# Patient Record
Sex: Female | Born: 1945 | Race: White | Hispanic: No | Smoking: Current every day smoker
Health system: Southern US, Community
[De-identification: ages and names within clinical notes are randomized; demographics above are authoritative.]

## PROBLEM LIST (undated history)

## (undated) DIAGNOSIS — Z87891 Personal history of nicotine dependence: Secondary | ICD-10-CM

## (undated) DIAGNOSIS — Z860100 Personal history of colon polyps, unspecified: Secondary | ICD-10-CM

## (undated) DIAGNOSIS — J439 Emphysema, unspecified: Secondary | ICD-10-CM

## (undated) DIAGNOSIS — J40 Bronchitis, not specified as acute or chronic: Secondary | ICD-10-CM

## (undated) DIAGNOSIS — R920 Mammographic microcalcification found on diagnostic imaging of breast: Secondary | ICD-10-CM

## (undated) DIAGNOSIS — K279 Peptic ulcer, site unspecified, unspecified as acute or chronic, without hemorrhage or perforation: Secondary | ICD-10-CM

## (undated) DIAGNOSIS — C4491 Basal cell carcinoma of skin, unspecified: Secondary | ICD-10-CM

## (undated) DIAGNOSIS — Z9221 Personal history of antineoplastic chemotherapy: Secondary | ICD-10-CM

## (undated) DIAGNOSIS — IMO0002 Reserved for concepts with insufficient information to code with codable children: Secondary | ICD-10-CM

## (undated) DIAGNOSIS — J449 Chronic obstructive pulmonary disease, unspecified: Secondary | ICD-10-CM

## (undated) DIAGNOSIS — K621 Rectal polyp: Secondary | ICD-10-CM

## (undated) DIAGNOSIS — C50912 Malignant neoplasm of unspecified site of left female breast: Secondary | ICD-10-CM

## (undated) DIAGNOSIS — I714 Abdominal aortic aneurysm, without rupture, unspecified: Secondary | ICD-10-CM

## (undated) DIAGNOSIS — K219 Gastro-esophageal reflux disease without esophagitis: Secondary | ICD-10-CM

## (undated) DIAGNOSIS — R011 Cardiac murmur, unspecified: Secondary | ICD-10-CM

## (undated) DIAGNOSIS — R911 Solitary pulmonary nodule: Secondary | ICD-10-CM

## (undated) DIAGNOSIS — C50911 Malignant neoplasm of unspecified site of right female breast: Secondary | ICD-10-CM

## (undated) DIAGNOSIS — Z720 Tobacco use: Secondary | ICD-10-CM

## (undated) DIAGNOSIS — C3490 Malignant neoplasm of unspecified part of unspecified bronchus or lung: Secondary | ICD-10-CM

## (undated) DIAGNOSIS — M1990 Unspecified osteoarthritis, unspecified site: Secondary | ICD-10-CM

## (undated) DIAGNOSIS — L930 Discoid lupus erythematosus: Secondary | ICD-10-CM

## (undated) DIAGNOSIS — F410 Panic disorder [episodic paroxysmal anxiety] without agoraphobia: Secondary | ICD-10-CM

## (undated) DIAGNOSIS — R9431 Abnormal electrocardiogram [ECG] [EKG]: Secondary | ICD-10-CM

## (undated) DIAGNOSIS — E7800 Pure hypercholesterolemia, unspecified: Secondary | ICD-10-CM

## (undated) HISTORY — PX: COLONOSCOPY W/ BIOPSIES: SHX1374

## (undated) HISTORY — DX: Pure hypercholesterolemia, unspecified: E78.00

## (undated) HISTORY — DX: Personal history of nicotine dependence: Z87.891

## (undated) HISTORY — DX: Malignant neoplasm of unspecified part of unspecified bronchus or lung: C34.90

## (undated) HISTORY — DX: Reserved for concepts with insufficient information to code with codable children: IMO0002

## (undated) HISTORY — DX: Mammographic microcalcification found on diagnostic imaging of breast: R92.0

## (undated) HISTORY — DX: Tobacco use: Z72.0

## (undated) HISTORY — DX: Unspecified osteoarthritis, unspecified site: M19.90

## (undated) HISTORY — DX: Cardiac murmur, unspecified: R01.1

## (undated) HISTORY — DX: Solitary pulmonary nodule: R91.1

## (undated) HISTORY — DX: Basal cell carcinoma of skin, unspecified: C44.91

## (undated) HISTORY — PX: UPPER GI ENDOSCOPY: SHX6162

## (undated) HISTORY — DX: Abdominal aortic aneurysm, without rupture, unspecified: I71.40

## (undated) HISTORY — PX: MASTECTOMY: SHX3

## (undated) HISTORY — DX: Discoid lupus erythematosus: L93.0

## (undated) HISTORY — DX: Personal history of colonic polyps: Z86.010

## (undated) HISTORY — DX: Panic disorder (episodic paroxysmal anxiety): F41.0

## (undated) HISTORY — DX: Emphysema, unspecified: J43.9

## (undated) HISTORY — DX: Rectal polyp: K62.1

## (undated) HISTORY — DX: Personal history of colon polyps, unspecified: Z86.0100

## (undated) HISTORY — DX: Peptic ulcer, site unspecified, unspecified as acute or chronic, without hemorrhage or perforation: K27.9

## (undated) HISTORY — DX: Malignant neoplasm of unspecified site of right female breast: C50.911

## (undated) HISTORY — DX: Chronic obstructive pulmonary disease, unspecified: J44.9

## (undated) HISTORY — DX: Malignant neoplasm of unspecified site of left female breast: C50.912

---
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
## 1975-03-21 HISTORY — PX: VAGINAL HYSTERECTOMY: SUR661

## 1991-03-21 HISTORY — PX: SIMPLE MASTECTOMY: SHX2409

## 1992-03-20 DIAGNOSIS — Z9221 Personal history of antineoplastic chemotherapy: Secondary | ICD-10-CM

## 1992-03-20 DIAGNOSIS — C50911 Malignant neoplasm of unspecified site of right female breast: Secondary | ICD-10-CM

## 1992-03-20 HISTORY — DX: Malignant neoplasm of unspecified site of right female breast: C50.911

## 1992-03-20 HISTORY — DX: Personal history of antineoplastic chemotherapy: Z92.21

## 1994-03-20 HISTORY — PX: CARDIAC CATHETERIZATION: SHX172

## 1995-03-21 HISTORY — PX: TONSILLECTOMY: SUR1361

## 2003-03-21 DIAGNOSIS — R011 Cardiac murmur, unspecified: Secondary | ICD-10-CM

## 2003-03-21 DIAGNOSIS — M1990 Unspecified osteoarthritis, unspecified site: Secondary | ICD-10-CM

## 2003-03-21 DIAGNOSIS — L930 Discoid lupus erythematosus: Secondary | ICD-10-CM

## 2003-03-21 HISTORY — DX: Discoid lupus erythematosus: L93.0

## 2003-03-21 HISTORY — DX: Unspecified osteoarthritis, unspecified site: M19.90

## 2003-03-21 HISTORY — DX: Cardiac murmur, unspecified: R01.1

## 2004-02-01 ENCOUNTER — Ambulatory Visit: Payer: Self-pay | Admitting: General Surgery

## 2004-02-29 ENCOUNTER — Ambulatory Visit: Payer: Self-pay | Admitting: General Surgery

## 2004-07-04 ENCOUNTER — Ambulatory Visit: Payer: Self-pay | Admitting: General Surgery

## 2005-02-22 ENCOUNTER — Ambulatory Visit: Payer: Self-pay | Admitting: Internal Medicine

## 2005-07-24 ENCOUNTER — Ambulatory Visit: Payer: Self-pay | Admitting: General Surgery

## 2006-03-20 DIAGNOSIS — K621 Rectal polyp: Secondary | ICD-10-CM

## 2006-03-20 HISTORY — DX: Rectal polyp: K62.1

## 2006-09-19 ENCOUNTER — Ambulatory Visit: Payer: Self-pay | Admitting: General Surgery

## 2006-10-19 ENCOUNTER — Ambulatory Visit: Payer: Self-pay | Admitting: Oncology

## 2006-11-09 ENCOUNTER — Ambulatory Visit: Payer: Self-pay | Admitting: Oncology

## 2006-11-19 ENCOUNTER — Ambulatory Visit: Payer: Self-pay | Admitting: Oncology

## 2007-04-04 ENCOUNTER — Ambulatory Visit: Payer: Self-pay | Admitting: General Surgery

## 2007-10-30 ENCOUNTER — Ambulatory Visit: Payer: Self-pay | Admitting: General Surgery

## 2007-11-08 ENCOUNTER — Ambulatory Visit: Payer: Self-pay | Admitting: Oncology

## 2007-11-19 ENCOUNTER — Ambulatory Visit: Payer: Self-pay | Admitting: Oncology

## 2008-11-03 ENCOUNTER — Ambulatory Visit: Payer: Self-pay | Admitting: General Surgery

## 2008-11-17 ENCOUNTER — Ambulatory Visit: Payer: Self-pay | Admitting: General Surgery

## 2009-03-20 DIAGNOSIS — C50912 Malignant neoplasm of unspecified site of left female breast: Secondary | ICD-10-CM

## 2009-03-20 DIAGNOSIS — R920 Mammographic microcalcification found on diagnostic imaging of breast: Secondary | ICD-10-CM

## 2009-03-20 HISTORY — DX: Mammographic microcalcification found on diagnostic imaging of breast: R92.0

## 2009-03-20 HISTORY — PX: SIMPLE MASTECTOMY: SHX2409

## 2009-03-20 HISTORY — DX: Malignant neoplasm of unspecified site of left female breast: C50.912

## 2009-03-20 HISTORY — PX: APPENDECTOMY: SHX54

## 2009-04-16 ENCOUNTER — Ambulatory Visit: Payer: Self-pay | Admitting: General Practice

## 2009-06-24 LAB — PULMONARY FUNCTION TEST

## 2009-12-09 ENCOUNTER — Ambulatory Visit: Payer: Self-pay | Admitting: General Surgery

## 2009-12-20 ENCOUNTER — Ambulatory Visit: Payer: Self-pay | Admitting: General Surgery

## 2009-12-27 HISTORY — PX: BREAST BIOPSY: SHX20

## 2010-01-17 ENCOUNTER — Ambulatory Visit: Payer: Self-pay | Admitting: General Surgery

## 2010-01-24 LAB — PATHOLOGY REPORT

## 2010-01-31 ENCOUNTER — Ambulatory Visit: Payer: Self-pay | Admitting: General Surgery

## 2010-02-07 ENCOUNTER — Ambulatory Visit: Payer: Self-pay | Admitting: General Surgery

## 2010-03-20 DIAGNOSIS — E7800 Pure hypercholesterolemia, unspecified: Secondary | ICD-10-CM

## 2010-03-20 HISTORY — DX: Pure hypercholesterolemia, unspecified: E78.00

## 2010-08-26 ENCOUNTER — Ambulatory Visit: Payer: Self-pay | Admitting: General Surgery

## 2010-08-30 LAB — PATHOLOGY REPORT

## 2012-03-28 ENCOUNTER — Encounter: Payer: Self-pay | Admitting: General Surgery

## 2012-03-29 DIAGNOSIS — C50919 Malignant neoplasm of unspecified site of unspecified female breast: Secondary | ICD-10-CM

## 2012-03-29 DIAGNOSIS — Z86010 Personal history of colonic polyps: Secondary | ICD-10-CM

## 2012-03-29 DIAGNOSIS — Z85828 Personal history of other malignant neoplasm of skin: Secondary | ICD-10-CM | POA: Insufficient documentation

## 2012-04-02 ENCOUNTER — Ambulatory Visit: Payer: Self-pay | Admitting: General Surgery

## 2012-04-03 DIAGNOSIS — Z853 Personal history of malignant neoplasm of breast: Secondary | ICD-10-CM

## 2012-04-16 ENCOUNTER — Ambulatory Visit: Payer: Self-pay | Admitting: General Surgery

## 2012-09-03 ENCOUNTER — Encounter: Payer: Self-pay | Admitting: *Deleted

## 2013-02-05 ENCOUNTER — Ambulatory Visit: Payer: Self-pay | Admitting: General Surgery

## 2013-03-20 DIAGNOSIS — C4491 Basal cell carcinoma of skin, unspecified: Secondary | ICD-10-CM

## 2013-03-20 DIAGNOSIS — Z85828 Personal history of other malignant neoplasm of skin: Secondary | ICD-10-CM

## 2013-03-20 HISTORY — DX: Basal cell carcinoma of skin, unspecified: C44.91

## 2013-03-20 HISTORY — DX: Personal history of other malignant neoplasm of skin: Z85.828

## 2013-03-26 ENCOUNTER — Encounter: Payer: Self-pay | Admitting: *Deleted

## 2013-04-16 ENCOUNTER — Ambulatory Visit (INDEPENDENT_AMBULATORY_CARE_PROVIDER_SITE_OTHER): Payer: No Typology Code available for payment source | Admitting: Adult Health

## 2013-04-16 ENCOUNTER — Encounter (INDEPENDENT_AMBULATORY_CARE_PROVIDER_SITE_OTHER): Payer: Self-pay

## 2013-04-16 ENCOUNTER — Encounter: Payer: Self-pay | Admitting: Adult Health

## 2013-04-16 VITALS — BP 110/62 | HR 74 | Temp 97.9°F | Resp 12 | Ht 64.5 in | Wt 145.5 lb

## 2013-04-16 DIAGNOSIS — M329 Systemic lupus erythematosus, unspecified: Secondary | ICD-10-CM | POA: Insufficient documentation

## 2013-04-16 DIAGNOSIS — F411 Generalized anxiety disorder: Secondary | ICD-10-CM | POA: Insufficient documentation

## 2013-04-16 NOTE — Assessment & Plan Note (Signed)
Uses Xanax as needed. She currently does not need any refills. Patient has agreed to be evaluated every 6 months or sooner for her anxiety.

## 2013-04-16 NOTE — Assessment & Plan Note (Addendum)
Reports followed by Dr. 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 Request records.

## 2013-04-16 NOTE — Patient Instructions (Signed)
   Thank you for choosing [facility] for your health care needs.  I will request your records from your previous doctors.  Remember to let your pharmacy know to send me any request for refills.  Call if you have any concerns.

## 2013-04-16 NOTE — Progress Notes (Signed)
Subjective:    Patient ID: Maureen [name] Hidden, female    DOB: Oct 22, 1945, 68 y.o.   MRN: [id number]  HPI  Patient is a 68 year old female who presents to clinic to establish care. Previously followed by Dr. Arline Asp and then saw Dr. Loney Hering for short periods of time. Has had PFT with Dr. Raul Del. Followed by Dr. Evorn Gong for Lupus. Hx of bilateral breast cancer s/p sgy by Dr. Bary Castilla. Request records from non /[facility] providers. She reports ongoing problems with anxiety although these are well controlled. Has been on xanax > 20 years. Takes only as needed. Feeling well overall. Had physical exam ~ 4-5 month ago.    Past Medical History  Diagnosis Date  . Lupus 2005  . Personal history of tobacco use, presenting hazards to health   . Heart murmur 2005  . Arthritis 2005  . Rectal polyp 2008    villous adenoma  . Mammographic microcalcification 2011  . Hypercholesterolemia 2012  . 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 This was a histologic grade 2 tumor measuring just under 1 cm in diameter. No invasive cancer was identified. 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  . Personal history of colonic polyps     Colon polyps  . Ulcer   . Panic attack      Past Surgical History  Procedure Laterality Date  . Colonoscopy w/ biopsies  3299,2426    Dr. Bary Castilla  . Breast biopsy  12/27/2009    right breast  . Upper gi endoscopy   2012    Upper endoscopy completed in 2012 showed mild reflux changes without evidence of Barrett's epithelial changes  . Tonsillectomy  1997  . Vaginal hysterectomy  1977  . Cardiac catheterization  1994  . Simple mastectomy  1993    right  . Simple mastectomy  2011    left  . Appendectomy  2011     Family History  Problem Relation Age of Onset  . Cancer Maternal  Aunt     breast cancer  . Cancer Maternal Grandmother     breast cancer     History   Social History  . Marital Status: Legally Separated    Spouse Name: N/A    Number of Children: N/A  . Years of Education: N/A   Occupational History  . [employer]   Social History Main Topics  . Smoking status: Current Every Day Smoker -- 1.00 packs/day for 40 years    Types: Cigarettes  . Smokeless tobacco: Never Used  . Alcohol Use: .5 - 1 oz/week    1-2 drink(s) per week  . Drug Use: No  . Sexual Activity: Not on file   Other Topics Concern  . Not on file   Social History Narrative   [name] grew up in [place]. She has two adult daughters. She lives in her home. Her youngest daughter and grandchildren live with her. She has two dogs. 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 She loves watching her grandson play basketball. She also loves gardening and being outdoors and working in the yard. She is getting ready to build a deck on her home.          Review of Systems  Constitutional:  Negative.   HENT: Negative.   Eyes: Negative.   Respiratory: Negative.   Cardiovascular: Negative.   Gastrointestinal: Negative.   Endocrine: Negative.   Genitourinary: Negative.   Musculoskeletal: Negative.   Skin: Negative.   Allergic/Immunologic: Negative.   Neurological: Negative.   Hematological: Negative.   Psychiatric/Behavioral: Negative.        Objective:   Physical Exam  Constitutional: She is oriented to person, place, and time. She appears well-developed and well-nourished. No distress.  HENT:  Head: Normocephalic and atraumatic.  Right Ear: External ear normal.  Left Ear: External ear normal.  Nose: Nose normal.  Mouth/Throat: Oropharynx is clear and moist.  Eyes: Conjunctivae and EOM are normal. Pupils are equal, round, and reactive to light.  Neck: Normal range of motion. Neck supple. No tracheal  deviation present. No thyromegaly present.  Cardiovascular: Normal rate, regular rhythm, normal heart sounds and intact distal pulses.  Exam reveals no gallop and no friction rub.   No murmur heard. Pulmonary/Chest: Effort normal and breath sounds normal. No respiratory distress. She has no wheezes. She has no rales.  Abdominal: Soft. Bowel sounds are normal. She exhibits no distension and no mass. There is no tenderness. There is no rebound and no guarding.  Musculoskeletal: Normal range of motion. She exhibits no edema and no tenderness.  Lymphadenopathy:    She has no cervical adenopathy.  Neurological: She is alert and oriented to person, place, and time. She has normal reflexes. No cranial nerve deficit. Coordination normal.  Skin: Skin is warm and dry.  Psychiatric: She has a normal mood and affect. Her behavior is normal. Judgment and thought content normal.    BP 110/62  Pulse 74  Temp(Src) 97.9 F (36.6 C) (Oral)  Resp 12  Ht 5' 4.5" (1.638 m)  Wt 145 lb 8 oz (65.998 kg)  BMI 24.60 kg/m2  SpO2 95%      Assessment & Plan:

## 2013-04-16 NOTE — Progress Notes (Signed)
Pre visit review using our clinic review tool, if applicable. No additional management support is needed unless otherwise documented below in the visit note. 

## 2013-04-17 ENCOUNTER — Telehealth: Payer: Self-pay | Admitting: Internal Medicine

## 2013-04-17 NOTE — Telephone Encounter (Signed)
Relevant patient education assigned to patient using Emmi. ° °

## 2013-04-21 ENCOUNTER — Encounter: Payer: Self-pay | Admitting: Adult Health

## 2013-06-09 ENCOUNTER — Encounter: Payer: Self-pay | Admitting: General Surgery

## 2013-06-09 ENCOUNTER — Ambulatory Visit (INDEPENDENT_AMBULATORY_CARE_PROVIDER_SITE_OTHER): Payer: No Typology Code available for payment source | Admitting: General Surgery

## 2013-06-09 VITALS — BP 136/72 | HR 80 | Resp 12 | Ht 64.0 in | Wt 143.0 lb

## 2013-06-09 DIAGNOSIS — Z853 Personal history of malignant neoplasm of breast: Secondary | ICD-10-CM

## 2013-06-09 NOTE — Progress Notes (Signed)
Patient ID: Maureen [name], female   DOB: 1945-07-09, 68 y.o.   MRN: [id number]  Chief Complaint  Patient presents with  . Follow-up    one year history of breast cancer    HPI Maureen [name] is a 68 y.o. female. Here today for her annual followup of her breast cancer. Patient states she is doing well. No new breast problems at this time.  HPI  Past Medical History  Diagnosis Date  . Discoid lupus 2005  . Personal history of tobacco use, presenting hazards to health   . Heart murmur 2005  . Arthritis 2005  . Rectal polyp 2008    villous adenoma; last colonoscopy 1/09 WNL  . Mammographic microcalcification 2011  . Hypercholesterolemia 2012  . 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 This was a histologic grade 2 tumor measuring just under 1 cm in diameter. No invasive cancer was identified. 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  . Personal history of colonic polyps     Colon polyps  . Ulcer   . Panic attack   . PUD (peptic ulcer disease)     Last EGD 1/09    Past Surgical History  Procedure Laterality Date  . Colonoscopy w/ biopsies  7867,6720    Dr. Bary Castilla  . Breast biopsy  12/27/2009    right breast  . Upper gi endoscopy   2012    Upper endoscopy completed in 2012 showed mild reflux changes without evidence of Barrett's epithelial changes  . Tonsillectomy  1997  . Vaginal hysterectomy  1977  . Cardiac catheterization  1994  . Simple mastectomy  1993    right  . Simple mastectomy  2011    left  . Appendectomy  2011    Family History  Problem Relation Age of Onset  . Cancer Maternal Aunt     breast cancer  . Cancer Maternal Grandmother     breast cancer    Social History History  Substance Use Topics  . Smoking status: Current Every Day Smoker -- 1.00  packs/day for 40 years    Types: Cigarettes  . Smokeless tobacco: Never Used  . Alcohol Use: .5 - 1 oz/week    1-2 drink(s) per week    Allergies  Allergen Reactions  . Codeine Itching  . Sulfa Antibiotics Hives  . Sulfinpyrazone   . Erythromycin Rash    Current Outpatient Prescriptions  Medication Sig Dispense Refill  . albuterol (VENTOLIN HFA) 108 (90 BASE) MCG/ACT inhaler Inhale 2 puffs into the lungs every 6 (six) hours as needed.      . ALPRAZolam (XANAX) 0.5 MG tablet Take 0.5 mg by mouth 3 (three) times daily as needed.      . budesonide-formoterol (SYMBICORT) 160-4.5 MCG/ACT inhaler Inhale 2 puffs into the lungs 2 (two) times daily.      . clobetasol cream (TEMOVATE) 9.47 % Apply 1 application topically daily.      . fluticasone (FLONASE) 50 MCG/ACT nasal spray Place 2 sprays into both nostrils daily.      . Homeopathic Products (ZICAM ALLERGY RELIEF NA) Place into the nose.      Marland Kitchen omeprazole (PRILOSEC) 20 MG capsule Take 20 mg by mouth daily.        No current facility-administered medications for this visit.  Review of Systems Review of Systems  Constitutional: Negative.   Respiratory: Negative.   Cardiovascular: Negative.     Blood pressure 136/72, pulse 80, resp. rate 12, height 5\' 4"  (1.626 m), weight 143 lb (64.864 kg).  Physical Exam Physical Exam  Constitutional: She is oriented to person, place, and time. She appears well-developed and well-nourished.  Eyes: Conjunctivae are normal.  Neck: Neck supple.  Cardiovascular: Normal rate, regular rhythm and normal heart sounds.   Pulmonary/Chest: Effort normal and breath sounds normal.  Bilateral mastectomy sites well healed.   Neurological: She is alert and oriented to person, place, and time.  Skin: Skin is warm and dry.    Data Reviewed 2009 upper endoscopy and colonoscopy reports. January 2014 abdominal ultrasound.  Assessment    Benign chest wall exam. No evidence of recurrent disease.      Plan    Wall history of intermittent upper abdominal pain with negative evaluations. The patient had requested a prescription for paregoric. I did not see any evidence that this was indicated.  She has been asked to return in one year for repeat exam.   The patient had an ER/PR-positive DCIS, but I should undergone bilateral mastectomies there was not felt to be any benefit from adjuvant hormonal therapy.        Maureen [name] 06/10/2013, 6:38 AM

## 2013-06-09 NOTE — Patient Instructions (Signed)
Patient to return in one year. 

## 2013-06-20 ENCOUNTER — Other Ambulatory Visit: Payer: Self-pay | Admitting: Adult Health

## 2013-06-23 NOTE — Telephone Encounter (Signed)
Faxed Rx to pharmacy  

## 2013-07-16 ENCOUNTER — Ambulatory Visit (INDEPENDENT_AMBULATORY_CARE_PROVIDER_SITE_OTHER): Payer: No Typology Code available for payment source | Admitting: Adult Health

## 2013-07-16 ENCOUNTER — Encounter: Payer: Self-pay | Admitting: Adult Health

## 2013-07-16 ENCOUNTER — Encounter (INDEPENDENT_AMBULATORY_CARE_PROVIDER_SITE_OTHER): Payer: Self-pay

## 2013-07-16 VITALS — BP 128/70 | HR 76 | Temp 97.9°F | Resp 12 | Wt 145.8 lb

## 2013-07-16 DIAGNOSIS — F17200 Nicotine dependence, unspecified, uncomplicated: Secondary | ICD-10-CM

## 2013-07-16 DIAGNOSIS — F411 Generalized anxiety disorder: Secondary | ICD-10-CM

## 2013-07-16 DIAGNOSIS — Z7189 Other specified counseling: Secondary | ICD-10-CM

## 2013-07-16 DIAGNOSIS — Z716 Tobacco abuse counseling: Secondary | ICD-10-CM

## 2013-07-16 NOTE — Progress Notes (Signed)
Pre visit review using our clinic review tool, if applicable. No additional management support is needed unless otherwise documented below in the visit note. 

## 2013-07-16 NOTE — Progress Notes (Signed)
   Subjective:    Patient ID: Maureen [name], female    DOB: 07-13-1945, 68 y.o.   MRN: [id number]  HPI Pt is a pleasant 68 y/o female who presents for follow up of anxiety, ongoing tobacco abuse. Pertaining to her anxiety, she reports that she is well controlled with the xanax. Takes medication as needed tid. Pertaining to her tobacco abuse, pt is smoking < 1 ppd. She has been to a hypnotist in the past and quit smoking; however, she was supposed to return but did not. She began smoking again. Plans on going back to see the hypnotist.  Medicare wellness exam due in October. Prescription provided for labs prior to her physical. She will schedule appointment prior to leaving the office.  Past Medical History  Diagnosis Date  . Discoid lupus 2005  . Personal history of tobacco use, presenting hazards to health   . Heart murmur 2005  . Arthritis 2005  . Rectal polyp 2008    villous adenoma; last colonoscopy 1/09 WNL  . Mammographic microcalcification 2011  . Hypercholesterolemia 2012  . 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 This was a histologic grade 2 tumor measuring just under 1 cm in diameter. No invasive cancer was identified. 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  . Personal history of colonic polyps     Colon polyps  . Ulcer   . Panic attack   . PUD (peptic ulcer disease)     Last EGD 1/09    Review of Systems  Constitutional: Negative.   HENT: Negative.   Eyes: Negative.   Respiratory: Negative.   Cardiovascular: Negative.   Gastrointestinal: Negative.   Endocrine: Negative.   Genitourinary: Negative.   Musculoskeletal: Negative.   Skin: Negative.   Allergic/Immunologic: Negative.   Neurological: Negative.   Hematological: Negative.   Psychiatric/Behavioral: Negative.   Nervous/anxious: controlled with xanax prn.        Objective:   Physical Exam  Constitutional: She is oriented to person, place, and time. No distress.  HENT:  Head: Normocephalic and atraumatic.  Eyes: Conjunctivae and EOM are normal.  Neck: Normal range of motion. Neck supple.  Cardiovascular: Normal rate, regular rhythm, normal heart sounds and intact distal pulses.  Exam reveals no gallop and no friction rub.   No murmur heard. Pulmonary/Chest: Effort normal and breath sounds normal. No respiratory distress. She has no wheezes. She has no rales.  Musculoskeletal: Normal range of motion.  Neurological: She is alert and oriented to person, place, and time. She has normal reflexes. Coordination normal.  Skin: Skin is warm and dry.  Psychiatric: She has a normal mood and affect. Her behavior is normal. Judgment and thought content normal.       Assessment & Plan:   1. Generalized anxiety disorder Well controlled with Xanax. She takes this 3 times a day as needed. Reports that some days she only needs one. No refills required at this time. Continue to follow  2. Tobacco abuse counseling Patient is smoking less than one pack per day. She plans on seeing a hypnotist to help her quit. She has done this in the past although she did not return for followup. Encouraged her to quit smoking.

## 2013-07-16 NOTE — Patient Instructions (Signed)
  Please return for your yearly physical in October. I have provided you a prescription so that you can have labs drawn prior to coming for your physical exam.  I encourage you to quit smoking as this will improve her overall health.

## 2013-08-13 ENCOUNTER — Encounter: Payer: Self-pay | Admitting: Adult Health

## 2014-01-02 LAB — CBC AND DIFFERENTIAL
HCT: 41 % (ref 36–46)
Hemoglobin: 13.6 g/dL (ref 12.0–16.0)
NEUTROS ABS: 3 /uL
Platelets: 184 10*3/uL (ref 150–399)
WBC: 5.7 10^3/mL

## 2014-01-02 LAB — HEPATIC FUNCTION PANEL
ALT: 27 U/L (ref 7–35)
AST: 17 U/L (ref 13–35)
Alkaline Phosphatase: 102 U/L (ref 25–125)
Bilirubin, Total: 0.3 mg/dL

## 2014-01-02 LAB — LIPID PANEL
Cholesterol: 253 mg/dL — AB (ref 0–200)
HDL: 43 mg/dL (ref 35–70)
LDL CALC: 177 mg/dL
TRIGLYCERIDES: 163 mg/dL — AB (ref 40–160)

## 2014-01-02 LAB — BASIC METABOLIC PANEL
BUN: 13 mg/dL (ref 4–21)
Creatinine: 1 mg/dL (ref 0.5–1.1)
Glucose: 92 mg/dL
POTASSIUM: 4.3 mmol/L (ref 3.4–5.3)
SODIUM: 140 mmol/L (ref 137–147)

## 2014-01-02 LAB — TSH: TSH: 1.07 u[IU]/mL (ref 0.41–5.90)

## 2014-01-05 ENCOUNTER — Telehealth: Payer: Self-pay | Admitting: Internal Medicine

## 2014-01-05 NOTE — Telephone Encounter (Signed)
Recent labs show elevated cholesterol. She will need a 53min follow up visit to establish care and address this.

## 2014-01-06 NOTE — Telephone Encounter (Signed)
Left vm for pt to return my call.  

## 2014-01-19 ENCOUNTER — Encounter: Payer: No Typology Code available for payment source | Admitting: Adult Health

## 2014-01-19 ENCOUNTER — Encounter: Payer: Self-pay | Admitting: Adult Health

## 2014-01-27 ENCOUNTER — Encounter: Payer: Self-pay | Admitting: *Deleted

## 2014-01-27 NOTE — Telephone Encounter (Signed)
Unable to reach patient, mailed letter to call office to schedule appt with new NP to discuss labs.

## 2014-02-10 ENCOUNTER — Telehealth: Payer: Self-pay | Admitting: *Deleted

## 2014-02-10 ENCOUNTER — Other Ambulatory Visit: Payer: Self-pay | Admitting: *Deleted

## 2014-02-10 MED ORDER — ALPRAZOLAM 0.5 MG PO TABS: 0.5000 mg | ORAL_TABLET | Freq: Three times a day (TID) | ORAL | Status: DC | PRN

## 2014-02-10 NOTE — Telephone Encounter (Signed)
Patient notified as requested. 

## 2014-02-10 NOTE — Telephone Encounter (Signed)
Refill one 30 days only.  Needs OV with carrie prior to more refills

## 2014-02-10 NOTE — Telephone Encounter (Signed)
Refill request former Raquel pt  Last fill: 09.19.2015 Alprazolam 0.5mg  tablet

## 2014-02-10 NOTE — Telephone Encounter (Signed)
Read below

## 2014-02-18 ENCOUNTER — Encounter: Payer: Self-pay | Admitting: Nurse Practitioner

## 2014-02-18 ENCOUNTER — Ambulatory Visit (INDEPENDENT_AMBULATORY_CARE_PROVIDER_SITE_OTHER): Payer: No Typology Code available for payment source | Admitting: Nurse Practitioner

## 2014-02-18 VITALS — BP 112/62 | HR 88 | Temp 97.9°F | Resp 14 | Ht 63.5 in | Wt 147.2 lb

## 2014-02-18 DIAGNOSIS — Z0000 Encounter for general adult medical examination without abnormal findings: Secondary | ICD-10-CM

## 2014-02-18 DIAGNOSIS — Z23 Encounter for immunization: Secondary | ICD-10-CM

## 2014-02-18 NOTE — Assessment & Plan Note (Signed)
Gave pneumococcal vaccination- 13 valent at today's visit. This was a [employer] visit as requested by Charolette Forward, NP. Other items on list were up to date. We discussed specifically low cholesterol diet, smoking cessation, re-checking Vitamin D and Lipids again in Jan. She will follow up for anxiety, lipids, and vitamin D levels in Jan.

## 2014-02-18 NOTE — Patient Instructions (Addendum)
We will follow up on your Vitamin D Level in Jan. As well as your cholesterol.  Please see below for a cholesterol diet and we will see if this is helpful and we can stay off medication.  Follow up in January.   Fat and Cholesterol Control Diet Fat and cholesterol levels in your blood and organs are influenced by your diet. High levels of fat and cholesterol may lead to diseases of the heart, small and large blood vessels, gallbladder, liver, and pancreas. CONTROLLING FAT AND CHOLESTEROL WITH DIET Although exercise and lifestyle factors are important, your diet is key. That is because certain foods are known to raise cholesterol and others to lower it. The goal is to balance foods for their effect on cholesterol and more importantly, to replace saturated and trans fat with other types of fat, such as monounsaturated fat, polyunsaturated fat, and omega-3 fatty acids. On average, a person should consume no more than 15 to 17 g of saturated fat daily. Saturated and trans fats are considered "bad" fats, and they will raise LDL cholesterol. Saturated fats are primarily found in animal products such as meats, butter, and cream. However, that does not mean you need to give up all your favorite foods. Today, there are good tasting, low-fat, low-cholesterol substitutes for most of the things you like to eat. Choose low-fat or nonfat alternatives. Choose round or loin cuts of red meat. These types of cuts are lowest in fat and cholesterol. Chicken (without the skin), fish, veal, and ground [place] breast are great choices. Eliminate fatty meats, such as hot dogs and salami. Even shellfish have little or no saturated fat. Have a 3 oz (85 g) portion when you eat lean meat, poultry, or fish. Trans fats are also called "partially hydrogenated oils." They are oils that have been scientifically manipulated so that they are solid at room temperature resulting in a longer shelf life and improved taste and texture of foods in  which they are added. Trans fats are found in stick margarine, some tub margarines, cookies, crackers, and baked goods.  When baking and cooking, oils are a great substitute for butter. The monounsaturated oils are especially beneficial since it is believed they lower LDL and raise HDL. The oils you should avoid entirely are saturated tropical oils, such as coconut and palm.  Remember to eat a lot from food groups that are naturally free of saturated and trans fat, including fish, fruit, vegetables, beans, grains (barley, rice, couscous, bulgur wheat), and pasta (without cream sauces).  IDENTIFYING FOODS THAT LOWER FAT AND CHOLESTEROL  Soluble fiber may lower your cholesterol. This type of fiber is found in fruits such as apples, vegetables such as broccoli, potatoes, and carrots, legumes such as beans, peas, and lentils, and grains such as barley. Foods fortified with plant sterols (phytosterol) may also lower cholesterol. You should eat at least 2 g per day of these foods for a cholesterol lowering effect.  Read package labels to identify low-saturated fats, trans fat free, and low-fat foods at the supermarket. Select cheeses that have only 2 to 3 g saturated fat per ounce. Use a heart-healthy tub margarine that is free of trans fats or partially hydrogenated oil. When buying baked goods (cookies, crackers), avoid partially hydrogenated oils. Breads and muffins should be made from whole grains (whole-wheat or whole oat flour, instead of "flour" or "enriched flour"). Buy non-creamy canned soups with reduced salt and no added fats.  FOOD PREPARATION TECHNIQUES  Never deep-fry. If you must  fry, either stir-fry, which uses very little fat, or use non-stick cooking sprays. When possible, broil, bake, or roast meats, and steam vegetables. Instead of putting butter or margarine on vegetables, use lemon and herbs, applesauce, and cinnamon (for squash and sweet potatoes). Use nonfat yogurt, salsa, and low-fat  dressings for salads.  LOW-SATURATED FAT / LOW-FAT FOOD SUBSTITUTES Meats / Saturated Fat (g)  Avoid: Steak, marbled (3 oz/85 g) / 11 g  Choose: Steak, lean (3 oz/85 g) / 4 g  Avoid: Hamburger (3 oz/85 g) / 7 g  Choose: Hamburger, lean (3 oz/85 g) / 5 g  Avoid: Ham (3 oz/85 g) / 6 g  Choose: Ham, lean cut (3 oz/85 g) / 2.4 g  Avoid: Chicken, with skin, dark meat (3 oz/85 g) / 4 g  Choose: Chicken, skin removed, dark meat (3 oz/85 g) / 2 g  Avoid: Chicken, with skin, light meat (3 oz/85 g) / 2.5 g  Choose: Chicken, skin removed, light meat (3 oz/85 g) / 1 g Dairy / Saturated Fat (g)  Avoid: Whole milk (1 cup) / 5 g  Choose: Low-fat milk, 2% (1 cup) / 3 g  Choose: Low-fat milk, 1% (1 cup) / 1.5 g  Choose: Skim milk (1 cup) / 0.3 g  Avoid: Hard cheese (1 oz/28 g) / 6 g  Choose: Skim milk cheese (1 oz/28 g) / 2 to 3 g  Avoid: Cottage cheese, 4% fat (1 cup) / 6.5 g  Choose: Low-fat cottage cheese, 1% fat (1 cup) / 1.5 g  Avoid: Ice cream (1 cup) / 9 g  Choose: Sherbet (1 cup) / 2.5 g  Choose: Nonfat frozen yogurt (1 cup) / 0.3 g  Choose: Frozen fruit bar / trace  Avoid: Whipped cream (1 tbs) / 3.5 g  Choose: Nondairy whipped topping (1 tbs) / 1 g Condiments / Saturated Fat (g)  Avoid: Mayonnaise (1 tbs) / 2 g  Choose: Low-fat mayonnaise (1 tbs) / 1 g  Avoid: Butter (1 tbs) / 7 g  Choose: Extra light margarine (1 tbs) / 1 g  Avoid: Coconut oil (1 tbs) / 11.8 g  Choose: Olive oil (1 tbs) / 1.8 g  Choose: Corn oil (1 tbs) / 1.7 g  Choose: Safflower oil (1 tbs) / 1.2 g  Choose: Sunflower oil (1 tbs) / 1.4 g  Choose: Soybean oil (1 tbs) / 2.4 g  Choose: Canola oil (1 tbs) / 1 g Document Released: 03/06/2005 Document Revised: 07/01/2012 Document Reviewed: 06/04/2013 [employer] Patient Information 2015 [place], [place]. This information is not intended to replace advice given to you by your health care provider. Make sure you discuss any questions you have  with your health care provider.

## 2014-02-18 NOTE — Progress Notes (Signed)
Subjective:    Patient ID: Maureen [name], female    DOB: 1945/09/15, 68 y.o.   MRN: [id number]  HPI  Maureen [name] is a former Charolette Forward, NP patient here for her [employer] Visit.  Health Screenings  Mammogram- Bilateral Mastectomy - 1993 and 2011 Sees Dr. Bary Castilla Pap Smear- No exam since 2000 Colonoscopy- Dr. Bary Castilla- endoscopy due this year. Colonoscopy up to date. PSA- N/A Bone Density- Dr. Arline Asp - few years ago, good report Glaucoma- Appointment in Jan. Denies glaucoma.  Hearing - No testing needed at this time.  Hemoglobin A1C- Unsure if done with labs Cholesterol- High cholesterol. Can not see lab results. Will advise on diet and re-check in January.  Vitamin D- lab shows low vitamin D. 2,000 mg daily every 2-3 days. Re-check in Jan. 2016.   Social  Alcohol intake- once a month. Usually one beer Smoking history- 1 ppd x 50 yrs Smokers in home? - Daughter Illicit drug use? Denies Exercise- Denies Diet- No specific diet. Cooks at home often Sexually Active- Not since husband passed in 2000 Multiple Partners- N/A  Safety  Patient feesl safe at home. - Yes Patient does have smoke detectors at home- Each room and functional Patient does wear sunscreen or protective clothing when in direct sunlight- Does after Basal cell Cancer removed from face by Dr. Marsa Aris at [facility] in Sept.  Patient does wear seat belt when driving or riding with others.- *Most of the time. Due to Mastectomies there are moments when it is uncomfortable. Discussed having modification to seat belt to be able to comply with safety standards.   Activities of Daily Living Patient can do their own household chores. Denies needing assistance with: driving, feeding themselves, getting from bed to chair, getting to the toilet, bathing/showering, dressing, managing money, climbing flight of stairs, or preparing meals.   Depression Screen Patient denies losing interest in daily life, feeling hopeless,  or crying easily over simple problems.  Fall Screen Patient denies being afraid of falling or falling in the last year.  Memory Screen Patient denies problems with memory, misplacing items, and is able to balance checkbook/bank accounts.  Patient is alert, normal appearance, oriented to person/place/and time. Correctly identified the president of the [place], recall of 3 objects, and performing simple calculations.  Patient displays appropriate judgement and can read correct time from watch face.   Immunizations The following Immunizations are up to date: Influenza, pneumonia, and tetanus.   Other Providers Include: Dr. Evorn Gong Dermatologist- Lupus and other skin issues Dr. Marsa Aris with [facility] Removed facial basal cell right side of nose in Sept. Will see in him March for follow up.  All blood work done through [employer].    Review of Systems No ROS- Annual Wellness Visit    Objective:   Physical Exam No PE- Annual Wellness Visit.      Assessment & Plan:  This is a routine wellness examination for this patient.  I reviewed all health maintenance protocols including mammography, colonoscopy, bone density. Needed referrals placed. Age and diagnosis appropriate screening labs were ordered.  Her immunization history was reviewed and appropriate vaccinations were ordered.  Her current medications and allergies were reviewed and needed refills of her chronic medications were ordered if needed.  The plan for yearly health maintenance was discussed.     MEDICARE ATTESTATION I have personally reviewed:  The patient's medical and social history. The use of alcohol, tobacco and illicit drugs. The current medications and supplements. The patient's  function ability including ADLs, fall risks, home safety risks, cognitive, and hearing and visual impairment.   Diet and physical activities. Evaluation for depression and mood disorders.    The patient's weight, height, BMI and visual acuity have  been recorded in the chart.  I have made referrals, counseled and provided education to the patient based on review of the above.

## 2014-02-18 NOTE — Progress Notes (Signed)
Pre visit review using our clinic review tool, if applicable. No additional management support is needed unless otherwise documented below in the visit note. 

## 2014-02-19 ENCOUNTER — Encounter: Payer: Self-pay | Admitting: Adult Health

## 2014-02-24 ENCOUNTER — Encounter: Payer: Self-pay | Admitting: *Deleted

## 2014-03-03 ENCOUNTER — Encounter: Payer: Self-pay | Admitting: Internal Medicine

## 2014-03-10 ENCOUNTER — Encounter: Payer: No Typology Code available for payment source | Admitting: Nurse Practitioner

## 2014-04-08 ENCOUNTER — Ambulatory Visit: Payer: No Typology Code available for payment source | Admitting: Nurse Practitioner

## 2014-05-08 ENCOUNTER — Telehealth: Payer: Self-pay | Admitting: *Deleted

## 2014-05-08 NOTE — Telephone Encounter (Signed)
Scheduled pt for 2.22.16 at 8am

## 2014-05-08 NOTE — Telephone Encounter (Signed)
I asked her to return in January for medication visit for review and refills. She never made an appointment. She will need one before I can prescribe Xanax.

## 2014-05-08 NOTE — Telephone Encounter (Signed)
Fax from pharmacy requesting Alprazolam 0.5 mg.  Last OV 12.2.15.  Please advise refill

## 2014-05-11 ENCOUNTER — Encounter: Payer: Self-pay | Admitting: Nurse Practitioner

## 2014-05-11 ENCOUNTER — Ambulatory Visit (INDEPENDENT_AMBULATORY_CARE_PROVIDER_SITE_OTHER): Payer: No Typology Code available for payment source | Admitting: Nurse Practitioner

## 2014-05-11 VITALS — BP 116/62 | HR 84 | Temp 97.4°F | Resp 14 | Ht 63.5 in | Wt 143.0 lb

## 2014-05-11 DIAGNOSIS — F411 Generalized anxiety disorder: Secondary | ICD-10-CM

## 2014-05-11 DIAGNOSIS — E785 Hyperlipidemia, unspecified: Secondary | ICD-10-CM | POA: Insufficient documentation

## 2014-05-11 MED ORDER — ALPRAZOLAM 0.5 MG PO TABS: 0.5000 mg | ORAL_TABLET | Freq: Three times a day (TID) | ORAL | Status: DC | PRN

## 2014-05-11 NOTE — Assessment & Plan Note (Signed)
Discussed options for decreasing lipid levels. She states she eats what she wants and she hates taking medications. She wants to try to eat a lower cholesterol diet.

## 2014-05-11 NOTE — Assessment & Plan Note (Signed)
Pt was supposed to follow up in January for generalized anxiety and cholesterol being elevated. She states this was not told to her, despite the instructions being verbalized and written on her AVS in Dec. She ran out of xanax this weekend and was very upset today. She reports that she saw [facility] years past and the only medication to help her was xanax and did not have side effects. We discussed problems with potential for abuse and tolerance. She is aware of these and states she does not take them everyday, only when she needs them. Some days she takes up to 3 a day. CSC on file. Will follow. Script given to pt to take to pharmacy and she verbalized understanding.

## 2014-05-11 NOTE — Patient Instructions (Signed)
See you in December for your Wellness exam!

## 2014-05-11 NOTE — Progress Notes (Signed)
Pre visit review using our clinic review tool, if applicable. No additional management support is needed unless otherwise documented below in the visit note. 

## 2014-05-11 NOTE — Progress Notes (Signed)
Subjective:    Patient ID: Maureen [name], female    DOB: June 04, 1945, 69 y.o.   MRN: [id number]  HPI  [name] is a 69 yo female with a CC of needed refill on Xanax.  1) Generalized anxiety disorder-   Ran out of medication, good support system, raising three children. Chest tightness and heart racing. Last  Was seen at [facility] for these in the past. If she feels one coming on takes a xanax and it is helpful in relieving symptoms. Takes as needed. Sometimes she takes up to three times a day.   Review of Systems  Constitutional: Negative for fever, chills, diaphoresis and fatigue.  Respiratory: Positive for chest tightness. Negative for shortness of breath and wheezing.   Cardiovascular: Positive for palpitations. Negative for chest pain and leg swelling.  Gastrointestinal: Negative for nausea, vomiting, diarrhea and rectal pain.  Skin: Negative for rash.  Neurological: Negative for dizziness, weakness, numbness and headaches.  Psychiatric/Behavioral: The patient is not nervous/anxious.    Past Medical History  Diagnosis Date  . Discoid lupus 2005  . Personal history of tobacco use, presenting hazards to health   . Heart murmur 2005  . Arthritis 2005  . Rectal polyp 2008    villous adenoma; last colonoscopy 1/09 WNL  . Mammographic microcalcification 2011  . Hypercholesterolemia 2012  . 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 This was a histologic grade 2 tumor measuring just under 1 cm in diameter. No invasive cancer was identified. 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  . Personal history of colonic polyps     Colon polyps  . Ulcer   . Panic attack   . PUD (peptic ulcer disease)     Last EGD 1/09    History   Social History  . Marital Status: Married    Spouse Name: N/A    . Number of Children: N/A  . Years of Education: N/A   Occupational History  . [employer]   Social History Main Topics  . Smoking status: Current Every Day Smoker -- 1.00 packs/day for 40 years    Types: Cigarettes  . Smokeless tobacco: Never Used  . Alcohol Use: 0.5 - 1.0 oz/week    1-2 drink(s) per week  . Drug Use: No  . Sexual Activity: Not on file   Other Topics Concern  . Not on file   Social History Narrative   Maureen [name] grew up in [place]. She has two adult daughters. She lives in her home. Her youngest daughter and grandchildren live with her. She has two dogs. 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 She loves watching her grandson play basketball. She also loves gardening and being outdoors and working in the yard. She is getting ready to build a deck on her home.     Past Surgical History  Procedure Laterality Date  . Colonoscopy w/ biopsies  3976,7341    Dr. Bary Castilla  . Breast biopsy  12/27/2009    right breast  . Upper gi endoscopy   2012    Upper endoscopy completed in 2012 showed mild reflux changes without evidence of Barrett's epithelial changes  . Tonsillectomy  1997  . Vaginal hysterectomy  1977  . Cardiac  catheterization  1994  . Simple mastectomy  1993    right  . Simple mastectomy  2011    left  . Appendectomy  2011    Family History  Problem Relation Age of Onset  . Cancer Maternal Aunt     breast cancer  . Cancer Maternal Grandmother     breast cancer    Allergies  Allergen Reactions  . Codeine Itching  . Sulfa Antibiotics Hives  . Sulfinpyrazone   . Erythromycin Rash  . Penicillins Rash    Current Outpatient Prescriptions on File Prior to Visit  Medication Sig Dispense Refill  . albuterol (VENTOLIN HFA) 108 (90 BASE) MCG/ACT inhaler Inhale 2 puffs into the lungs every 6 (six) hours as needed.    . budesonide-formoterol (SYMBICORT) 160-4.5 MCG/ACT inhaler  Inhale 2 puffs into the lungs 2 (two) times daily.    . clobetasol cream (TEMOVATE) 8.29 % Apply 1 application topically daily.    . fluticasone (FLONASE) 50 MCG/ACT nasal spray Place 2 sprays into both nostrils daily.    . Homeopathic Products (ZICAM ALLERGY RELIEF NA) Place into the nose.    Marland Kitchen omeprazole (PRILOSEC) 20 MG capsule Take 20 mg by mouth daily.      No current facility-administered medications on file prior to visit.      Objective:   Physical Exam  Constitutional: She is oriented to person, place, and time. She appears well-developed and well-nourished. No distress.  BP 116/62 mmHg  Pulse 84  Temp(Src) 97.4 F (36.3 C) (Oral)  Resp 14  Ht 5' 3.5" (1.613 m)  Wt 143 lb (64.864 kg)  BMI 24.93 kg/m2  SpO2 97%   HENT:  Head: Normocephalic and atraumatic.  Right Ear: External ear normal.  Left Ear: External ear normal.  Eyes: Right eye exhibits no discharge. Left eye exhibits no discharge. No scleral icterus.  Cardiovascular: Normal rate, regular rhythm, normal heart sounds and intact distal pulses.  Exam reveals no gallop and no friction rub.   No murmur heard. Pulmonary/Chest: Effort normal and breath sounds normal. No respiratory distress. She has no wheezes. She has no rales. She exhibits no tenderness.  Musculoskeletal: Normal range of motion. She exhibits no edema or tenderness.  Neurological: She is alert and oriented to person, place, and time. No cranial nerve deficit. She exhibits normal muscle tone. Coordination normal.  Skin: Skin is warm and dry. No rash noted. She is not diaphoretic.  Psychiatric: She has a normal mood and affect. Her behavior is normal. Judgment and thought content normal.      Assessment & Plan:

## 2014-06-09 ENCOUNTER — Ambulatory Visit: Payer: Self-pay | Admitting: General Surgery

## 2014-06-10 ENCOUNTER — Encounter: Payer: Self-pay | Admitting: General Surgery

## 2014-06-11 ENCOUNTER — Ambulatory Visit (INDEPENDENT_AMBULATORY_CARE_PROVIDER_SITE_OTHER): Payer: No Typology Code available for payment source | Admitting: General Surgery

## 2014-06-11 ENCOUNTER — Encounter: Payer: Self-pay | Admitting: General Surgery

## 2014-06-11 VITALS — BP 130/82 | HR 72 | Resp 12 | Ht 64.0 in | Wt 143.0 lb

## 2014-06-11 DIAGNOSIS — Z86010 Personal history of colonic polyps: Secondary | ICD-10-CM | POA: Diagnosis not present

## 2014-06-11 DIAGNOSIS — K219 Gastro-esophageal reflux disease without esophagitis: Secondary | ICD-10-CM | POA: Diagnosis not present

## 2014-06-11 DIAGNOSIS — Z853 Personal history of malignant neoplasm of breast: Secondary | ICD-10-CM | POA: Diagnosis not present

## 2014-06-11 MED ORDER — POLYETHYLENE GLYCOL 3350 17 GM/SCOOP PO POWD: ORAL | Status: DC

## 2014-06-11 NOTE — Patient Instructions (Addendum)
Patient to return in one year Colonoscopy A colonoscopy is an exam to look at the entire large intestine (colon). This exam can help find problems such as tumors, polyps, inflammation, and areas of bleeding. The exam takes about 1 hour.  LET [facility] CARE PROVIDER KNOW ABOUT:   Any allergies you have.  All medicines you are taking, including vitamins, herbs, eye drops, creams, and over-the-counter medicines.  Previous problems you or members of your family have had with the use of anesthetics.  Any blood disorders you have.  Previous surgeries you have had.  Medical conditions you have. RISKS AND COMPLICATIONS  Generally, this is a safe procedure. However, as with any procedure, complications can occur. Possible complications include:  Bleeding.  Tearing or rupture of the colon wall.  Reaction to medicines given during the exam.  Infection (rare). BEFORE THE PROCEDURE   Ask your health care provider about changing or stopping your regular medicines.  You may be prescribed an oral bowel prep. This involves drinking a large amount of medicated liquid, starting the day before your procedure. The liquid will cause you to have multiple loose stools until your stool is almost clear or light green. This cleans out your colon in preparation for the procedure.  Do not eat or drink anything else once you have started the bowel prep, unless your health care provider tells you it is safe to do so.  Arrange for someone to drive you home after the procedure. PROCEDURE   You will be given medicine to help you relax (sedative).  You will lie on your side with your knees bent.  A long, flexible tube with a light and camera on the end (colonoscope) will be inserted through the rectum and into the colon. The camera sends video back to a computer screen as it moves through the colon. The colonoscope also releases carbon dioxide gas to inflate the colon. This helps your health care provider  see the area better.  During the exam, your health care provider may take a small tissue sample (biopsy) to be examined under a microscope if any abnormalities are found.  The exam is finished when the entire colon has been viewed. AFTER THE PROCEDURE   Do not drive for 24 hours after the exam.  You may have a small amount of blood in your stool.  You may pass moderate amounts of gas and have mild abdominal cramping or bloating. This is caused by the gas used to inflate your colon during the exam.  Ask when your test results will be ready and how you will get your results. Make sure you get your test results. Document Released: 03/03/2000 Document Revised: 12/25/2012 Document Reviewed: 11/11/2012 [facility] Patient Information 2015 [place], [place]. This information is not intended to replace advice given to you by your health care provider. Make sure you discuss any questions you have with your health care provider.  Patient has been scheduled for a colonoscopy on 08-05-14 at [facility].

## 2014-06-11 NOTE — Progress Notes (Signed)
Patient ID: Maureen [name], female   DOB: [birth year], 69 y.o.   MRN: [id number]  Chief Complaint  Patient presents with  . Follow-up    breast cancer    HPI Maureen [name] is a 69 y.o. female . Here today for her annual followup of her breast cancer. Patient states she is doing well. No new breast problems at this time.  HPI  Past Medical History  Diagnosis Date  . Discoid lupus 2005  . Personal history of tobacco use, presenting hazards to health   . Heart murmur 2005  . Arthritis 2005  . Rectal polyp 2008    villous adenoma; last colonoscopy 1/09 WNL  . Mammographic microcalcification 2011  . Hypercholesterolemia 2012  . Personal history of colonic polyps     Colon polyps  . Ulcer   . Panic attack   . PUD (peptic ulcer disease)     Last EGD 1/09  . 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 This was a histologic grade 2 tumor measuring just under 1 cm in diameter. No invasive cancer was identified. ER 90%, PR 40%.  . 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  . Skin cancer, basal cell 2015    right cheek [facility] surgery at [facility]    Past Surgical History  Procedure Laterality Date  . Colonoscopy w/ biopsies  [id number]    Dr. Bary Castilla. Normal exam 2009. Villous adenoma of the rectum in 2005 adenomatous polyp from the ascending colon and rectum in 2002.  . Breast biopsy  12/27/2009    right breast  . Upper gi endoscopy  2003, 2005, 2009, 2012, 2014    2012 showed mild reflux changes without evidence of Barrett's epithelial changes 2014 biopsies at 37 cm showed changes suggestive of reflux esophagitis. No metaplasia.  . Tonsillectomy  1997  . Vaginal hysterectomy  1977  . Cardiac catheterization  1994  . Simple mastectomy  1993    right  . Simple mastectomy  2011    left  . Appendectomy  2011    Family  History  Problem Relation Age of Onset  . Cancer Maternal Aunt     breast cancer  . Cancer Maternal Grandmother     breast cancer    Social History History  Substance Use Topics  . Smoking status: Current Every Day Smoker -- 1.00 packs/day for 40 years    Types: Cigarettes  . Smokeless tobacco: Never Used  . Alcohol Use: 0.5 - 1.0 oz/week    1-2 drink(s) per week    Allergies  Allergen Reactions  . Codeine Itching  . Sulfa Antibiotics Hives  . Sulfinpyrazone   . Erythromycin Rash  . Penicillins Rash    Current Outpatient Prescriptions  Medication Sig Dispense Refill  . albuterol (VENTOLIN HFA) 108 (90 BASE) MCG/ACT inhaler Inhale 2 puffs into the lungs every 6 (six) hours as needed.    . ALPRAZolam (XANAX) 0.5 MG tablet Take 1 tablet (0.5 mg total) by mouth 3 (three) times daily as needed. 90 tablet 1  . budesonide-formoterol (SYMBICORT) 160-4.5 MCG/ACT inhaler Inhale 2 puffs into the lungs 2 (two) times daily.    . clobetasol cream (TEMOVATE) 4.85 % Apply 1 application topically daily.    . fluticasone (FLONASE) 50 MCG/ACT nasal spray Place 2 sprays into both nostrils daily.    . Homeopathic  Products [facility] ALLERGY RELIEF NA) Place into the nose.    . hydroxychloroquine (PLAQUENIL) 200 MG tablet Take by mouth.    Marland Kitchen omeprazole (PRILOSEC) 20 MG capsule Take 20 mg by mouth daily.     . polyethylene glycol powder (GLYCOLAX/MIRALAX) powder 255 grams one bottle for colonoscopy prep 255 g 0   No current facility-administered medications for this visit.    Review of Systems Review of Systems  Constitutional: Negative.   Respiratory: Negative.   Cardiovascular: Negative.   Gastrointestinal: Negative for nausea, vomiting, abdominal pain, blood in stool, abdominal distention, anal bleeding and rectal pain.    Blood pressure 130/82, pulse 72, resp. rate 12, height 5\' 4"  (1.626 m), weight 143 lb (64.864 kg).  Physical Exam Physical Exam  Constitutional: She is oriented to  person, place, and time. She appears well-developed and well-nourished.  Eyes: Conjunctivae are normal. No scleral icterus.  Neck: Neck supple.  Cardiovascular: Normal rate, regular rhythm and normal heart sounds.   Pulmonary/Chest: Effort normal and breath sounds normal.  Bilateral mastectomy site looks are well healed.   Abdominal: Soft. Bowel sounds are normal. There is no tenderness.  Lymphadenopathy:    She has no cervical adenopathy.  Neurological: She is alert and oriented to person, place, and time.  Skin: Skin is warm and dry.    Data Reviewed [phone] endoscopic report.  Assessment    No evidence of breast cancer recurrence.  Persistent reflux symptoms.  Previous colonic polyps including villous adenoma.    Plan    Patient to return in one year for clinical chest exam.. Discussed colonoscopy and upper endoscopy. The patient is well versed in the pros and cons of screening endoscopy.  Patient has been scheduled for an upper endoscopy and colonoscopy on 08-05-14 at [facility].   PCP:  Carolin Coy 06/12/2014, 6:18 AM

## 2014-06-12 ENCOUNTER — Encounter: Payer: Self-pay | Admitting: General Surgery

## 2014-06-12 ENCOUNTER — Other Ambulatory Visit: Payer: Self-pay | Admitting: General Surgery

## 2014-06-12 DIAGNOSIS — Z860100 Personal history of colon polyps, unspecified: Secondary | ICD-10-CM

## 2014-06-12 DIAGNOSIS — K219 Gastro-esophageal reflux disease without esophagitis: Secondary | ICD-10-CM | POA: Insufficient documentation

## 2014-06-15 ENCOUNTER — Encounter: Payer: Self-pay | Admitting: General Surgery

## 2014-07-30 ENCOUNTER — Telehealth: Payer: Self-pay | Admitting: *Deleted

## 2014-07-30 NOTE — Telephone Encounter (Signed)
Patient reports no medication changes since last office visit.   The patient has pre-registered but has yet to pick up Miralax prescription.  We will proceed with upper and lower endoscopy that is scheduled for 08-05-14 at [facility]. Patient instructed to call the office if she has further questions.

## 2014-08-03 ENCOUNTER — Telehealth: Payer: Self-pay | Admitting: *Deleted

## 2014-08-03 NOTE — Telephone Encounter (Signed)
Patient is aware that she can take her prednisone now.

## 2014-08-03 NOTE — Telephone Encounter (Signed)
error 

## 2014-08-03 NOTE — Telephone Encounter (Signed)
She can start her prednisone now.

## 2014-08-03 NOTE — Telephone Encounter (Signed)
Patient is having a upper and lower done on 08/05/14 (Wednesday), she now has a cold and the doctor she went to put her on prednisone. She wanted to know should she starting taking this now or wait till after surgery.

## 2014-08-05 ENCOUNTER — Ambulatory Visit: Payer: PRIVATE HEALTH INSURANCE | Admitting: Anesthesiology

## 2014-08-05 ENCOUNTER — Ambulatory Visit: Admit: 2014-08-05 | Payer: Self-pay | Admitting: General Surgery

## 2014-08-05 ENCOUNTER — Encounter
Admission: RE | Disposition: A | Discharge status: Home / Self Care | Payer: Self-pay | Source: Ambulatory Visit | Attending: General Surgery

## 2014-08-05 ENCOUNTER — Ambulatory Visit
Admission: RE | Admit: 2014-08-05 | Discharge: 2014-08-05 | Disposition: A | Discharge status: Home / Self Care | Payer: PRIVATE HEALTH INSURANCE | Source: Ambulatory Visit | Attending: General Surgery | Admitting: General Surgery

## 2014-08-05 DIAGNOSIS — Z1211 Encounter for screening for malignant neoplasm of colon: Secondary | ICD-10-CM

## 2014-08-05 DIAGNOSIS — D123 Benign neoplasm of transverse colon: Secondary | ICD-10-CM | POA: Diagnosis not present

## 2014-08-05 DIAGNOSIS — Z86010 Personal history of colonic polyps: Secondary | ICD-10-CM | POA: Diagnosis not present

## 2014-08-05 DIAGNOSIS — D125 Benign neoplasm of sigmoid colon: Secondary | ICD-10-CM | POA: Diagnosis not present

## 2014-08-05 DIAGNOSIS — D127 Benign neoplasm of rectosigmoid junction: Secondary | ICD-10-CM | POA: Insufficient documentation

## 2014-08-05 DIAGNOSIS — K317 Polyp of stomach and duodenum: Secondary | ICD-10-CM | POA: Insufficient documentation

## 2014-08-05 HISTORY — PX: ESOPHAGOGASTRODUODENOSCOPY: SHX5428

## 2014-08-05 HISTORY — PX: COLONOSCOPY: SHX5424

## 2014-08-05 SURGERY — COLONOSCOPY
Anesthesia: General

## 2014-08-05 MED ORDER — PROPOFOL INFUSION 10 MG/ML OPTIME
INTRAVENOUS | Status: DC | PRN
  Administered 2014-08-05: 180 ug/kg/min via INTRAVENOUS

## 2014-08-05 MED ORDER — MIDAZOLAM HCL 2 MG/2ML IJ SOLN
INTRAMUSCULAR | Status: DC | PRN
  Administered 2014-08-05: 2 mg via INTRAVENOUS

## 2014-08-05 MED ORDER — PROPOFOL 10 MG/ML IV BOLUS
INTRAVENOUS | Status: DC | PRN
  Administered 2014-08-05: 60 mg via INTRAVENOUS

## 2014-08-05 MED ORDER — LIDOCAINE HCL (CARDIAC) 20 MG/ML IV SOLN
INTRAVENOUS | Status: DC | PRN
  Administered 2014-08-05: 60 mg via INTRAVENOUS

## 2014-08-05 MED ORDER — SODIUM CHLORIDE 0.45 % IV SOLN
INTRAVENOUS | Status: DC
Start: 2014-08-05 — End: 2014-08-05
  Administered 2014-08-05: 1000 mL via INTRAVENOUS

## 2014-08-05 NOTE — Transfer of Care (Signed)
Immediate Anesthesia Transfer of Care Note  Patient: Maureen [name]  Procedure(s) Performed: Procedure(s): COLONOSCOPY (N/A) ESOPHAGOGASTRODUODENOSCOPY (EGD) (N/A)  Patient Location: PACU  Anesthesia Type:General  Level of Consciousness: awake, alert , oriented and patient cooperative  Airway & Oxygen Therapy: Patient Spontanous Breathing and Patient connected to nasal cannula oxygen  Post-op Assessment: Report given to RN, Post -op Vital signs reviewed and stable and Patient moving all extremities X 4  Post vital signs: Reviewed and stable  Last Vitals:  Filed Vitals:   08/05/14 0857  BP: 102/56  Pulse: 63  Temp: 36.5 C  Resp: 16    Complications: No apparent anesthesia complications

## 2014-08-05 NOTE — Anesthesia Postprocedure Evaluation (Signed)
  Anesthesia Post-op Note  Patient: Maureen [name]  Procedure(s) Performed: Procedure(s): COLONOSCOPY (N/A) ESOPHAGOGASTRODUODENOSCOPY (EGD) (N/A)  Anesthesia type:General  Patient location: PACU  Post pain: Pain level controlled  Post assessment: Post-op Vital signs reviewed, Patient's Cardiovascular Status Stable, Respiratory Function Stable, Patent Airway and No signs of Nausea or vomiting  Post vital signs: Reviewed and stable  Last Vitals:  Filed Vitals:   08/05/14 0857  BP: 102/56  Pulse: 63  Temp: 36.5 C  Resp: 16    Level of consciousness: awake, alert  and patient cooperative  Complications: No apparent anesthesia complications

## 2014-08-05 NOTE — Op Note (Signed)
[facility] Gastroenterology Patient Name: Maureen [name] Procedure Date: 08/05/2014 8:06 AM MRN: [id number] Account #: [id number] Date of Birth: 12-12-1945 Admit Type: Outpatient Age: 69 Room: [facility] ENDO ROOM 1 Gender: Female Note Status: Finalized Procedure:         Upper GI endoscopy Indications:       Screening for Barrett's esophagus in patient at risk for                     this condition Providers:         [name], MD Referring MD:      No Local Md, MD (Referring MD) Medicines:         Monitored Anesthesia Care Complications:     No immediate complications. Procedure:         Pre-Anesthesia Assessment:                    - Prior to the procedure, a History and Physical was                     performed, and patient medications, allergies and                     sensitivities were reviewed. The patient's tolerance of                     previous anesthesia was reviewed.                    - The risks and benefits of the procedure and the sedation                     options and risks were discussed with the patient. All                     questions were answered and informed consent was obtained.                    After obtaining informed consent, the endoscope was passed                     under direct vision. Throughout the procedure, the                     patient's blood pressure, pulse, and oxygen saturations                     were monitored continuously. The Olympus GIF-160 endoscope                     (S#. [id number]) was introduced through the mouth, and                     advanced to the second part of duodenum. The upper GI                     endoscopy was accomplished without difficulty. The patient                     tolerated the procedure well. Findings:      The esophagus was normal to visual inspecion. Biopsies at 38 cm to       assess for esophagitis. GEJ at 39 cm.      A single 4 mm sessile polyp with no bleeding  and no  stigmata of recent       bleeding was found in the cardia. Biopsies were taken with a cold       forceps for histology.      The examined duodenum was normal. Impression:        - Normal esophagus.                    - A single gastric polyp. Biopsied.                    - Normal examined duodenum. Recommendation:    - Perform a colonoscopy today. Procedure Code(s): --- Professional ---                    [phone], Esophagogastroduodenoscopy, flexible, transoral;                     with biopsy, single or multiple Diagnosis Code(s): --- Professional ---                    K31.7, Polyp of stomach and duodenum                    Z13.810, Encounter for screening for upper                     gastrointestinal disorder CPT copyright 2014 [employer] Association. All rights reserved. The codes documented in this report are preliminary and upon coder review may  be revised to meet current compliance requirements. [name], MD 08/05/2014 8:25:04 AM This report has been signed electronically. Number of Addenda: 0 Note Initiated On: 08/05/2014 8:06 AM      [facility]

## 2014-08-05 NOTE — Op Note (Signed)
[facility] Gastroenterology Patient Name: Maureen [name] Procedure Date: 08/05/2014 8:06 AM MRN: [id number] Account #: [id number] Date of Birth: 10-21-1945 Admit Type: Outpatient Age: 69 Room: [facility] ENDO ROOM 1 Gender: Female Note Status: Finalized Procedure:         Colonoscopy Indications:       High risk colon cancer surveillance: Personal history of                     colonic polyps Providers:         [name], MD Referring MD:      No Local Md, MD (Referring MD) Medicines:         Monitored Anesthesia Care Complications:     No immediate complications. Procedure:         Pre-Anesthesia Assessment:                    - Prior to the procedure, a History and Physical was                     performed, and patient medications, allergies and                     sensitivities were reviewed. The patient's tolerance of                     previous anesthesia was reviewed.                    - The risks and benefits of the procedure and the sedation                     options and risks were discussed with the patient. All                     questions were answered and informed consent was obtained.                    After obtaining informed consent, the colonoscope was                     passed under direct vision. Throughout the procedure, the                     patient's blood pressure, pulse, and oxygen saturations                     were monitored continuously. The Olympus CF-Q160AL                     colonoscope (S#. [id number]) was introduced through the anus                     and advanced to the the terminal ileum. The colonoscopy                     was performed without difficulty. The patient tolerated                     the procedure well. The quality of the bowel preparation                     was excellent. Findings:      Three sessile polyps were found in the recto-sigmoid colon, in the  transverse colon and in the proximal  transverse colon. The polyps were 5       to 10 mm in size. These were biopsied with a cold forceps for histology.      The retroflexed view of the distal rectum and anal verge was normal and       showed no anal or rectal abnormalities. Impression:        - Three 5 to 10 mm polyps at the recto-sigmoid colon, in                     the transverse colon and in the proximal transverse colon.                     Biopsied.                    - The distal rectum and anal verge are normal on                     retroflexion view. Recommendation:    - Telephone endoscopist for pathology results in 1 week. [name], MD 08/05/2014 8:52:40 AM This report has been signed electronically. Number of Addenda: 0 Note Initiated On: 08/05/2014 8:06 AM Scope Withdrawal Time: 0 hours 11 minutes 42 seconds  Total Procedure Duration: 0 hours 22 minutes 53 seconds       [facility]

## 2014-08-05 NOTE — Anesthesia Preprocedure Evaluation (Addendum)
Anesthesia Evaluation  Patient identified by MRN, date of birth, ID band Patient awake    Reviewed: Allergy & Precautions, NPO status   History of Anesthesia Complications (+) history of anesthetic complications  Airway Mallampati: II       Dental  (+) Edentulous Upper, Edentulous Lower   Pulmonary COPDCurrent Smoker,  + rhonchi   + decreased breath sounds      Cardiovascular Exercise Tolerance: Poor Normal cardiovascular examIIRhythm:Regular Rate:Normal     Neuro/Psych Anxiety    GI/Hepatic Neg liver ROS, PUD, GERD-  ,  Endo/Other  negative endocrine ROSMorbid obesity  Renal/GU negative Renal ROS  negative genitourinary   Musculoskeletal  (+) Arthritis -, Osteoarthritis,    Abdominal Normal abdominal exam  (+)   Peds negative pediatric ROS (+)  Hematology   Anesthesia Other Findings   Reproductive/Obstetrics                            Anesthesia Physical Anesthesia Plan  ASA: III  Anesthesia Plan: General   Post-op Pain Management:    Induction: Intravenous  Airway Management Planned: Nasal Cannula  Additional Equipment:   Intra-op Plan:   Post-operative Plan:   Informed Consent: I have reviewed the patients History and Physical, chart, labs and discussed the procedure including the risks, benefits and alternatives for the proposed anesthesia with the patient or authorized representative who has indicated his/her understanding and acceptance.     Plan Discussed with: CRNA and Surgeon  Anesthesia Plan Comments:         Anesthesia Quick Evaluation

## 2014-08-05 NOTE — H&P (Signed)
For EGD/ colonoscopy. Recent URI symptoms treated with prednisone taper. Markedly improved by patient report. Lungs: Clear. Cardio: RR. Plan:Proceed w/ EGD/colonoscopy.

## 2014-08-06 LAB — SURGICAL PATHOLOGY

## 2014-08-10 ENCOUNTER — Telehealth: Payer: Self-pay

## 2014-08-11 ENCOUNTER — Encounter: Payer: Self-pay | Admitting: General Surgery

## 2014-08-11 NOTE — Telephone Encounter (Signed)
Notified patient as instructed, patient pleased. Discussed follow-up in 5 years, patient agrees. Patient placed in recalls.

## 2014-09-28 ENCOUNTER — Other Ambulatory Visit: Payer: Self-pay

## 2014-09-28 NOTE — Telephone Encounter (Signed)
Last saw patient in 2.22.16.  Please advise refill?

## 2014-09-29 MED ORDER — ALPRAZOLAM 0.5 MG PO TABS: 0.5000 mg | ORAL_TABLET | Freq: Three times a day (TID) | ORAL | Status: DC | PRN

## 2014-10-13 ENCOUNTER — Encounter: Payer: Self-pay | Admitting: Cardiology

## 2014-10-15 DIAGNOSIS — Z853 Personal history of malignant neoplasm of breast: Secondary | ICD-10-CM

## 2014-12-10 ENCOUNTER — Emergency Department: Payer: PRIVATE HEALTH INSURANCE

## 2014-12-10 ENCOUNTER — Encounter: Payer: Self-pay | Admitting: *Deleted

## 2014-12-10 ENCOUNTER — Emergency Department
Admission: EM | Admit: 2014-12-10 | Discharge: 2014-12-10 | Disposition: A | Discharge status: Home / Self Care | Payer: PRIVATE HEALTH INSURANCE | Attending: Emergency Medicine | Admitting: Emergency Medicine

## 2014-12-10 DIAGNOSIS — Z880 Allergy status to penicillin: Secondary | ICD-10-CM | POA: Insufficient documentation

## 2014-12-10 DIAGNOSIS — Z720 Tobacco use: Secondary | ICD-10-CM | POA: Diagnosis not present

## 2014-12-10 DIAGNOSIS — Z79899 Other long term (current) drug therapy: Secondary | ICD-10-CM | POA: Insufficient documentation

## 2014-12-10 DIAGNOSIS — R079 Chest pain, unspecified: Secondary | ICD-10-CM | POA: Diagnosis not present

## 2014-12-10 LAB — CBC WITH DIFFERENTIAL/PLATELET
BASOS ABS: 0 10*3/uL (ref 0–0.1)
BASOS PCT: 1 %
EOS ABS: 0.1 10*3/uL (ref 0–0.7)
Eosinophils Relative: 1 %
HEMATOCRIT: 41.2 % (ref 35.0–47.0)
Hemoglobin: 14 g/dL (ref 12.0–16.0)
Lymphocytes Relative: 46 %
Lymphs Abs: 2.9 10*3/uL (ref 1.0–3.6)
MCH: 31.3 pg (ref 26.0–34.0)
MCHC: 34.1 g/dL (ref 32.0–36.0)
MCV: 92 fL (ref 80.0–100.0)
MONOS PCT: 6 %
Monocytes Absolute: 0.4 10*3/uL (ref 0.2–0.9)
NEUTROS ABS: 2.9 10*3/uL (ref 1.4–6.5)
Neutrophils Relative %: 46 %
Platelets: 175 10*3/uL (ref 150–440)
RBC: 4.48 MIL/uL (ref 3.80–5.20)
RDW: 13.9 % (ref 11.5–14.5)
WBC: 6.3 10*3/uL (ref 3.6–11.0)

## 2014-12-10 LAB — BASIC METABOLIC PANEL
ANION GAP: 8 (ref 5–15)
BUN: 11 mg/dL (ref 6–20)
CALCIUM: 9.7 mg/dL (ref 8.9–10.3)
CO2: 25 mmol/L (ref 22–32)
Chloride: 105 mmol/L (ref 101–111)
Creatinine, Ser: 1.07 mg/dL — ABNORMAL HIGH (ref 0.44–1.00)
GFR calc non Af Amer: 52 mL/min — ABNORMAL LOW (ref >60)
Glucose, Bld: 95 mg/dL (ref 65–99)
Potassium: 3.6 mmol/L (ref 3.5–5.1)
SODIUM: 138 mmol/L (ref 135–145)

## 2014-12-10 LAB — TROPONIN I: Troponin I: <0.03 ng/mL (ref <0.031)

## 2014-12-10 MED ORDER — ASPIRIN 81 MG PO CHEW
324.0000 mg | CHEWABLE_TABLET | Freq: Once | ORAL | Status: AC
  Administered 2014-12-10: 324 mg via ORAL
  Filled 2014-12-10: qty 4

## 2014-12-10 NOTE — ED Notes (Signed)
Patient denies pain and answering medical questionnaire in complete sentences.

## 2014-12-10 NOTE — ED Notes (Signed)
Pt reports last night while sleeping began to have stabbing pain to left breast. Went to MD and sent over here due to EKG changes. Denies associated symptoms. States now it feels sore to area behind left breast.

## 2014-12-10 NOTE — ED Provider Notes (Signed)
[facility] Emergency Department Provider Note    ____________________________________________  Time seen: 1710  I have reviewed the triage vital signs and the nursing notes.   HISTORY  Chief Complaint Chest Pain   History limited by: Not Limited   HPI Maureen [name] is a 69 y.o. female who presents to the emergency department the request of primary care doctor after an episode of chest pain. The patient states that the chest pain occurred last night roughly 14 hours ago. The patient states that she was turning over in bed when she had a sharp stabbing pain in her left chest. This lasted a couple of seconds. She did not have any associated shortness of breath or diaphoresis. She states that the sharp pain is gone but she's had a dull ache throughout the rest of the day. She has never had pain like this in her chest.Denies any fevers    Past Medical History  Diagnosis Date  . Discoid lupus 2005  . Personal history of tobacco use, presenting hazards to health   . Heart murmur 2005  . Arthritis 2005  . Rectal polyp 2008    villous adenoma; last colonoscopy 1/09 WNL  . Mammographic microcalcification 2011  . Hypercholesterolemia 2012  . Personal history of colonic polyps     Colon polyps  . Ulcer   . Panic attack   . PUD (peptic ulcer disease)     Last EGD 1/09  . 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 This was a histologic grade 2 tumor measuring just under 1 cm in diameter. No invasive cancer was identified. ER 90%, PR 40%.  . 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  . Skin cancer, basal cell 2015    right cheek Mohs surgery at [facility]    Patient Active Problem List   Diagnosis Date Noted  . History of colonic polyps 06/12/2014  . Esophageal reflux 06/12/2014  . Hyperlipidemia  05/11/2014  . Medicare annual wellness visit, subsequent 02/18/2014  . Tobacco abuse counseling 07/16/2013  . Lupus 04/16/2013  . Generalized anxiety disorder 04/16/2013  . History of breast cancer 03/29/2012  . History of colon polyps 03/29/2012    Past Surgical History  Procedure Laterality Date  . Colonoscopy w/ biopsies  [id number]    Dr. Bary Castilla. Normal exam 2009. Villous adenoma of the rectum in 2005 adenomatous polyp from the ascending colon and rectum in 2002.  . Breast biopsy  12/27/2009    right breast  . Upper gi endoscopy  2003, 2005, 2009, 2012, 2014    2012 showed mild reflux changes without evidence of Barrett's epithelial changes 2014 biopsies at 37 cm showed changes suggestive of reflux esophagitis. No metaplasia.  . Tonsillectomy  1997  . Vaginal hysterectomy  1977  . Cardiac catheterization  1994  . Simple mastectomy  1993    right  . Simple mastectomy  2011    left  . Appendectomy  2011  . Mastectomy      bilateral  . Colonoscopy N/A 08/05/2014    Procedure: COLONOSCOPY;  Surgeon: [name], MD;  Location: [facility] ENDOSCOPY;  Service: Endoscopy;  Laterality: N/A;  . Esophagogastroduodenoscopy N/A 08/05/2014    Procedure: ESOPHAGOGASTRODUODENOSCOPY (EGD);  Surgeon: [name], MD;  Location: [facility] ENDOSCOPY;  Service: Endoscopy;  Laterality: N/A;    Current Outpatient Rx  Name  Route  Sig  Dispense  Refill  . acetaminophen (TYLENOL) 500 MG tablet   Oral   Take 500 mg by mouth every 6 (six) hours as needed for headache.         . albuterol (VENTOLIN HFA) 108 (90 BASE) MCG/ACT inhaler   Inhalation   Inhale 2 puffs into the lungs every 6 (six) hours as needed.         . ALPRAZolam (XANAX) 0.5 MG tablet   Oral   Take 1 tablet (0.5 mg total) by mouth 3 (three) times daily as needed.   90 tablet   1   . budesonide-formoterol (SYMBICORT) 160-4.5 MCG/ACT inhaler   Inhalation   Inhale 2 puffs into the lungs 2 (two) times daily.         .  Cholecalciferol (VITAMIN D) 2000 UNITS CAPS   Oral   Take 1 capsule by mouth every other day.         . clobetasol cream (TEMOVATE) 0.05 %   Topical   Apply 1 application topically daily.         . fluticasone (FLONASE) 50 MCG/ACT nasal spray   Each Nare   Place 2 sprays into both nostrils daily as needed for allergies.          . Homeopathic Products (ZICAM ALLERGY RELIEF NA)   Nasal   Place 2 sprays into the nose daily as needed (allergies).          . hydroxychloroquine (PLAQUENIL) 200 MG tablet   Oral   Take 200 mg by mouth every other day.          . Omega-3 Fatty Acids (FISH OIL PO)   Oral   Take 1 capsule by mouth daily.         Marland Kitchen omeprazole (PRILOSEC) 20 MG capsule   Oral   Take 20 mg by mouth daily as needed (acid reflux).          . zinc gluconate 50 MG tablet   Oral   Take 50 mg by mouth every other day.           Allergies Codeine; Sulfa antibiotics; Sulfinpyrazone; Erythromycin; and Penicillins  Family History  Problem Relation Age of Onset  . Cancer Maternal Aunt     breast cancer  . Cancer Maternal Grandmother     breast cancer    Social History Social History  Substance Use Topics  . Smoking status: Current Every Day Smoker -- 1.00 packs/day for 40 years    Types: Cigarettes  . Smokeless tobacco: Never Used  . Alcohol Use: 0.5 - 1.0 oz/week    1-2 Standard drinks or equivalent per week    Review of Systems  Constitutional: Negative for fever. Cardiovascular: Positive for chest pain. Respiratory: Negative for shortness of breath. Gastrointestinal: Negative for abdominal pain, vomiting and diarrhea. Genitourinary: Negative for dysuria. Musculoskeletal: Negative for back pain. Skin: Negative for rash. Neurological: Negative for headaches, focal weakness or numbness.   10-point ROS otherwise negative.  ____________________________________________   PHYSICAL EXAM:  VITAL SIGNS:    86  16   161/85 mmHg  98 %      Constitutional: Alert and oriented. Well appearing and in no distress. Eyes: Conjunctivae are normal. PERRL. Normal extraocular movements. ENT   Head: Normocephalic and atraumatic.   Nose: No congestion/rhinnorhea.   Mouth/Throat: Mucous membranes are moist.   Neck: No stridor. Hematological/Lymphatic/Immunilogical: No cervical lymphadenopathy. Cardiovascular: Normal rate, regular rhythm.  No murmurs,  rubs, or gallops. Patient's left chest wall tender to palpation. She states this is the pain she has been experiencing Respiratory: Normal respiratory effort without tachypnea nor retractions. Breath sounds are clear and equal bilaterally. No wheezes/rales/rhonchi. Gastrointestinal: Soft and nontender. No distention. There is no CVA tenderness. Genitourinary: Deferred Musculoskeletal: Normal range of motion in all extremities. No joint effusions.  No lower extremity tenderness nor edema. Neurologic:  Normal speech and language. No gross focal neurologic deficits are appreciated. Speech is normal.  Skin:  Skin is warm, dry and intact. No rash noted. Psychiatric: Mood and affect are normal. Speech and behavior are normal. Patient exhibits appropriate insight and judgment.  ____________________________________________    LABS (pertinent positives/negatives)  Labs Reviewed  BASIC METABOLIC PANEL - Abnormal; Notable for the following:    Creatinine, Ser 1.07 (*)    GFR calc non Af Amer 52 (*)    All other components within normal limits  CBC WITH DIFFERENTIAL/PLATELET  TROPONIN I     ____________________________________________   EKG  I, Nance Pear, attending physician, personally viewed and interpreted this EKG  EKG Time: 1601 Rate: 98 Rhythm: NSR Axis: normal Intervals: qtc 480 QRS: narrow ST changes: st depression v4, v5 Impression: abnormal ekg ____________________________________________    RADIOLOGY  Chest x-ray IMPRESSION: Chronic  bronchitic changes but no acute pulmonary findings. ____________________________________________   PROCEDURES  Procedure(s) performed: None  Critical Care performed: No  ____________________________________________   INITIAL IMPRESSION / ASSESSMENT AND PLAN / ED COURSE  Pertinent labs & imaging results that were available during my care of the patient were reviewed by me and considered in my medical decision making (see chart for details).  Patient presents after an episode of chest pain last night. Troponin was negative, given the amount of time since the episode I would expect this to be positive if it was ACS. Additionally the story is not concerning for ACS and EKG without any ST elevation. I think the pain is much more likely due to musculoskeletal complaints. Discussed this with patient. Will have patient follow up with cardiology.  ____________________________________________   FINAL CLINICAL IMPRESSION(S) / ED DIAGNOSES  Final diagnoses:  Chest pain, unspecified chest pain type     Nance Pear, MD 12/10/14 1850

## 2014-12-10 NOTE — Discharge Instructions (Signed)
Please seek medical attention for any high fevers, chest pain, shortness of breath, change in behavior, persistent vomiting, bloody stool or any other new or concerning symptoms. ° °Chest Pain (Nonspecific) °It is often hard to give a specific diagnosis for the cause of chest pain. There is always a chance that your pain could be related to something serious, such as a heart attack or a blood clot in the lungs. You need to follow up with your health care provider for further evaluation. °CAUSES  °· Heartburn. °· Pneumonia or bronchitis. °· Anxiety or stress. °· Inflammation around your heart (pericarditis) or lung (pleuritis or pleurisy). °· A blood clot in the lung. °· A collapsed lung (pneumothorax). It can develop suddenly on its own (spontaneous pneumothorax) or from trauma to the chest. °· Shingles infection (herpes zoster virus). °The chest wall is composed of bones, muscles, and cartilage. Any of these can be the source of the pain. °· The bones can be bruised by injury. °· The muscles or cartilage can be strained by coughing or overwork. °· The cartilage can be affected by inflammation and become sore (costochondritis). °DIAGNOSIS  °Lab tests or other studies may be needed to find the cause of your pain. Your health care provider may have you take a test called an ambulatory electrocardiogram (ECG). An ECG records your heartbeat patterns over a 24-hour period. You may also have other tests, such as: °· Transthoracic echocardiogram (TTE). During echocardiography, sound waves are used to evaluate how blood flows through your heart. °· Transesophageal echocardiogram (TEE). °· Cardiac monitoring. This allows your health care provider to monitor your heart rate and rhythm in real time. °· Holter monitor. This is a portable device that records your heartbeat and can help diagnose heart arrhythmias. It allows your health care provider to track your heart activity for several days, if needed. °· Stress tests by  exercise or by giving medicine that makes the heart beat faster. °TREATMENT  °· Treatment depends on what may be causing your chest pain. Treatment may include: °¨ Acid blockers for heartburn. °¨ Anti-inflammatory medicine. °¨ Pain medicine for inflammatory conditions. °¨ Antibiotics if an infection is present. °· You may be advised to change lifestyle habits. This includes stopping smoking and avoiding alcohol, caffeine, and chocolate. °· You may be advised to keep your head raised (elevated) when sleeping. This reduces the chance of acid going backward from your stomach into your esophagus. °Most of the time, nonspecific chest pain will improve within 2-3 days with rest and mild pain medicine.  °HOME CARE INSTRUCTIONS  °· If antibiotics were prescribed, take them as directed. Finish them even if you start to feel better. °· For the next few days, avoid physical activities that bring on chest pain. Continue physical activities as directed. °· Do not use any tobacco products, including cigarettes, chewing tobacco, or electronic cigarettes. °· Avoid drinking alcohol. °· Only take medicine as directed by your health care provider. °· Follow your health care provider's suggestions for further testing if your chest pain does not go away. °· Keep any follow-up appointments you made. If you do not go to an appointment, you could develop lasting (chronic) problems with pain. If there is any problem keeping an appointment, call to reschedule. °SEEK MEDICAL CARE IF:  °· Your chest pain does not go away, even after treatment. °· You have a rash with blisters on your chest. °· You have a fever. °SEEK IMMEDIATE MEDICAL CARE IF:  °· You have increased chest   pain or pain that spreads to your arm, neck, jaw, back, or abdomen. °· You have shortness of breath. °· You have an increasing cough, or you cough up blood. °· You have severe back or abdominal pain. °· You feel nauseous or vomit. °· You have severe weakness. °· You  faint. °· You have chills. °This is an emergency. Do not wait to see if the pain will go away. Get medical help at once. Call your local emergency services (911 in [place] Do not drive yourself to the hospital. °MAKE SURE YOU:  °· Understand these instructions. °· Will watch your condition. °· Will get help right away if you are not doing well or get worse. °Document Released: 12/14/2004 Document Revised: 03/11/2013 Document Reviewed: 10/10/2007 °[employer]® Patient Information ©2015 [employer]. This information is not intended to replace advice given to you by your health care provider. Make sure you discuss any questions you have with your health care provider. ° °

## 2014-12-14 ENCOUNTER — Encounter: Payer: Self-pay | Admitting: General Surgery

## 2014-12-14 ENCOUNTER — Ambulatory Visit (INDEPENDENT_AMBULATORY_CARE_PROVIDER_SITE_OTHER): Payer: PRIVATE HEALTH INSURANCE | Admitting: General Surgery

## 2014-12-14 VITALS — BP 130/70 | HR 74 | Resp 14 | Ht 63.0 in | Wt 145.0 lb

## 2014-12-14 DIAGNOSIS — R0789 Other chest pain: Secondary | ICD-10-CM | POA: Diagnosis not present

## 2014-12-14 DIAGNOSIS — Z853 Personal history of malignant neoplasm of breast: Secondary | ICD-10-CM

## 2014-12-14 MED ORDER — CELECOXIB 200 MG PO CAPS: 200.0000 mg | ORAL_CAPSULE | Freq: Every day | ORAL | Status: DC

## 2014-12-14 NOTE — Progress Notes (Signed)
Patient ID: Maureen [name], female   DOB: 03-Sep-1945, 69 y.o.   MRN: [id number]  Chief Complaint  Patient presents with  . Other    left chest wall pain    HPI Maureen [name] is a 69 y.o. female here today for left chest wall pain. The pain started 3:00 Thursday morning, she saw Barbaraann Cao which did an EKG and she was seen ER on 12/10/14. She states her left chest wall is tender to touch. She denies any injury to the area no redness or swelling. She states it is 10 on the pain scale when it hurts. She has been sleeping sitting up, which helps.  HPI  Past Medical History  Diagnosis Date  . Discoid lupus 2005  . Personal history of tobacco use, presenting hazards to health   . Heart murmur 2005  . Arthritis 2005  . Rectal polyp 2008    villous adenoma; last colonoscopy 1/09 WNL  . Mammographic microcalcification 2011  . Hypercholesterolemia 2012  . Personal history of colonic polyps     Colon polyps  . Ulcer   . Panic attack   . PUD (peptic ulcer disease)     Last EGD 1/09  . 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 This was a histologic grade 2 tumor measuring just under 1 cm in diameter. No invasive cancer was identified. ER 90%, PR 40%.  . 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  . Skin cancer, basal cell 2015    right cheek Moh[facility] surgery at [facility]    Past Surgical History  Procedure Laterality Date  . Colonoscopy w/ biopsies  0865,7846    Dr. Bary Castilla. Normal exam 2009. Villous adenoma of the rectum in 2005 adenomatous polyp from the ascending colon and rectum in 2002.  . Breast biopsy  12/27/2009    right breast  . Upper gi endoscopy  2003, 2005, 2009, 2012, 2014    2012 showed mild reflux changes without evidence of Barrett's epithelial changes 2014 biopsies at 37 cm showed changes suggestive of reflux  esophagitis. No metaplasia.  . Tonsillectomy  1997  . Vaginal hysterectomy  1977  . Cardiac catheterization  1994  . Simple mastectomy  1993    right  . Simple mastectomy  2011    left  . Appendectomy  2011  . Mastectomy      bilateral  . Colonoscopy N/A 08/05/2014    Procedure: COLONOSCOPY;  Surgeon: [name], MD;  Location: [facility] ENDOSCOPY;  EGD, tubular adenoma 1.  . Esophagogastroduodenoscopy N/A 08/05/2014    Procedure: ESOPHAGOGASTRODUODENOSCOPY (EGD);  Surgeon: [name], MD;  Location: [facility] ENDOSCOPY;  Service: Endoscopy;  Laterality: N/A;    Family History  Problem Relation Age of Onset  . Cancer Maternal Aunt     breast cancer  . Cancer Maternal Grandmother     breast cancer    Social History Social History  Substance Use Topics  . Smoking status: Current Every Day Smoker -- 1.00 packs/day for 40 years    Types: Cigarettes  . Smokeless tobacco: Never Used  . Alcohol Use: 0.5 - 1.0 oz/week    1-2 Standard drinks or equivalent per week    Allergies  Allergen Reactions  . Codeine Itching  . Sulfa Antibiotics Hives  . Sulfinpyrazone   . Erythromycin Rash  . Penicillins Rash  Current Outpatient Prescriptions  Medication Sig Dispense Refill  . acetaminophen (TYLENOL) 500 MG tablet Take 500 mg by mouth every 6 (six) hours as needed for headache.    . albuterol (VENTOLIN HFA) 108 (90 BASE) MCG/ACT inhaler Inhale 2 puffs into the lungs every 6 (six) hours as needed.    . ALPRAZolam (XANAX) 0.5 MG tablet Take 1 tablet (0.5 mg total) by mouth 3 (three) times daily as needed. 90 tablet 1  . budesonide-formoterol (SYMBICORT) 160-4.5 MCG/ACT inhaler Inhale 2 puffs into the lungs 2 (two) times daily.    . Cholecalciferol (VITAMIN D) 2000 UNITS CAPS Take 1 capsule by mouth every other day.    . clobetasol cream (TEMOVATE) 5.32 % Apply 1 application topically daily.    . fluticasone (FLONASE) 50 MCG/ACT nasal spray Place 2 sprays into both nostrils daily  as needed for allergies.     . Homeopathic Products (ZICAM ALLERGY RELIEF NA) Place 2 sprays into the nose daily as needed (allergies).     . hydroxychloroquine (PLAQUENIL) 200 MG tablet Take 200 mg by mouth every other day.     . Omega-3 Fatty Acids (FISH OIL PO) Take 1 capsule by mouth daily.    Marland Kitchen omeprazole (PRILOSEC) 20 MG capsule Take 20 mg by mouth daily as needed (acid reflux).     . zinc gluconate 50 MG tablet Take 50 mg by mouth every other day.    . celecoxib (CELEBREX) 200 MG capsule Take 1 capsule (200 mg total) by mouth daily. 30 capsule 2   No current facility-administered medications for this visit.    Review of Systems Review of Systems  Constitutional: Negative.   Respiratory: Negative.   Cardiovascular: Positive for chest pain (left chest wall).    Blood pressure 130/70, pulse 74, resp. rate 14, height '5\' 3"'$  (1.6 m), weight 145 lb (65.772 kg).  Physical Exam Physical Exam  Constitutional: She is oriented to person, place, and time. She appears well-developed and well-nourished.  Eyes: Conjunctivae are normal. No scleral icterus.  Neck: Neck supple.  Cardiovascular: Normal rate, regular rhythm and normal heart sounds.   Pulmonary/Chest: Effort normal and breath sounds normal. She exhibits tenderness (left chest wall ).    Abdominal: Soft.  Lymphadenopathy:    She has no cervical adenopathy.    She has no axillary adenopathy.  Neurological: She is alert and oriented to person, place, and time.  Skin: Skin is warm and dry.  Psychiatric: Her behavior is normal.    Data Reviewed Emergency department records.  Assessment    Likely chest wall pain.    Plan    The patient will be placed on Celebrex, 200 mg daily area she'll be asked to give a phone report in 2 weeks.     Call the office to do a phone report in 2 weeks on how the tenderness is resolving.  PCP:  Carolin Coy 12/14/2014, 8:41 PM

## 2014-12-14 NOTE — Patient Instructions (Signed)
Call the office to do a phone report in 2 weeks on how the tenderness is resolving.

## 2015-01-28 ENCOUNTER — Encounter: Payer: Self-pay | Admitting: Cardiovascular Disease

## 2015-01-28 ENCOUNTER — Ambulatory Visit (INDEPENDENT_AMBULATORY_CARE_PROVIDER_SITE_OTHER): Payer: PRIVATE HEALTH INSURANCE | Admitting: Cardiovascular Disease

## 2015-01-28 VITALS — BP 140/80 | HR 76 | Ht 63.0 in | Wt 144.5 lb

## 2015-01-28 DIAGNOSIS — E785 Hyperlipidemia, unspecified: Secondary | ICD-10-CM

## 2015-01-28 DIAGNOSIS — R079 Chest pain, unspecified: Secondary | ICD-10-CM | POA: Diagnosis not present

## 2015-01-28 DIAGNOSIS — F411 Generalized anxiety disorder: Secondary | ICD-10-CM

## 2015-01-28 DIAGNOSIS — Z716 Tobacco abuse counseling: Secondary | ICD-10-CM

## 2015-01-28 DIAGNOSIS — R0789 Other chest pain: Secondary | ICD-10-CM | POA: Diagnosis not present

## 2015-01-28 DIAGNOSIS — R9431 Abnormal electrocardiogram [ECG] [EKG]: Secondary | ICD-10-CM | POA: Diagnosis not present

## 2015-01-28 NOTE — Assessment & Plan Note (Signed)
We have encouraged her to continue to work on weaning her cigarettes and smoking cessation. She will continue to work on this and does not want any assistance with chantix.  

## 2015-01-28 NOTE — Assessment & Plan Note (Signed)
She takes Xanax when necessary for anxiety

## 2015-01-28 NOTE — Assessment & Plan Note (Signed)
Recent episode of chest wall pain. Evaluated in the emergency room, cardiac enzymes negative, EKG unchanged

## 2015-01-28 NOTE — Assessment & Plan Note (Signed)
Long history of abnormal EKG, seen on today's visit and unchanged from prior EKG in 2011 High risk of coronary artery disease given her long smoking history, hyperlipidemia, family history CT coronary calcium score has been ordered

## 2015-01-28 NOTE — Progress Notes (Signed)
Patient ID: Maureen [name], female    DOB: 01/17/1946, 69 y.o.   MRN: [id number]  HPI Comments: Maureen [name]  is a 69 year old woman with history of rest cancer, bilateral mastectomy, long history of smoking, hyperlipidemia who continues to smoke, strong family history of coronary artery disease who presents for evaluation of recent episodes of chest pain. Significant problems with anxiety and panic attack She reports having abnormal EKG in the past. She had catheterization in 2004 which showed no significant coronary artery disease by her report Prior stress test with primary care physician, Maureen [name]  Recently had episode of chest pain one month ago, felt sharp around her left breast going through to her back, only lasted for a second, associated with rest. Since then with no further episodes of chest pain. She was encouraged  to go to the emergency room Workup there reviewed, EKG unchanged, cardiac enzymes negative, felt to have no acute ischemia Otherwise active at baseline  She reports that she continues to smoke one pack per day,  recent total cholesterol 250  Prior EKG from 2011 showing ST and T-wave abnormalities to the precordial leads, anterolateral leads  EKG on today's visit shows normal sinus rhythm with rate 76 bpm, nonspecific ST and T wave abnormality through the precordial leads  She currently works for the [employer] department       Allergies  Allergen Reactions  . Codeine Itching  . Sulfa Antibiotics Hives  . Sulfinpyrazone   . Erythromycin Rash  . Penicillins Rash    Current Outpatient Prescriptions on File Prior to Visit  Medication Sig Dispense Refill  . acetaminophen (TYLENOL) 500 MG tablet Take 500 mg by mouth every 6 (six) hours as needed for headache.    . albuterol (VENTOLIN HFA) 108 (90 BASE) MCG/ACT inhaler Inhale 2 puffs into the lungs every 6 (six) hours as needed.    . ALPRAZolam (XANAX) 0.5 MG tablet Take 1 tablet (0.5 mg total) by mouth 3  (three) times daily as needed. 90 tablet 1  . budesonide-formoterol (SYMBICORT) 160-4.5 MCG/ACT inhaler Inhale 2 puffs into the lungs 2 (two) times daily.    . celecoxib (CELEBREX) 200 MG capsule Take 1 capsule (200 mg total) by mouth daily. 30 capsule 2  . Cholecalciferol (VITAMIN D) 2000 UNITS CAPS Take 1 capsule by mouth every other day.    . clobetasol cream (TEMOVATE) 4.09 % Apply 1 application topically daily.    . fluticasone (FLONASE) 50 MCG/ACT nasal spray Place 2 sprays into both nostrils daily as needed for allergies.     . Homeopathic Products (ZICAM ALLERGY RELIEF NA) Place 2 sprays into the nose daily as needed (allergies).     . hydroxychloroquine (PLAQUENIL) 200 MG tablet Take 200 mg by mouth every other day.     Marland Kitchen omeprazole (PRILOSEC) 20 MG capsule Take 20 mg by mouth daily as needed (acid reflux).     . zinc gluconate 50 MG tablet Take 50 mg by mouth every other day.     No current facility-administered medications on file prior to visit.    Past Medical History  Diagnosis Date  . Discoid lupus 2005  . Personal history of tobacco use, presenting hazards to health   . Heart murmur 2005  . Arthritis 2005  . Rectal polyp 2008    villous adenoma; last colonoscopy 1/09 WNL  . Mammographic microcalcification 2011  . Hypercholesterolemia 2012  . Personal history of colonic polyps     Colon polyps  .  Ulcer   . Panic attack   . PUD (peptic ulcer disease)     Last EGD 1/09  . 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 This was a histologic grade 2 tumor measuring just under 1 cm in diameter. No invasive cancer was identified. ER 90%, PR 40%.  . Breast cancer, right breast ([place]) 1994    Invasive ductal carcinoma.The patient underwent right mastectomy in 1994 for invasive ductal carcinoma.  . Skin cancer, basal cell 2015    right cheek Mohs surgery at [facility]    Past Surgical History  Procedure  Laterality Date  . Colonoscopy w/ biopsies  [id number]    Dr. Bary Castilla. Normal exam 2009. Villous adenoma of the rectum in 2005 adenomatous polyp from the ascending colon and rectum in 2002.  . Breast biopsy  12/27/2009    right breast  . Upper gi endoscopy  2003, 2005, 2009, 2012, 2014    2012 showed mild reflux changes without evidence of Barrett's epithelial changes 2014 biopsies at 37 cm showed changes suggestive of reflux esophagitis. No metaplasia.  . Tonsillectomy  1997  . Vaginal hysterectomy  1977  . Simple mastectomy  1993    right  . Simple mastectomy  2011    left  . Appendectomy  2011  . Mastectomy      bilateral  . Colonoscopy N/A 08/05/2014    Procedure: COLONOSCOPY;  Surgeon: [name], MD;  Location: [facility] ENDOSCOPY;  EGD, tubular adenoma 1.  . Esophagogastroduodenoscopy N/A 08/05/2014    Procedure: ESOPHAGOGASTRODUODENOSCOPY (EGD);  Surgeon: [name], MD;  Location: [facility] ENDOSCOPY;  Service: Endoscopy;  Laterality: N/A;  . Cardiac catheterization  1994    Social History  reports that she has been smoking Cigarettes.  She has a 40 pack-year smoking history. She has never used smokeless tobacco. She reports that she drinks about 0.5 - 1.0 oz of alcohol per week. She reports that she does not use illicit drugs.  Family History family history includes Cancer in her maternal aunt and maternal grandmother.   Review of Systems  Constitutional: Negative.   Respiratory: Negative.   Cardiovascular: Positive for chest pain.  Gastrointestinal: Negative.   Musculoskeletal: Negative.   Neurological: Negative.   Hematological: Negative.   Psychiatric/Behavioral: Negative.   All other systems reviewed and are negative.   BP 140/80 mmHg  Pulse 76  Ht '5\' 3"'$  (1.6 m)  Wt 144 lb 8 oz (65.545 kg)  BMI 25.60 kg/m2  Physical Exam

## 2015-01-28 NOTE — Patient Instructions (Addendum)
You are doing well. No medication changes were made.  We will schedule a CT Coronary Calcium score for abn EKG, chest pain Monday, November 14 @ 11:00, please arrive @ 10:45 There is a one-time fee of $150.00 due at the time of your procedure  Please call us if you have new issues that need to be addressed before your next appt.  Your physician wants you to follow-up in: 6 months.  You will receive a reminder letter in the mail two months in advance. If you don't receive a letter, please call our office to schedule the follow-up appointment.  Coronary Calcium Scan A coronary calcium scan is an imaging test used to look for deposits of calcium and other fatty materials (plaques) in the inner lining of the blood vessels of your heart (coronary arteries). These deposits of calcium and plaques can partly clog and narrow the coronary arteries without producing any symptoms or warning signs. This puts you at risk for a heart attack. This test can detect these deposits before symptoms develop.  LET [facility] CARE PROVIDER KNOW ABOUT:  Any allergies you have.  All medicines you are taking, including vitamins, herbs, eye drops, creams, and over-the-counter medicines.  Previous problems you or members of your family have had with the use of anesthetics.  Any blood disorders you have.  Previous surgeries you have had.  Medical conditions you have.  Possibility of pregnancy, if this applies. RISKS AND COMPLICATIONS Generally, this is a safe procedure. However, as with any procedure, complications can occur. This test involves the use of radiation. Radiation exposure can be dangerous to a pregnant woman and her unborn baby. If you are pregnant, you should not have this procedure done.  BEFORE THE PROCEDURE There is no special preparation for the procedure. PROCEDURE  You will need to undress and put on a hospital gown. You will need to remove any jewelry around your neck or chest.  Sticky  electrodes are placed on your chest and are connected to an electrocardiogram (EKG or electrocardiography) machine to recorda tracing of the electrical activity of your heart.  A CT scanner will take pictures of your heart. During this time, you will be asked to lie still and hold your breath for 2-3 seconds while a picture is being taken of your heart. AFTER THE PROCEDURE   You will be allowed to get dressed.  You can return to your normal activities after the scan is done.   This information is not intended to replace advice given to you by your health care provider. Make sure you discuss any questions you have with your health care provider.   Document Released: 09/02/2007 Document Revised: 03/11/2013 Document Reviewed: 11/11/2012 Elsevier Interactive Patient Education [employer].

## 2015-01-28 NOTE — Assessment & Plan Note (Signed)
Cholesterol very high, discussed with her Recommended a CT coronary calcium score for risk stratification

## 2015-02-01 ENCOUNTER — Ambulatory Visit (INDEPENDENT_AMBULATORY_CARE_PROVIDER_SITE_OTHER)
Admission: RE | Admit: 2015-02-01 | Discharge: 2015-02-01 | Disposition: A | Discharge status: Home / Self Care | Payer: PRIVATE HEALTH INSURANCE | Source: Ambulatory Visit | Attending: Cardiovascular Disease | Admitting: Cardiovascular Disease

## 2015-02-01 DIAGNOSIS — R9431 Abnormal electrocardiogram [ECG] [EKG]: Secondary | ICD-10-CM

## 2015-02-01 DIAGNOSIS — R079 Chest pain, unspecified: Secondary | ICD-10-CM

## 2015-02-17 ENCOUNTER — Telehealth: Payer: Self-pay | Admitting: *Deleted

## 2015-02-17 NOTE — Telephone Encounter (Signed)
Patient wanted to [facility] the information to fax blood work  Results to Fax [phone] Attn: Manuela Schwartz or Beckie Busing.

## 2015-03-02 ENCOUNTER — Ambulatory Visit (INDEPENDENT_AMBULATORY_CARE_PROVIDER_SITE_OTHER): Payer: PRIVATE HEALTH INSURANCE | Admitting: Nurse Practitioner

## 2015-03-02 ENCOUNTER — Encounter: Payer: Self-pay | Admitting: Nurse Practitioner

## 2015-03-02 VITALS — BP 130/76 | HR 81 | Temp 98.0°F | Ht 63.0 in | Wt 143.0 lb

## 2015-03-02 DIAGNOSIS — Z0000 Encounter for general adult medical examination without abnormal findings: Secondary | ICD-10-CM | POA: Insufficient documentation

## 2015-03-02 DIAGNOSIS — F411 Generalized anxiety disorder: Secondary | ICD-10-CM

## 2015-03-02 DIAGNOSIS — Z716 Tobacco abuse counseling: Secondary | ICD-10-CM

## 2015-03-02 MED ORDER — ALPRAZOLAM 0.5 MG PO TABS: 0.5000 mg | ORAL_TABLET | Freq: Three times a day (TID) | ORAL | Status: DC | PRN

## 2015-03-02 NOTE — Progress Notes (Signed)
Patient ID: Maureen [name], female    DOB: [birth year]  Age: 69 y.o. MRN: [id number]  CC: Annual Exam   HPI Maureen [name] presents for Annual Physical Exam.   1) Health Maintenance-   Diet- eats at home mostly   Exercise- active   Immunizations- Flu- next week at [facility]  Mammogram- Double mastectomy   Bone Density- Between 3-5 years   Colonoscopy- 07/2014   Eye Exam- Glasses, April/May 2016   Dental Exam- Dentures upper and lower  LMP- Hysterectomy   Labs- Through the county   Fall- Neg.   Depression- Neg. Refills on xanax requested   Tobacco abuse- pt is going to be hypnotized in Jan.   History Dontavia has a past medical history of Discoid lupus (2005); Personal history of tobacco use, presenting hazards to health; Heart murmur (2005); Arthritis (2005); Rectal polyp (2008); Mammographic microcalcification (2011); Hypercholesterolemia (2012); Personal history of colonic polyps; Ulcer; Panic attack; PUD (peptic ulcer disease); Breast cancer, left breast ([place]) (2011); Breast cancer, right breast ([place]) (1994); and Skin cancer, basal cell (2015).   She has past surgical history that includes Colonoscopy w/ biopsies ([id number]); Breast biopsy (12/27/2009); Upper gi endoscopy (2003, 2005, 2009, 2012, 2014); Tonsillectomy (1997); Vaginal hysterectomy (1977); Simple mastectomy (1993); Simple mastectomy (2011); Appendectomy (2011); Mastectomy; Colonoscopy (N/A, 08/05/2014); Esophagogastroduodenoscopy (N/A, 08/05/2014); and Cardiac catheterization (1994).   Her family history includes Cancer in her maternal aunt and maternal grandmother.She reports that she has been smoking Cigarettes.  She has a 40 pack-year smoking history. She has never used smokeless tobacco. She reports that she drinks about 0.6 - 1.2 oz of alcohol per week. She reports that she does not use illicit drugs.  Outpatient Prescriptions Prior to Visit  Medication Sig Dispense Refill  . acetaminophen (TYLENOL) 500 MG tablet Take 500  mg by mouth every 6 (six) hours as needed for headache.    . albuterol (VENTOLIN HFA) 108 (90 BASE) MCG/ACT inhaler Inhale 2 puffs into the lungs every 6 (six) hours as needed.    . budesonide-formoterol (SYMBICORT) 160-4.5 MCG/ACT inhaler Inhale 2 puffs into the lungs 2 (two) times daily.    . celecoxib (CELEBREX) 200 MG capsule Take 1 capsule (200 mg total) by mouth daily. 30 capsule 2  . Cholecalciferol (VITAMIN D) 2000 UNITS CAPS Take 1 capsule by mouth every other day.    . clobetasol cream (TEMOVATE) 2.11 % Apply 1 application topically daily.    . fluticasone (FLONASE) 50 MCG/ACT nasal spray Place 2 sprays into both nostrils daily as needed for allergies.     . Homeopathic Products (ZICAM ALLERGY RELIEF NA) Place 2 sprays into the nose daily as needed (allergies).     . hydroxychloroquine (PLAQUENIL) 200 MG tablet Take 200 mg by mouth every other day.     Marland Kitchen omeprazole (PRILOSEC) 20 MG capsule Take 20 mg by mouth daily as needed (acid reflux).     Marland Kitchen RA KRILL OIL 500 MG CAPS Take 500 mg by mouth daily.    Marland Kitchen zinc gluconate 50 MG tablet Take 50 mg by mouth every other day.    . ALPRAZolam (XANAX) 0.5 MG tablet Take 1 tablet (0.5 mg total) by mouth 3 (three) times daily as needed. 90 tablet 1   No facility-administered medications prior to visit.    ROS Review of Systems  Constitutional: Negative for fever, chills, diaphoresis, fatigue and unexpected weight change.  HENT: Negative for tinnitus and trouble swallowing.   Eyes: Negative for visual disturbance.  Respiratory:  Negative for chest tightness, shortness of breath and wheezing.   Cardiovascular: Negative for chest pain, palpitations and leg swelling.  Gastrointestinal: Negative for nausea, vomiting, abdominal pain, diarrhea, constipation and blood in stool.  Endocrine: Negative for polydipsia, polyphagia and polyuria.  Genitourinary: Negative for dysuria, hematuria, vaginal discharge and vaginal pain.  Musculoskeletal: Negative for  myalgias, arthralgias and gait problem.  Skin: Negative for color change and rash.  Neurological: Negative for dizziness, weakness, numbness and headaches.  Hematological: Does not bruise/bleed easily.  Psychiatric/Behavioral: Negative for suicidal ideas and sleep disturbance. The patient is not nervous/anxious.     Objective:  BP 130/76 mmHg  Pulse 81  Temp(Src) 98 F (36.7 C) (Oral)  Ht '5\' 3"'$  (1.6 m)  Wt 143 lb (64.864 kg)  BMI 25.34 kg/m2  SpO2 96%  Physical Exam  Constitutional: She is oriented to person, place, and time. She appears well-developed and well-nourished. No distress.  HENT:  Head: Normocephalic and atraumatic.  Right Ear: External ear normal.  Left Ear: External ear normal.  Nose: Nose normal.  Mouth/Throat: Oropharynx is clear and moist. No oropharyngeal exudate.  TMs and canals clear bilaterally  Eyes: Conjunctivae and EOM are normal. Pupils are equal, round, and reactive to light. Right eye exhibits no discharge. Left eye exhibits no discharge. No scleral icterus.  Neck: Normal range of motion. Neck supple. No thyromegaly present.  Cardiovascular: Normal rate, regular rhythm, normal heart sounds and intact distal pulses.  Exam reveals no gallop and no friction rub.   No murmur heard. Pulmonary/Chest: Effort normal and breath sounds normal. No respiratory distress. She has no wheezes. She has no rales. She exhibits no tenderness.  Double mastectomy  Abdominal: Soft. Bowel sounds are normal. She exhibits no distension and no mass. There is no tenderness. There is no rebound and no guarding.  Overweight  Genitourinary:  Aged out of PAPs per recommendation  No complaints- deferred  Musculoskeletal: Normal range of motion. She exhibits no edema or tenderness.  Lymphadenopathy:    She has no cervical adenopathy.  Neurological: She is alert and oriented to person, place, and time. She has normal reflexes. No cranial nerve deficit. She exhibits normal muscle tone.  Coordination normal.  Skin: Skin is warm and dry. No rash noted. She is not diaphoretic. No erythema. No pallor.  Psychiatric: She has a normal mood and affect. Her behavior is normal. Judgment and thought content normal.      Assessment & Plan:   Peytan was seen today for annual exam.  Diagnoses and all orders for this visit:  Routine general medical examination at a health care facility  Tobacco abuse counseling  Other orders -     ALPRAZolam (XANAX) 0.5 MG tablet; Take 1 tablet (0.5 mg total) by mouth 3 (three) times daily as needed.   I am having Ms. Agne maintain her albuterol, omeprazole, clobetasol cream, budesonide-formoterol, fluticasone, Homeopathic Products (ZICAM ALLERGY RELIEF NA), hydroxychloroquine, acetaminophen, Vitamin D, zinc gluconate, celecoxib, RA KRILL OIL, and ALPRAZolam.  Meds ordered this encounter  Medications  . ALPRAZolam (XANAX) 0.5 MG tablet    Sig: Take 1 tablet (0.5 mg total) by mouth 3 (three) times daily as needed.    Dispense:  90 tablet    Refill:  1    Order Specific Question:  Supervising Provider    Answer:  Crecencio Mc [[id number]]     Follow-up: Return in about 1 year (around 03/01/2016) for CPE .

## 2015-03-02 NOTE — Patient Instructions (Signed)
Need Pneumovax- 23 (you had your first one the prevnar-13)  Zostavax- shingles vaccine  Menopause is a normal process in which your reproductive ability comes to an end. This process happens gradually over a span of months to years, usually between the ages of 16 and 2. Menopause is complete when you have missed 12 consecutive menstrual periods. It is important to talk with your health care provider about some of the most common conditions that affect postmenopausal women, such as heart disease, cancer, and bone loss (osteoporosis). Adopting a healthy lifestyle and getting preventive care can help to promote your health and wellness. Those actions can also lower your chances of developing some of these common conditions. WHAT SHOULD I KNOW ABOUT MENOPAUSE? During menopause, you may experience a number of symptoms, such as:  Moderate-to-severe hot flashes.  Night sweats.  Decrease in sex drive.  Mood swings.  Headaches.  Tiredness.  Irritability.  Memory problems.  Insomnia. Choosing to treat or not to treat menopausal changes is an individual decision that you make with your health care provider. WHAT SHOULD I KNOW ABOUT HORMONE REPLACEMENT THERAPY AND SUPPLEMENTS? Hormone therapy products are effective for treating symptoms that are associated with menopause, such as hot flashes and night sweats. Hormone replacement carries certain risks, especially as you become older. If you are thinking about using estrogen or estrogen with progestin treatments, discuss the benefits and risks with your health care provider. WHAT SHOULD I KNOW ABOUT HEART DISEASE AND STROKE? Heart disease, heart attack, and stroke become more likely as you age. This may be due, in part, to the hormonal changes that your body experiences during menopause. These can affect how your body processes dietary fats, triglycerides, and cholesterol. Heart attack and stroke are both medical emergencies. There are many things  that you can do to help prevent heart disease and stroke:  Have your blood pressure checked at least every 1-2 years. High blood pressure causes heart disease and increases the risk of stroke.  If you are 59-68 years old, ask your health care provider if you should take aspirin to prevent a heart attack or a stroke.  Do not use any tobacco products, including cigarettes, chewing tobacco, or electronic cigarettes. If you need help quitting, ask your health care provider.  It is important to eat a healthy diet and maintain a healthy weight.  Be sure to include plenty of vegetables, fruits, low-fat dairy products, and lean protein.  Avoid eating foods that are high in solid fats, added sugars, or salt (sodium).  Get regular exercise. This is one of the most important things that you can do for your health.  Try to exercise for at least 150 minutes each week. The type of exercise that you do should increase your heart rate and make you sweat. This is known as moderate-intensity exercise.  Try to do strengthening exercises at least twice each week. Do these in addition to the moderate-intensity exercise.  Know your numbers.Ask your health care provider to check your cholesterol and your blood glucose. Continue to have your blood tested as directed by your health care provider. WHAT SHOULD I KNOW ABOUT CANCER SCREENING? There are several types of cancer. Take the following steps to reduce your risk and to catch any cancer development as early as possible. Breast Cancer  Practice breast self-awareness.  This means understanding how your breasts normally appear and feel.  It also means doing regular breast self-exams. Let your health care provider know about any changes, no  matter how small.  If you are 53 or older, have a clinician do a breast exam (clinical breast exam or CBE) every year. Depending on your age, family history, and medical history, it may be recommended that you also have a  yearly breast X-ray (mammogram).  If you have a family history of breast cancer, talk with your health care provider about genetic screening.  If you are at high risk for breast cancer, talk with your health care provider about having an MRI and a mammogram every year.  Breast cancer (BRCA) gene test is recommended for women who have family members with BRCA-related cancers. Results of the assessment will determine the need for genetic counseling and BRCA1 and for BRCA2 testing. BRCA-related cancers include these types:  Breast. This occurs in males or females.  Ovarian.  Tubal. This may also be called fallopian tube cancer.  Cancer of the abdominal or pelvic lining (peritoneal cancer).  Prostate.  Pancreatic. Cervical, Uterine, and Ovarian Cancer Your health care provider may recommend that you be screened regularly for cancer of the pelvic organs. These include your ovaries, uterus, and vagina. This screening involves a pelvic exam, which includes checking for microscopic changes to the surface of your cervix (Pap test).  For women ages 21-65, health care providers may recommend a pelvic exam and a Pap test every three years. For women ages 61-65, they may recommend the Pap test and pelvic exam, combined with testing for human papilloma virus (HPV), every five years. Some types of HPV increase your risk of cervical cancer. Testing for HPV may also be done on women of any age who have unclear Pap test results.  Other health care providers may not recommend any screening for nonpregnant women who are considered low risk for pelvic cancer and have no symptoms. Ask your health care provider if a screening pelvic exam is right for you.  If you have had past treatment for cervical cancer or a condition that could lead to cancer, you need Pap tests and screening for cancer for at least 20 years after your treatment. If Pap tests have been discontinued for you, your risk factors (such as having a  new sexual partner) need to be reassessed to determine if you should start having screenings again. Some women have medical problems that increase the chance of getting cervical cancer. In these cases, your health care provider may recommend that you have screening and Pap tests more often.  If you have a family history of uterine cancer or ovarian cancer, talk with your health care provider about genetic screening.  If you have vaginal bleeding after reaching menopause, tell your health care provider.  There are currently no reliable tests available to screen for ovarian cancer. Lung Cancer Lung cancer screening is recommended for adults 76-52 years old who are at high risk for lung cancer because of a history of smoking. A yearly low-dose CT scan of the lungs is recommended if you:  Currently smoke.  Have a history of at least 30 pack-years of smoking and you currently smoke or have quit within the past 15 years. A pack-year is smoking an average of one pack of cigarettes per day for one year. Yearly screening should:  Continue until it has been 15 years since you quit.  Stop if you develop a health problem that would prevent you from having lung cancer treatment. Colorectal Cancer  This type of cancer can be detected and can often be prevented.  Routine colorectal cancer  screening usually begins at age 94 and continues through age 29.  If you have risk factors for colon cancer, your health care provider may recommend that you be screened at an earlier age.  If you have a family history of colorectal cancer, talk with your health care provider about genetic screening.  Your health care provider may also recommend using home test kits to check for hidden blood in your stool.  A small camera at the end of a tube can be used to examine your colon directly (sigmoidoscopy or colonoscopy). This is done to check for the earliest forms of colorectal cancer.  Direct examination of the colon  should be repeated every 5-10 years until age 80. However, if early forms of precancerous polyps or small growths are found or if you have a family history or genetic risk for colorectal cancer, you may need to be screened more often. Skin Cancer  Check your skin from head to toe regularly.  Monitor any moles. Be sure to tell your health care provider:  About any new moles or changes in moles, especially if there is a change in a mole's shape or color.  If you have a mole that is larger than the size of a pencil eraser.  If any of your family members has a history of skin cancer, especially at a young age, talk with your health care provider about genetic screening.  Always use sunscreen. Apply sunscreen liberally and repeatedly throughout the day.  Whenever you are outside, protect yourself by wearing long sleeves, pants, a wide-brimmed hat, and sunglasses. WHAT SHOULD I KNOW ABOUT OSTEOPOROSIS? Osteoporosis is a condition in which bone destruction happens more quickly than new bone creation. After menopause, you may be at an increased risk for osteoporosis. To help prevent osteoporosis or the bone fractures that can happen because of osteoporosis, the following is recommended:  If you are 90-81 years old, get at least 1,000 mg of calcium and at least 600 mg of vitamin D per day.  If you are older than age 71 but younger than age 53, get at least 1,200 mg of calcium and at least 600 mg of vitamin D per day.  If you are older than age 64, get at least 1,200 mg of calcium and at least 800 mg of vitamin D per day. Smoking and excessive alcohol intake increase the risk of osteoporosis. Eat foods that are rich in calcium and vitamin D, and do weight-bearing exercises several times each week as directed by your health care provider. WHAT SHOULD I KNOW ABOUT HOW MENOPAUSE AFFECTS [place]? Depression may occur at any age, but it is more common as you become older. Common symptoms of  depression include:  Low or sad mood.  Changes in sleep patterns.  Changes in appetite or eating patterns.  Feeling an overall lack of motivation or enjoyment of activities that you previously enjoyed.  Frequent crying spells. Talk with your health care provider if you think that you are experiencing depression. WHAT SHOULD I KNOW ABOUT IMMUNIZATIONS? It is important that you get and maintain your immunizations. These include:  Tetanus, diphtheria, and pertussis (Tdap) booster vaccine.  Influenza every year before the flu season begins.  Pneumonia vaccine.  Shingles vaccine. Your health care provider may also recommend other immunizations.   This information is not intended to replace advice given to you by your health care provider. Make sure you discuss any questions you have with your health care provider.  Document Released: 04/28/2005 Document Revised: 03/27/2014 Document Reviewed: 11/06/2013 Elsevier Interactive Patient Education [employer].

## 2015-03-09 ENCOUNTER — Encounter: Payer: Self-pay | Admitting: Nurse Practitioner

## 2015-03-09 NOTE — Assessment & Plan Note (Signed)
Patient is stable on Xanax. Will give refill. Takes when necessary for anxiety.

## 2015-03-09 NOTE — Assessment & Plan Note (Addendum)
Discussed acute and chronic issues. Reviewed health maintenance measures, PFSHx, and immunizations. Obtain Labs through the county. Will follow after we receive results.   Health Maintenance UTD Will ask the county wellness group to do Pneumovax

## 2015-03-09 NOTE — Assessment & Plan Note (Signed)
Patient has been advised by multiple providers to quit smoking. She can try hypnotist in January.

## 2015-03-19 ENCOUNTER — Ambulatory Visit: Payer: Self-pay | Admitting: Family

## 2015-03-19 ENCOUNTER — Encounter: Payer: Self-pay | Admitting: Family

## 2015-03-19 VITALS — BP 129/70 | HR 91 | Temp 97.9°F

## 2015-03-19 DIAGNOSIS — J329 Chronic sinusitis, unspecified: Secondary | ICD-10-CM

## 2015-03-19 MED ORDER — LEVOFLOXACIN 500 MG PO TABS: 500.0000 mg | ORAL_TABLET | Freq: Every day | ORAL | Status: DC

## 2015-03-19 MED ORDER — PREDNISONE 10 MG PO TABS: 30.0000 mg | ORAL_TABLET | Freq: Every day | ORAL | Status: DC

## 2015-03-19 NOTE — Progress Notes (Signed)
S smoker , copd , acute sxs increased cough sinus pain and pressure , fatigue , coughing so much back is sore  O/ alert pleasant NAD , VSS . ENT nasal mucosa atrophic , frontal tenderness, throat clear  Neck supple heart rsr lungs clear decreased BS throughout  A/ copd with exacerbation  P/ levaquin 500 mg , pred pulse, supportive measures , seek urgent care if not improving.

## 2015-04-15 ENCOUNTER — Other Ambulatory Visit: Payer: Self-pay | Admitting: Physician Assistant

## 2015-04-19 ENCOUNTER — Other Ambulatory Visit: Payer: Self-pay | Admitting: Emergency Medicine

## 2015-04-19 MED ORDER — BUDESONIDE-FORMOTEROL FUMARATE 160-4.5 MCG/ACT IN AERO: 2.0000 | INHALATION_SPRAY | Freq: Two times a day (BID) | RESPIRATORY_TRACT | Status: DC

## 2015-04-19 NOTE — Telephone Encounter (Signed)
Med refill approved 

## 2015-04-19 NOTE — Telephone Encounter (Signed)
Received a faxed medication request from [facility] in [place].  Please advise.  Thank you.

## 2015-05-10 ENCOUNTER — Encounter: Payer: Self-pay | Admitting: Physician Assistant

## 2015-05-10 ENCOUNTER — Ambulatory Visit: Payer: Self-pay | Admitting: Physician Assistant

## 2015-05-10 VITALS — BP 110/70 | HR 107 | Temp 98.5°F

## 2015-05-10 DIAGNOSIS — J069 Acute upper respiratory infection, unspecified: Secondary | ICD-10-CM

## 2015-05-10 MED ORDER — METHYLPREDNISOLONE 4 MG PO TBPK: ORAL_TABLET | ORAL | Status: DC

## 2015-05-10 MED ORDER — LEVOFLOXACIN 500 MG PO TABS: 500.0000 mg | ORAL_TABLET | Freq: Every day | ORAL | Status: DC

## 2015-05-10 NOTE — Progress Notes (Signed)
S: C/o cough and congestion with wheezing , chest is sore from coughing, denies fever, did have some chills, cough is rattling and  hacking; keeping pt awake at night;  denies cardiac type chest pain or sob, v/d, abd pain Remainder ros neg; family members have same sx. + smoker  O: vitals wnl, nad, tms clear, throat injected, neck supple no lymph, lungs with wheezing just in left upper lung, clears with cough, cv rrr, neuro intact  A:  Acute bronchitis   P:  rx medication: levaquin '500mg'$  qd, medrol dose pack ; smoking cessation, use otc meds, tylenol or motrin as needed for fever/chills, return if not better in 3 -5 days, return earlier if worsening

## 2015-06-14 ENCOUNTER — Encounter: Payer: Self-pay | Admitting: General Surgery

## 2015-06-14 ENCOUNTER — Ambulatory Visit (INDEPENDENT_AMBULATORY_CARE_PROVIDER_SITE_OTHER): Payer: Managed Care, Other (non HMO) | Admitting: General Surgery

## 2015-06-14 VITALS — BP 118/78 | HR 100 | Resp 16 | Wt 141.0 lb

## 2015-06-14 DIAGNOSIS — Z853 Personal history of malignant neoplasm of breast: Secondary | ICD-10-CM | POA: Diagnosis not present

## 2015-06-14 NOTE — Progress Notes (Signed)
Patient ID: Maureen [name], female   DOB: [birth year], 70 y.o.   MRN: [id number]  Chief Complaint  Patient presents with  . Follow-up    1 yr breast cancer    HPI Maureen [name] is a 70 y.o. female.  Has been doing fine since last visit.   Has had persistent, intermittent upper left side chest pain since back in September.  Has had heart with Tanda Rockers, M.D. in regards to her left chest pain. This included a cardiac calcium CT which reported no calcium in the coronary vessels. There has been no progression in the intensity or frequency of her episodes of discomfort.  The patient does not give a history of pain with activity, shortness of breath or radiation in the neck or left upper extremity.     HPI  Past Medical History  Diagnosis Date  . Discoid lupus 2005  . Personal history of tobacco use, presenting hazards to health   . Heart murmur 2005  . Arthritis 2005  . Rectal polyp 2008    villous adenoma; last colonoscopy 1/09 WNL  . Mammographic microcalcification 2011  . Hypercholesterolemia 2012  . Personal history of colonic polyps     Colon polyps  . Ulcer   . Panic attack   . PUD (peptic ulcer disease)     Last EGD 1/09  . 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 This was a histologic grade 2 tumor measuring just under 1 cm in diameter. No invasive cancer was identified. ER 90%, PR 40%.  . 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  . Skin cancer, basal cell 2015    right cheek Mohs surgery at [facility]    Past Surgical History  Procedure Laterality Date  . Colonoscopy w/ biopsies  3267,1245    Dr. Bary Castilla. Normal exam 2009. Villous adenoma of the rectum in 2005 adenomatous polyp from the ascending colon and rectum in 2002.  . Breast biopsy  12/27/2009    right breast  . Upper gi endoscopy   2003, 2005, 2009, 2012, 2014    2012 showed mild reflux changes without evidence of Barrett's epithelial changes 2014 biopsies at 37 cm showed changes suggestive of reflux esophagitis. No metaplasia.  . Tonsillectomy  1997  . Vaginal hysterectomy  1977  . Simple mastectomy  1993    right  . Simple mastectomy  2011    left  . Appendectomy  2011  . Mastectomy      bilateral  . Colonoscopy N/A 08/05/2014    Procedure: COLONOSCOPY;  Surgeon: [name], MD;  Location: [facility] ENDOSCOPY;  EGD, tubular adenoma 1.  . Esophagogastroduodenoscopy N/A 08/05/2014    Procedure: ESOPHAGOGASTRODUODENOSCOPY (EGD);  Surgeon: [name], MD;  Location: [facility] ENDOSCOPY;  Service: Endoscopy;  Laterality: N/A;  . Cardiac catheterization  1994    Family History  Problem Relation Age of Onset  . Cancer Maternal Aunt     breast cancer  . Cancer Maternal Grandmother     breast cancer    Social History Social History  Substance Use Topics  . Smoking status: Current Every Day Smoker -- 1.00 packs/day for 40 years    Types: Cigarettes  . Smokeless tobacco: Never Used     Comment: Pt wants to do hypnotizing  . Alcohol Use: 0.6 - 1.2 oz/week  1-2 Standard drinks or equivalent per week    Allergies  Allergen Reactions  . Codeine Itching  . Sulfa Antibiotics Hives  . Sulfinpyrazone   . Erythromycin Rash  . Penicillins Rash    Current Outpatient Prescriptions  Medication Sig Dispense Refill  . acetaminophen (TYLENOL) 500 MG tablet Take 500 mg by mouth every 6 (six) hours as needed for headache.    . albuterol (VENTOLIN HFA) 108 (90 BASE) MCG/ACT inhaler Inhale 2 puffs into the lungs every 6 (six) hours as needed.    . ALPRAZolam (XANAX) 0.5 MG tablet Take 1 tablet (0.5 mg total) by mouth 3 (three) times daily as needed. 90 tablet 1  . budesonide-formoterol (SYMBICORT) 160-4.5 MCG/ACT inhaler Inhale 2 puffs into the lungs 2 (two) times daily. 1 Inhaler 6  . celecoxib (CELEBREX) 200 MG  capsule Take 1 capsule (200 mg total) by mouth daily. 30 capsule 2  . Cholecalciferol (VITAMIN D) 2000 UNITS CAPS Take 1 capsule by mouth every other day.    . clobetasol cream (TEMOVATE) 6.64 % Apply 1 application topically daily.    . fluticasone (FLONASE) 50 MCG/ACT nasal spray Place 2 sprays into both nostrils daily as needed for allergies.     . Homeopathic Products (ZICAM ALLERGY RELIEF NA) Place 2 sprays into the nose daily as needed (allergies).     . hydroxychloroquine (PLAQUENIL) 200 MG tablet Take 200 mg by mouth every other day.     . levofloxacin (LEVAQUIN) 500 MG tablet Take 1 tablet (500 mg total) by mouth daily. 7 tablet 0  . methylPREDNISolone (MEDROL DOSEPAK) 4 MG TBPK tablet Take 6 pills on day one then decrease by 1 pill each day 21 tablet 0  . omeprazole (PRILOSEC) 20 MG capsule Take 20 mg by mouth daily as needed (acid reflux).     . predniSONE (DELTASONE) 10 MG tablet Take 3 tablets (30 mg total) by mouth daily with breakfast. 9 tablet 0  . RA KRILL OIL 500 MG CAPS Take 500 mg by mouth daily.    Marland Kitchen zinc gluconate 50 MG tablet Take 50 mg by mouth every other day.     No current facility-administered medications for this visit.    Review of Systems Review of Systems  Constitutional: Negative.   Respiratory: Negative.   Cardiovascular: Negative.     Blood pressure 118/78, pulse 100, resp. rate 16, weight 141 lb (63.957 kg).  Physical Exam Physical Exam  Constitutional: She is oriented to person, place, and time. She appears well-developed and well-nourished.  Eyes: Conjunctivae are normal. No scleral icterus.  Neck: Neck supple.  Cardiovascular: Normal rate, regular rhythm and normal heart sounds.   Pulmonary/Chest: Effort normal and breath sounds normal.    Lymphadenopathy:    She has no cervical adenopathy.  Neurological: She is alert and oriented to person, place, and time.  Skin: Skin is warm and dry.    Data Reviewed Cardiology  evaluation  Assessment    No evidence of recurrent breast cancer.  Unexplained left sided chest pain, negative cardiac evaluation.    Plan    Unclear source for intermittent left chest wall pain. No obvious cardiac source. She might be a candidate for anti-inflammatory therapy, but on her medication list included that she was taking Celebrex, which she reports she is not taking. Before instituting any therapy, we'll have to get an up-to-date medical list.    Reviewing her medications showed there were several that she did not think she was taking, and others  that the dosing frequency was unclear. She has been asked to fax a copy of her active medications and frequency to the office by tomorrow.  Patient to return in one year.  This information has been scribed by Gaspar Cola CMA.   [name] 06/15/2015, 10:45 AM

## 2015-06-14 NOTE — Patient Instructions (Signed)
Patient to return in one year. 

## 2015-08-02 ENCOUNTER — Other Ambulatory Visit: Payer: Self-pay | Admitting: Nurse Practitioner

## 2015-08-02 NOTE — Telephone Encounter (Signed)
Xanax refill request.  Formerly [facility] patient.  Last seen 12/16.  Last filled 12/16.  Please advise.

## 2015-10-14 ENCOUNTER — Other Ambulatory Visit: Payer: Self-pay | Admitting: Nurse Practitioner

## 2015-10-14 NOTE — Telephone Encounter (Signed)
Patient has not been seen in the office since 02/2015. Patient needs appointment before having refills per Dr. Lacinda Axon

## 2015-10-27 ENCOUNTER — Encounter (INDEPENDENT_AMBULATORY_CARE_PROVIDER_SITE_OTHER): Payer: Self-pay

## 2015-10-27 ENCOUNTER — Ambulatory Visit (INDEPENDENT_AMBULATORY_CARE_PROVIDER_SITE_OTHER): Payer: Managed Care, Other (non HMO) | Admitting: Family

## 2015-10-27 ENCOUNTER — Encounter: Payer: Self-pay | Admitting: Family

## 2015-10-27 VITALS — BP 130/70 | HR 81 | Temp 97.7°F | Wt 145.2 lb

## 2015-10-27 DIAGNOSIS — F411 Generalized anxiety disorder: Secondary | ICD-10-CM | POA: Diagnosis not present

## 2015-10-27 MED ORDER — ALPRAZOLAM 0.5 MG PO TABS: ORAL_TABLET | ORAL | 0 refills | Status: DC

## 2015-10-27 NOTE — Assessment & Plan Note (Addendum)
Patient has a long-standing history of anxiety with panic attacks. She uses the medication as prescribed. We discussed at great length alternatives to using benzodiazepines for better management of anxiety. She politely declined. She also politely declined any counseling at this time as she plans to see a hypnotist to help her with her smoking as well as anxiety. When I brought up alternative medications (wellbutrin) or seeing counseling for anxiety, patient was quite resistant to this idea. I advised patient that I am very conservative with benzodiazepines and especially particularly as people age. I do not see them as a long-term, safe solution to her anxiety. Patient is due for physical, and I encouraged her to make this appointment either with  myself or another provider if she so chooses. Refilled Xanax.

## 2015-10-27 NOTE — Progress Notes (Signed)
Pre visit review using our clinic review tool, if applicable. No additional management support is needed unless otherwise documented below in the visit note. 

## 2015-10-27 NOTE — Progress Notes (Signed)
Subjective:    Patient ID: Maureen [name], female    DOB: March 03, 1946, 70 y.o.   MRN: [id number]  CC: Maureen [name] is a 70 y.o. female who presents today for follow up.   HPI: Here to establish care and follow-up on anxiety, requesting refill. Tried to refill Xanax however Dr. Thomes [name] DEA stopped working.  Panic Attacks: Usually occur while driving. Takes Xanax for panic attacks, has been on Xanax for past 30 years. Uses one to two per day. Has been treated by Maureen [name] psychiatrist in past. Plans to use hypnostist for anxiety and smoking, has done this in the past with good results. Declines counseling referral today.   Will do CPE at next visit.     HISTORY:  Past Medical History:  Diagnosis Date  . Arthritis 2005  . 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 This was a histologic grade 2 tumor measuring just under 1 cm in diameter. No invasive cancer was identified. ER 90%, PR 40%.  . Breast cancer, right breast ([place]) 1994   Invasive ductal carcinoma.The patient underwent right mastectomy in 1994 for invasive ductal carcinoma.  . Discoid lupus 2005  . Heart murmur 2005  . Hypercholesterolemia 2012  . Mammographic microcalcification 2011  . Panic attack   . Personal history of colonic polyps    Colon polyps  . Personal history of tobacco use, presenting hazards to health   . PUD (peptic ulcer disease)    Last EGD 1/09  . Rectal polyp 2008   villous adenoma; last colonoscopy 1/09 WNL  . Skin cancer, basal cell 2015   right cheek Mohs surgery at [facility]  . Ulcer    Past Surgical History:  Procedure Laterality Date  . APPENDECTOMY  2011  . BREAST BIOPSY  12/27/2009   right breast  . [place]  . COLONOSCOPY N/A 08/05/2014   Procedure: COLONOSCOPY;  Surgeon: [name], MD;  Location: [facility] ENDOSCOPY;  EGD, tubular adenoma 1.  . COLONOSCOPY W/ BIOPSIES  2005,2009   Dr.  Bary Castilla. Normal exam 2009. Villous adenoma of the rectum in 2005 adenomatous polyp from the ascending colon and rectum in 2002.  . ESOPHAGOGASTRODUODENOSCOPY N/A 08/05/2014   Procedure: ESOPHAGOGASTRODUODENOSCOPY (EGD);  Surgeon: [name], MD;  Location: [facility] ENDOSCOPY;  Service: Endoscopy;  Laterality: N/A;  . MASTECTOMY     bilateral  . SIMPLE MASTECTOMY  1993   right  . SIMPLE MASTECTOMY  2011   left  . TONSILLECTOMY  1997  . UPPER GI ENDOSCOPY  2003, 2005, 2009, 2012, 2014   2012 showed mild reflux changes without evidence of Barrett's epithelial changes 2014 biopsies at 37 cm showed changes suggestive of reflux esophagitis. No metaplasia.  Marland Kitchen VAGINAL HYSTERECTOMY  1977   Family History  Problem Relation Age of Onset  . Cancer Maternal Aunt     breast cancer  . Cancer Maternal Grandmother     breast cancer    Allergies: Codeine; Sulfa antibiotics; Sulfinpyrazone; Erythromycin; and Penicillins Current Outpatient Prescriptions on File Prior to Visit  Medication Sig Dispense Refill  . acetaminophen (TYLENOL) 500 MG tablet Take 500 mg by mouth every 6 (six) hours as needed for headache.    . budesonide-formoterol (SYMBICORT) 160-4.5 MCG/ACT inhaler Inhale 2 puffs into the lungs 2 (two) times daily. 1 Inhaler 6  . Cholecalciferol (VITAMIN D) 2000 UNITS CAPS Take 1 capsule by mouth every other day.    Marland Kitchen  clobetasol cream (TEMOVATE) 0.17 % Apply 1 application topically daily.    . Homeopathic Products (ZICAM ALLERGY RELIEF NA) Place 2 sprays into the nose daily as needed (allergies).     Marland Kitchen omeprazole (PRILOSEC) 20 MG capsule Take 20 mg by mouth daily as needed (acid reflux).     Marland Kitchen RA KRILL OIL 500 MG CAPS Take 500 mg by mouth daily.    Marland Kitchen zinc gluconate 50 MG tablet Take 50 mg by mouth every other day.     No current facility-administered medications on file prior to visit.     Social History  Substance Use Topics  . Smoking status: Current Every Day Smoker    Packs/day:  1.00    Years: 40.00    Types: Cigarettes  . Smokeless tobacco: Never Used     Comment: Pt wants to do hypnotizing  . Alcohol use 0.6 - 1.2 oz/week    1 - 2 Standard drinks or equivalent per week    Review of Systems  Constitutional: Negative for chills and fever.  Respiratory: Negative for cough.   Cardiovascular: Negative for chest pain and palpitations.  Gastrointestinal: Negative for nausea and vomiting.      Objective:    BP 130/70 (BP Location: Right Arm, Patient Position: Sitting, Cuff Size: Normal)   Pulse 81   Temp 97.7 F (36.5 C) (Oral)   Wt 145 lb 3.2 oz (65.9 kg)   SpO2 98%   BMI 25.72 kg/m  BP Readings from Last 3 Encounters:  10/27/15 130/70  06/14/15 118/78  05/10/15 110/70   Wt Readings from Last 3 Encounters:  10/27/15 145 lb 3.2 oz (65.9 kg)  06/14/15 141 lb (64 kg)  03/02/15 143 lb (64.9 kg)    Physical Exam  Constitutional: She appears well-developed and well-nourished.  Eyes: Conjunctivae are normal.  Cardiovascular: Normal rate, regular rhythm, normal heart sounds and normal pulses.   Pulmonary/Chest: Effort normal and breath sounds normal. She has no wheezes. She has no rhonchi. She has no rales.  Neurological: She is alert.  Skin: Skin is warm and dry.  Psychiatric: She has a normal mood and affect. Her speech is normal and behavior is normal. Thought content normal.  Vitals reviewed.      Assessment & Plan:   Problem List Items Addressed This Visit      Other   Generalized anxiety disorder    Patient has a long-standing history of anxiety with panic attacks. She uses the medication as prescribed. We discussed at great length alternatives to using benzodiazepines for better management of anxiety. She politely declined. She also politely declined any counseling at this time as she plans to see a hypnotist to help her with her smoking as well as anxiety. When I brought up alternative medications (wellbutrin) or seeing counseling for  anxiety, patient was quite resistant to this idea. I advised patient that I am very conservative with benzodiazepines and especially particularly as people age. I do not see them as a long-term, safe solution to her anxiety. Patient is due for physical, and I encouraged her to make this appointment either with  myself or another provider if she so chooses. Refilled Xanax.       Other Visit Diagnoses    Anxiety state    -  Primary   Relevant Medications   ALPRAZolam (XANAX) 0.5 MG tablet       I have discontinued Ms. Amodei's albuterol, fluticasone, hydroxychloroquine, celecoxib, predniSONE, levofloxacin, and methylPREDNISolone. I am also having her  maintain her omeprazole, clobetasol cream, Homeopathic Products (ZICAM ALLERGY RELIEF NA), acetaminophen, Vitamin D, zinc gluconate, RA KRILL OIL, budesonide-formoterol, and ALPRAZolam.   Meds ordered this encounter  Medications  . ALPRAZolam (XANAX) 0.5 MG tablet    Sig: TAKE ONE TABLET BY MOUTH 3 TIMES DAILY AS NEEDED    Dispense:  90 tablet    Refill:  0    Not to exceed 4 additional fills before 08/29/2015    Order Specific Question:   Supervising Provider    Answer:   Crecencio Mc [[id number]]    Return precautions given.   Risks, benefits, and alternatives of the medications and treatment plan prescribed today were discussed, and patient expressed understanding.   Education regarding symptom management and diagnosis given to patient on AVS.  Continue to follow with Mable Paris, FNP for routine health maintenance.   Maureen [name] and I agreed with plan.   Mable Paris, FNP  Total of 25 minutes spent with patient, greater than 50% of which was spent in discussion of anxiety and risks of BZDs.

## 2015-10-27 NOTE — Patient Instructions (Addendum)
It was my pleasure meeting you today. As we discussed, and her subsequent visits, I would like to discuss alternative treatment to your anxiety or having as opposed to the benzodiazepines. We discussed the risks of these medications. Please consider what we talked about today and know that I will respect what you ultimately decide.   Please make f/u for CPE and preventative care this fall.     Generalized Anxiety Disorder Generalized anxiety disorder (GAD) is a mental disorder. It interferes with life functions, including relationships, work, and school. GAD is different from normal anxiety, which everyone experiences at some point in their lives in response to specific life events and activities. Normal anxiety actually helps [place] prepare for and get through these life events and activities. Normal anxiety goes away after the event or activity is over.  GAD causes anxiety that is not necessarily related to specific events or activities. It also causes excess anxiety in proportion to specific events or activities. The anxiety associated with GAD is also difficult to control. GAD can vary from mild to severe. People with severe GAD can have intense waves of anxiety with physical symptoms (panic attacks).  SYMPTOMS The anxiety and worry associated with GAD are difficult to control. This anxiety and worry are related to many life events and activities and also occur more days than not for 6 months or longer. People with GAD also have three or more of the following symptoms (one or more in children):  Restlessness.   Fatigue.  Difficulty concentrating.   Irritability.  Muscle tension.  Difficulty sleeping or unsatisfying sleep. DIAGNOSIS GAD is diagnosed through an assessment by your health care provider. Your health care provider will ask you questions aboutyour mood,physical symptoms, and events in your life. Your health care provider may ask you about your medical history and use of alcohol  or drugs, including prescription medicines. Your health care provider may also do a physical exam and blood tests. Certain medical conditions and the use of certain substances can cause symptoms similar to those associated with GAD. Your health care provider may refer you to a mental health specialist for further evaluation. TREATMENT The following therapies are usually used to treat GAD:   Medication. Antidepressant medication usually is prescribed for long-term daily control. Antianxiety medicines may be added in severe cases, especially when panic attacks occur.   Talk therapy (psychotherapy). Certain types of talk therapy can be helpful in treating GAD by providing support, education, and guidance. A form of talk therapy called cognitive behavioral therapy can teach you healthy ways to think about and react to daily life events and activities.  Stress managementtechniques. These include yoga, meditation, and exercise and can be very helpful when they are practiced regularly. A mental health specialist can help determine which treatment is best for you. Some people see improvement with one therapy. However, other people require a combination of therapies.   This information is not intended to replace advice given to you by your health care provider. Make sure you discuss any questions you have with your health care provider.   Document Released: 07/01/2012 Document Revised: 03/27/2014 Document Reviewed: 07/01/2012 Elsevier Interactive Patient Education [employer].

## 2015-11-09 ENCOUNTER — Ambulatory Visit
Admission: RE | Admit: 2015-11-09 | Discharge: 2015-11-09 | Disposition: A | Discharge status: Home / Self Care | Payer: Managed Care, Other (non HMO) | Source: Ambulatory Visit | Attending: Physician Assistant | Admitting: Physician Assistant

## 2015-11-09 ENCOUNTER — Ambulatory Visit: Payer: Self-pay | Admitting: Physician Assistant

## 2015-11-09 ENCOUNTER — Encounter: Payer: Self-pay | Admitting: Physician Assistant

## 2015-11-09 VITALS — BP 138/80 | HR 92 | Temp 97.7°F

## 2015-11-09 DIAGNOSIS — M25562 Pain in left knee: Secondary | ICD-10-CM | POA: Insufficient documentation

## 2015-11-09 DIAGNOSIS — M25552 Pain in left hip: Secondary | ICD-10-CM | POA: Insufficient documentation

## 2015-11-09 MED ORDER — TRAMADOL HCL 50 MG PO TABS
50.0000 mg | ORAL_TABLET | Freq: Three times a day (TID) | ORAL | 0 refills | Status: DC | PRN
Start: 2015-11-09 — End: 2016-05-23

## 2015-11-09 NOTE — Progress Notes (Signed)
S: c/o left hip and left knee pain, states was swimming in the ocean and not sure what happened, had also stepped in a hole on the beach, no numbness or tingling, pain is keeping her awake at night, can't get comfortable due to hip pain, knee hurts bad too, no other injury  O: vitals wnl, nad, skin intact, left hip tender in joint space, pain reproduced with internal rotation of hip, legs = length, left knee tender at joint line, full rom, pt walks slowly due to pain, n/v intact  A: acute left hip and knee pain  P: xray left hip and knee, tramadol '50mg'$  #30 nr

## 2015-11-15 ENCOUNTER — Other Ambulatory Visit: Payer: Self-pay | Admitting: Family

## 2015-12-30 ENCOUNTER — Telehealth: Payer: Self-pay | Admitting: Cardiovascular Disease

## 2015-12-30 NOTE — Telephone Encounter (Signed)
3 attempts to schedule fu from recall list.  No ans no vm.  Deleting recall.

## 2016-01-25 ENCOUNTER — Telehealth: Payer: Self-pay | Admitting: Family

## 2016-01-25 DIAGNOSIS — F411 Generalized anxiety disorder: Secondary | ICD-10-CM

## 2016-01-25 NOTE — Telephone Encounter (Signed)
Please call pt-  Last seen 10/2015. She needs regular follow up every 3 months for controlled substances.    Please make an appt for patient.    She is also over due for preventative care and needs appt.

## 2016-01-25 NOTE — Telephone Encounter (Signed)
Called patient but was unable to leave VM on phone due to memory being full.

## 2016-01-26 MED ORDER — ALPRAZOLAM 0.5 MG PO TABS: ORAL_TABLET | ORAL | 0 refills | Status: DC

## 2016-01-26 NOTE — Telephone Encounter (Signed)
Pt made an appt for med refill on 11/13.Marland Kitchen Pt is out of xanax and would like at least enough till Monday.. Because of her panic attacks.. Please advise

## 2016-01-26 NOTE — Telephone Encounter (Signed)
Spoke with patient , notified of Rx being sent, thanks

## 2016-01-26 NOTE — Telephone Encounter (Signed)
Patient has appt scheduled on 11/13, she will run out of meds today as it was filled on 10/27/2015 with 30 days worth.  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  Can we refill prior to the appt? We discussed with her that she will need to have 3 month follow ups to continue to get the medications.

## 2016-01-26 NOTE — Telephone Encounter (Signed)
Pt called back returning your call. Thank you!  Call pt '@[phone]'$ [phone]

## 2016-01-26 NOTE — Telephone Encounter (Signed)
Please advise pt at [phone]

## 2016-01-26 NOTE — Telephone Encounter (Signed)
Refilled

## 2016-01-26 NOTE — Telephone Encounter (Signed)
Attempted to reach , left a VM

## 2016-01-31 ENCOUNTER — Ambulatory Visit (INDEPENDENT_AMBULATORY_CARE_PROVIDER_SITE_OTHER): Payer: Managed Care, Other (non HMO) | Admitting: Family

## 2016-01-31 ENCOUNTER — Encounter: Payer: Self-pay | Admitting: Family

## 2016-01-31 DIAGNOSIS — F411 Generalized anxiety disorder: Secondary | ICD-10-CM

## 2016-01-31 NOTE — Patient Instructions (Signed)
Look forward to seeing you at your physical.

## 2016-01-31 NOTE — Assessment & Plan Note (Signed)
Unchanged. Stable. Again we discussed long term risks of BZD especially addiction and dementia.Trial of using Xanax 4 times per week. Patient verbalized understanding. CSC. Declined referral to counseling due to cost. Also declined trial of SSRI. Will continue to follow.

## 2016-01-31 NOTE — Progress Notes (Signed)
Pre visit review using our clinic review tool, if applicable. No additional management support is needed unless otherwise documented below in the visit note. 

## 2016-01-31 NOTE — Progress Notes (Signed)
Subjective:    Patient ID: Maureen [name], female    DOB: 09-14-1945, 70 y.o.   MRN: [id number]  CC: Maureen [name] is a 70 y.o. female who presents today for follow up.   HPI: Patient here for follow-up with anxiety.   Unchanged. Continues to take xanax daily, occasionally skips a day. Recent stressors of loss of officers.   Due for CPE, scheduled for December.       HISTORY:  Past Medical History:  Diagnosis Date  . Arthritis 2005  . 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 This was a histologic grade 2 tumor measuring just under 1 cm in diameter. No invasive cancer was identified. ER 90%, PR 40%.  . Breast cancer, right breast ([place]) 1994   Invasive ductal carcinoma.The patient underwent right mastectomy in 1994 for invasive ductal carcinoma.  . Discoid lupus 2005  . Heart murmur 2005  . Hypercholesterolemia 2012  . Mammographic microcalcification 2011  . Panic attack   . Personal history of colonic polyps    Colon polyps  . Personal history of tobacco use, presenting hazards to health   . PUD (peptic ulcer disease)    Last EGD 1/09  . Rectal polyp 2008   villous adenoma; last colonoscopy 1/09 WNL  . Skin cancer, basal cell 2015   right cheek [facility] surgery at [facility]  . Ulcer [facility])    Past Surgical History:  Procedure Laterality Date  . APPENDECTOMY  2011  . BREAST BIOPSY  12/27/2009   right breast  . [place]  . COLONOSCOPY N/A 08/05/2014   Procedure: COLONOSCOPY;  Surgeon: [name], MD;  Location: [facility] ENDOSCOPY;  EGD, tubular adenoma 1.  . COLONOSCOPY W/ BIOPSIES  2005,2009   Dr. Bary Castilla. Normal exam 2009. Villous adenoma of the rectum in 2005 adenomatous polyp from the ascending colon and rectum in 2002.  . ESOPHAGOGASTRODUODENOSCOPY N/A 08/05/2014   Procedure: ESOPHAGOGASTRODUODENOSCOPY (EGD);  Surgeon: [name], MD;  Location: [facility] ENDOSCOPY;   Service: Endoscopy;  Laterality: N/A;  . MASTECTOMY     bilateral  . SIMPLE MASTECTOMY  1993   right  . SIMPLE MASTECTOMY  2011   left  . TONSILLECTOMY  1997  . UPPER GI ENDOSCOPY  2003, 2005, 2009, 2012, 2014   2012 showed mild reflux changes without evidence of Barrett's epithelial changes 2014 biopsies at 37 cm showed changes suggestive of reflux esophagitis. No metaplasia.  Marland Kitchen VAGINAL HYSTERECTOMY  1977   Family History  Problem Relation Age of Onset  . Cancer Maternal Aunt     breast cancer  . Cancer Maternal Grandmother     breast cancer    Allergies: Codeine; Sulfa antibiotics; Sulfinpyrazone; Erythromycin; and Penicillins Current Outpatient Prescriptions on File Prior to Visit  Medication Sig Dispense Refill  . acetaminophen (TYLENOL) 500 MG tablet Take 500 mg by mouth every 6 (six) hours as needed for headache.    . ALPRAZolam (XANAX) 0.5 MG tablet TAKE ONE TABLET BY MOUTH 2 TIMES DAILY AS NEEDED 60 tablet 0  . budesonide-formoterol (SYMBICORT) 160-4.5 MCG/ACT inhaler Inhale 2 puffs into the lungs 2 (two) times daily. 1 Inhaler 6  . Cholecalciferol (VITAMIN D) 2000 UNITS CAPS Take 1 capsule by mouth every other day.    . clobetasol cream (TEMOVATE) 2.02 % Apply 1 application topically daily.    . Homeopathic Products (ZICAM ALLERGY RELIEF NA) Place 2 sprays into the  nose daily as needed (allergies).     Marland Kitchen omeprazole (PRILOSEC) 20 MG capsule Take 20 mg by mouth daily as needed (acid reflux).     Marland Kitchen RA KRILL OIL 500 MG CAPS Take 500 mg by mouth daily.    . traMADol (ULTRAM) 50 MG tablet Take 1 tablet (50 mg total) by mouth every 8 (eight) hours as needed. 30 tablet 0  . zinc gluconate 50 MG tablet Take 50 mg by mouth every other day.     No current facility-administered medications on file prior to visit.     Social History  Substance Use Topics  . Smoking status: Current Every Day Smoker    Packs/day: 1.00    Years: 40.00    Types: Cigarettes  . Smokeless tobacco:  Never Used     Comment: Pt wants to do hypnotizing  . Alcohol use 0.6 - 1.2 oz/week    1 - 2 Standard drinks or equivalent per week    Review of Systems  Constitutional: Negative for chills and fever.  Respiratory: Negative for cough.   Cardiovascular: Negative for chest pain and palpitations.  Gastrointestinal: Negative for nausea and vomiting.  Psychiatric/Behavioral: The patient is nervous/anxious.       Objective:    BP 124/72   Pulse 90   Temp 97.8 F (36.6 C) (Oral)   Ht '5\' 3"'$  (1.6 m)   Wt 143 lb 6.4 oz (65 kg)   SpO2 97%   BMI 25.40 kg/m  BP Readings from Last 3 Encounters:  01/31/16 124/72  11/09/15 138/80  10/27/15 130/70   Wt Readings from Last 3 Encounters:  01/31/16 143 lb 6.4 oz (65 kg)  10/27/15 145 lb 3.2 oz (65.9 kg)  06/14/15 141 lb (64 kg)    Physical Exam  Constitutional: She appears well-developed and well-nourished.  Eyes: Conjunctivae are normal.  Cardiovascular: Normal rate, regular rhythm, normal heart sounds and normal pulses.   Pulmonary/Chest: Effort normal and breath sounds normal. She has no wheezes. She has no rhonchi. She has no rales.  Neurological: She is alert.  Skin: Skin is warm and dry.  Psychiatric: She has a normal mood and affect. Her speech is normal and behavior is normal. Thought content normal.  Vitals reviewed.      Assessment & Plan:   Problem List Items Addressed This Visit      Other   Generalized anxiety disorder    Unchanged. Stable. Again we discussed long term risks of BZD especially addiction and dementia.Trial of using Xanax 4 times per week. Patient verbalized understanding. CSC. Declined referral to counseling due to cost. Also declined trial of SSRI. Will continue to follow.        Other Visit Diagnoses    Anxiety state           I am having Ms. Thilges maintain her omeprazole, clobetasol cream, Homeopathic Products (ZICAM ALLERGY RELIEF NA), acetaminophen, Vitamin D, zinc gluconate, RA KRILL OIL,  budesonide-formoterol, traMADol, and ALPRAZolam.   No orders of the defined types were placed in this encounter.   Return precautions given.   Risks, benefits, and alternatives of the medications and treatment plan prescribed today were discussed, and patient expressed understanding.   Education regarding symptom management and diagnosis given to patient on AVS.  Continue to follow with Mable Paris, FNP for routine health maintenance.   Maureen [name] and I agreed with plan.   Mable Paris, FNP

## 2016-03-02 ENCOUNTER — Encounter: Payer: PRIVATE HEALTH INSURANCE | Admitting: Nurse Practitioner

## 2016-03-22 ENCOUNTER — Encounter: Payer: Self-pay | Admitting: Family

## 2016-03-22 NOTE — Progress Notes (Deleted)
Subjective:    Patient ID: Maureen [name], female    DOB: 07-15-1945, 71 y.o.   MRN: [id number]  CC: Maureen [name] is a 71 y.o. female who presents today for physical exam.    HPI: GERD  Anxiety  Smoking      Colorectal Cancer Screening: UTD, EGD 2016 normal. One polyp removed. Polyps also removed from colon. 5 year or 10 year??  Breast Cancer Screening: due Cervical Cancer Screening: UTD*** Bone Health screening/DEXA for 65+: No increased fracture risk. Defer screening at this time.*** Lung Cancer Screening: Smoker       Tetanus - UTD        Pneumococcal - Candidate for pneumovax Hepatitis C screening - Candidate for***  Labs: Screening labs today. Exercise: Gets regular exercise.  Alcohol use: *** Smoking/tobacco use: smoker.  Regular dental exams: In need of dental exam.*** Wears seat belt: Yes.  HISTORY:  Past Medical History:  Diagnosis Date  . Arthritis 2005  . 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 This was a histologic grade 2 tumor measuring just under 1 cm in diameter. No invasive cancer was identified. ER 90%, PR 40%.  . Breast cancer, right breast ([place]) 1994   Invasive ductal carcinoma.The patient underwent right mastectomy in 1994 for invasive ductal carcinoma.  . Discoid lupus 2005  . Heart murmur 2005  . Hypercholesterolemia 2012  . Mammographic microcalcification 2011  . Panic attack   . Personal history of colonic polyps    Colon polyps  . Personal history of tobacco use, presenting hazards to health   . PUD (peptic ulcer disease)    Last EGD 1/09  . Rectal polyp 2008   villous adenoma; last colonoscopy 1/09 WNL  . Skin cancer, basal cell 2015   right cheek Mohs surgery at [facility]  . Ulcer [facility])     Past Surgical History:  Procedure Laterality Date  . APPENDECTOMY  2011  . BREAST BIOPSY  12/27/2009   right breast  . [place]  .  COLONOSCOPY N/A 08/05/2014   Procedure: COLONOSCOPY;  Surgeon: [name], MD;  Location[facility] ENDOSCOPY;  EGD, tubular adenoma 1.  . COLONOSCOPY W/ BIOPSIES  2005,2009   Dr. Bary Castilla. Normal exam 2009. Villous adenoma of the rectum in 2005 adenomatous polyp from the ascending colon and rectum in 2002.  . ESOPHAGOGASTRODUODENOSCOPY N/A 08/05/2014   Procedure: ESOPHAGOGASTRODUODENOSCOPY (EGD);  Surgeon: [name], MD;  Location: [facility] ENDOSCOPY;  Service: Endoscopy;  Laterality: N/A;  . MASTECTOMY     bilateral  . SIMPLE MASTECTOMY  1993   right  . SIMPLE MASTECTOMY  2011   left  . TONSILLECTOMY  1997  . UPPER GI ENDOSCOPY  2003, 2005, 2009, 2012, 2014   2012 showed mild reflux changes without evidence of Barrett's epithelial changes 2014 biopsies at 37 cm showed changes suggestive of reflux esophagitis. No metaplasia.  Marland Kitchen VAGINAL HYSTERECTOMY  1977   Family History  Problem Relation Age of Onset  . Cancer Maternal Aunt     breast cancer  . Cancer Maternal Grandmother     breast cancer      ALLERGIES: Codeine; Sulfa antibiotics; Sulfinpyrazone; Erythromycin; and Penicillins  Current Outpatient Prescriptions on File Prior to Visit  Medication Sig Dispense Refill  . acetaminophen (TYLENOL) 500 MG tablet Take 500 mg by mouth every 6 (six) hours as needed for headache.    . ALPRAZolam Duanne Moron)  0.5 MG tablet TAKE ONE TABLET BY MOUTH 2 TIMES DAILY AS NEEDED 60 tablet 0  . budesonide-formoterol (SYMBICORT) 160-4.5 MCG/ACT inhaler Inhale 2 puffs into the lungs 2 (two) times daily. 1 Inhaler 6  . Cholecalciferol (VITAMIN D) 2000 UNITS CAPS Take 1 capsule by mouth every other day.    . clobetasol cream (TEMOVATE) 1.21 % Apply 1 application topically daily.    . Homeopathic Products (ZICAM ALLERGY RELIEF NA) Place 2 sprays into the nose daily as needed (allergies).     Marland Kitchen omeprazole (PRILOSEC) 20 MG capsule Take 20 mg by mouth daily as needed (acid reflux).     Marland Kitchen RA KRILL OIL 500 MG  CAPS Take 500 mg by mouth daily.    . traMADol (ULTRAM) 50 MG tablet Take 1 tablet (50 mg total) by mouth every 8 (eight) hours as needed. 30 tablet 0  . zinc gluconate 50 MG tablet Take 50 mg by mouth every other day.     No current facility-administered medications on file prior to visit.     Social History  Substance Use Topics  . Smoking status: Current Every Day Smoker    Packs/day: 1.00    Years: 40.00    Types: Cigarettes  . Smokeless tobacco: Never Used     Comment: Pt wants to do hypnotizing  . Alcohol use 0.6 - 1.2 oz/week    1 - 2 Standard drinks or equivalent per week    Review of Systems    Objective:    There were no vitals taken for this visit.  BP Readings from Last 3 Encounters:  01/31/16 124/72  11/09/15 138/80  10/27/15 130/70   Wt Readings from Last 3 Encounters:  01/31/16 143 lb 6.4 oz (65 kg)  10/27/15 145 lb 3.2 oz (65.9 kg)  06/14/15 141 lb (64 kg)    Physical Exam     Assessment & Plan:   Problem List Items Addressed This Visit    None       I am having [name] maintain her omeprazole, clobetasol cream, Homeopathic Products (ZICAM ALLERGY RELIEF NA), acetaminophen, Vitamin D, zinc gluconate, RA KRILL OIL, budesonide-formoterol, traMADol, and ALPRAZolam.   No orders of the defined types were placed in this encounter.   Return precautions given.   Risks, benefits, and alternatives of the medications and treatment plan prescribed today were discussed, and patient expressed understanding.   Education regarding symptom management and diagnosis given to patient on AVS.   Continue to follow with Mable Paris, FNP for routine health maintenance.   Maureen [name] and I agreed with plan.   Mable Paris, FNP

## 2016-03-23 ENCOUNTER — Encounter: Payer: Self-pay | Admitting: Family

## 2016-03-23 ENCOUNTER — Other Ambulatory Visit: Payer: Self-pay

## 2016-03-23 DIAGNOSIS — F411 Generalized anxiety disorder: Secondary | ICD-10-CM

## 2016-03-23 MED ORDER — ALPRAZOLAM 0.5 MG PO TABS: ORAL_TABLET | ORAL | 0 refills | Status: DC

## 2016-03-23 NOTE — Telephone Encounter (Signed)
Medication has been refilled.

## 2016-03-28 ENCOUNTER — Telehealth: Payer: Self-pay | Admitting: Family

## 2016-03-28 ENCOUNTER — Encounter (INDEPENDENT_AMBULATORY_CARE_PROVIDER_SITE_OTHER): Payer: Self-pay | Admitting: Family

## 2016-03-28 DIAGNOSIS — Z0289 Encounter for other administrative examinations: Secondary | ICD-10-CM

## 2016-03-28 NOTE — Telephone Encounter (Signed)
FYI

## 2016-03-28 NOTE — Telephone Encounter (Signed)
FYI - Pt called and cancelled appt. Got caught up at work. rescheduled for next week.

## 2016-04-04 ENCOUNTER — Ambulatory Visit: Payer: Self-pay | Admitting: Family

## 2016-05-02 ENCOUNTER — Encounter: Payer: Self-pay | Admitting: Physician Assistant

## 2016-05-02 ENCOUNTER — Ambulatory Visit: Payer: Self-pay | Admitting: Physician Assistant

## 2016-05-02 VITALS — BP 161/74 | HR 80 | Temp 97.1°F

## 2016-05-02 DIAGNOSIS — J069 Acute upper respiratory infection, unspecified: Secondary | ICD-10-CM

## 2016-05-02 MED ORDER — LEVOFLOXACIN 500 MG PO TABS: 500.0000 mg | ORAL_TABLET | Freq: Every day | ORAL | 0 refills | Status: DC

## 2016-05-02 NOTE — Progress Notes (Signed)
S: C/o cough and congestion for 2 days, no fever, chills, cp/sob, v/d; mucus was green this am but clear throughout the day, cough is sporadic, was around 2 people that had pneumonia  Using otc meds:   O: PE: vitals wnl, nad, perrl eomi, normocephalic, tms dull, nasal mucosa red and swollen, throat injected, neck supple no lymph, lungs c t a, cv rrr, neuro intact  A:  Acute  uri   P: drink fluids, continue regular meds , use otc meds of choice, return if not improving in 5 days, return earlier if worsening , levaquin '500mg'$  qd x 7d

## 2016-05-23 ENCOUNTER — Ambulatory Visit (INDEPENDENT_AMBULATORY_CARE_PROVIDER_SITE_OTHER): Payer: Managed Care, Other (non HMO) | Admitting: Family Medicine

## 2016-05-23 ENCOUNTER — Encounter: Payer: Self-pay | Admitting: Family Medicine

## 2016-05-23 VITALS — BP 121/74 | HR 90 | Temp 97.7°F | Resp 17 | Ht 64.25 in | Wt 144.0 lb

## 2016-05-23 DIAGNOSIS — L930 Discoid lupus erythematosus: Secondary | ICD-10-CM

## 2016-05-23 DIAGNOSIS — Z0000 Encounter for general adult medical examination without abnormal findings: Secondary | ICD-10-CM | POA: Diagnosis not present

## 2016-05-23 DIAGNOSIS — Z716 Tobacco abuse counseling: Secondary | ICD-10-CM

## 2016-05-23 DIAGNOSIS — E782 Mixed hyperlipidemia: Secondary | ICD-10-CM | POA: Diagnosis not present

## 2016-05-23 DIAGNOSIS — Z1159 Encounter for screening for other viral diseases: Secondary | ICD-10-CM

## 2016-05-23 DIAGNOSIS — Z86010 Personal history of colonic polyps: Secondary | ICD-10-CM

## 2016-05-23 DIAGNOSIS — Z853 Personal history of malignant neoplasm of breast: Secondary | ICD-10-CM

## 2016-05-23 DIAGNOSIS — Z23 Encounter for immunization: Secondary | ICD-10-CM

## 2016-05-23 DIAGNOSIS — F411 Generalized anxiety disorder: Secondary | ICD-10-CM

## 2016-05-23 DIAGNOSIS — K219 Gastro-esophageal reflux disease without esophagitis: Secondary | ICD-10-CM | POA: Diagnosis not present

## 2016-05-23 MED ORDER — OMEPRAZOLE 20 MG PO CPDR: 20.0000 mg | DELAYED_RELEASE_CAPSULE | Freq: Every day | ORAL | 4 refills | Status: DC | PRN

## 2016-05-23 NOTE — Assessment & Plan Note (Signed)
Follows with Dr. Evorn Gong. Stable. Continue to monitor.

## 2016-05-23 NOTE — Assessment & Plan Note (Signed)
Stable. Continue omeprazole. Will give refill today. Continue to follow with Dr. Bary Castilla. Call with any concerns.

## 2016-05-23 NOTE — Assessment & Plan Note (Signed)
Discussed with patient that I do not prescribe any controlled substances on the first visit. She declines any SSRI or counseling at this time. Discussed that we will need to see her every 3 months to be able to write this medicine. She is aware. PMP reviewed and appropriate. Appears to be using about 60 pills every 2 months. Will get her back shortly and will get controlled substance agreement signed.

## 2016-05-23 NOTE — Assessment & Plan Note (Signed)
Not interested in quitting. She knows that we are here if she changes her mind and needs help.

## 2016-05-23 NOTE — Progress Notes (Signed)
BP 121/74 (BP Location: Left Arm, Patient Position: Sitting, Cuff Size: Normal)   Pulse 90   Temp 97.7 F (36.5 C) (Oral)   Resp 17   Ht 5' 4.25" (1.632 m)   Wt 144 lb (65.3 kg)   SpO2 95%   BMI 24.53 kg/m    Subjective:    Patient ID: Maureen [name], female    DOB: 10-27-1945, 71 y.o.   MRN: [id number]  HPI: Maureen [name] is a 71 y.o. female who presents today to establish care. Had been following with PCP regularly up until November. Has not had any labs done in years. Was due for CPE in December 2017.  Chief Complaint  Patient presents with  . Establish Care   GERD- takes the medicine as needed. Takes 2-3x a week GERD control status: controlled  Satisfied with current treatment? yes Heartburn frequency: 2-3x a week Medication side effects: no  Medication compliance: fluctuating Antacid use frequency: rarely  Duration: chronic Nature: burning Location: substernal Heartburn duration: couple of hours  Alleviatiating factors: medication Aggravating factors: food Dysphagia: no Odynophagia:  no Hematemesis: no Blood in stool: no EGD: yes  ANXIETY/STRESS- has panic attacks and has had them for years. Has panic attacks with night coming on. Has them other times during the day. Saw [facility] in the past for psychiatry. Took other medicine for about 2 years. Stopped preventative medication many years ago. Now only taking them when she needs them. Takes xanax nightly and helps her sleep about 3-4 hours. She notes that when she tried to come off of it, she would only sleep about an hour. Usually gets a bottle of 90 xanax and it will last her about 3 months.  Duration:stable Anxious mood: yes  Excessive worrying: yes Irritability: no  Sweating: yes Nausea: yes Palpitations:yes Hyperventilation: no Panic attacks: yes Agoraphobia: no  Obscessions/compulsions: no Depressed mood: no Depression screen [facility] 2/9 05/23/2016 03/02/2015 02/18/2014  Decreased Interest 0 0 0  Down,  Depressed, Hopeless 0 0 0  PHQ - 2 Score 0 0 0   Anhedonia: no Weight changes: no Insomnia: no   Hypersomnia: no Fatigue/loss of energy: no Feelings of worthlessness: no Feelings of guilt: no Impaired concentration/indecisiveness: no Suicidal ideations: no  Crying spells: no Recent Stressors/Life Changes: no  HYPERLIPIDEMIA Hyperlipidemia status: uncontrolled Satisfied with current treatment?  yes Side effects:  Not on anything Past cholesterol meds: none Supplements: none Aspirin:  no Chest pain:  no Coronary artery disease:  no Family history CAD:  yes   Active Ambulatory Problems    Diagnosis Date Noted  . History of breast cancer 03/29/2012  . Lupus 04/16/2013  . Generalized anxiety disorder 04/16/2013  . Tobacco abuse counseling 07/16/2013  . Hyperlipidemia 05/11/2014  . History of colonic polyps 06/12/2014  . Esophageal reflux 06/12/2014   Resolved Ambulatory Problems    Diagnosis Date Noted  . History of colon polyps 03/29/2012  . Medicare annual wellness visit, subsequent 02/18/2014  . Chest wall pain 12/14/2014  . Abnormal EKG 01/28/2015  . Routine general medical examination at a health care facility 03/02/2015   Past Medical History:  Diagnosis Date  . Arthritis 2005  . Breast cancer, left breast ([place]) 2011  . Breast cancer, right breast ([place]) 1994  . Discoid lupus 2005  . Heart murmur 2005  . Hypercholesterolemia 2012  . Mammographic microcalcification 2011  . Panic attack   . Personal history of colonic polyps   . Personal history of tobacco use, presenting hazards  to health   . PUD (peptic ulcer disease)   . Rectal polyp 2008  . Skin cancer, basal cell 2015  . Ulcer [facility])    Past Surgical History:  Procedure Laterality Date  . APPENDECTOMY  2011  . BREAST BIOPSY  12/27/2009   right breast  . CARDIAC CATHETERIZATION  1996   Negative Cath  . COLONOSCOPY N/A 08/05/2014   Procedure: COLONOSCOPY;  Surgeon: [name], MD;  Location:  [facility] ENDOSCOPY;  EGD, tubular adenoma 1.  . COLONOSCOPY W/ BIOPSIES  2005,2009   Dr. Bary Castilla. Normal exam 2009. Villous adenoma of the rectum in 2005 adenomatous polyp from the ascending colon and rectum in 2002.  . ESOPHAGOGASTRODUODENOSCOPY N/A 08/05/2014   Procedure: ESOPHAGOGASTRODUODENOSCOPY (EGD);  Surgeon: [name], MD;  Location: [facility] ENDOSCOPY;  Service: Endoscopy;  Laterality: N/A;  . MASTECTOMY     bilateral  . SIMPLE MASTECTOMY  1993   right  . SIMPLE MASTECTOMY  2011   left  . TONSILLECTOMY  1997  . UPPER GI ENDOSCOPY  2003, 2005, 2009, 2012, 2014   2012 showed mild reflux changes without evidence of Barrett's epithelial changes 2014 biopsies at 37 cm showed changes suggestive of reflux esophagitis. No metaplasia.  Marland Kitchen [place]   Outpatient Encounter Prescriptions as of 05/23/2016  Medication Sig  . acetaminophen (TYLENOL) 500 MG tablet Take 500 mg by mouth every 6 (six) hours as needed for headache.  . ALPRAZolam (XANAX) 0.5 MG tablet TAKE ONE TABLET BY MOUTH 2 TIMES DAILY AS NEEDED  . budesonide-formoterol (SYMBICORT) 160-4.5 MCG/ACT inhaler Inhale 2 puffs into the lungs 2 (two) times daily.  . Cholecalciferol (VITAMIN D) 2000 UNITS CAPS Take 1 capsule by mouth every other day.  . clobetasol cream (TEMOVATE) 2.29 % Apply 1 application topically daily.  Marland Kitchen omeprazole (PRILOSEC) 20 MG capsule Take 1 capsule (20 mg total) by mouth daily as needed (acid reflux).  Marland Kitchen RA KRILL OIL 500 MG CAPS Take 500 mg by mouth daily.  Marland Kitchen zinc gluconate 50 MG tablet Take 50 mg by mouth every other day.  . [DISCONTINUED] omeprazole (PRILOSEC) 20 MG capsule Take 20 mg by mouth daily as needed (acid reflux).   . Homeopathic Products (ZICAM ALLERGY RELIEF NA) Place 2 sprays into the nose daily as needed (allergies).   . [DISCONTINUED] levofloxacin (LEVAQUIN) 500 MG tablet Take 1 tablet (500 mg total) by mouth daily.  . [DISCONTINUED] traMADol (ULTRAM) 50 MG tablet Take 1 tablet  (50 mg total) by mouth every 8 (eight) hours as needed.   No facility-administered encounter medications on file as of 05/23/2016.    Allergies  Allergen Reactions  . Codeine Itching  . Sulfa Antibiotics Hives  . Sulfinpyrazone   . Erythromycin Rash  . Penicillins Rash   Social History   Social History  . Marital status: Widowed    Spouse name: N/A  . Number of children: N/A  . Years of education: N/A   Occupational History  . [employer]   Social History Main Topics  . Smoking status: Current Every Day Smoker    Packs/day: 1.00    Years: 40.00    Types: Cigarettes  . Smokeless tobacco: Never Used     Comment: Pt wants to do hypnotizing  . Alcohol use 0.6 - 1.2 oz/week    1 - 2 Standard drinks or equivalent per week  . Drug use: No  . Sexual activity: Not  Currently   Other Topics Concern  . Not on file   Social History Narrative   Druscilla grew up in [place]. She has two adult daughters. She lives in her home. Her youngest daughter and grandchildren live with her. She has two dogs. 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 She loves watching her grandson play basketball. She also loves gardening and being outdoors and working in the yard.    Family History  Problem Relation Age of Onset  . Cancer Father     esophagus  . Cancer Maternal Aunt     breast cancer  . Cancer Maternal Grandmother     breast cancer    Review of Systems  Constitutional: Negative.   Respiratory: Negative.   Cardiovascular: Negative.   Gastrointestinal: Negative.   Psychiatric/Behavioral: Positive for agitation and sleep disturbance. Negative for behavioral problems, confusion, decreased concentration, dysphoric mood, hallucinations, self-injury and suicidal ideas. The patient is nervous/anxious. The patient is not hyperactive.     Per HPI unless specifically indicated above     Objective:      BP 121/74 (BP Location: Left Arm, Patient Position: Sitting, Cuff Size: Normal)   Pulse 90   Temp 97.7 F (36.5 C) (Oral)   Resp 17   Ht 5' 4.25" (1.632 m)   Wt 144 lb (65.3 kg)   SpO2 95%   BMI 24.53 kg/m   Wt Readings from Last 3 Encounters:  05/23/16 144 lb (65.3 kg)  01/31/16 143 lb 6.4 oz (65 kg)  10/27/15 145 lb 3.2 oz (65.9 kg)    Physical Exam  Constitutional: She is oriented to person, place, and time. She appears well-developed and well-nourished. No distress.  HENT:  Head: Normocephalic and atraumatic.  Right Ear: Hearing normal.  Left Ear: Hearing normal.  Nose: Nose normal.  Eyes: Conjunctivae and lids are normal. Right eye exhibits no discharge. Left eye exhibits no discharge. No scleral icterus.  Cardiovascular: Normal rate, regular rhythm, normal heart sounds and intact distal pulses.  Exam reveals no gallop and no friction rub.   No murmur heard. Pulmonary/Chest: Effort normal and breath sounds normal. No respiratory distress. She has no wheezes. She has no rales. She exhibits no tenderness.  Abdominal: Soft. Bowel sounds are normal.  Musculoskeletal: She exhibits no deformity.  Neurological: She is alert and oriented to person, place, and time.  Skin: Skin is warm, dry and intact. No rash noted. She is not diaphoretic. No erythema. No pallor.  Psychiatric: Her speech is normal and behavior is normal. Judgment and thought content normal. Her mood appears anxious. Cognition and memory are normal.  Nursing note and vitals reviewed.   Results for orders placed or performed during the hospital encounter of 12/10/14  CBC with Differential  Result Value Ref Range   WBC 6.3 3.6 - 11.0 K/uL   RBC 4.48 3.80 - 5.20 MIL/uL   Hemoglobin 14.0 12.0 - 16.0 g/dL   HCT 41.2 35.0 - 47.0 %   MCV 92.0 80.0 - 100.0 fL   MCH 31.3 26.0 - 34.0 pg   MCHC 34.1 32.0 - 36.0 g/dL   RDW 13.9 11.5 - 14.5 %   Platelets 175 150 - 440 K/uL   Neutrophils Relative % 46 %   Neutro Abs  2.9 1.4 - 6.5 K/uL   Lymphocytes Relative 46 %   Lymphs Abs 2.9 1.0 - 3.6 K/uL   Monocytes Relative 6 %   Monocytes Absolute 0.4 0.2 - 0.9 K/uL  Eosinophils Relative 1 %   Eosinophils Absolute 0.1 0 - 0.7 K/uL   Basophils Relative 1 %   Basophils Absolute 0.0 0 - 0.1 K/uL  Basic metabolic panel  Result Value Ref Range   Sodium 138 135 - 145 mmol/L   Potassium 3.6 3.5 - 5.1 mmol/L   Chloride 105 101 - 111 mmol/L   CO2 25 22 - 32 mmol/L   Glucose, Bld 95 65 - 99 mg/dL   BUN 11 6 - 20 mg/dL   Creatinine, Ser 1.07 (H) 0.44 - 1.00 mg/dL   Calcium 9.7 8.9 - 10.3 mg/dL   GFR calc non Af Amer 52 (L) >60 mL/min   GFR calc Af Amer >60 >60 mL/min   Anion gap 8 5 - 15  Troponin I  Result Value Ref Range   Troponin I <0.03 <0.031 ng/mL      Assessment & Plan:   Problem List Items Addressed This Visit      Digestive   Esophageal reflux - Primary    Stable. Continue omeprazole. Will give refill today. Continue to follow with Dr. Bary Castilla. Call with any concerns.       Relevant Medications   omeprazole (PRILOSEC) 20 MG capsule   Other Relevant Orders   CBC with Differential/Platelet   Comprehensive metabolic panel   TSH     Other   History of breast cancer    Continue to follow with Dr. Bary Castilla. Call with any concerns.       Lupus    Follows with Dr. Evorn Gong. Stable. Continue to monitor.       Relevant Orders   Comprehensive metabolic panel   VITAMIN D 25 Hydroxy (Vit-D Deficiency, Fractures)   Generalized anxiety disorder    Discussed with patient that I do not prescribe any controlled substances on the first visit. She declines any SSRI or counseling at this time. Discussed that we will need to see her every 3 months to be able to write this medicine. She is aware. PMP reviewed and appropriate. Appears to be using about 60 pills every 2 months. Will get her back shortly and will get controlled substance agreement signed.       Relevant Orders   Comprehensive metabolic  panel   TSH   VITAMIN D 25 Hydroxy (Vit-D Deficiency, Fractures)   Tobacco abuse counseling    Not interested in quitting. She knows that we are here if she changes her mind and needs help.      Hyperlipidemia    Will recheck levels at physical later this week. Await results. Orders in today.       Relevant Orders   Comprehensive metabolic panel   Lipid Panel w/o Chol/HDL Ratio   History of colonic polyps    Continue to follow with Dr. Bary Castilla. Up to date on colonoscopy. Call with any concerns.        Other Visit Diagnoses    Immunization due       Pneumovax given today.   Relevant Orders   Pneumococcal polysaccharide vaccine 23-valent greater than or equal to 2yo subcutaneous/IM (Completed)   Comprehensive metabolic panel   Routine general medical examination at a health care facility       To be done next visit. Rx for her labs given today.   Relevant Orders   CBC with Differential/Platelet   Comprehensive metabolic panel   Lipid Panel w/o Chol/HDL Ratio   TSH   UA/M w/rflx Culture, Routine   VITAMIN D 25 Hydroxy (  Vit-D Deficiency, Fractures)   Hepatitis C Antibody   Encounter for hepatitis C screening test for low risk patient       Order put in today. Await results.    Relevant Orders   Hepatitis C Antibody       Follow up plan: Return Thursday 9:45 , for OK for CPE.

## 2016-05-23 NOTE — Assessment & Plan Note (Signed)
Continue to follow with Dr. Bary Castilla. Call with any concerns.

## 2016-05-23 NOTE — Assessment & Plan Note (Signed)
Continue to follow with Dr. Bary Castilla. Up to date on colonoscopy. Call with any concerns.

## 2016-05-23 NOTE — Assessment & Plan Note (Signed)
Will recheck levels at physical later this week. Await results. Orders in today.

## 2016-05-23 NOTE — Patient Instructions (Addendum)

## 2016-05-24 ENCOUNTER — Other Ambulatory Visit: Payer: Self-pay

## 2016-05-25 ENCOUNTER — Ambulatory Visit (INDEPENDENT_AMBULATORY_CARE_PROVIDER_SITE_OTHER): Payer: Managed Care, Other (non HMO) | Admitting: Family Medicine

## 2016-05-25 ENCOUNTER — Encounter: Payer: Self-pay | Admitting: Family Medicine

## 2016-05-25 VITALS — BP 123/84 | HR 83 | Temp 98.3°F | Resp 17 | Ht 64.25 in | Wt 144.0 lb

## 2016-05-25 DIAGNOSIS — L930 Discoid lupus erythematosus: Secondary | ICD-10-CM | POA: Diagnosis not present

## 2016-05-25 DIAGNOSIS — K219 Gastro-esophageal reflux disease without esophagitis: Secondary | ICD-10-CM

## 2016-05-25 DIAGNOSIS — Z7189 Other specified counseling: Secondary | ICD-10-CM

## 2016-05-25 DIAGNOSIS — F411 Generalized anxiety disorder: Secondary | ICD-10-CM

## 2016-05-25 DIAGNOSIS — Z720 Tobacco use: Secondary | ICD-10-CM

## 2016-05-25 DIAGNOSIS — E782 Mixed hyperlipidemia: Secondary | ICD-10-CM | POA: Diagnosis not present

## 2016-05-25 DIAGNOSIS — Z0000 Encounter for general adult medical examination without abnormal findings: Secondary | ICD-10-CM | POA: Diagnosis not present

## 2016-05-25 DIAGNOSIS — Z79899 Other long term (current) drug therapy: Secondary | ICD-10-CM | POA: Diagnosis not present

## 2016-05-25 DIAGNOSIS — Z1159 Encounter for screening for other viral diseases: Secondary | ICD-10-CM | POA: Diagnosis not present

## 2016-05-25 MED ORDER — ALPRAZOLAM 0.5 MG PO TABS: 0.2500 mg | ORAL_TABLET | Freq: Two times a day (BID) | ORAL | 0 refills | Status: DC | PRN

## 2016-05-25 NOTE — Assessment & Plan Note (Addendum)
She is a candidate for low dose CT scanning. She is unsure if she wants to have screening. Information given. Will consider and will call us. She will contact us if she needs help quitting smoking. Information provided.

## 2016-05-25 NOTE — Assessment & Plan Note (Addendum)
Has not considered it. Packet given today. Will bring it home and consider and will bring [place] in a copy

## 2016-05-25 NOTE — Assessment & Plan Note (Signed)
Checking labs. Await results.

## 2016-05-25 NOTE — Progress Notes (Signed)
BP 123/84 (BP Location: Right Arm, Patient Position: Sitting, Cuff Size: Normal)   Pulse 83   Temp 98.3 F (36.8 C) (Oral)   Resp 17   Ht 5' 4.25" (1.632 m)   Wt 144 lb (65.3 kg)   SpO2 94%   BMI 24.53 kg/m    Subjective:    Patient ID: Maureen [name], female    DOB: 1945/12/28, 71 y.o.   MRN: [id number]  HPI: Maureen [name] is a 71 y.o. female presenting on 05/25/2016 for comprehensive medical examination. Current medical complaints include:  ANXIETY/STRESS Duration: chronic  Status: Stable Anxious mood: yes  Excessive worrying: yes Irritability: no  Sweating: yes Nausea: yes Palpitations:yes Hyperventilation: no Panic attacks: yes Agoraphobia: no  Obscessions/compulsions: no Depressed mood: no  Depression screen [facility] 2/9 05/25/2016 05/23/2016 03/02/2015 02/18/2014  Decreased Interest 0 0 0 0  Down, Depressed, Hopeless 0 0 0 0  PHQ - 2 Score 0 0 0 0   Anhedonia: no Weight changes: no Insomnia: no   Hypersomnia: no Fatigue/loss of energy: no Feelings of worthlessness: no Feelings of guilt: no Impaired concentration/indecisiveness: no Suicidal ideations: no  Crying spells: no Recent Stressors/Life Changes: no  She currently lives with: 3 grandchildren and daughter Menopausal Symptoms: no  Functional Status Survey: Is the patient deaf or have difficulty hearing?: No Does the patient have difficulty seeing, even when wearing glasses/contacts?: No Does the patient have difficulty concentrating, remembering, or making decisions?: Yes Does the patient have difficulty walking or climbing stairs?: No Does the patient have difficulty dressing or bathing?: No Does the patient have difficulty doing errands alone such as visiting a doctor's office or shopping?: No  Fall Risk  05/25/2016 05/23/2016 03/02/2015 02/18/2014  Falls in the past year? No No No No   Advanced Directives Does patient have a HCPOA?    no Does patient have a living will or MOST form?  no  Past  Medical History:  Past Medical History:  Diagnosis Date  . Arthritis 2005  . 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 This was a histologic grade 2 tumor measuring just under 1 cm in diameter. No invasive cancer was identified. ER 90%, PR 40%.  . Breast cancer, right breast ([place]) 1994   Invasive ductal carcinoma.The patient underwent right mastectomy in 1994 for invasive ductal carcinoma.  . Discoid lupus 2005  . Heart murmur 2005  . Hypercholesterolemia 2012  . Mammographic microcalcification 2011  . Panic attack   . Personal history of colonic polyps    Colon polyps  . Personal history of tobacco use, presenting hazards to health   . PUD (peptic ulcer disease)    Last EGD 1/09  . Rectal polyp 2008   villous adenoma; last colonoscopy 1/09 WNL  . Skin cancer, basal cell 2015   right cheek Mohs surgery at [facility]  . Ulcer [facility])     Surgical History:  Past Surgical History:  Procedure Laterality Date  . APPENDECTOMY  2011  . BREAST BIOPSY  12/27/2009   right breast  . CARDIAC CATHETERIZATION  1996   Negative Cath  . COLONOSCOPY N/A 08/05/2014   Procedure: COLONOSCOPY;  Surgeon: [name], MD;  Location: [facility] ENDOSCOPY;  EGD, tubular adenoma 1.  . COLONOSCOPY W/ BIOPSIES  2005,2009   Dr. Bary Castilla. Normal exam 2009. Villous adenoma of the rectum in 2005 adenomatous polyp from the ascending colon and rectum in 2002.  Marland Kitchen  ESOPHAGOGASTRODUODENOSCOPY N/A 08/05/2014   Procedure: ESOPHAGOGASTRODUODENOSCOPY (EGD);  Surgeon: [name], MD;  Location: [facility] ENDOSCOPY;  Service: Endoscopy;  Laterality: N/A;  . MASTECTOMY     bilateral  . SIMPLE MASTECTOMY  1993   right  . SIMPLE MASTECTOMY  2011   left  . TONSILLECTOMY  1997  . UPPER GI ENDOSCOPY  2003, 2005, 2009, 2012, 2014   2012 showed mild reflux changes without evidence of Barrett's epithelial changes 2014 biopsies at 37 cm showed changes  suggestive of reflux esophagitis. No metaplasia.  Marland Kitchen VAGINAL HYSTERECTOMY  1977    Medications:  Current Outpatient Prescriptions on File Prior to Visit  Medication Sig  . acetaminophen (TYLENOL) 500 MG tablet Take 500 mg by mouth every 6 (six) hours as needed for headache.  . budesonide-formoterol (SYMBICORT) 160-4.5 MCG/ACT inhaler Inhale 2 puffs into the lungs 2 (two) times daily.  . Cholecalciferol (VITAMIN D) 2000 UNITS CAPS Take 1 capsule by mouth every other day.  . clobetasol cream (TEMOVATE) 0.94 % Apply 1 application topically daily.  . Homeopathic Products (ZICAM ALLERGY RELIEF NA) Place 2 sprays into the nose daily as needed (allergies).   Marland Kitchen omeprazole (PRILOSEC) 20 MG capsule Take 1 capsule (20 mg total) by mouth daily as needed (acid reflux).  Marland Kitchen RA KRILL OIL 500 MG CAPS Take 500 mg by mouth daily.  Marland Kitchen zinc gluconate 50 MG tablet Take 50 mg by mouth every other day.   No current facility-administered medications on file prior to visit.     Allergies:  Allergies  Allergen Reactions  . Codeine Itching  . Sulfa Antibiotics Hives  . Sulfinpyrazone   . Erythromycin Rash  . Penicillins Rash    Social History:  Social History   Social History  . Marital status: Widowed    Spouse name: N/A  . Number of children: N/A  . Years of education: N/A   Occupational History  . [employer]   Social History Main Topics  . Smoking status: Current Every Day Smoker    Packs/day: 1.00    Years: 40.00    Types: Cigarettes  . Smokeless tobacco: Never Used     Comment: Pt wants to do hypnotizing  . Alcohol use 0.6 - 1.2 oz/week    1 - 2 Standard drinks or equivalent per week  . Drug use: No  . Sexual activity: Not Currently   Other Topics Concern  . Not on file   Social History Narrative   Dimples grew up in [place]. She has two adult daughters. She lives in her home. Her youngest daughter and  grandchildren live with her. She has two dogs. 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 She loves watching her grandson play basketball. She also loves gardening and being outdoors and working in the yard.    History  Smoking Status  . Current Every Day Smoker  . Packs/day: 1.00  . Years: 40.00  . Types: Cigarettes  Smokeless Tobacco  . Never Used    Comment: Pt wants to do hypnotizing   History  Alcohol Use  . 0.6 - 1.2 oz/week  . 1 - 2 Standard drinks or equivalent per week    Family History:  Family History  Problem Relation Age of Onset  . Cancer Father     esophagus  . Cancer Maternal Aunt     breast cancer  . Cancer Maternal Grandmother  breast cancer    Past medical history, surgical history, medications, allergies, family history and social history reviewed with patient today and changes made to appropriate areas of the chart.   Review of Systems  Constitutional: Negative.   HENT: Positive for congestion. Negative for ear discharge, ear pain, hearing loss, nosebleeds, sinus pain, sore throat and tinnitus.   Eyes: Negative.   Respiratory: Negative.  Negative for stridor.   Cardiovascular: Negative.   Gastrointestinal: Negative.   Genitourinary: Negative.   Musculoskeletal: Negative.   Skin: Negative.   Neurological: Negative.   Endo/Heme/Allergies: Positive for environmental allergies. Negative for polydipsia. Does not bruise/bleed easily.  Psychiatric/Behavioral: Negative for depression, hallucinations, memory loss, substance abuse and suicidal ideas. The patient is nervous/anxious. The patient does not have insomnia.     All other ROS negative except what is listed above and in the HPI.      Objective:    BP 123/84 (BP Location: Right Arm, Patient Position: Sitting, Cuff Size: Normal)   Pulse 83   Temp 98.3 F (36.8 C) (Oral)   Resp 17   Ht 5' 4.25" (1.632 m)   Wt 144 lb (65.3 kg)   SpO2 94%   BMI 24.53 kg/m   Wt  Readings from Last 3 Encounters:  05/25/16 144 lb (65.3 kg)  05/23/16 144 lb (65.3 kg)  01/31/16 143 lb 6.4 oz (65 kg)     Hearing Screening   '125Hz'$  '250Hz'$  '500Hz'$  '1000Hz'$  '2000Hz'$  '3000Hz'$  '4000Hz'$  '6000Hz'$  '8000Hz'$   Right ear:   '25 25 25  '$ 40    Left ear:   '25 25 25  '$ 40      Visual Acuity Screening   Right eye Left eye Both eyes  Without correction: '20/50 20/40 20/40 '$  With correction:      Physical Exam  Constitutional: She is oriented to person, place, and time. She appears well-developed and well-nourished. No distress.  HENT:  Head: Normocephalic and atraumatic.  Right Ear: Hearing, tympanic membrane, external ear and ear canal normal.  Left Ear: Hearing, tympanic membrane, external ear and ear canal normal.  Nose: Nose normal.  Mouth/Throat: Uvula is midline, oropharynx is clear and moist and mucous membranes are normal. No oropharyngeal exudate.  Eyes: Conjunctivae, EOM and lids are normal. Pupils are equal, round, and reactive to light. Right eye exhibits no discharge. Left eye exhibits no discharge. No scleral icterus.  Neck: Normal range of motion. Neck supple. No JVD present. No tracheal deviation present. No thyromegaly present.  Cardiovascular: Normal rate, regular rhythm, normal heart sounds and intact distal pulses.  Exam reveals no gallop and no friction rub.   No murmur heard. Pulmonary/Chest: Effort normal and breath sounds normal. No stridor. No respiratory distress. She has no wheezes. She has no rales. She exhibits no tenderness.  Abdominal: Soft. Bowel sounds are normal. She exhibits no distension and no mass. There is no tenderness. There is no rebound and no guarding.  Genitourinary:  Genitourinary Comments: Breast exam deferred- done at General Surgery Pelvic exam deferred with shared decision making.  Musculoskeletal: Normal range of motion. She exhibits no edema, tenderness or deformity.  Lymphadenopathy:    She has no cervical adenopathy.  Neurological: She is alert  and oriented to person, place, and time. She has normal reflexes. She displays normal reflexes. No cranial nerve deficit. She exhibits normal muscle tone. Coordination normal.  Skin: Skin is warm, dry and intact. No rash noted. She is not diaphoretic. No erythema. No pallor.  Psychiatric: She has a  normal mood and affect. Her speech is normal and behavior is normal. Judgment and thought content normal. Cognition and memory are normal.  Nursing note and vitals reviewed.   6CIT Screen 05/25/2016  What Year? 0 points  What month? 0 points  What time? 0 points  Count back from 20 0 points  Months in reverse 0 points  Repeat phrase 2 points  Total Score 2     Results for orders placed or performed during the hospital encounter of 12/10/14  CBC with Differential  Result Value Ref Range   WBC 6.3 3.6 - 11.0 K/uL   RBC 4.48 3.80 - 5.20 MIL/uL   Hemoglobin 14.0 12.0 - 16.0 g/dL   HCT 41.2 35.0 - 47.0 %   MCV 92.0 80.0 - 100.0 fL   MCH 31.3 26.0 - 34.0 pg   MCHC 34.1 32.0 - 36.0 g/dL   RDW 13.9 11.5 - 14.5 %   Platelets 175 150 - 440 K/uL   Neutrophils Relative % 46 %   Neutro Abs 2.9 1.4 - 6.5 K/uL   Lymphocytes Relative 46 %   Lymphs Abs 2.9 1.0 - 3.6 K/uL   Monocytes Relative 6 %   Monocytes Absolute 0.4 0.2 - 0.9 K/uL   Eosinophils Relative 1 %   Eosinophils Absolute 0.1 0 - 0.7 K/uL   Basophils Relative 1 %   Basophils Absolute 0.0 0 - 0.1 K/uL  Basic metabolic panel  Result Value Ref Range   Sodium 138 135 - 145 mmol/L   Potassium 3.6 3.5 - 5.1 mmol/L   Chloride 105 101 - 111 mmol/L   CO2 25 22 - 32 mmol/L   Glucose, Bld 95 65 - 99 mg/dL   BUN 11 6 - 20 mg/dL   Creatinine, Ser 1.07 (H) 0.44 - 1.00 mg/dL   Calcium 9.7 8.9 - 10.3 mg/dL   GFR calc non Af Amer 52 (L) >60 mL/min   GFR calc Af Amer >60 >60 mL/min   Anion gap 8 5 - 15  Troponin I  Result Value Ref Range   Troponin I <0.03 <0.031 ng/mL      Assessment & Plan:   Problem List Items Addressed This Visit        Digestive   Esophageal reflux    Checking labs. Await results.       Relevant Orders   CBC with Differential/Platelet   Comprehensive metabolic panel     Other   Lupus   Relevant Orders   VITAMIN D 25 Hydroxy (Vit-D Deficiency, Fractures)   Comprehensive metabolic panel   Generalized anxiety disorder    Controlled substance agreement signed today. Will give her 90 pills to last 3 months and follow up in 3 months. She is aware that she will not get early refills and will need to be seen to obtain her prescriptions. She is comfortable with this plan.      Relevant Orders   VITAMIN D 25 Hydroxy (Vit-D Deficiency, Fractures)   TSH   Comprehensive metabolic panel   Hyperlipidemia    Checking labs. Await results.       Relevant Orders   Lipid Panel w/o Chol/HDL Ratio   Comprehensive metabolic panel   Tobacco abuse    She is a candidate for low dose CT scanning. She is unsure if she wants to have screening. Information given. Will consider and will call us. She will contact us if she needs help quitting smoking. Information provided.  Relevant Orders   CBC with Differential/Platelet   UA/M w/rflx Culture, Routine   Controlled substance agreement signed   Advance care planning    Has not considered it. Packet given today. Will bring it home and consider and will bring [place] in a copy       Other Visit Diagnoses    Routine general medical examination at a health care facility    -  Primary   Up to date on vaccines. Screening labs checked today. Preventative care discussed as below.    Relevant Orders   VITAMIN D 25 Hydroxy (Vit-D Deficiency, Fractures)   TSH   Lipid Panel w/o Chol/HDL Ratio   CBC with Differential/Platelet   Comprehensive metabolic panel   HCV RNA quant   UA/M w/rflx Culture, Routine   Encounter for hepatitis C screening test for low risk patient       Order put in today. Await results.    Relevant Orders   HCV RNA quant   Anxiety state        Relevant Medications   ALPRAZolam (XANAX) 0.5 MG tablet      Preventative Services:  Health Risk Assessment and Personalized Prevention Plan: Done today Bone Mass Measurements: Up to date Breast Cancer Screening: Not a candidate CVD Screening: Done with blood work Cervical Cancer Screening: N/A Colon Cancer Screening: Up to date Depression Screening: Done previously Diabetes Screening: Done with blood work Glaucoma Screening: See your eye doctor Hepatitis B vaccine: N/A Hepatitis C screening: Done with blood work HIV Screening: Declined Flu Vaccine: Up to date Lung cancer Screening: Will consider Obesity Screening: Done today Pneumonia Vaccines (2): Up to date STI Screening: N/A  Follow up plan: Return in about 3 months (around 08/25/2016) for Anxiety follow up.   LABORATORY TESTING:  - Pap smear: not applicable  IMMUNIZATIONS:   - Tdap: Tetanus vaccination status reviewed: last tetanus booster within 10 years. - Influenza: Up to date - Pneumovax: Up to date - Prevnar: Up to date - Zostavax vaccine: Refused  SCREENING: -Mammogram: Not applicable  - Colonoscopy: Up to date  - Bone Density: Up to date  -Hearing Test: Ordered today   PATIENT COUNSELING:   Advised to take 1 mg of folate supplement per day if capable of pregnancy.   Sexuality: Discussed sexually transmitted diseases, partner selection, use of condoms, avoidance of unintended pregnancy  and contraceptive alternatives.   Advised to avoid cigarette smoking.  I discussed with the patient that most people either abstain from alcohol or drink within safe limits (<=14/week and <=4 drinks/occasion for males, <=7/weeks and <= 3 drinks/occasion for females) and that the risk for alcohol disorders and other health effects rises proportionally with the number of drinks per week and how often a drinker exceeds daily limits.  Discussed cessation/primary prevention of drug use and availability of treatment for abuse.    Diet: Encouraged to adjust caloric intake to maintain  or achieve ideal body weight, to reduce intake of dietary saturated fat and total fat, to limit sodium intake by avoiding high sodium foods and not adding table salt, and to maintain adequate dietary potassium and calcium preferably from fresh fruits, vegetables, and low-fat dairy products.    stressed the importance of regular exercise  Injury prevention: Discussed safety belts, safety helmets, smoke detector, smoking near bedding or upholstery.   Dental health: Discussed importance of regular tooth brushing, flossing, and dental visits.    NEXT PREVENTATIVE PHYSICAL DUE IN 1 YEAR. Return in about 3 months (  around 08/25/2016) for Anxiety follow up.

## 2016-05-25 NOTE — Assessment & Plan Note (Signed)
Controlled substance agreement signed today. Will give her 90 pills to last 3 months and follow up in 3 months. She is aware that she will not get early refills and will need to be seen to obtain her prescriptions. She is comfortable with this plan.

## 2016-05-25 NOTE — Patient Instructions (Addendum)
Preventative Services:  Health Risk Assessment and Personalized Prevention Plan: Done today Bone Mass Measurements: Up to date Breast Cancer Screening: Not a candidate CVD Screening: Done with blood work Cervical Cancer Screening: N/A Colon Cancer Screening: Up to date Depression Screening: Done previously Diabetes Screening: Done with blood work Glaucoma Screening: See your eye doctor Hepatitis B vaccine: N/A Hepatitis C screening: Done with blood work HIV Screening: Declined Flu Vaccine: Up to date Lung cancer Screening: Will consider Obesity Screening: Done today Pneumonia Vaccines (2): Up to date STI Screening: N/A  Health Maintenance, Female Adopting a healthy lifestyle and getting preventive care can go a long way to promote health and wellness. Talk with your health care provider about what schedule of regular examinations is right for you. This is a good chance for you to check in with your provider about disease prevention and staying healthy. In between checkups, there are plenty of things you can do on your own. Experts have done a lot of research about which lifestyle changes and preventive measures are most likely to keep you healthy. Ask your health care provider for more information. Weight and diet Eat a healthy diet  Be sure to include plenty of vegetables, fruits, low-fat dairy products, and lean protein.  Do not eat a lot of foods high in solid fats, added sugars, or salt.  Get regular exercise. This is one of the most important things you can do for your health.  Most adults should exercise for at least 150 minutes each week. The exercise should increase your heart rate and make you sweat (moderate-intensity exercise).  Most adults should also do strengthening exercises at least twice a week. This is in addition to the moderate-intensity exercise. Maintain a healthy weight  Body mass index (BMI) is a measurement that can be used to identify possible weight  problems. It estimates body fat based on height and weight. Your health care provider can help determine your BMI and help you achieve or maintain a healthy weight.  For females 34 years of age and older:  A BMI below 18.5 is considered underweight.  A BMI of 18.5 to 24.9 is normal.  A BMI of 25 to 29.9 is considered overweight.  A BMI of 30 and above is considered obese. Watch levels of cholesterol and blood lipids  You should start having your blood tested for lipids and cholesterol at 70 years of age, then have this test every 5 years.  You may need to have your cholesterol levels checked more often if:  Your lipid or cholesterol levels are high.  You are older than 70 years of age.  You are at high risk for heart disease. Cancer screening Lung Cancer  Lung cancer screening is recommended for adults 8-15 years old who are at high risk for lung cancer because of a history of smoking.  A yearly low-dose CT scan of the lungs is recommended for people who:  Currently smoke.  Have quit within the past 15 years.  Have at least a 30-pack-year history of smoking. A pack year is smoking an average of one pack of cigarettes a day for 1 year.  Yearly screening should continue until it has been 15 years since you quit.  Yearly screening should stop if you develop a health problem that would prevent you from having lung cancer treatment. Breast Cancer  Practice breast self-awareness. This means understanding how your breasts normally appear and feel.  It also means doing regular breast self-exams. Let your  health care provider know about any changes, no matter how small.  If you are in your 20s or 30s, you should have a clinical breast exam (CBE) by a health care provider every 1-3 years as part of a regular health exam.  If you are 40 or older, have a CBE every year. Also consider having a breast X-ray (mammogram) every year.  If you have a family history of breast cancer,  talk to your health care provider about genetic screening.  If you are at high risk for breast cancer, talk to your health care provider about having an MRI and a mammogram every year.  Breast cancer gene (BRCA) assessment is recommended for women who have family members with BRCA-related cancers. BRCA-related cancers include:  Breast.  Ovarian.  Tubal.  Peritoneal cancers.  Results of the assessment will determine the need for genetic counseling and BRCA1 and BRCA2 testing. Cervical Cancer  Your health care provider may recommend that you be screened regularly for cancer of the pelvic organs (ovaries, uterus, and vagina). This screening involves a pelvic examination, including checking for microscopic changes to the surface of your cervix (Pap test). You may be encouraged to have this screening done every 3 years, beginning at age 21.  For women ages 30-65, health care providers may recommend pelvic exams and Pap testing every 3 years, or they may recommend the Pap and pelvic exam, combined with testing for human papilloma virus (HPV), every 5 years. Some types of HPV increase your risk of cervical cancer. Testing for HPV may also be done on women of any age with unclear Pap test results.  Other health care providers may not recommend any screening for nonpregnant women who are considered low risk for pelvic cancer and who do not have symptoms. Ask your health care provider if a screening pelvic exam is right for you.  If you have had past treatment for cervical cancer or a condition that could lead to cancer, you need Pap tests and screening for cancer for at least 20 years after your treatment. If Pap tests have been discontinued, your risk factors (such as having a new sexual partner) need to be reassessed to determine if screening should resume. Some women have medical problems that increase the chance of getting cervical cancer. In these cases, your health care provider may recommend more  frequent screening and Pap tests. Colorectal Cancer  This type of cancer can be detected and often prevented.  Routine colorectal cancer screening usually begins at 70 years of age and continues through 70 years of age.  Your health care provider may recommend screening at an earlier age if you have risk factors for colon cancer.  Your health care provider may also recommend using home test kits to check for hidden blood in the stool.  A small camera at the end of a tube can be used to examine your colon directly (sigmoidoscopy or colonoscopy). This is done to check for the earliest forms of colorectal cancer.  Routine screening usually begins at age 50.  Direct examination of the colon should be repeated every 5-10 years through 70 years of age. However, you may need to be screened more often if early forms of precancerous polyps or small growths are found. Skin Cancer  Check your skin from head to toe regularly.  Tell your health care provider about any new moles or changes in moles, especially if there is a change in a mole's shape or color.  Also tell your   health care provider if you have a mole that is larger than the size of a pencil eraser.  Always use sunscreen. Apply sunscreen liberally and repeatedly throughout the day.  Protect yourself by wearing long sleeves, pants, a wide-brimmed hat, and sunglasses whenever you are outside. Heart disease, diabetes, and high blood pressure  High blood pressure causes heart disease and increases the risk of stroke. High blood pressure is more likely to develop in:  People who have blood pressure in the high end of the normal range (130-139/85-89 mm Hg).  People who are overweight or obese.  People who are African American.  If you are 18-39 years of age, have your blood pressure checked every 3-5 years. If you are 40 years of age or older, have your blood pressure checked every year. You should have your blood pressure measured  twice-once when you are at a hospital or clinic, and once when you are not at a hospital or clinic. Record the average of the two measurements. To check your blood pressure when you are not at a hospital or clinic, you can use:  An automated blood pressure machine at a pharmacy.  A home blood pressure monitor.  If you are between 55 years and 79 years old, ask your health care provider if you should take aspirin to prevent strokes.  Have regular diabetes screenings. This involves taking a blood sample to check your fasting blood sugar level.  If you are at a normal weight and have a low risk for diabetes, have this test once every three years after 70 years of age.  If you are overweight and have a high risk for diabetes, consider being tested at a younger age or more often. Preventing infection Hepatitis B  If you have a higher risk for hepatitis B, you should be screened for this virus. You are considered at high risk for hepatitis B if:  You were born in a country where hepatitis B is common. Ask your health care provider which countries are considered high risk.  Your parents were born in a high-risk country, and you have not been immunized against hepatitis B (hepatitis B vaccine).  You have HIV or AIDS.  You use needles to inject street drugs.  You live with someone who has hepatitis B.  You have had sex with someone who has hepatitis B.  You get hemodialysis treatment.  You take certain medicines for conditions, including cancer, organ transplantation, and autoimmune conditions. Hepatitis C  Blood testing is recommended for:  Everyone born from 1945 through 1965.  Anyone with known risk factors for hepatitis C. Sexually transmitted infections (STIs)  You should be screened for sexually transmitted infections (STIs) including gonorrhea and chlamydia if:  You are sexually active and are younger than 70 years of age.  You are older than 70 years of age and your  health care provider tells you that you are at risk for this type of infection.  Your sexual activity has changed since you were last screened and you are at an increased risk for chlamydia or gonorrhea. Ask your health care provider if you are at risk.  If you do not have HIV, but are at risk, it may be recommended that you take a prescription medicine daily to prevent HIV infection. This is called pre-exposure prophylaxis (PrEP). You are considered at risk if:  You are sexually active and do not regularly use condoms or know the HIV status of your partner(s).  You take drugs   by injection.  You are sexually active with a partner who has HIV. Talk with your health care provider about whether you are at high risk of being infected with HIV. If you choose to begin PrEP, you should first be tested for HIV. You should then be tested every 3 months for as long as you are taking PrEP. Pregnancy  If you are premenopausal and you may become pregnant, ask your health care provider about preconception counseling.  If you may become pregnant, take 400 to 800 micrograms (mcg) of folic acid every day.  If you want to prevent pregnancy, talk to your health care provider about birth control (contraception). Osteoporosis and menopause  Osteoporosis is a disease in which the bones lose minerals and strength with aging. This can result in serious bone fractures. Your risk for osteoporosis can be identified using a bone density scan.  If you are 46 years of age or older, or if you are at risk for osteoporosis and fractures, ask your health care provider if you should be screened.  Ask your health care provider whether you should take a calcium or vitamin D supplement to lower your risk for osteoporosis.  Menopause may have certain physical symptoms and risks.  Hormone replacement therapy may reduce some of these symptoms and risks. Talk to your health care provider about whether hormone replacement  therapy is right for you. Follow these instructions at home:  Schedule regular health, dental, and eye exams.  Stay current with your immunizations.  Do not use any tobacco products including cigarettes, chewing tobacco, or electronic cigarettes.  If you are pregnant, do not drink alcohol.  If you are breastfeeding, limit how much and how often you drink alcohol.  Limit alcohol intake to no more than 1 drink per day for nonpregnant women. One drink equals 12 ounces of beer, 5 ounces of wine, or 1 ounces of hard liquor.  Do not use street drugs.  Do not share needles.  Ask your health care provider for help if you need support or information about quitting drugs.  Tell your health care provider if you often feel depressed.  Tell your health care provider if you have ever been abused or do not feel safe at home. This information is not intended to replace advice given to you by your health care provider. Make sure you discuss any questions you have with your health care provider. Document Released: 09/19/2010 Document Revised: 08/12/2015 Document Reviewed: 12/08/2014 Elsevier Interactive Patient Education  2017 [place] Maintenance for Postmenopausal Women Menopause is a normal process in which your reproductive ability comes to an end. This process happens gradually over a span of months to years, usually between the ages of 64 and 25. Menopause is complete when you have missed 12 consecutive menstrual periods. It is important to talk with your health care provider about some of the most common conditions that affect postmenopausal women, such as heart disease, cancer, and bone loss (osteoporosis). Adopting a healthy lifestyle and getting preventive care can help to promote your health and wellness. Those actions can also lower your chances of developing some of these common conditions. What should I know about menopause? During menopause, you may experience a number of  symptoms, such as:  Moderate-to-severe hot flashes.  Night sweats.  Decrease in sex drive.  Mood swings.  Headaches.  Tiredness.  Irritability.  Memory problems.  Insomnia. Choosing to treat or not to treat menopausal changes is an individual decision that you  make with your health care provider. What should I know about hormone replacement therapy and supplements? Hormone therapy products are effective for treating symptoms that are associated with menopause, such as hot flashes and night sweats. Hormone replacement carries certain risks, especially as you become older. If you are thinking about using estrogen or estrogen with progestin treatments, discuss the benefits and risks with your health care provider. What should I know about heart disease and stroke? Heart disease, heart attack, and stroke become more likely as you age. This may be due, in part, to the hormonal changes that your body experiences during menopause. These can affect how your body processes dietary fats, triglycerides, and cholesterol. Heart attack and stroke are both medical emergencies. There are many things that you can do to help prevent heart disease and stroke:  Have your blood pressure checked at least every 1-2 years. High blood pressure causes heart disease and increases the risk of stroke.  If you are 49-97 years old, ask your health care provider if you should take aspirin to prevent a heart attack or a stroke.  Do not use any tobacco products, including cigarettes, chewing tobacco, or electronic cigarettes. If you need help quitting, ask your health care provider.  It is important to eat a healthy diet and maintain a healthy weight.  Be sure to include plenty of vegetables, fruits, low-fat dairy products, and lean protein.  Avoid eating foods that are high in solid fats, added sugars, or salt (sodium).  Get regular exercise. This is one of the most important things that you can do for your  health.  Try to exercise for at least 150 minutes each week. The type of exercise that you do should increase your heart rate and make you sweat. This is known as moderate-intensity exercise.  Try to do strengthening exercises at least twice each week. Do these in addition to the moderate-intensity exercise.  Know your numbers.Ask your health care provider to check your cholesterol and your blood glucose. Continue to have your blood tested as directed by your health care provider. What should I know about cancer screening? There are several types of cancer. Take the following steps to reduce your risk and to catch any cancer development as early as possible. Breast Cancer  Practice breast self-awareness.  This means understanding how your breasts normally appear and feel.  It also means doing regular breast self-exams. Let your health care provider know about any changes, no matter how small.  If you are 28 or older, have a clinician do a breast exam (clinical breast exam or CBE) every year. Depending on your age, family history, and medical history, it may be recommended that you also have a yearly breast X-ray (mammogram).  If you have a family history of breast cancer, talk with your health care provider about genetic screening.  If you are at high risk for breast cancer, talk with your health care provider about having an MRI and a mammogram every year.  Breast cancer (BRCA) gene test is recommended for women who have family members with BRCA-related cancers. Results of the assessment will determine the need for genetic counseling and BRCA1 and for BRCA2 testing. BRCA-related cancers include these types:  Breast. This occurs in males or females.  Ovarian.  Tubal. This may also be called fallopian tube cancer.  Cancer of the abdominal or pelvic lining (peritoneal cancer).  Prostate.  Pancreatic. Cervical, Uterine, and Ovarian Cancer  Your health care provider may recommend  that  you be screened regularly for cancer of the pelvic organs. These include your ovaries, uterus, and vagina. This screening involves a pelvic exam, which includes checking for microscopic changes to the surface of your cervix (Pap test).  For women ages 21-65, health care providers may recommend a pelvic exam and a Pap test every three years. For women ages 30-65, they may recommend the Pap test and pelvic exam, combined with testing for human papilloma virus (HPV), every five years. Some types of HPV increase your risk of cervical cancer. Testing for HPV may also be done on women of any age who have unclear Pap test results.  Other health care providers may not recommend any screening for nonpregnant women who are considered low risk for pelvic cancer and have no symptoms. Ask your health care provider if a screening pelvic exam is right for you.  If you have had past treatment for cervical cancer or a condition that could lead to cancer, you need Pap tests and screening for cancer for at least 20 years after your treatment. If Pap tests have been discontinued for you, your risk factors (such as having a new sexual partner) need to be reassessed to determine if you should start having screenings again. Some women have medical problems that increase the chance of getting cervical cancer. In these cases, your health care provider may recommend that you have screening and Pap tests more often.  If you have a family history of uterine cancer or ovarian cancer, talk with your health care provider about genetic screening.  If you have vaginal bleeding after reaching menopause, tell your health care provider.  There are currently no reliable tests available to screen for ovarian cancer. Lung Cancer  Lung cancer screening is recommended for adults 55-80 years old who are at high risk for lung cancer because of a history of smoking. A yearly low-dose CT scan of the lungs is recommended if you:  Currently  smoke.  Have a history of at least 30 pack-years of smoking and you currently smoke or have quit within the past 15 years. A pack-year is smoking an average of one pack of cigarettes per day for one year. Yearly screening should:  Continue until it has been 15 years since you quit.  Stop if you develop a health problem that would prevent you from having lung cancer treatment. Colorectal Cancer  This type of cancer can be detected and can often be prevented.  Routine colorectal cancer screening usually begins at age 50 and continues through age 75.  If you have risk factors for colon cancer, your health care provider may recommend that you be screened at an earlier age.  If you have a family history of colorectal cancer, talk with your health care provider about genetic screening.  Your health care provider may also recommend using home test kits to check for hidden blood in your stool.  A small camera at the end of a tube can be used to examine your colon directly (sigmoidoscopy or colonoscopy). This is done to check for the earliest forms of colorectal cancer.  Direct examination of the colon should be repeated every 5-10 years until age 75. However, if early forms of precancerous polyps or small growths are found or if you have a family history or genetic risk for colorectal cancer, you may need to be screened more often. Skin Cancer  Check your skin from head to toe regularly.  Monitor any moles. Be sure to tell your health   care provider:  About any new moles or changes in moles, especially if there is a change in a mole's shape or color.  If you have a mole that is larger than the size of a pencil eraser.  If any of your family members has a history of skin cancer, especially at a young age, talk with your health care provider about genetic screening.  Always use sunscreen. Apply sunscreen liberally and repeatedly throughout the day.  Whenever you are outside, protect  yourself by wearing long sleeves, pants, a wide-brimmed hat, and sunglasses. What should I know about osteoporosis? Osteoporosis is a condition in which bone destruction happens more quickly than new bone creation. After menopause, you may be at an increased risk for osteoporosis. To help prevent osteoporosis or the bone fractures that can happen because of osteoporosis, the following is recommended:  If you are 3-13 years old, get at least 1,000 mg of calcium and at least 600 mg of vitamin D per day.  If you are older than age 28 but younger than age 7, get at least 1,200 mg of calcium and at least 600 mg of vitamin D per day.  If you are older than age 30, get at least 1,200 mg of calcium and at least 800 mg of vitamin D per day. Smoking and excessive alcohol intake increase the risk of osteoporosis. Eat foods that are rich in calcium and vitamin D, and do weight-bearing exercises several times each week as directed by your health care provider. What should I know about how menopause affects my mental health? Depression may occur at any age, but it is more common as you become older. Common symptoms of depression include:  Low or sad mood.  Changes in sleep patterns.  Changes in appetite or eating patterns.  Feeling an overall lack of motivation or enjoyment of activities that you previously enjoyed.  Frequent crying spells. Talk with your health care provider if you think that you are experiencing depression. What should I know about immunizations? It is important that you get and maintain your immunizations. These include:  Tetanus, diphtheria, and pertussis (Tdap) booster vaccine.  Influenza every year before the flu season begins.  Pneumonia vaccine.  Shingles vaccine. Your health care provider may also recommend other immunizations. This information is not intended to replace advice given to you by your health care provider. Make sure you discuss any questions you have with  your health care provider. Document Released: 04/28/2005 Document Revised: 09/24/2015 Document Reviewed: 12/08/2014 Elsevier Interactive Patient Education  2017 [place] Screening A lung cancer screening is a test that checks for lung cancer. Lung cancer screening is done to look for lung cancer in its very early stages, before it spreads and becomes harder to treat and before symptoms appear. Finding cancer early improves the chances of successful treatment. It may save your life. Should I be screened for lung cancer? You should be screened for lung cancer if all of these apply:  You currently smoke or you have quit smoking within the past 15 years.  You are 33-64 years old. Screening may be recommended up to age 49 depending on your overall health and other factors.  You are in good general health.  You have a 30-pack-year smoking history. To find your pack-year history, multiply how many packages of cigarettes you smoked each day by the number of years you smoked. For example, if you smoked two packs of cigarettes each day for 15 years,  your pack-year history is 30. If you are not sure what your pack-year smoking history is, ask your health care provider. Screening may also be recommended if you are at high risk for the disease. You may be at high risk if:  You have a family history of lung cancer.  You have been exposed to asbestos.  You have chronic obstructive pulmonary disease (COPD).  You have a history of previous lung cancer. How often should I be screened for lung cancer? If you are at risk for lung cancer, it is recommended that you are screened once a year. The recommended screening test is a low-dose CT scan. How can I lower my risk of lung cancer? To lower your risk of developing lung cancer:  If you smoke, stop smoking all tobacco products.  Avoid secondhand smoke.  Avoid exposure to radiation.  Avoid exposure to radon gas. Have your home  checked for radon regularly.  Avoid things that cause cancer (carcinogens).  Avoid living or working in places with high air pollution. Where to find more information: Ask your health care provider about the risks and benefits of screening. More information and resources are available from these organizations:  American Cancer Society (ACS): www.cancer.org  American Lung Association: www.lung.org Contact a health care provider if:  You start to show symptoms of lung cancer, including:  Coughing that will not go away.  Wheezing.  Chest pain.  Coughing up blood.  Shortness of breath.  Weight loss that cannot be explained.  Constant fatigue. Summary  Lung cancer screening may find lung cancer before symptoms appear. Finding cancer early improves the chances of successful treatment. It may save your life.  If you are at risk for lung cancer, it is recommended that you are screened once a year. The recommended screening test is a low-dose CT scan.  You can make lifestyle changes to lower your risk of lung cancer.  Ask your health care provider about the risks and benefits of screening. This information is not intended to replace advice given to you by your health care provider. Make sure you discuss any questions you have with your health care provider. Document Released: 01/26/2016 Document Revised: 01/26/2016 Document Reviewed: 01/26/2016 Elsevier Interactive Patient Education  2017 Elsevier Inc.  

## 2016-05-26 ENCOUNTER — Other Ambulatory Visit: Payer: Self-pay

## 2016-05-26 DIAGNOSIS — Z299 Encounter for prophylactic measures, unspecified: Secondary | ICD-10-CM

## 2016-05-26 NOTE — Progress Notes (Signed)
Patient in office today for Lab work only drawn from right arm.Physical was done @ [facility]. Wellness form in file

## 2016-05-27 LAB — URINALYSIS
Bilirubin, UA: NEGATIVE
Glucose, UA: NEGATIVE
Ketones, UA: NEGATIVE
LEUKOCYTES UA: NEGATIVE
NITRITE UA: NEGATIVE
PH UA: 5.5 (ref 5.0–7.5)
Protein, UA: NEGATIVE
RBC, UA: NEGATIVE
Specific Gravity, UA: 1.006 (ref 1.005–1.030)
Urobilinogen, Ur: 0.2 mg/dL (ref 0.2–1.0)

## 2016-05-27 LAB — CMP12+LP+TP+TSH+6AC+CBC/D/PLT
ALK PHOS: 88 IU/L (ref 39–117)
ALT: 15 IU/L (ref 0–32)
AST: 10 IU/L (ref 0–40)
Albumin/Globulin Ratio: 2.1 (ref 1.2–2.2)
Albumin: 4.2 g/dL (ref 3.5–4.8)
BASOS: 0 %
BILIRUBIN TOTAL: 0.4 mg/dL (ref 0.0–1.2)
BUN / CREAT RATIO: 12 (ref 12–28)
BUN: 12 mg/dL (ref 8–27)
Basophils Absolute: 0 10*3/uL (ref 0.0–0.2)
CHLORIDE: 104 mmol/L (ref 96–106)
CHOL/HDL RATIO: 4.5 ratio — AB (ref 0.0–4.4)
CREATININE: 1.03 mg/dL — AB (ref 0.57–1.00)
Calcium: 9.5 mg/dL (ref 8.7–10.3)
Cholesterol, Total: 192 mg/dL (ref 100–199)
EOS (ABSOLUTE): 0.1 10*3/uL (ref 0.0–0.4)
EOS: 2 %
ESTIMATED CHD RISK: 1.1 times avg. — AB (ref 0.0–1.0)
FREE THYROXINE INDEX: 2.4 (ref 1.2–4.9)
GFR calc Af Amer: 64 mL/min/{1.73_m2} (ref >59)
GFR calc non Af Amer: 55 mL/min/{1.73_m2} — ABNORMAL LOW (ref >59)
GGT: 18 IU/L (ref 0–60)
Globulin, Total: 2 g/dL (ref 1.5–4.5)
Glucose: 88 mg/dL (ref 65–99)
HDL: 43 mg/dL (ref >39)
HEMATOCRIT: 39.2 % (ref 34.0–46.6)
HEMOGLOBIN: 12.9 g/dL (ref 11.1–15.9)
IMMATURE GRANULOCYTES: 0 %
Immature Grans (Abs): 0 10*3/uL (ref 0.0–0.1)
Iron: 78 ug/dL (ref 27–139)
LDH: 176 IU/L (ref 119–226)
LDL CALC: 129 mg/dL — AB (ref 0–99)
LYMPHS ABS: 1.8 10*3/uL (ref 0.7–3.1)
Lymphs: 36 %
MCH: 30.6 pg (ref 26.6–33.0)
MCHC: 32.9 g/dL (ref 31.5–35.7)
MCV: 93 fL (ref 79–97)
Monocytes Absolute: 0.4 10*3/uL (ref 0.1–0.9)
Monocytes: 8 %
NEUTROS PCT: 54 %
Neutrophils Absolute: 2.7 10*3/uL (ref 1.4–7.0)
Phosphorus: 3.3 mg/dL (ref 2.5–4.5)
Platelets: 172 10*3/uL (ref 150–379)
Potassium: 4.2 mmol/L (ref 3.5–5.2)
RBC: 4.21 x10E6/uL (ref 3.77–5.28)
RDW: 14.1 % (ref 12.3–15.4)
SODIUM: 142 mmol/L (ref 134–144)
T3 Uptake Ratio: 33 % (ref 24–39)
T4, Total: 7.4 ug/dL (ref 4.5–12.0)
TSH: 1.48 u[IU]/mL (ref 0.450–4.500)
Total Protein: 6.2 g/dL (ref 6.0–8.5)
Triglycerides: 102 mg/dL (ref 0–149)
Uric Acid: 4.8 mg/dL (ref 2.5–7.1)
VLDL CHOLESTEROL CAL: 20 mg/dL (ref 5–40)
WBC: 5 10*3/uL (ref 3.4–10.8)

## 2016-05-27 LAB — VITAMIN D 25 HYDROXY (VIT D DEFICIENCY, FRACTURES): Vit D, 25-Hydroxy: 13.2 ng/mL — ABNORMAL LOW (ref 30.0–100.0)

## 2016-05-27 LAB — HEPATITIS C ANTIBODY (REFLEX)

## 2016-05-27 LAB — HCV COMMENT:

## 2016-05-29 ENCOUNTER — Other Ambulatory Visit: Payer: Self-pay | Admitting: Family Medicine

## 2016-05-29 ENCOUNTER — Encounter: Payer: Self-pay | Admitting: Family Medicine

## 2016-05-29 DIAGNOSIS — E559 Vitamin D deficiency, unspecified: Secondary | ICD-10-CM

## 2016-05-29 MED ORDER — VITAMIN D (ERGOCALCIFEROL) 1.25 MG (50000 UNIT) PO CAPS: 50000.0000 [IU] | ORAL_CAPSULE | ORAL | 0 refills | Status: DC

## 2016-06-08 ENCOUNTER — Encounter: Payer: Self-pay | Admitting: *Deleted

## 2016-06-14 ENCOUNTER — Encounter: Payer: Self-pay | Admitting: General Surgery

## 2016-06-14 ENCOUNTER — Ambulatory Visit (INDEPENDENT_AMBULATORY_CARE_PROVIDER_SITE_OTHER): Payer: Managed Care, Other (non HMO) | Admitting: General Surgery

## 2016-06-14 VITALS — BP 124/74 | HR 83 | Resp 14 | Ht 63.0 in | Wt 144.0 lb

## 2016-06-14 DIAGNOSIS — Z853 Personal history of malignant neoplasm of breast: Secondary | ICD-10-CM

## 2016-06-14 DIAGNOSIS — L989 Disorder of the skin and subcutaneous tissue, unspecified: Secondary | ICD-10-CM | POA: Diagnosis not present

## 2016-06-14 NOTE — Patient Instructions (Signed)
You may shower, remove waterproof dressing in 2 days. Leave the steri strips in place, they will come off in 2 weeks. We will call you with results.

## 2016-06-14 NOTE — Progress Notes (Signed)
Patient ID: Maureen [name], female   DOB: 1945-06-30, 71 y.o.   MRN: [id number]  Chief Complaint  Patient presents with  . Breast Cancer    HPI Maureen [name] is a 71 y.o. female here today for her yearly follow up breast cancer. Patient states she is doing well.  HPI  Past Medical History:  Diagnosis Date  . Arthritis 2005  . 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 This was a histologic grade 2 tumor measuring just under 1 cm in diameter. No invasive cancer was identified. ER 90%, PR 40%.  . 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  . Discoid lupus 2005  . Heart murmur 2005  . Hypercholesterolemia 2012  . Mammographic microcalcification 2011  . Panic attack   . Personal history of colonic polyps    Colon polyps  . Personal history of tobacco use, presenting hazards to health   . PUD (peptic ulcer disease)    Last EGD 1/09  . Rectal polyp 2008   villous adenoma; last colonoscopy 1/09 WNL  . Skin cancer, basal cell 2015   right cheek Mohs surgery at [facility]  . Ulcer [facility])     Past Surgical History:  Procedure Laterality Date  . APPENDECTOMY  2011  . BREAST BIOPSY  12/27/2009   right breast  . CARDIAC CATHETERIZATION  1996   Negative Cath  . COLONOSCOPY N/A 08/05/2014   Procedure: COLONOSCOPY;  Surgeon: [name], MD;  Location: [facility] ENDOSCOPY;  EGD, tubular adenoma 1.  . COLONOSCOPY W/ BIOPSIES  2005,2009   Dr. Bary Castilla. Normal exam 2009. Villous adenoma of the rectum in 2005 adenomatous polyp from the ascending colon and rectum in 2002.  . ESOPHAGOGASTRODUODENOSCOPY N/A 08/05/2014   Procedure: ESOPHAGOGASTRODUODENOSCOPY (EGD);  Surgeon: [name], MD;  Location: [facility] ENDOSCOPY;  Service: Endoscopy;  Laterality: N/A;  . MASTECTOMY     bilateral  . SIMPLE MASTECTOMY  1993   right   . SIMPLE MASTECTOMY  2011   left  . TONSILLECTOMY  1997  . UPPER GI ENDOSCOPY  2003, 2005, 2009, 2012, 2014   2012 showed mild reflux changes without evidence of Barrett's epithelial changes 2014 biopsies at 37 cm showed changes suggestive of reflux esophagitis. No metaplasia.  Marland Kitchen VAGINAL HYSTERECTOMY  1977    Family History  Problem Relation Age of Onset  . Cancer Father     esophagus  . Cancer Maternal Aunt     breast cancer  . Cancer Maternal Grandmother     breast cancer    Social History Social History  Substance Use Topics  . Smoking status: Current Every Day Smoker    Packs/day: 1.00    Years: 40.00    Types: Cigarettes  . Smokeless tobacco: Never Used     Comment: Pt wants to do hypnotizing  . Alcohol use 0.6 - 1.2 oz/week    1 - 2 Standard drinks or equivalent per week    Allergies  Allergen Reactions  . Codeine Itching  . Sulfa Antibiotics Hives  . Sulfinpyrazone   . Erythromycin Rash  . Penicillins Rash    Current Outpatient Prescriptions  Medication Sig Dispense Refill  . acetaminophen (TYLENOL) 500 MG tablet Take 500 mg by mouth every 6 (six) hours as needed for headache.    . ALPRAZolam (XANAX) 0.5 MG tablet Take 0.5-1  tablets (0.25-0.5 mg total) by mouth 2 (two) times daily as needed for anxiety. 90 tablet 0  . budesonide-formoterol (SYMBICORT) 160-4.5 MCG/ACT inhaler Inhale 2 puffs into the lungs 2 (two) times daily. 1 Inhaler 6  . Cholecalciferol (VITAMIN D) 2000 UNITS CAPS Take 1 capsule by mouth every other day.    . clobetasol cream (TEMOVATE) 6.01 % Apply 1 application topically daily.    . Homeopathic Products (ZICAM ALLERGY RELIEF NA) Place 2 sprays into the nose daily as needed (allergies).     Marland Kitchen omeprazole (PRILOSEC) 20 MG capsule Take 1 capsule (20 mg total) by mouth daily as needed (acid reflux). 90 capsule 4  . RA KRILL OIL 500 MG CAPS Take 500 mg by mouth daily.    . Vitamin D, Ergocalciferol, (DRISDOL) 50000 units CAPS capsule Take 1  capsule (50,000 Units total) by mouth every 7 (seven) days. 12 capsule 0  . zinc gluconate 50 MG tablet Take 50 mg by mouth every other day.     No current facility-administered medications for this visit.     Review of Systems Review of Systems  Constitutional: Negative.   Respiratory: Negative.   Cardiovascular: Negative.     Blood pressure 124/74, pulse 83, resp. rate 14, height '5\' 3"'$  (1.6 m), weight 144 lb (65.3 kg).  Physical Exam Physical Exam  Constitutional: She is oriented to person, place, and time. She appears well-developed and well-nourished.  Eyes: Conjunctivae are normal.  Neck: Neck supple.  Cardiovascular: Normal rate, regular rhythm and normal heart sounds.   Pulmonary/Chest: Effort normal.    6 mm patchy area right chest wall.   Lymphadenopathy:    She has no cervical adenopathy.    She has no axillary adenopathy.       Right: No supraclavicular adenopathy present.       Left: No supraclavicular adenopathy present.  Neurological: She is alert and oriented to person, place, and time.  Skin: Skin is warm and dry.      Assessment    No evidence of recurrent breast cancer.  Plaque-like area on the right anterior chest wall suggestive of basal cell carcinoma.    Plan    The patient was amenable to a punch biopsy of the right chest wall lesion. The area was prepped with alcohol followed by 3 mL of 0.5% Xylocaine with 0.25% Marcaine with 1 200,000 epinephrine. ChloraPrep was applied to the skin and a 2.5 mm punch was obtained and sent for routine histology. The skin defect was closed with benzoin, Steri-Strips followed by Telfa and Tegaderm dressing.  The patient will be contacted when pathology is available.  A prescription for bilateral breast prostheses and surgical bras were provided.   This information has been scribed by Gaspar Cola CMA.   [name] 06/15/2016, 7:41 AM

## 2016-06-15 DIAGNOSIS — L989 Disorder of the skin and subcutaneous tissue, unspecified: Secondary | ICD-10-CM | POA: Insufficient documentation

## 2016-07-19 ENCOUNTER — Other Ambulatory Visit: Payer: Self-pay | Admitting: Family Medicine

## 2016-07-19 DIAGNOSIS — F411 Generalized anxiety disorder: Secondary | ICD-10-CM

## 2016-08-21 ENCOUNTER — Other Ambulatory Visit: Payer: Self-pay | Admitting: Family Medicine

## 2016-08-21 DIAGNOSIS — F411 Generalized anxiety disorder: Secondary | ICD-10-CM

## 2016-08-21 NOTE — Telephone Encounter (Signed)
Patient needs a refill sent in for her Alprazolam to [facility] ASAP.  She has taken her last pill.  Thanks  [facility] [place] can be reached at [phone]

## 2016-08-22 NOTE — Telephone Encounter (Signed)
Maureen [name]: Please get patient scheduled for a visit.

## 2016-08-23 ENCOUNTER — Other Ambulatory Visit: Payer: Self-pay | Admitting: Family Medicine

## 2016-08-23 DIAGNOSIS — F411 Generalized anxiety disorder: Secondary | ICD-10-CM

## 2016-08-23 NOTE — Telephone Encounter (Signed)
Patient called in regards to needing a refill on her prescription for Alprazolam (XANAX) 0.5 mg tablet. Patient is completely out of medication and asks if we could inform her once the medication has been sent in to the pharmacy.   Please Advise.  Thank you

## 2016-08-23 NOTE — Telephone Encounter (Signed)
Routing to provider  

## 2016-08-24 MED ORDER — ALPRAZOLAM 0.5 MG PO TABS: 0.2500 mg | ORAL_TABLET | Freq: Two times a day (BID) | ORAL | 0 refills | Status: DC | PRN

## 2016-08-24 NOTE — Telephone Encounter (Signed)
Called into pharmacy, left on machine.

## 2016-08-24 NOTE — Telephone Encounter (Signed)
OK to call in

## 2016-08-25 ENCOUNTER — Encounter: Payer: Self-pay | Admitting: Family Medicine

## 2016-08-25 ENCOUNTER — Ambulatory Visit (INDEPENDENT_AMBULATORY_CARE_PROVIDER_SITE_OTHER): Payer: Managed Care, Other (non HMO) | Admitting: Family Medicine

## 2016-08-25 VITALS — BP 129/78 | HR 62 | Temp 98.3°F | Wt 144.0 lb

## 2016-08-25 DIAGNOSIS — F411 Generalized anxiety disorder: Secondary | ICD-10-CM

## 2016-08-25 MED ORDER — ALPRAZOLAM 0.5 MG PO TABS: 0.2500 mg | ORAL_TABLET | Freq: Two times a day (BID) | ORAL | 0 refills | Status: DC | PRN

## 2016-08-25 NOTE — Assessment & Plan Note (Signed)
Slightly exacerbated due to grandson being arrested. Refill given. Recheck 4 months when she is due again. Call with any concerns.

## 2016-08-25 NOTE — Progress Notes (Signed)
BP 129/78 (BP Location: Left Arm, Patient Position: Sitting, Cuff Size: Normal)   Pulse 62   Temp 98.3 F (36.8 C)   Wt 144 lb (65.3 kg)   SpO2 98%   BMI 25.51 kg/m    Subjective:    Patient ID: Maureen [name], female    DOB: May 28, 1945, 71 y.o.   MRN: [id number]  HPI: Maureen [name] is a 71 y.o. female  Chief Complaint  Patient presents with  . Anxiety   ANXIETY/STRESS- Her grandson got arrested and has been taking a little bit more the past couple of weeks Duration:exacerbated Anxious mood: yes  Excessive worrying: yes Irritability: no  Sweating: no Nausea: no Palpitations:no Hyperventilation: no Panic attacks: yes Agoraphobia: no  Obscessions/compulsions: no Depressed mood: no Depression screen [facility] 2/9 08/25/2016 05/25/2016 05/23/2016 03/02/2015 02/18/2014  Decreased Interest 0 0 0 0 0  Down, Depressed, Hopeless 1 0 0 0 0  PHQ - 2 Score 1 0 0 0 0  Altered sleeping 0 - - - -  Tired, decreased energy 0 - - - -  Change in appetite 0 - - - -  Feeling bad or failure about yourself  0 - - - -  Trouble concentrating 0 - - - -  Moving slowly or fidgety/restless 0 - - - -  Suicidal thoughts 0 - - - -  PHQ-9 Score 1 - - - -   GAD 7 : Generalized Anxiety Score 08/25/2016  Nervous, Anxious, on Edge 1  Control/stop worrying 0  Worry too much - different things 0  Trouble relaxing 1  Restless 1  Easily annoyed or irritable 0  Afraid - awful might happen 0  Total GAD 7 Score 3  Anxiety Difficulty Not difficult at all    Anhedonia: no Weight changes: no Insomnia: yes hard to fall asleep  Hypersomnia: no Fatigue/loss of energy: yes Feelings of worthlessness: no Feelings of guilt: yes Impaired concentration/indecisiveness: no Suicidal ideations: no  Crying spells: no Recent Stressors/Life Changes: yes   Relationship problems: no   Family stress: yes     Financial stress: no    Job stress: no    Recent death/loss: no  Relevant past medical, surgical, family and  social history reviewed and updated as indicated. Interim medical history since our last visit reviewed. Allergies and medications reviewed and updated.  Review of Systems  Constitutional: Negative.   Respiratory: Negative.   Cardiovascular: Negative.   Psychiatric/Behavioral: Positive for sleep disturbance. Negative for agitation, behavioral problems, confusion, decreased concentration, dysphoric mood, hallucinations, self-injury and suicidal ideas. The patient is nervous/anxious. The patient is not hyperactive.     Per HPI unless specifically indicated above     Objective:    BP 129/78 (BP Location: Left Arm, Patient Position: Sitting, Cuff Size: Normal)   Pulse 62   Temp 98.3 F (36.8 C)   Wt 144 lb (65.3 kg)   SpO2 98%   BMI 25.51 kg/m   Wt Readings from Last 3 Encounters:  08/25/16 144 lb (65.3 kg)  06/14/16 144 lb (65.3 kg)  05/25/16 144 lb (65.3 kg)    Physical Exam  Constitutional: She is oriented to person, place, and time. She appears well-developed and well-nourished. No distress.  HENT:  Head: Normocephalic and atraumatic.  Right Ear: Hearing normal.  Left Ear: Hearing normal.  Nose: Nose normal.  Eyes: Conjunctivae and lids are normal. Right eye exhibits no discharge. Left eye exhibits no discharge. No scleral icterus.  Cardiovascular: Normal rate, regular  rhythm, normal heart sounds and intact distal pulses.  Exam reveals no gallop and no friction rub.   No murmur heard. Pulmonary/Chest: Effort normal and breath sounds normal. No respiratory distress. She has no wheezes. She has no rales. She exhibits no tenderness.  Musculoskeletal: Normal range of motion.  Neurological: She is alert and oriented to person, place, and time.  Skin: Skin is intact. No rash noted. She is not diaphoretic.  Psychiatric: She has a normal mood and affect. Her speech is normal and behavior is normal. Judgment and thought content normal. Cognition and memory are normal.  Nursing note  and vitals reviewed.   Results for orders placed or performed in visit on 05/26/16  Executive Panel  Result Value Ref Range   Glucose 88 65 - 99 mg/dL   Uric Acid 4.8 2.5 - 7.1 mg/dL   BUN 12 8 - 27 mg/dL   Creatinine, Ser 1.03 (H) 0.57 - 1.00 mg/dL   GFR calc non Af Amer 55 (L) >59 mL/min/1.73   GFR calc Af Amer 64 >59 mL/min/1.73   BUN/Creatinine Ratio 12 12 - 28   Sodium 142 134 - 144 mmol/L   Potassium 4.2 3.5 - 5.2 mmol/L   Chloride 104 96 - 106 mmol/L   Calcium 9.5 8.7 - 10.3 mg/dL   Phosphorus 3.3 2.5 - 4.5 mg/dL   Total Protein 6.2 6.0 - 8.5 g/dL   Albumin 4.2 3.5 - 4.8 g/dL   Globulin, Total 2.0 1.5 - 4.5 g/dL   Albumin/Globulin Ratio 2.1 1.2 - 2.2   Bilirubin Total 0.4 0.0 - 1.2 mg/dL   Alkaline Phosphatase 88 39 - 117 IU/L   LDH 176 119 - 226 IU/L   AST 10 0 - 40 IU/L   ALT 15 0 - 32 IU/L   GGT 18 0 - 60 IU/L   Iron 78 27 - 139 ug/dL   Cholesterol, Total 192 100 - 199 mg/dL   Triglycerides 102 0 - 149 mg/dL   HDL 43 >39 mg/dL   VLDL Cholesterol Cal 20 5 - 40 mg/dL   LDL Calculated 129 (H) 0 - 99 mg/dL   Chol/HDL Ratio 4.5 (H) 0.0 - 4.4 ratio units   Estimated CHD Risk 1.1 (H) 0.0 - 1.0  times avg.   TSH 1.480 0.450 - 4.500 uIU/mL   T4, Total 7.4 4.5 - 12.0 ug/dL   T3 Uptake Ratio 33 24 - 39 %   Free Thyroxine Index 2.4 1.2 - 4.9   WBC 5.0 3.4 - 10.8 x10E3/uL   RBC 4.21 3.77 - 5.28 x10E6/uL   Hemoglobin 12.9 11.1 - 15.9 g/dL   Hematocrit 39.2 34.0 - 46.6 %   MCV 93 79 - 97 fL   MCH 30.6 26.6 - 33.0 pg   MCHC 32.9 31.5 - 35.7 g/dL   RDW 14.1 12.3 - 15.4 %   Platelets 172 150 - 379 x10E3/uL   Neutrophils 54 Not Estab. %   Lymphs 36 Not Estab. %   Monocytes 8 Not Estab. %   Eos 2 Not Estab. %   Basos 0 Not Estab. %   Neutrophils Absolute 2.7 1.4 - 7.0 x10E3/uL   Lymphocytes Absolute 1.8 0.7 - 3.1 x10E3/uL   Monocytes Absolute 0.4 0.1 - 0.9 x10E3/uL   EOS (ABSOLUTE) 0.1 0.0 - 0.4 x10E3/uL   Basophils Absolute 0.0 0.0 - 0.2 x10E3/uL   Immature  Granulocytes 0 Not Estab. %   Immature Grans (Abs) 0.0 0.0 - 0.1 x10E3/uL  Vitamin D (  25 hydroxy)  Result Value Ref Range   Vit D, 25-Hydroxy 13.2 (L) 30.0 - 100.0 ng/mL  Hepatitis C antibody (reflex)  Result Value Ref Range   HCV Ab <0.1 0.0 - 0.9 s/co ratio  Urinalysis  Result Value Ref Range   Specific Gravity, UA 1.006 1.005 - 1.030   pH, UA 5.5 5.0 - 7.5   Color, UA Yellow Yellow   Appearance Ur Clear Clear   Leukocytes, UA Negative Negative   Protein, UA Negative Negative/Trace   Glucose, UA Negative Negative   Ketones, UA Negative Negative   RBC, UA Negative Negative   Bilirubin, UA Negative Negative   Urobilinogen, Ur 0.2 0.2 - 1.0 mg/dL   Nitrite, UA Negative Negative  HCV Comment:  Result Value Ref Range   Comment: Comment       Assessment & Plan:   Problem List Items Addressed This Visit      Other   Generalized anxiety disorder - Primary    Slightly exacerbated due to grandson being arrested. Refill given. Recheck 4 months when she is due again. Call with any concerns.       Relevant Medications   ALPRAZolam (XANAX) 0.5 MG tablet       Follow up plan: No Follow-up on file.

## 2016-11-14 ENCOUNTER — Other Ambulatory Visit: Payer: Self-pay | Admitting: Family Medicine

## 2016-11-14 ENCOUNTER — Telehealth: Payer: Self-pay | Admitting: Family Medicine

## 2016-11-14 DIAGNOSIS — F411 Generalized anxiety disorder: Secondary | ICD-10-CM

## 2016-11-14 NOTE — Telephone Encounter (Signed)
Pt called and stated that her refill for alprazolam was denied and she would like a call back.

## 2016-11-14 NOTE — Telephone Encounter (Signed)
90 pills should be lasting her 3 months. It's only been about 2 since she got it filled. Is she out? If she's needing it every day or 2x a day, she will need a follow up to discuss.

## 2016-11-14 NOTE — Telephone Encounter (Signed)
Routing to provider  

## 2016-11-15 NOTE — Telephone Encounter (Signed)
Patient was given the information regarding her alprazolam.  She said she only received 60 day supply from the pharmacy.  She was informed to call her pharmacy.  [facility] day [facility].  Just FYI

## 2016-11-15 NOTE — Telephone Encounter (Signed)
Called patient, no answer, unable to leave a message, will try again.   

## 2016-12-14 ENCOUNTER — Telehealth: Payer: Self-pay | Admitting: Family Medicine

## 2016-12-14 ENCOUNTER — Other Ambulatory Visit: Payer: Self-pay | Admitting: Family Medicine

## 2016-12-14 DIAGNOSIS — F411 Generalized anxiety disorder: Secondary | ICD-10-CM

## 2016-12-14 NOTE — Telephone Encounter (Signed)
Medication called into prescriber voicemail.

## 2016-12-14 NOTE — Telephone Encounter (Signed)
OK to call in

## 2016-12-14 NOTE — Telephone Encounter (Signed)
Medication called in already.

## 2016-12-26 ENCOUNTER — Encounter: Payer: Self-pay | Admitting: Physician Assistant

## 2016-12-26 ENCOUNTER — Ambulatory Visit: Payer: Self-pay | Admitting: Physician Assistant

## 2016-12-26 VITALS — BP 130/80 | HR 87 | Temp 98.6°F | Resp 16

## 2016-12-26 DIAGNOSIS — J440 Chronic obstructive pulmonary disease with acute lower respiratory infection: Secondary | ICD-10-CM

## 2016-12-26 MED ORDER — BENZONATATE 200 MG PO CAPS: 200.0000 mg | ORAL_CAPSULE | Freq: Three times a day (TID) | ORAL | 0 refills | Status: DC | PRN

## 2016-12-26 MED ORDER — LEVOFLOXACIN 500 MG PO TABS: 500.0000 mg | ORAL_TABLET | Freq: Every day | ORAL | 0 refills | Status: DC

## 2016-12-26 MED ORDER — BUDESONIDE-FORMOTEROL FUMARATE 160-4.5 MCG/ACT IN AERO: 2.0000 | INHALATION_SPRAY | Freq: Two times a day (BID) | RESPIRATORY_TRACT | 6 refills | Status: DC

## 2016-12-26 NOTE — Progress Notes (Signed)
S: C/o cough and congestion with wheezing and chest pain, chest is sore from coughing, denies fever, chills; mucus is green but  cough is dry and hacking; keeping pt awake at night;  denies cardiac type chest pain or sob, v/d, abd pain Remainder ros neg  O: vitals wnl, nad, tms clear, throat injected, neck supple no lymph, lungs c t a, cv rrr, neuro intact, cough is wet  A:  Acute bronchitis   P:  rx medication:  levaquin 500mg  qd, tessalon perls, refill on symbicort; smoking cessation, use otc meds, tylenol or motrin as needed for fever/chills, return if not better in 3 -5 days, return earlier if worsening

## 2016-12-29 ENCOUNTER — Ambulatory Visit: Payer: Managed Care, Other (non HMO) | Admitting: Family Medicine

## 2017-01-05 ENCOUNTER — Encounter: Payer: Self-pay | Admitting: Family Medicine

## 2017-01-05 ENCOUNTER — Ambulatory Visit (INDEPENDENT_AMBULATORY_CARE_PROVIDER_SITE_OTHER): Payer: Managed Care, Other (non HMO) | Admitting: Family Medicine

## 2017-01-05 VITALS — BP 128/80 | HR 79 | Temp 97.7°F | Wt 147.1 lb

## 2017-01-05 DIAGNOSIS — E782 Mixed hyperlipidemia: Secondary | ICD-10-CM | POA: Diagnosis not present

## 2017-01-05 DIAGNOSIS — F411 Generalized anxiety disorder: Secondary | ICD-10-CM | POA: Diagnosis not present

## 2017-01-05 DIAGNOSIS — E559 Vitamin D deficiency, unspecified: Secondary | ICD-10-CM | POA: Diagnosis not present

## 2017-01-05 MED ORDER — ALPRAZOLAM 0.5 MG PO TABS: ORAL_TABLET | ORAL | 2 refills | Status: DC

## 2017-01-05 NOTE — Assessment & Plan Note (Signed)
Not under great control. Discussed starting preventative medicine as she is taking xanax daily- she is not interested. Information provided about different medications. Discussed dangers of benzodiazapines in the elderly including increasing the risk of death, dementia and falls. She is aware.

## 2017-01-05 NOTE — Progress Notes (Signed)
BP 128/80 (BP Location: Left Arm, Patient Position: Sitting, Cuff Size: Normal)   Pulse 79   Temp 97.7 F (36.5 C)   Wt 147 lb 2 oz (66.7 kg)   SpO2 98%   BMI 26.06 kg/m    Subjective:    Patient ID: Maureen [name], female    DOB: 07/09/1945, 71 y.o.   MRN: [id number]  HPI: Maureen [name] is a 71 y.o. female  Chief Complaint  Patient presents with  . Anxiety  . Hyperlipidemia   ANXIETY/STRESS- continues to feel prettying stressed out. Taking about 1.5 pill a day- still seems to help, was on something 30 years afo and doesn't want to take anything preventative. States that she is very afraid of medicine.   Duration:stable Anxious mood: yes  Excessive worrying: yes Irritability: no  Sweating: no Nausea: no Palpitations:no Hyperventilation: no Panic attacks: yes- usually about 1x a week Agoraphobia: no  Obscessions/compulsions: no Depressed mood: no Depression screen [facility] 2/9 01/05/2017 08/25/2016 05/25/2016 05/23/2016 03/02/2015  Decreased Interest 0 0 0 0 0  Down, Depressed, Hopeless 0 1 0 0 0  PHQ - 2 Score 0 1 0 0 0  Altered sleeping - 0 - - -  Tired, decreased energy - 0 - - -  Change in appetite - 0 - - -  Feeling bad or failure about yourself  - 0 - - -  Trouble concentrating - 0 - - -  Moving slowly or fidgety/restless - 0 - - -  Suicidal thoughts - 0 - - -  PHQ-9 Score - 1 - - -   Anhedonia: no Weight changes: no Insomnia: yes hard to fall asleep  Hypersomnia: no Fatigue/loss of energy: yes Feelings of worthlessness: no Feelings of guilt: no Impaired concentration/indecisiveness: no Suicidal ideations: no  Crying spells: no Recent Stressors/Life Changes: no   Relationship problems: no   Family stress: yes     Financial stress: no    Job stress: no    Recent death/loss: no  HYPERLIPIDEMIA Hyperlipidemia status: stable Satisfied with current treatment?  yes Side effects:  Not on anything Medication compliance: Not on anything Past cholesterol  meds: none Supplements: none Aspirin:  no The 10-year ASCVD risk score Mikey Bussing DC Jr., et al., 2013) is: 15.7%   Values used to calculate the score:     Age: 40 years     Sex: Female     Is Non-Hispanic African American: No     Diabetic: No     Tobacco smoker: Yes     Systolic Blood Pressure: 562 mmHg     Is BP treated: No     HDL Cholesterol: 43 mg/dL     Total Cholesterol: 192 mg/dL Chest pain:  no Coronary artery disease:  no  Vitamin D supplement made her legs and joints ache a whole lot.   Relevant past medical, surgical, family and social history reviewed and updated as indicated. Interim medical history since our last visit reviewed. Allergies and medications reviewed and updated.  Review of Systems  Constitutional: Negative.   Respiratory: Negative.   Cardiovascular: Negative.   Psychiatric/Behavioral: Positive for sleep disturbance. Negative for agitation, behavioral problems, confusion, decreased concentration, dysphoric mood, hallucinations, self-injury and suicidal ideas. The patient is nervous/anxious. The patient is not hyperactive.     Per HPI unless specifically indicated above     Objective:    BP 128/80 (BP Location: Left Arm, Patient Position: Sitting, Cuff Size: Normal)   Pulse 79   Temp  97.7 F (36.5 C)   Wt 147 lb 2 oz (66.7 kg)   SpO2 98%   BMI 26.06 kg/m   Wt Readings from Last 3 Encounters:  01/05/17 147 lb 2 oz (66.7 kg)  08/25/16 144 lb (65.3 kg)  06/14/16 144 lb (65.3 kg)    Physical Exam  Constitutional: She is oriented to person, place, and time. She appears well-developed and well-nourished. No distress.  HENT:  Head: Normocephalic and atraumatic.  Right Ear: Hearing normal.  Left Ear: Hearing normal.  Nose: Nose normal.  Eyes: Conjunctivae and lids are normal. Right eye exhibits no discharge. Left eye exhibits no discharge. No scleral icterus.  Cardiovascular: Normal rate, regular rhythm, normal heart sounds and intact distal  pulses.  Exam reveals no gallop and no friction rub.   No murmur heard. Pulmonary/Chest: Effort normal and breath sounds normal. No respiratory distress. She has no wheezes. She has no rales. She exhibits no tenderness.  Musculoskeletal: Normal range of motion.  Neurological: She is alert and oriented to person, place, and time.  Skin: Skin is warm, dry and intact. No rash noted. She is not diaphoretic. No erythema. No pallor.  Psychiatric: Her speech is normal and behavior is normal. Judgment and thought content normal. Her mood appears anxious. Cognition and memory are normal.    Results for orders placed or performed in visit on 05/26/16  Executive Panel  Result Value Ref Range   Glucose 88 65 - 99 mg/dL   Uric Acid 4.8 2.5 - 7.1 mg/dL   BUN 12 8 - 27 mg/dL   Creatinine, Ser 1.03 (H) 0.57 - 1.00 mg/dL   GFR calc non Af Amer 55 (L) >59 mL/min/1.73   GFR calc Af Amer 64 >59 mL/min/1.73   BUN/Creatinine Ratio 12 12 - 28   Sodium 142 134 - 144 mmol/L   Potassium 4.2 3.5 - 5.2 mmol/L   Chloride 104 96 - 106 mmol/L   Calcium 9.5 8.7 - 10.3 mg/dL   Phosphorus 3.3 2.5 - 4.5 mg/dL   Total Protein 6.2 6.0 - 8.5 g/dL   Albumin 4.2 3.5 - 4.8 g/dL   Globulin, Total 2.0 1.5 - 4.5 g/dL   Albumin/Globulin Ratio 2.1 1.2 - 2.2   Bilirubin Total 0.4 0.0 - 1.2 mg/dL   Alkaline Phosphatase 88 39 - 117 IU/L   LDH 176 119 - 226 IU/L   AST 10 0 - 40 IU/L   ALT 15 0 - 32 IU/L   GGT 18 0 - 60 IU/L   Iron 78 27 - 139 ug/dL   Cholesterol, Total 192 100 - 199 mg/dL   Triglycerides 102 0 - 149 mg/dL   HDL 43 >39 mg/dL   VLDL Cholesterol Cal 20 5 - 40 mg/dL   LDL Calculated 129 (H) 0 - 99 mg/dL   Chol/HDL Ratio 4.5 (H) 0.0 - 4.4 ratio units   Estimated CHD Risk 1.1 (H) 0.0 - 1.0  times avg.   TSH 1.480 0.450 - 4.500 uIU/mL   T4, Total 7.4 4.5 - 12.0 ug/dL   T3 Uptake Ratio 33 24 - 39 %   Free Thyroxine Index 2.4 1.2 - 4.9   WBC 5.0 3.4 - 10.8 x10E3/uL   RBC 4.21 3.77 - 5.28 x10E6/uL   Hemoglobin  12.9 11.1 - 15.9 g/dL   Hematocrit 39.2 34.0 - 46.6 %   MCV 93 79 - 97 fL   MCH 30.6 26.6 - 33.0 pg   MCHC 32.9 31.5 - 35.7 g/dL  RDW 14.1 12.3 - 15.4 %   Platelets 172 150 - 379 x10E3/uL   Neutrophils 54 Not Estab. %   Lymphs 36 Not Estab. %   Monocytes 8 Not Estab. %   Eos 2 Not Estab. %   Basos 0 Not Estab. %   Neutrophils Absolute 2.7 1.4 - 7.0 x10E3/uL   Lymphocytes Absolute 1.8 0.7 - 3.1 x10E3/uL   Monocytes Absolute 0.4 0.1 - 0.9 x10E3/uL   EOS (ABSOLUTE) 0.1 0.0 - 0.4 x10E3/uL   Basophils Absolute 0.0 0.0 - 0.2 x10E3/uL   Immature Granulocytes 0 Not Estab. %   Immature Grans (Abs) 0.0 0.0 - 0.1 x10E3/uL  Vitamin D (25 hydroxy)  Result Value Ref Range   Vit D, 25-Hydroxy 13.2 (L) 30.0 - 100.0 ng/mL  Hepatitis C antibody (reflex)  Result Value Ref Range   HCV Ab <0.1 0.0 - 0.9 s/co ratio  Urinalysis  Result Value Ref Range   Specific Gravity, UA 1.006 1.005 - 1.030   pH, UA 5.5 5.0 - 7.5   Color, UA Yellow Yellow   Appearance Ur Clear Clear   Leukocytes, UA Negative Negative   Protein, UA Negative Negative/Trace   Glucose, UA Negative Negative   Ketones, UA Negative Negative   RBC, UA Negative Negative   Bilirubin, UA Negative Negative   Urobilinogen, Ur 0.2 0.2 - 1.0 mg/dL   Nitrite, UA Negative Negative  HCV Comment:  Result Value Ref Range   Comment: Comment       Assessment & Plan:   Problem List Items Addressed This Visit      Other   Generalized anxiety disorder - Primary    Not under great control. Discussed starting preventative medicine as she is taking xanax daily- she is not interested. Information provided about different medications. Discussed dangers of benzodiazapines in the elderly including increasing the risk of death, dementia and falls. She is aware.       Relevant Medications   ALPRAZolam (XANAX) 0.5 MG tablet   Hyperlipidemia    [place] Rx given. Await results and treat as needed.       Relevant Orders    Comprehensive metabolic panel   Lipid Panel w/o Chol/HDL Ratio   Vitamin D deficiency    [place]. Rx given. Await results and treat as needed.       Relevant Orders   VITAMIN D 25 Hydroxy (Vit-D Deficiency, Fractures)       Follow up plan: Return in about 3 months (around 04/07/2017) for follow up mood.

## 2017-01-05 NOTE — Assessment & Plan Note (Signed)
[place] Rx given. Await results and treat as needed.

## 2017-01-05 NOTE — Patient Instructions (Addendum)
Escitalopram tablets What is this medicine? ESCITALOPRAM (es sye TAL oh pram) is used to treat depression and certain types of anxiety. This medicine may be used for other purposes; ask your health care provider or pharmacist if you have questions. COMMON BRAND NAME(S): Lexapro What should I tell my health care provider before I take this medicine? They need to know if you have any of these conditions: -bipolar disorder or a family history of bipolar disorder -diabetes -glaucoma -heart disease -kidney or liver disease -receiving electroconvulsive therapy -seizures (convulsions) -suicidal thoughts, plans, or attempt by you or a family member -an unusual or allergic reaction to escitalopram, the related drug citalopram, other medicines, foods, dyes, or preservatives -pregnant or trying to become pregnant -breast-feeding How should I use this medicine? Take this medicine by mouth with a glass of water. Follow the directions on the prescription label. You can take it with or without food. If it upsets your stomach, take it with food. Take your medicine at regular intervals. Do not take it more often than directed. Do not stop taking this medicine suddenly except upon the advice of your doctor. Stopping this medicine too quickly may cause serious side effects or your condition may worsen. A special MedGuide will be given to you by the pharmacist with each prescription and refill. Be sure to read this information carefully each time. Talk to your pediatrician regarding the use of this medicine in children. Special care may be needed. Overdosage: If you think you have taken too much of this medicine contact a poison control center or emergency room at once. NOTE: This medicine is only for you. Do not share this medicine with others. What if I miss a dose? If you miss a dose, take it as soon as you can. If it is almost time for your next dose, take only that dose. Do not take double or extra  doses. What may interact with this medicine? Do not take this medicine with any of the following medications: -certain medicines for fungal infections like fluconazole, itraconazole, ketoconazole, posaconazole, voriconazole -cisapride -citalopram -dofetilide -dronedarone -linezolid -MAOIs like Carbex, Eldepryl, Marplan, Nardil, and Parnate -methylene blue (injected into a vein) -pimozide -thioridazine -ziprasidone This medicine may also interact with the following medications: -alcohol -amphetamines -aspirin and aspirin-like medicines -carbamazepine -certain medicines for depression, anxiety, or psychotic disturbances -certain medicines for migraine headache like almotriptan, eletriptan, frovatriptan, naratriptan, rizatriptan, sumatriptan, zolmitriptan -certain medicines for sleep -certain medicines that treat or prevent blood clots like warfarin, enoxaparin, dalteparin -cimetidine -diuretics -fentanyl -furazolidone -isoniazid -lithium -metoprolol -NSAIDs, medicines for pain and inflammation, like ibuprofen or naproxen -other medicines that prolong the QT interval (cause an abnormal heart rhythm) -procarbazine -rasagiline -supplements like St. John's wort, kava kava, valerian -tramadol -tryptophan This list may not describe all possible interactions. Give your health care provider a list of all the medicines, herbs, non-prescription drugs, or dietary supplements you use. Also tell them if you smoke, drink alcohol, or use illegal drugs. Some items may interact with your medicine. What should I watch for while using this medicine? Tell your doctor if your symptoms do not get better or if they get worse. Visit your doctor or health care professional for regular checks on your progress. Because it may take several weeks to see the full effects of this medicine, it is important to continue your treatment as prescribed by your doctor. Patients and their families should watch out for  new or worsening thoughts of suicide or depression. Also watch out for   sudden changes in feelings such as feeling anxious, agitated, panicky, irritable, hostile, aggressive, impulsive, severely restless, overly excited and hyperactive, or not being able to sleep. If this happens, especially at the beginning of treatment or after a change in dose, call your health care professional. Dennis Bast may get drowsy or dizzy. Do not drive, use machinery, or do anything that needs mental alertness until you know how this medicine affects you. Do not stand or sit up quickly, especially if you are an older patient. This reduces the risk of dizzy or fainting spells. Alcohol may interfere with the effect of this medicine. Avoid alcoholic drinks. Your mouth may get dry. Chewing sugarless gum or sucking hard candy, and drinking plenty of water may help. Contact your doctor if the problem does not go away or is severe. What side effects may I notice from receiving this medicine? Side effects that you should report to your doctor or health care professional as soon as possible: -allergic reactions like skin rash, itching or hives, swelling of the face, lips, or tongue -anxious -black, tarry stools -changes in vision -confusion -elevated mood, decreased need for sleep, racing thoughts, impulsive behavior -eye pain -fast, irregular heartbeat -feeling faint or lightheaded, falls -feeling agitated, angry, or irritable -hallucination, loss of contact with reality -loss of balance or coordination -loss of memory -painful or prolonged erections -restlessness, pacing, inability to keep still -seizures -stiff muscles -suicidal thoughts or other mood changes -trouble sleeping -unusual bleeding or bruising -unusually weak or tired -vomiting Side effects that usually do not require medical attention (report to your doctor or health care professional if they continue or are bothersome): -changes in appetite -change in sex  drive or performance -headache -increased sweating -indigestion, nausea -tremors This list may not describe all possible side effects. Call your doctor for medical advice about side effects. You may report side effects to FDA at 1-800-FDA-1088. Where should I keep my medicine? Keep out of reach of children. Store at room temperature between 15 and 30 degrees C (59 and 86 degrees F). Throw away any unused medicine after the expiration date. NOTE: This sheet is a summary. It may not cover all possible information. If you have questions about this medicine, talk to your doctor, pharmacist, or health care provider.  2018 Elsevier/Gold Standard (2015-08-09 13:20:23) Citalopram tablets What is this medicine? CITALOPRAM (sye TAL oh pram) is a medicine for depression. This medicine may be used for other purposes; ask your health care provider or pharmacist if you have questions. COMMON BRAND NAME(S): Celexa What should I tell my health care provider before I take this medicine? They need to know if you have any of these conditions: -bleeding disorders -bipolar disorder or a family history of bipolar disorder -glaucoma -heart disease -history of irregular heartbeat -kidney disease -liver disease -low levels of magnesium or potassium in the blood -receiving electroconvulsive therapy -seizures -suicidal thoughts, plans, or attempt; a previous suicide attempt by you or a family member -take medicines that treat or prevent blood clots -thyroid disease -an unusual or allergic reaction to citalopram, escitalopram, other medicines, foods, dyes, or preservatives -pregnant or trying to become pregnant -breast-feeding How should I use this medicine? Take this medicine by mouth with a glass of water. Follow the directions on the prescription label. You can take it with or without food. Take your medicine at regular intervals. Do not take your medicine more often than directed. Do not stop taking this  medicine suddenly except upon the advice of your doctor.  Stopping this medicine too quickly may cause serious side effects or your condition may worsen. A special MedGuide will be given to you by the pharmacist with each prescription and refill. Be sure to read this information carefully each time. Talk to your pediatrician regarding the use of this medicine in children. Special care may be needed. Patients over 10 years old may have a stronger reaction and need a smaller dose. Overdosage: If you think you have taken too much of this medicine contact a poison control center or emergency room at once. NOTE: This medicine is only for you. Do not share this medicine with others. What if I miss a dose? If you miss a dose, take it as soon as you can. If it is almost time for your next dose, take only that dose. Do not take double or extra doses. What may interact with this medicine? Do not take this medicine with any of the following medications: -certain medicines for fungal infections like fluconazole, itraconazole, ketoconazole, posaconazole, voriconazole -cisapride -dofetilide -dronedarone -escitalopram -linezolid -MAOIs like Carbex, Eldepryl, Marplan, Nardil, and Parnate -methylene blue (injected into a vein) -pimozide -thioridazine -ziprasidone This medicine may also interact with the following medications: -alcohol -amphetamines -aspirin and aspirin-like medicines -carbamazepine -certain medicines for depression, anxiety, or psychotic disturbances -certain medicines for infections like chloroquine, clarithromycin, erythromycin, furazolidone, isoniazid, pentamidine -certain medicines for migraine headaches like almotriptan, eletriptan, frovatriptan, naratriptan, rizatriptan, sumatriptan, zolmitriptan -certain medicines for sleep -certain medicines that treat or prevent blood clots like dalteparin, enoxaparin,  warfarin -cimetidine -diuretics -fentanyl -lithium -methadone -metoprolol -NSAIDs, medicines for pain and inflammation, like ibuprofen or naproxen -omeprazole -other medicines that prolong the QT interval (cause an abnormal heart rhythm) -procarbazine -rasagiline -supplements like St. John's wort, kava kava, valerian -tramadol -tryptophan This list may not describe all possible interactions. Give your health care provider a list of all the medicines, herbs, non-prescription drugs, or dietary supplements you use. Also tell them if you smoke, drink alcohol, or use illegal drugs. Some items may interact with your medicine. What should I watch for while using this medicine? Tell your doctor if your symptoms do not get better or if they get worse. Visit your doctor or health care professional for regular checks on your progress. Because it may take several weeks to see the full effects of this medicine, it is important to continue your treatment as prescribed by your doctor. Patients and their families should watch out for new or worsening thoughts of suicide or depression. Also watch out for sudden changes in feelings such as feeling anxious, agitated, panicky, irritable, hostile, aggressive, impulsive, severely restless, overly excited and hyperactive, or not being able to sleep. If this happens, especially at the beginning of treatment or after a change in dose, call your health care professional. Dennis Bast may get drowsy or dizzy. Do not drive, use machinery, or do anything that needs mental alertness until you know how this medicine affects you. Do not stand or sit up quickly, especially if you are an older patient. This reduces the risk of dizzy or fainting spells. Alcohol may interfere with the effect of this medicine. Avoid alcoholic drinks. Your mouth may get dry. Chewing sugarless gum or sucking hard candy, and drinking plenty of water will help. Contact your doctor if the problem does not go away  or is severe. What side effects may I notice from receiving this medicine? Side effects that you should report to your doctor or health care professional as soon as possible: -allergic reactions  like skin rash, itching or hives, swelling of the face, lips, or tongue -anxious -black, tarry stools -breathing problems -changes in vision -chest pain -confusion -elevated mood, decreased need for sleep, racing thoughts, impulsive behavior -eye pain -fast, irregular heartbeat -feeling faint or lightheaded, falls -feeling agitated, angry, or irritable -hallucination, loss of contact with reality -loss of balance or coordination -loss of memory -painful or prolonged erections -restlessness, pacing, inability to keep still -seizures -stiff muscles -suicidal thoughts or other mood changes -trouble sleeping -unusual bleeding or bruising -unusually weak or tired -vomiting Side effects that usually do not require medical attention (report to your doctor or health care professional if they continue or are bothersome): -change in appetite or weight -change in sex drive or performance -dizziness -headache -increased sweating -indigestion, nausea -tremors This list may not describe all possible side effects. Call your doctor for medical advice about side effects. You may report side effects to FDA at 1-800-FDA-1088. Where should I keep my medicine? Keep out of reach of children. Store at room temperature between 15 and 30 degrees C (59 and 86 degrees F). Throw away any unused medicine after the expiration date. NOTE: This sheet is a summary. It may not cover all possible information. If you have questions about this medicine, talk to your doctor, pharmacist, or health care provider.  2018 Elsevier/Gold Standard (2015-08-09 13:18:52)

## 2017-01-10 ENCOUNTER — Other Ambulatory Visit: Payer: Self-pay

## 2017-01-10 DIAGNOSIS — Z299 Encounter for prophylactic measures, unspecified: Secondary | ICD-10-CM

## 2017-01-10 NOTE — Progress Notes (Signed)
Patient came in to get her scheduled flu shot and get blood drawn for testing per Dr. Jinny Blossom Johnson's orders.

## 2017-01-11 LAB — COMPREHENSIVE METABOLIC PANEL
ALBUMIN: 4.4 g/dL (ref 3.5–4.8)
ALK PHOS: 95 IU/L (ref 39–117)
ALT: 12 IU/L (ref 0–32)
AST: 12 IU/L (ref 0–40)
Albumin/Globulin Ratio: 2 (ref 1.2–2.2)
BILIRUBIN TOTAL: 0.2 mg/dL (ref 0.0–1.2)
BUN / CREAT RATIO: 13 (ref 12–28)
BUN: 14 mg/dL (ref 8–27)
CHLORIDE: 103 mmol/L (ref 96–106)
CO2: 23 mmol/L (ref 20–29)
Calcium: 10 mg/dL (ref 8.7–10.3)
Creatinine, Ser: 1.06 mg/dL — ABNORMAL HIGH (ref 0.57–1.00)
GFR calc Af Amer: 61 mL/min/{1.73_m2} (ref >59)
GFR calc non Af Amer: 53 mL/min/{1.73_m2} — ABNORMAL LOW (ref >59)
GLUCOSE: 101 mg/dL — AB (ref 65–99)
Globulin, Total: 2.2 g/dL (ref 1.5–4.5)
Potassium: 4.4 mmol/L (ref 3.5–5.2)
Sodium: 140 mmol/L (ref 134–144)
Total Protein: 6.6 g/dL (ref 6.0–8.5)

## 2017-01-11 LAB — SPECIMEN STATUS

## 2017-01-11 LAB — LIPID PANEL
CHOL/HDL RATIO: 4.6 ratio — AB (ref 0.0–4.4)
Cholesterol, Total: 221 mg/dL — ABNORMAL HIGH (ref 100–199)
HDL: 48 mg/dL (ref >39)
LDL Calculated: 150 mg/dL — ABNORMAL HIGH (ref 0–99)
Triglycerides: 114 mg/dL (ref 0–149)
VLDL CHOLESTEROL CAL: 23 mg/dL (ref 5–40)

## 2017-01-11 LAB — VITAMIN D 25 HYDROXY (VIT D DEFICIENCY, FRACTURES): VIT D 25 HYDROXY: 33.9 ng/mL (ref 30.0–100.0)

## 2017-01-12 ENCOUNTER — Encounter: Payer: Self-pay | Admitting: Family Medicine

## 2017-04-13 ENCOUNTER — Telehealth: Payer: Self-pay | Admitting: Family Medicine

## 2017-04-13 DIAGNOSIS — F411 Generalized anxiety disorder: Secondary | ICD-10-CM

## 2017-04-13 MED ORDER — ALPRAZOLAM 0.5 MG PO TABS: ORAL_TABLET | ORAL | 0 refills | Status: DC

## 2017-04-13 NOTE — Telephone Encounter (Signed)
Copied from [place]. Topic: Quick Communication - Rx Refill/Question >> Apr 13, 2017  9:31 AM Wynetta Emery, [place] C wrote:   Medication: ALPRAZolam - 3 month supply.    Has the patient contacted their pharmacy? No  Pt says that she will come in and pick up a Rx (hard copy) if it easier for provider. Please assist further   CB: [phone]         Agent: Please be advised that RX refills may take up to 3 business days. We ask that you follow-up with your pharmacy.

## 2017-04-13 NOTE — Telephone Encounter (Signed)
As discussed at her last appointment, will need to be seen every 3 months for xanax Rx. Needs appointment.

## 2017-04-13 NOTE — Telephone Encounter (Signed)
Please call in xanax. Thanks!

## 2017-04-13 NOTE — Telephone Encounter (Signed)
Patient thought she had a CPE appt scheduled but it had not been put on the schedule for March. She scheduled it for 3/19 @ 9.  She would like to know if she will still need the f/u appt in order to get her medication refill.  Please advise. thanks

## 2017-04-13 NOTE — Telephone Encounter (Signed)
RX called into pharmacy. Called and left patient a VM letting her know this was done for her.

## 2017-04-13 NOTE — Telephone Encounter (Signed)
Needs a follow up appointment.

## 2017-04-13 NOTE — Telephone Encounter (Signed)
Patient scheduled for tues 1/29 @ 9 but she is hoping you will give her an emergency 2-3 since she only has 2 pills left.  Please advise.   Thanks

## 2017-04-17 ENCOUNTER — Ambulatory Visit: Payer: Managed Care, Other (non HMO) | Admitting: Family Medicine

## 2017-04-19 ENCOUNTER — Encounter: Payer: Self-pay | Admitting: Family Medicine

## 2017-04-19 ENCOUNTER — Ambulatory Visit: Payer: Managed Care, Other (non HMO) | Admitting: Family Medicine

## 2017-04-19 DIAGNOSIS — F411 Generalized anxiety disorder: Secondary | ICD-10-CM

## 2017-04-19 MED ORDER — ALPRAZOLAM 0.5 MG PO TABS: ORAL_TABLET | ORAL | 2 refills | Status: DC

## 2017-04-19 NOTE — Assessment & Plan Note (Signed)
Exacerbated. Again discussed starting preventative medicine. She is not interested at this time. Discussed again the dangers of benzodiazepines in the elderly including the risk of death, dementia and falls. She is aware. Will refill her xanax. This Rx should last at least 3 months. Follow up 3 months.

## 2017-04-19 NOTE — Progress Notes (Signed)
BP 118/60 (BP Location: Left Arm, Cuff Size: Normal)   Pulse 80   Temp 97.6 F (36.4 C) (Oral)   Wt 144 lb 4 oz (65.4 kg)   SpO2 97%   BMI 25.55 kg/m    Subjective:    Patient ID: Maureen [name], female    DOB: 1945-10-09, 72 y.o.   MRN: [id number]  HPI: Maureen [name] is a 72 y.o. female  Chief Complaint  Patient presents with  . Anxiety   ANXIETY/STRESS- has not been doing well. She notes that "November and December were hell". She notes that she and her daughter have been arguing. Her friend is dealing with breast cancer and she is upset about that. She missed her appointment earlier in the week and she was upset about that. Needed to take a little bit more of her medicine during that time period.  Duration:exacerbated Anxious mood: yes  Excessive worrying: yes Irritability: no  Sweating: no Nausea: no Palpitations:no Hyperventilation: no Panic attacks: yes- having them a bit more than 1x a week during December and November- starting to level out a bit Agoraphobia: no  Obscessions/compulsions: no Depressed mood: no Depression screen [facility] 2/9 04/19/2017 01/05/2017 08/25/2016 05/25/2016 05/23/2016  Decreased Interest 0 0 0 0 0  Down, Depressed, Hopeless 0 0 1 0 0  PHQ - 2 Score 0 0 1 0 0  Altered sleeping 0 - 0 - -  Tired, decreased energy 0 - 0 - -  Change in appetite 0 - 0 - -  Feeling bad or failure about yourself  0 - 0 - -  Trouble concentrating 0 - 0 - -  Moving slowly or fidgety/restless 0 - 0 - -  Suicidal thoughts 0 - 0 - -  PHQ-9 Score 0 - 1 - -   GAD 7 : Generalized Anxiety Score 04/19/2017 01/05/2017 08/25/2016  Nervous, Anxious, on Edge _0 Control/stop worrying 0 3 0  Worry too much - different things 1 2 0  Trouble relaxing 0 1 1  Restless 0 1 1  Easily annoyed or irritable 0 1 0  Afraid - awful might happen 0 0 0  Total GAD 7 Score _1 Anxiety Difficulty - Somewhat difficult Not difficult at all   Anhedonia: no Weight changes: no Insomnia:  no   Hypersomnia: no Fatigue/loss of energy: yes Feelings of worthlessness: no Feelings of guilt: no Impaired concentration/indecisiveness: no Suicidal ideations: no  Crying spells: no Recent Stressors/Life Changes: yes   Relationship problems: no   Family stress: yes     Financial stress: no    Job stress: yes    Recent death/loss:yes   Relevant past medical, surgical, family and social history reviewed and updated as indicated. Interim medical history since our last visit reviewed. Allergies and medications reviewed and updated.  Review of Systems  Constitutional: Negative.   Respiratory: Negative.   Cardiovascular: Negative.   Psychiatric/Behavioral: Positive for agitation, dysphoric mood and sleep disturbance. Negative for behavioral problems, confusion, decreased concentration, hallucinations, self-injury and suicidal ideas. The patient is nervous/anxious. The patient is not hyperactive.     Per HPI unless specifically indicated above     Objective:    BP 118/60 (BP Location: Left Arm, Cuff Size: Normal)   Pulse 80   Temp 97.6 F (36.4 C) (Oral)   Wt 144 lb 4 oz (65.4 kg)   SpO2 97%   BMI 25.55 kg/m   Wt Readings from Last 3 Encounters:  04/19/17 144 lb 4 oz (65.4 kg)  01/05/17 147 lb 2 oz (66.7 kg)  08/25/16 144 lb (65.3 kg)    Physical Exam  Constitutional: She is oriented to person, place, and time. She appears well-developed and well-nourished. No distress.  HENT:  Head: Normocephalic and atraumatic.  Right Ear: Hearing normal.  Left Ear: Hearing normal.  Nose: Nose normal.  Eyes: Conjunctivae and lids are normal. Right eye exhibits no discharge. Left eye exhibits no discharge. No scleral icterus.  Cardiovascular: Normal rate, regular rhythm, normal heart sounds and intact distal pulses. Exam reveals no gallop and no friction rub.  No murmur heard. Pulmonary/Chest: Effort normal and breath sounds normal. No respiratory distress. She has no wheezes. She  has no rales. She exhibits no tenderness.  Musculoskeletal: Normal range of motion.  Neurological: She is alert and oriented to person, place, and time.  Skin: Skin is warm, dry and intact. No rash noted. She is not diaphoretic. No erythema. No pallor.  Psychiatric: Her speech is normal and behavior is normal. Judgment and thought content normal. Her mood appears anxious. Cognition and memory are normal.  Nursing note and vitals reviewed.   Results for orders placed or performed in visit on 01/10/17  Lipid Profile  Result Value Ref Range   Cholesterol, Total 221 (H) 100 - 199 mg/dL   Triglycerides 114 0 - 149 mg/dL   HDL 48 >39 mg/dL   VLDL Cholesterol Cal 23 5 - 40 mg/dL   LDL Calculated 150 (H) 0 - 99 mg/dL   Chol/HDL Ratio 4.6 (H) 0.0 - 4.4 ratio  Comp Met (CMET)  Result Value Ref Range   Glucose 101 (H) 65 - 99 mg/dL   BUN 14 8 - 27 mg/dL   Creatinine, Ser 1.06 (H) 0.57 - 1.00 mg/dL   GFR calc non Af Amer 53 (L) >59 mL/min/1.73   GFR calc Af Amer 61 >59 mL/min/1.73   BUN/Creatinine Ratio 13 12 - 28   Sodium 140 134 - 144 mmol/L   Potassium 4.4 3.5 - 5.2 mmol/L   Chloride 103 96 - 106 mmol/L   CO2 23 20 - 29 mmol/L   Calcium 10.0 8.7 - 10.3 mg/dL   Total Protein 6.6 6.0 - 8.5 g/dL   Albumin 4.4 3.5 - 4.8 g/dL   Globulin, Total 2.2 1.5 - 4.5 g/dL   Albumin/Globulin Ratio 2.0 1.2 - 2.2   Bilirubin Total 0.2 0.0 - 1.2 mg/dL   Alkaline Phosphatase 95 39 - 117 IU/L   AST 12 0 - 40 IU/L   ALT 12 0 - 32 IU/L  Vitamin D (25 hydroxy)  Result Value Ref Range   Vit D, 25-Hydroxy 33.9 30.0 - 100.0 ng/mL  Specimen Status  Result Value Ref Range   WBC WILL FOLLOW    RBC WILL FOLLOW    Hemoglobin WILL FOLLOW    Hematocrit WILL FOLLOW    MCV WILL FOLLOW    MCH WILL FOLLOW    MCHC WILL FOLLOW    RDW WILL FOLLOW    Platelets WILL FOLLOW    Neutrophils WILL FOLLOW    Lymphs WILL FOLLOW    Monocytes WILL FOLLOW    Eos WILL FOLLOW    Basos WILL FOLLOW    Neutrophils Absolute  WILL FOLLOW    Lymphocytes Absolute WILL FOLLOW    Monocytes Absolute WILL FOLLOW    EOS (ABSOLUTE) WILL FOLLOW    Basophils Absolute WILL FOLLOW    Immature Granulocytes WILL FOLLOW  Immature Grans (Abs) WILL FOLLOW       Assessment & Plan:   Problem List Items Addressed This Visit      Other   Generalized anxiety disorder    Exacerbated. Again discussed starting preventative medicine. She is not interested at this time. Discussed again the dangers of benzodiazepines in the elderly including the risk of death, dementia and falls. She is aware. Will refill her xanax. This Rx should last at least 3 months. Follow up 3 months.       Relevant Medications   ALPRAZolam (XANAX) 0.5 MG tablet       Follow up plan: Return in about 3 months (around 07/17/2017) for physical/follow up .

## 2017-05-24 ENCOUNTER — Encounter: Payer: Self-pay | Admitting: Family Medicine

## 2017-05-24 ENCOUNTER — Ambulatory Visit: Payer: Self-pay | Admitting: Family Medicine

## 2017-05-24 VITALS — BP 118/76 | HR 81 | Temp 97.7°F | Resp 18

## 2017-05-24 DIAGNOSIS — J069 Acute upper respiratory infection, unspecified: Secondary | ICD-10-CM

## 2017-05-24 NOTE — Progress Notes (Signed)
Subjective: runny nose     Maureen [name] is a 72 y.o. female who presents for evaluation of runny nose with clear discharge, sore throat, generalized facial pressure, cough with a small amount of clear sputum.  Patient reports symptoms began a few hours ago, patient presented from work.  Patient denies a history of asthma or COPD.  Patient currently smokes less than a pack a day with a long history of smoking.  Patient reports recent pulmonary function testing  was negative for asthma, COPD, lung disease.  Patient uses Symbicort as needed when she has bronchitis.  No rescue inhaler use.  Patient reports she gets bronchitis 1-2 times a year during the seasonal changes in spring and fall.  Treatments attempted at home include Sinex nasal spray, nasal saline spray, salt water gargle.  Patient reports multiple coworkers that she has close contact with have upper respiratory infections.  Patient reports mild chills this morning. Denies rash, nausea, vomiting, diarrhea, shortness of breath, wheezing, chest or back pain, ear pain, difficulty swallowing, confusion, purulent nasal discharge, headache, body aches, fatigue, fever, or severe symptoms.  Patient has a history of breast cancer in the remote past but is not taking any medications or on any sort of immunosuppressive therapy for this.  Review of Systems Pertinent items noted in HPI and remainder of comprehensive ROS otherwise negative.     Objective:   Physical Exam General: Awake, alert, and oriented. No acute distress. Well developed, hydrated and nourished. Appears stated age.  Nontoxic appearance. HEENT: PND noted. No erythema, edema or exudates of pharynx or tonsils.   no erythema or bulging of TM.  Mild  erythema/edema to nasal mucosa.  Patient endorses bilateral tenderness over her maxillary and frontal sinuses in addition to many other areas of her face that are not overlying sinuses.  Patient unable to localize tenderness to her sinuses.   Supple neck without adenopathy. Cardiac: Heart rate and rhythm are normal. No murmurs, gallops, or rubs are auscultated. S1 and S2 are heard and are of normal intensity.  Respiratory: No signs of respiratory distress. Lungs clear. No tachypnea. Able to speak in full sentences without dyspnea.  Respirations nonlabored at 16/min witnessed by the provider. No cough noted during visit. Skin: Skin is warm, dry and intact. Appropriate color for ethnicity. No cyanosis noted.    Assessment:    viral upper respiratory illness   Plan:    Discussed diagnosis and treatment of URI. Discussed the importance of avoiding unnecessary antibiotic therapy. Suggested symptomatic OTC remedies. Nasal saline spray for congestion.   Patient insistent on receiving antibiotics today.  Informed patient about the adverse effects of antibiotic use, especially since she is allergic to many of the antibiotics with the more favorable side effect profiles.  Provided patient with in depth education regarding red flag symptoms and circumstances with which to to seek medical care.  Discussed other possible treatment options other than antibiotics but the patient was not interested.  New Prescriptions   No medications on file

## 2017-06-05 ENCOUNTER — Ambulatory Visit: Payer: Managed Care, Other (non HMO) | Admitting: Family Medicine

## 2017-06-05 ENCOUNTER — Encounter: Payer: Self-pay | Admitting: Family Medicine

## 2017-06-05 VITALS — BP 114/72 | HR 78 | Temp 98.6°F | Ht 63.2 in | Wt 142.1 lb

## 2017-06-05 DIAGNOSIS — Z853 Personal history of malignant neoplasm of breast: Secondary | ICD-10-CM

## 2017-06-05 DIAGNOSIS — F411 Generalized anxiety disorder: Secondary | ICD-10-CM | POA: Diagnosis not present

## 2017-06-05 DIAGNOSIS — K219 Gastro-esophageal reflux disease without esophagitis: Secondary | ICD-10-CM | POA: Diagnosis not present

## 2017-06-05 DIAGNOSIS — Z13820 Encounter for screening for osteoporosis: Secondary | ICD-10-CM

## 2017-06-05 DIAGNOSIS — E782 Mixed hyperlipidemia: Secondary | ICD-10-CM | POA: Diagnosis not present

## 2017-06-05 DIAGNOSIS — Z0000 Encounter for general adult medical examination without abnormal findings: Secondary | ICD-10-CM

## 2017-06-05 DIAGNOSIS — L930 Discoid lupus erythematosus: Secondary | ICD-10-CM

## 2017-06-05 DIAGNOSIS — E559 Vitamin D deficiency, unspecified: Secondary | ICD-10-CM | POA: Diagnosis not present

## 2017-06-05 DIAGNOSIS — Z0001 Encounter for general adult medical examination with abnormal findings: Secondary | ICD-10-CM

## 2017-06-05 DIAGNOSIS — Z720 Tobacco use: Secondary | ICD-10-CM | POA: Diagnosis not present

## 2017-06-05 MED ORDER — BUDESONIDE-FORMOTEROL FUMARATE 160-4.5 MCG/ACT IN AERO: 2.0000 | INHALATION_SPRAY | Freq: Two times a day (BID) | RESPIRATORY_TRACT | 6 refills | Status: DC

## 2017-06-05 MED ORDER — OMEPRAZOLE 20 MG PO CPDR: 20.0000 mg | DELAYED_RELEASE_CAPSULE | Freq: Every day | ORAL | 4 refills | Status: DC | PRN

## 2017-06-05 NOTE — Progress Notes (Signed)
BP 114/72 (BP Location: Right Arm, Patient Position: Sitting, Cuff Size: Normal)   Pulse 78   Temp 98.6 F (37 C) (Oral)   Ht 5' 3.2" (1.605 m)   Wt 142 lb 2 oz (64.5 kg)   SpO2 98%   BMI 25.02 kg/m    Subjective:    Patient ID: Maureen [name], female    DOB: Apr 05, 1945, 72 y.o.   MRN: [id number]  HPI: Maureen [name] is a 72 y.o. female presenting on 06/05/2017 for comprehensive medical examination. Current medical complaints include:  ANXIETY/STRESS- is planning on doing a trip in a few weeks Duration:controlled Anxious mood: yes  Excessive worrying: yes Irritability: no  Sweating: no Nausea: no Palpitations:yes Hyperventilation: no Panic attacks: no Agoraphobia: no  Obscessions/compulsions: no Depressed mood: yes Depression screen [facility] 2/9 06/05/2017 04/19/2017 01/05/2017 08/25/2016 05/25/2016  Decreased Interest 1 0 0 0 0  Down, Depressed, Hopeless 1 0 0 1 0  PHQ - 2 Score 2 0 0 1 0  Altered sleeping 1 0 - 0 -  Tired, decreased energy 1 0 - 0 -  Change in appetite 1 0 - 0 -  Feeling bad or failure about yourself  0 0 - 0 -  Trouble concentrating 0 0 - 0 -  Moving slowly or fidgety/restless 0 0 - 0 -  Suicidal thoughts 0 0 - 0 -  PHQ-9 Score 5 0 - 1 -   GAD 7 : Generalized Anxiety Score 06/05/2017 04/19/2017 01/05/2017 08/25/2016  Nervous, Anxious, on Edge _0 Control/stop worrying 1 0 3 0  Worry too much - different things - 1 2 0  Trouble relaxing 0 0 1 1  Restless 1 0 1 1  Easily annoyed or irritable 0 0 1 0  Afraid - awful might happen 1 0 0 0  Total GAD 7 Score - _1 Anxiety Difficulty Not difficult at all - Somewhat difficult Not difficult at all   Anhedonia: no Weight changes: no Insomnia: no   Hypersomnia: no Fatigue/loss of energy: yes Feelings of worthlessness: no Feelings of guilt: no Impaired concentration/indecisiveness: no Suicidal ideations: no  Crying spells: no Recent Stressors/Life Changes: yes   Relationship problems: no  Family stress: no     Financial stress: no    Job stress: no    Recent death/loss: yes  Acid reflux is doing well. No concerns. Taking her omeprazole maybe a couple of times a week. Doing well. Continues to follow with Dr. Bary Castilla, having occasional biopsies. Doing well with that. Not interested in quitting smoking.   She currently lives with: husband Menopausal Symptoms: no  Depression Screen done today and results listed below:  Depression screen [facility] 2/9 06/05/2017 04/19/2017 01/05/2017 08/25/2016 05/25/2016  Decreased Interest 1 0 0 0 0  Down, Depressed, Hopeless 1 0 0 1 0  PHQ - 2 Score 2 0 0 1 0  Altered sleeping 1 0 - 0 -  Tired, decreased energy 1 0 - 0 -  Change in appetite 1 0 - 0 -  Feeling bad or failure about yourself  0 0 - 0 -  Trouble concentrating 0 0 - 0 -  Moving slowly or fidgety/restless 0 0 - 0 -  Suicidal thoughts 0 0 - 0 -  PHQ-9 Score 5 0 - 1 -    Past Medical History:  Past Medical History:  Diagnosis Date  . Arthritis 2005  . 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 This was a histologic grade 2 tumor measuring just under 1 cm in diameter. No invasive cancer was identified. ER 90%, PR 40%.  . 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  . Discoid lupus 2005  . Heart murmur 2005  . Hypercholesterolemia 2012  . Mammographic microcalcification 2011  . Panic attack   . Personal history of colonic polyps    Colon polyps  . Personal history of tobacco use, presenting hazards to health   . PUD (peptic ulcer disease)    Last EGD 1/09  . Rectal polyp 2008   villous adenoma; last colonoscopy 1/09 WNL  . Skin cancer, basal cell 2015   right cheek Mohs surgery at [facility]  . Ulcer     Surgical History:  Past Surgical History:  Procedure Laterality Date  . APPENDECTOMY  2011  . BREAST  BIOPSY  12/27/2009   right breast  . CARDIAC CATHETERIZATION  1996   Negative Cath  . COLONOSCOPY N/A 08/05/2014   Procedure: COLONOSCOPY;  Surgeon: Jeffrey W Byrnett, MD;  Location: [facility] ENDOSCOPY;  EGD, tubular adenoma 1.  . COLONOSCOPY W/ BIOPSIES  2005,2009   Dr. Byrnett. Normal exam 2009. Villous adenoma of the rectum in 2005 adenomatous polyp from the ascending colon and rectum in 2002.  . ESOPHAGOGASTRODUODENOSCOPY N/A 08/05/2014   Procedure: ESOPHAGOGASTRODUODENOSCOPY (EGD);  Surgeon: Jeffrey W Byrnett, MD;  Location: [facility] ENDOSCOPY;  Service: Endoscopy;  Laterality: N/A;  . MASTECTOMY     bilateral  . SIMPLE MASTECTOMY  1993   right  . SIMPLE MASTECTOMY  2011   left  . TONSILLECTOMY  1997  . UPPER GI ENDOSCOPY  2003, 2005, 2009, 2012, 2014   2012 showed mild reflux changes without evidence of Barrett's epithelial changes 2014 biopsies at 37 cm showed changes suggestive of reflux esophagitis. No metaplasia.  . VAGINAL HYSTERECTOMY  1977    Medications:  Current Outpatient Medications on File Prior to Visit  Medication Sig  . acetaminophen (TYLENOL) 500 MG tablet Take 500 mg by mouth every 6 (six) hours as needed for headache.  . ALPRAZolam (XANAX) 0.5 MG tablet TAKE 1/2 TO 1 TABLET BY MOUTH 2 TIMES A DAY AS NEEDED FOR ANXIETY  . clobetasol cream (TEMOVATE) 0.05 % Apply 1 application topically daily.  . Homeopathic Products (ZICAM ALLERGY RELIEF NA) Place 2 sprays into the nose daily as needed (allergies).   . RA KRILL OIL 500 MG CAPS Take 500 mg by mouth daily.  . Vitamin D, Ergocalciferol, (DRISDOL) 50000 units CAPS capsule Take 1 capsule (50,000 Units total) by mouth every 7 (seven) days.  . zinc gluconate 50 MG tablet Take 50 mg by mouth every other day.   No current facility-administered medications on file prior to visit.     Allergies:  Allergies  Allergen Reactions  . Codeine Itching  . Sulfa Antibiotics Hives  . Sulfinpyrazone   . Erythromycin Rash  .  Penicillins Rash    Social History:  Social History   Socioeconomic History  . Marital status: Widowed    Spouse name: Not on file  . Number of children: Not on file  . Years of education: Not on file  . Highest education level: Not on file  Social Needs  . Financial resource strain: Not on file  . Food insecurity - worry: Not on file  . Food insecurity -   inability: Not on file  . Transportation needs - medical: Not on file  . Transportation needs - non-medical: Not on file  Occupational History  . Occupation: Warrant Clerk    Employer: [employer]    Comment: [employer]  Tobacco Use  . Smoking status: Current Every Day Smoker    Packs/day: 1.00    Years: 40.00    Pack years: 40.00    Types: Cigarettes  . Smokeless tobacco: Never Used  . Tobacco comment: Pt wants to do hypnotizing  Substance and Sexual Activity  . Alcohol use: Yes    Alcohol/week: 0.6 - 1.2 oz    Types: 1 - 2 Standard drinks or equivalent per week  . Drug use: No  . Sexual activity: Not Currently  Other Topics Concern  . Not on file  Social History Narrative   Abiha grew up in [place]. She has two adult daughters. She lives in her home. Her youngest daughter and grandchildren live with her. She has two dogs. 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 She loves watching her grandson play basketball. She also loves gardening and being outdoors and working in the yard.    Social History   Tobacco Use  Smoking Status Current Every Day Smoker  . Packs/day: 1.00  . Years: 40.00  . Pack years: 40.00  . Types: Cigarettes  Smokeless Tobacco Never Used  Tobacco Comment   Pt wants to do hypnotizing   Social History   Substance and Sexual Activity  Alcohol Use Yes  . Alcohol/week: 0.6 - 1.2 oz  . Types: 1 - 2 Standard drinks or equivalent per week    Family History:  Family History  Problem Relation Age of Onset  . Cancer Father         esophagus  . Cancer Maternal Aunt        breast cancer  . Cancer Maternal Grandmother        breast cancer    Past medical history, surgical history, medications, allergies, family history and social history reviewed with patient today and changes made to appropriate areas of the chart.   Review of Systems  Constitutional: Negative.   HENT: Negative.   Eyes: Negative.   Respiratory: Positive for cough. Negative for hemoptysis, sputum production, shortness of breath and wheezing.   Cardiovascular: Negative.   Gastrointestinal: Negative.   Genitourinary: Negative.   Musculoskeletal: Positive for myalgias. Negative for back pain, falls, joint pain and neck pain.  Skin: Negative.   Neurological: Negative.   Endo/Heme/Allergies: Positive for environmental allergies. Negative for polydipsia. Does not bruise/bleed easily.  Psychiatric/Behavioral: Negative for depression, hallucinations, memory loss, substance abuse and suicidal ideas. The patient is nervous/anxious. The patient does not have insomnia.     All other ROS negative except what is listed above and in the HPI.      Objective:    BP 114/72 (BP Location: Right Arm, Patient Position: Sitting, Cuff Size: Normal)   Pulse 78   Temp 98.6 F (37 C) (Oral)   Ht 5' 3.2" (1.605 m)   Wt 142 lb 2 oz (64.5 kg)   SpO2 98%   BMI 25.02 kg/m   Wt Readings from Last 3 Encounters:  06/05/17 142 lb 2 oz (64.5 kg)  04/19/17 144 lb 4 oz (65.4 kg)  01/05/17 147 lb 2 oz (66.7 kg)    Physical Exam  Constitutional: She is oriented to person, place, and time. She appears well-developed   and well-nourished. No distress.  HENT:  Head: Normocephalic and atraumatic.  Right Ear: Hearing and external ear normal.  Left Ear: Hearing, tympanic membrane, external ear and ear canal normal.  Nose: Nose normal.  Mouth/Throat: Uvula is midline, oropharynx is clear and moist and mucous membranes are normal. No oropharyngeal exudate.  Cerumen  impaction R ear  Eyes: Conjunctivae, EOM and lids are normal. Pupils are equal, round, and reactive to light. Right eye exhibits no discharge. Left eye exhibits no discharge. No scleral icterus.  Neck: Normal range of motion. Neck supple. No JVD present. No tracheal deviation present. No thyromegaly present.  Cardiovascular: Normal rate, regular rhythm, normal heart sounds and intact distal pulses. Exam reveals no gallop and no friction rub.  No murmur heard. Pulmonary/Chest: Effort normal and breath sounds normal. No stridor. No respiratory distress. She has no wheezes. She has no rales. She exhibits no tenderness.  Breast exam deferred- done by Dr. Bary Castilla  Abdominal: Soft. Bowel sounds are normal. She exhibits no distension and no mass. There is no tenderness. There is no rebound and no guarding.  Genitourinary:  Genitourinary Comments: Pelvic exam deferred with shared decision making  Musculoskeletal: Normal range of motion. She exhibits no edema, tenderness or deformity.  Lymphadenopathy:    She has no cervical adenopathy.  Neurological: She is alert and oriented to person, place, and time. She has normal reflexes. She displays normal reflexes. No cranial nerve deficit. She exhibits normal muscle tone. Coordination normal.  Skin: Skin is warm, dry and intact. No rash noted. She is not diaphoretic. No erythema. No pallor.  Psychiatric: She has a normal mood and affect. Her speech is normal and behavior is normal. Judgment and thought content normal. Cognition and memory are normal.  Nursing note and vitals reviewed.   Results for orders placed or performed in visit on 01/10/17  Lipid Profile  Result Value Ref Range   Cholesterol, Total 221 (H) 100 - 199 mg/dL   Triglycerides 114 0 - 149 mg/dL   HDL 48 >39 mg/dL   VLDL Cholesterol Cal 23 5 - 40 mg/dL   LDL Calculated 150 (H) 0 - 99 mg/dL   Chol/HDL Ratio 4.6 (H) 0.0 - 4.4 ratio  Comp Met (CMET)  Result Value Ref Range   Glucose 101  (H) 65 - 99 mg/dL   BUN 14 8 - 27 mg/dL   Creatinine, Ser 1.06 (H) 0.57 - 1.00 mg/dL   GFR calc non Af Amer 53 (L) >59 mL/min/1.73   GFR calc Af Amer 61 >59 mL/min/1.73   BUN/Creatinine Ratio 13 12 - 28   Sodium 140 134 - 144 mmol/L   Potassium 4.4 3.5 - 5.2 mmol/L   Chloride 103 96 - 106 mmol/L   CO2 23 20 - 29 mmol/L   Calcium 10.0 8.7 - 10.3 mg/dL   Total Protein 6.6 6.0 - 8.5 g/dL   Albumin 4.4 3.5 - 4.8 g/dL   Globulin, Total 2.2 1.5 - 4.5 g/dL   Albumin/Globulin Ratio 2.0 1.2 - 2.2   Bilirubin Total 0.2 0.0 - 1.2 mg/dL   Alkaline Phosphatase 95 39 - 117 IU/L   AST 12 0 - 40 IU/L   ALT 12 0 - 32 IU/L  Vitamin D (25 hydroxy)  Result Value Ref Range   Vit D, 25-Hydroxy 33.9 30.0 - 100.0 ng/mL  Specimen Status  Result Value Ref Range   WBC WILL FOLLOW    RBC WILL FOLLOW    Hemoglobin WILL FOLLOW  Hematocrit WILL FOLLOW    MCV WILL FOLLOW    MCH WILL FOLLOW    MCHC WILL FOLLOW    RDW WILL FOLLOW    Platelets WILL FOLLOW    Neutrophils WILL FOLLOW    Lymphs WILL FOLLOW    Monocytes WILL FOLLOW    Eos WILL FOLLOW    Basos WILL FOLLOW    Neutrophils Absolute WILL FOLLOW    Lymphocytes Absolute WILL FOLLOW    Monocytes Absolute WILL FOLLOW    EOS (ABSOLUTE) WILL FOLLOW    Basophils Absolute WILL FOLLOW    Immature Granulocytes WILL FOLLOW    Immature Grans (Abs) WILL FOLLOW       Assessment & Plan:   Problem List Items Addressed This Visit      Digestive   Esophageal reflux    Under good control on current regimen. Refill given today. Continue to monitor. Call with any concerns.       Relevant Medications   omeprazole (PRILOSEC) 20 MG capsule   Other Relevant Orders   CBC with Differential/Platelet   Comprehensive metabolic panel     Other   History of breast cancer    Continuing to follow with Dr. Byrnett. Call with any concerns. No reoccurrence.       Relevant Orders   Comprehensive metabolic panel   Lupus    Stable. Continue to monitor. Call  with any concerns.       Relevant Orders   CBC with Differential/Platelet   Comprehensive metabolic panel   Generalized anxiety disorder    Has 2 refills left on her xanax. Using appropriately. Continue current regimen. Continue to monitor. OK to reschedule 3 month follow up until after her trip.      Relevant Orders   Comprehensive metabolic panel   TSH   Hyperlipidemia    Rechecking levels today. Await results. Call with any concerns.       Relevant Orders   Comprehensive metabolic panel   Lipid Panel w/o Chol/HDL Ratio   Tobacco abuse    Not interested in quitting right now. She knows we're here if she would like to quit and needs help.      Relevant Orders   Comprehensive metabolic panel   UA/M w/rflx Culture, Routine   Vitamin D deficiency    Rechecking levels today. Await results. Treat as needed.       Relevant Orders   Comprehensive metabolic panel   VITAMIN D 25 Hydroxy (Vit-D Deficiency, Fractures)    Other Visit Diagnoses    Routine general medical examination at a health care facility    -  Primary   Vaccines up to date. [place] at county. Pap, and mammo N/A. Colonoscopy up to date. DEXA ordered. Call with any concerns.    Relevant Orders   CBC with Differential/Platelet   Comprehensive metabolic panel   Lipid Panel w/o Chol/HDL Ratio   TSH   UA/M w/rflx Culture, Routine   Screening for osteoporosis       Relevant Orders   Comprehensive metabolic panel   DG Bone Density       Follow up plan: Return OK to reschedule for when she's back from her trip.   LABORATORY TESTING:  - Pap smear: not applicable  IMMUNIZATIONS:   - Tdap: Tetanus vaccination status reviewed: last tetanus booster within 10 years. - Influenza: Up to date - Pneumovax: Up to date - Prevnar: Up to date  SCREENING: -Mammogram: Not applicable  - Colonoscopy: Up   to date  - Bone Density: Up to date   PATIENT COUNSELING:   Advised to take 1 mg of folate  supplement per day if capable of pregnancy.   Sexuality: Discussed sexually transmitted diseases, partner selection, use of condoms, avoidance of unintended pregnancy  and contraceptive alternatives.   Advised to avoid cigarette smoking.  I discussed with the patient that most people either abstain from alcohol or drink within safe limits (<=14/week and <=4 drinks/occasion for males, <=7/weeks and <= 3 drinks/occasion for females) and that the risk for alcohol disorders and other health effects rises proportionally with the number of drinks per week and how often a drinker exceeds daily limits.  Discussed cessation/primary prevention of drug use and availability of treatment for abuse.   Diet: Encouraged to adjust caloric intake to maintain  or achieve ideal body weight, to reduce intake of dietary saturated fat and total fat, to limit sodium intake by avoiding high sodium foods and not adding table salt, and to maintain adequate dietary potassium and calcium preferably from fresh fruits, vegetables, and low-fat dairy products.    stressed the importance of regular exercise  Injury prevention: Discussed safety belts, safety helmets, smoke detector, smoking near bedding or upholstery.   Dental health: Discussed importance of regular tooth brushing, flossing, and dental visits.    NEXT PREVENTATIVE PHYSICAL DUE IN 1 YEAR. Return OK to reschedule for when she's back from her trip.           

## 2017-06-05 NOTE — Assessment & Plan Note (Signed)
Continuing to follow with Dr. Bary Castilla. Call with any concerns. No reoccurrence.

## 2017-06-05 NOTE — Assessment & Plan Note (Signed)
Under good control on current regimen. Refill given today. Continue to monitor. Call with any concerns.

## 2017-06-05 NOTE — Assessment & Plan Note (Signed)
Stable. Continue to monitor. Call with any concerns.  ?

## 2017-06-05 NOTE — Patient Instructions (Addendum)
Health Maintenance for Postmenopausal Women Menopause is a normal process in which your reproductive ability comes to an end. This process happens gradually over a span of months to years, usually between the ages of 22 and 9. Menopause is complete when you have missed 12 consecutive menstrual periods. It is important to talk with your health care provider about some of the most common conditions that affect postmenopausal women, such as heart disease, cancer, and bone loss (osteoporosis). Adopting a healthy lifestyle and getting preventive care can help to promote your health and wellness. Those actions can also lower your chances of developing some of these common conditions. What should I know about menopause? During menopause, you may experience a number of symptoms, such as:  Moderate-to-severe hot flashes.  Night sweats.  Decrease in sex drive.  Mood swings.  Headaches.  Tiredness.  Irritability.  Memory problems.  Insomnia.  Choosing to treat or not to treat menopausal changes is an individual decision that you make with your health care provider. What should I know about hormone replacement therapy and supplements? Hormone therapy products are effective for treating symptoms that are associated with menopause, such as hot flashes and night sweats. Hormone replacement carries certain risks, especially as you become older. If you are thinking about using estrogen or estrogen with progestin treatments, discuss the benefits and risks with your health care provider. What should I know about heart disease and stroke? Heart disease, heart attack, and stroke become more likely as you age. This may be due, in part, to the hormonal changes that your body experiences during menopause. These can affect how your body processes dietary fats, triglycerides, and cholesterol. Heart attack and stroke are both medical emergencies. There are many things that you can do to help prevent heart disease  and stroke:  Have your blood pressure checked at least every 1-2 years. High blood pressure causes heart disease and increases the risk of stroke.  If you are 53-22 years old, ask your health care provider if you should take aspirin to prevent a heart attack or a stroke.  Do not use any tobacco products, including cigarettes, chewing tobacco, or electronic cigarettes. If you need help quitting, ask your health care provider.  It is important to eat a healthy diet and maintain a healthy weight. ? Be sure to include plenty of vegetables, fruits, low-fat dairy products, and lean protein. ? Avoid eating foods that are high in solid fats, added sugars, or salt (sodium).  Get regular exercise. This is one of the most important things that you can do for your health. ? Try to exercise for at least 150 minutes each week. The type of exercise that you do should increase your heart rate and make you sweat. This is known as moderate-intensity exercise. ? Try to do strengthening exercises at least twice each week. Do these in addition to the moderate-intensity exercise.  Know your numbers.Ask your health care provider to check your cholesterol and your blood glucose. Continue to have your blood tested as directed by your health care provider.  What should I know about cancer screening? There are several types of cancer. Take the following steps to reduce your risk and to catch any cancer development as early as possible. Breast Cancer  Practice breast self-awareness. ? This means understanding how your breasts normally appear and feel. ? It also means doing regular breast self-exams. Let your health care provider know about any changes, no matter how small.  If you are 40  or older, have a clinician do a breast exam (clinical breast exam or CBE) every year. Depending on your age, family history, and medical history, it may be recommended that you also have a yearly breast X-ray (mammogram).  If you  have a family history of breast cancer, talk with your health care provider about genetic screening.  If you are at high risk for breast cancer, talk with your health care provider about having an MRI and a mammogram every year.  Breast cancer (BRCA) gene test is recommended for women who have family members with BRCA-related cancers. Results of the assessment will determine the need for genetic counseling and BRCA1 and for BRCA2 testing. BRCA-related cancers include these types: ? Breast. This occurs in males or females. ? Ovarian. ? Tubal. This may also be called fallopian tube cancer. ? Cancer of the abdominal or pelvic lining (peritoneal cancer). ? Prostate. ? Pancreatic.  Cervical, Uterine, and Ovarian Cancer Your health care provider may recommend that you be screened regularly for cancer of the pelvic organs. These include your ovaries, uterus, and vagina. This screening involves a pelvic exam, which includes checking for microscopic changes to the surface of your cervix (Pap test).  For women ages 21-65, health care providers may recommend a pelvic exam and a Pap test every three years. For women ages 79-65, they may recommend the Pap test and pelvic exam, combined with testing for human papilloma virus (HPV), every five years. Some types of HPV increase your risk of cervical cancer. Testing for HPV may also be done on women of any age who have unclear Pap test results.  Other health care providers may not recommend any screening for nonpregnant women who are considered low risk for pelvic cancer and have no symptoms. Ask your health care provider if a screening pelvic exam is right for you.  If you have had past treatment for cervical cancer or a condition that could lead to cancer, you need Pap tests and screening for cancer for at least 20 years after your treatment. If Pap tests have been discontinued for you, your risk factors (such as having a new sexual partner) need to be  reassessed to determine if you should start having screenings again. Some women have medical problems that increase the chance of getting cervical cancer. In these cases, your health care provider may recommend that you have screening and Pap tests more often.  If you have a family history of uterine cancer or ovarian cancer, talk with your health care provider about genetic screening.  If you have vaginal bleeding after reaching menopause, tell your health care provider.  There are currently no reliable tests available to screen for ovarian cancer.  Lung Cancer Lung cancer screening is recommended for adults 69-62 years old who are at high risk for lung cancer because of a history of smoking. A yearly low-dose CT scan of the lungs is recommended if you:  Currently smoke.  Have a history of at least 30 pack-years of smoking and you currently smoke or have quit within the past 15 years. A pack-year is smoking an average of one pack of cigarettes per day for one year.  Yearly screening should:  Continue until it has been 15 years since you quit.  Stop if you develop a health problem that would prevent you from having lung cancer treatment.  Colorectal Cancer  This type of cancer can be detected and can often be prevented.  Routine colorectal cancer screening usually begins at  age 42 and continues through age 45.  If you have risk factors for colon cancer, your health care provider may recommend that you be screened at an earlier age.  If you have a family history of colorectal cancer, talk with your health care provider about genetic screening.  Your health care provider may also recommend using home test kits to check for hidden blood in your stool.  A small camera at the end of a tube can be used to examine your colon directly (sigmoidoscopy or colonoscopy). This is done to check for the earliest forms of colorectal cancer.  Direct examination of the colon should be repeated every  5-10 years until age 71. However, if early forms of precancerous polyps or small growths are found or if you have a family history or genetic risk for colorectal cancer, you may need to be screened more often.  Skin Cancer  Check your skin from head to toe regularly.  Monitor any moles. Be sure to tell your health care provider: ? About any new moles or changes in moles, especially if there is a change in a mole's shape or color. ? If you have a mole that is larger than the size of a pencil eraser.  If any of your family members has a history of skin cancer, especially at a young age, talk with your health care provider about genetic screening.  Always use sunscreen. Apply sunscreen liberally and repeatedly throughout the day.  Whenever you are outside, protect yourself by wearing long sleeves, pants, a wide-brimmed hat, and sunglasses.  What should I know about osteoporosis? Osteoporosis is a condition in which bone destruction happens more quickly than new bone creation. After menopause, you may be at an increased risk for osteoporosis. To help prevent osteoporosis or the bone fractures that can happen because of osteoporosis, the following is recommended:  If you are 46-71 years old, get at least 1,000 mg of calcium and at least 600 mg of vitamin D per day.  If you are older than age 55 but younger than age 65, get at least 1,200 mg of calcium and at least 600 mg of vitamin D per day.  If you are older than age 54, get at least 1,200 mg of calcium and at least 800 mg of vitamin D per day.  Smoking and excessive alcohol intake increase the risk of osteoporosis. Eat foods that are rich in calcium and vitamin D, and do weight-bearing exercises several times each week as directed by your health care provider. What should I know about how menopause affects my mental health? Depression may occur at any age, but it is more common as you become older. Common symptoms of depression  include:  Low or sad mood.  Changes in sleep patterns.  Changes in appetite or eating patterns.  Feeling an overall lack of motivation or enjoyment of activities that you previously enjoyed.  Frequent crying spells.  Talk with your health care provider if you think that you are experiencing depression. What should I know about immunizations? It is important that you get and maintain your immunizations. These include:  Tetanus, diphtheria, and pertussis (Tdap) booster vaccine.  Influenza every year before the flu season begins.  Pneumonia vaccine.  Shingles vaccine.  Your health care provider may also recommend other immunizations. This information is not intended to replace advice given to you by your health care provider. Make sure you discuss any questions you have with your health care provider. Document Released: 04/28/2005  Document Revised: 09/24/2015 Document Reviewed: 12/08/2014 Elsevier Interactive Patient Education  2018 [employer]  

## 2017-06-05 NOTE — Assessment & Plan Note (Signed)
Has 2 refills left on her xanax. Using appropriately. Continue current regimen. Continue to monitor. OK to reschedule 3 month follow up until after her trip.

## 2017-06-05 NOTE — Assessment & Plan Note (Signed)
Not interested in quitting right now. She knows we're here if she would like to quit and needs help.

## 2017-06-05 NOTE — Assessment & Plan Note (Signed)
Rechecking levels today. Await results. Treat as needed.  

## 2017-06-05 NOTE — Assessment & Plan Note (Signed)
Rechecking levels today. Await results. Call with any concerns.  

## 2017-06-14 ENCOUNTER — Ambulatory Visit: Payer: Managed Care, Other (non HMO) | Admitting: General Surgery

## 2017-06-17 NOTE — Progress Notes (Signed)
BP 123/72 (BP Location: Left Arm, Patient Position: Sitting, Cuff Size: Normal)   Pulse 82   Temp (!) 97.5 F (36.4 C)   Wt 141 lb 5 oz (64.1 kg)   SpO2 98%   BMI 24.87 kg/m    Subjective:    Patient ID: Maureen [name], female    DOB: December 06, 1945, 72 y.o.   MRN: [id number]  HPI: Maureen [name] is a 72 y.o. female  Chief Complaint  Patient presents with  . Cough    face pain   UPPER RESPIRATORY TRACT INFECTION- went to see clinic at work and she wanted to change her medicine. She was not happy with that. So she came back here.  Duration: 5-6 days Worst symptom: cough, sinus pain Fever: no- chills Cough: yes Shortness of breath: no Wheezing: yes, at night Chest pain: no Chest tightness: no Chest congestion: yes Nasal congestion: yes Runny nose: yes Post nasal drip: yes Sneezing: yes Sore throat: yes Swollen glands: no Sinus pressure: yes Headache: yes Face pain: no Toothache: yes Ear pain: yes "right Ear pressure: yes "right Eyes red/itching:yes Eye drainage/crusting: yes  Vomiting: no Rash: no Fatigue: yes Sick contacts: yes Strep contacts: no  Context: stable Recurrent sinusitis: no Relief with OTC cold/cough medications: no  Treatments attempted: mucinex    Relevant past medical, surgical, family and social history reviewed and updated as indicated. Interim medical history since our last visit reviewed. Allergies and medications reviewed and updated.  Review of Systems  Constitutional: Positive for fatigue. Negative for activity change, appetite change, chills, diaphoresis, fever and unexpected weight change.  HENT: Positive for congestion, ear pain, postnasal drip, rhinorrhea, sinus pressure and sinus pain. Negative for dental problem, drooling, ear discharge, facial swelling, hearing loss, mouth sores, nosebleeds, sneezing, sore throat, tinnitus, trouble swallowing and voice change.   Respiratory: Negative.   Cardiovascular: Negative.     Psychiatric/Behavioral: Negative.     Per HPI unless specifically indicated above     Objective:    BP 123/72 (BP Location: Left Arm, Patient Position: Sitting, Cuff Size: Normal)   Pulse 82   Temp (!) 97.5 F (36.4 C)   Wt 141 lb 5 oz (64.1 kg)   SpO2 98%   BMI 24.87 kg/m   Wt Readings from Last 3 Encounters:  06/18/17 141 lb 5 oz (64.1 kg)  06/05/17 142 lb 2 oz (64.5 kg)  04/19/17 144 lb 4 oz (65.4 kg)    Physical Exam  Constitutional: She is oriented to person, place, and time. She appears well-developed and well-nourished. No distress.  HENT:  Head: Normocephalic and atraumatic.  Right Ear: Hearing and external ear normal.  Left Ear: Hearing and external ear normal.  Nose: Nose normal.  Mouth/Throat: Oropharynx is clear and moist. No oropharyngeal exudate.  Eyes: Pupils are equal, round, and reactive to light. Conjunctivae, EOM and lids are normal. Right eye exhibits no discharge. Left eye exhibits no discharge. No scleral icterus.  Neck: Normal range of motion. Neck supple. No JVD present. No tracheal deviation present. No thyromegaly present.  Cardiovascular: Normal rate, regular rhythm, normal heart sounds and intact distal pulses. Exam reveals no gallop and no friction rub.  No murmur heard. Pulmonary/Chest: Effort normal and breath sounds normal. No stridor. No respiratory distress. She has no wheezes. She has no rales. She exhibits no tenderness.  Musculoskeletal: Normal range of motion.  Lymphadenopathy:    She has no cervical adenopathy.  Neurological: She is alert and oriented to person, place,  and time.  Skin: Skin is warm, dry and intact. No rash noted. She is not diaphoretic. No erythema. No pallor.  Psychiatric: She has a normal mood and affect. Her speech is normal and behavior is normal. Judgment and thought content normal. Cognition and memory are normal.  Nursing note and vitals reviewed.   Results for orders placed or performed in visit on 01/10/17   Lipid Profile  Result Value Ref Range   Cholesterol, Total 221 (H) 100 - 199 mg/dL   Triglycerides 114 0 - 149 mg/dL   HDL 48 >39 mg/dL   VLDL Cholesterol Cal 23 5 - 40 mg/dL   LDL Calculated 150 (H) 0 - 99 mg/dL   Chol/HDL Ratio 4.6 (H) 0.0 - 4.4 ratio  Comp Met (CMET)  Result Value Ref Range   Glucose 101 (H) 65 - 99 mg/dL   BUN 14 8 - 27 mg/dL   Creatinine, Ser 1.06 (H) 0.57 - 1.00 mg/dL   GFR calc non Af Amer 53 (L) >59 mL/min/1.73   GFR calc Af Amer 61 >59 mL/min/1.73   BUN/Creatinine Ratio 13 12 - 28   Sodium 140 134 - 144 mmol/L   Potassium 4.4 3.5 - 5.2 mmol/L   Chloride 103 96 - 106 mmol/L   CO2 23 20 - 29 mmol/L   Calcium 10.0 8.7 - 10.3 mg/dL   Total Protein 6.6 6.0 - 8.5 g/dL   Albumin 4.4 3.5 - 4.8 g/dL   Globulin, Total 2.2 1.5 - 4.5 g/dL   Albumin/Globulin Ratio 2.0 1.2 - 2.2   Bilirubin Total 0.2 0.0 - 1.2 mg/dL   Alkaline Phosphatase 95 39 - 117 IU/L   AST 12 0 - 40 IU/L   ALT 12 0 - 32 IU/L  Vitamin D (25 hydroxy)  Result Value Ref Range   Vit D, 25-Hydroxy 33.9 30.0 - 100.0 ng/mL  Specimen Status  Result Value Ref Range   WBC WILL FOLLOW    RBC WILL FOLLOW    Hemoglobin WILL FOLLOW    Hematocrit WILL FOLLOW    MCV WILL FOLLOW    MCH WILL FOLLOW    MCHC WILL FOLLOW    RDW WILL FOLLOW    Platelets WILL FOLLOW    Neutrophils WILL FOLLOW    Lymphs WILL FOLLOW    Monocytes WILL FOLLOW    Eos WILL FOLLOW    Basos WILL FOLLOW    Neutrophils Absolute WILL FOLLOW    Lymphocytes Absolute WILL FOLLOW    Monocytes Absolute WILL FOLLOW    EOS (ABSOLUTE) WILL FOLLOW    Basophils Absolute WILL FOLLOW    Immature Granulocytes WILL FOLLOW    Immature Grans (Abs) WILL FOLLOW       Assessment & Plan:   Problem List Items Addressed This Visit    None    Visit Diagnoses    Viral upper respiratory tract infection    -  Primary   Will treat with prednisone and tessalon perles. Call with any concerns or if not getting better. Will treat with abx if not  better by Thursday.       Follow up plan: Return if symptoms worsen or fail to improve.

## 2017-06-18 ENCOUNTER — Ambulatory Visit (INDEPENDENT_AMBULATORY_CARE_PROVIDER_SITE_OTHER): Payer: Managed Care, Other (non HMO) | Admitting: Family Medicine

## 2017-06-18 ENCOUNTER — Encounter: Payer: Self-pay | Admitting: Family Medicine

## 2017-06-18 VITALS — BP 123/72 | HR 82 | Temp 97.5°F | Wt 141.3 lb

## 2017-06-18 DIAGNOSIS — J069 Acute upper respiratory infection, unspecified: Secondary | ICD-10-CM

## 2017-06-18 MED ORDER — ALBUTEROL SULFATE HFA 108 (90 BASE) MCG/ACT IN AERS
2.0000 | INHALATION_SPRAY | Freq: Four times a day (QID) | RESPIRATORY_TRACT | 0 refills | Status: DC | PRN
Start: 2017-06-18 — End: 2017-07-15

## 2017-06-18 MED ORDER — PREDNISONE 20 MG PO TABS: 20.0000 mg | ORAL_TABLET | Freq: Every day | ORAL | 0 refills | Status: DC

## 2017-06-18 MED ORDER — BENZONATATE 200 MG PO CAPS: 200.0000 mg | ORAL_CAPSULE | Freq: Two times a day (BID) | ORAL | 0 refills | Status: DC | PRN

## 2017-06-19 ENCOUNTER — Other Ambulatory Visit
Admission: RE | Admit: 2017-06-19 | Discharge: 2017-06-19 | Disposition: A | Discharge status: Home / Self Care | Payer: Managed Care, Other (non HMO) | Source: Ambulatory Visit | Attending: General Surgery | Admitting: General Surgery

## 2017-06-19 ENCOUNTER — Ambulatory Visit: Payer: Managed Care, Other (non HMO) | Admitting: General Surgery

## 2017-06-19 ENCOUNTER — Encounter: Payer: Self-pay | Admitting: General Surgery

## 2017-06-19 VITALS — BP 130/70 | HR 72 | Resp 14 | Ht 60.0 in | Wt 141.0 lb

## 2017-06-19 DIAGNOSIS — Z853 Personal history of malignant neoplasm of breast: Secondary | ICD-10-CM

## 2017-06-19 DIAGNOSIS — R1011 Right upper quadrant pain: Secondary | ICD-10-CM

## 2017-06-19 LAB — CBC WITH DIFFERENTIAL/PLATELET
BASOS ABS: 0 10*3/uL (ref 0–0.1)
BASOS PCT: 1 %
EOS ABS: 0.1 10*3/uL (ref 0–0.7)
EOS PCT: 1 %
HCT: 40.6 % (ref 35.0–47.0)
Hemoglobin: 13.8 g/dL (ref 12.0–16.0)
LYMPHS PCT: 40 %
Lymphs Abs: 2.5 10*3/uL (ref 1.0–3.6)
MCH: 31.3 pg (ref 26.0–34.0)
MCHC: 33.9 g/dL (ref 32.0–36.0)
MCV: 92.2 fL (ref 80.0–100.0)
Monocytes Absolute: 0.3 10*3/uL (ref 0.2–0.9)
Monocytes Relative: 5 %
Neutro Abs: 3.4 10*3/uL (ref 1.4–6.5)
Neutrophils Relative %: 53 %
PLATELETS: 167 10*3/uL (ref 150–440)
RBC: 4.4 MIL/uL (ref 3.80–5.20)
RDW: 13.9 % (ref 11.5–14.5)
WBC: 6.4 10*3/uL (ref 3.6–11.0)

## 2017-06-19 NOTE — Patient Instructions (Addendum)
Patient to have a ultrasound and CBC .   The patient is scheduled for an abdominal ultrasound at [facility] on 06/22/17 at 10:30 am. She is to arrive by 10:15 am and have nothing to eat or drink for four hours prior. She will have her labs done today.

## 2017-06-19 NOTE — Progress Notes (Signed)
Patient ID: Maureen [name], female   DOB: January 31, 1946, 72 y.o.   MRN: [id number]  Chief Complaint  Patient presents with  . Breast Cancer    HPI Maureen [name] is a 72 y.o. female here today for her one year follow up breast cancer. Patient states she is has been having some abdominal pain off and on since November 2018.  This is primarily in the right upper quadrant and epigastrium.  She has noticed some bloating in here belly, not necessarily related to meals. Every time she eats she has the need to move her bowels.  She reports no blood or mucus in the stools.  No weight loss.  She HPI  Past Medical History:  Diagnosis Date  . Arthritis 2005  . 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 This was a histologic grade 2 tumor measuring just under 1 cm in diameter. No invasive cancer was identified. ER 90%, PR 40%.  . Breast cancer, right breast ([place]) 1994   Invasive ductal carcinoma.The patient underwent right mastectomy in 1994 for invasive ductal carcinoma.  . Discoid lupus 2005  . Heart murmur 2005  . Hypercholesterolemia 2012  . Mammographic microcalcification 2011  . Panic attack   . Personal history of colonic polyps    Colon polyps  . Personal history of tobacco use, presenting hazards to health   . PUD (peptic ulcer disease)    Last EGD 1/09  . Rectal polyp 2008   villous adenoma; last colonoscopy 1/09 WNL  . Skin cancer, basal cell 2015   right cheek Moh[facility] surgery at [facility]  . Ulcer     Past Surgical History:  Procedure Laterality Date  . APPENDECTOMY  2011  . BREAST BIOPSY  12/27/2009   right breast  . CARDIAC CATHETERIZATION  1996   Negative Cath  . COLONOSCOPY N/A 08/05/2014   Procedure: COLONOSCOPY;  Surgeon: [name], MD;  Location: [facility] ENDOSCOPY;  EGD, tubular adenoma 1.  . COLONOSCOPY W/ BIOPSIES  2005,2009   Dr. Bary Castilla. Normal exam 2009. Villous adenoma of the rectum in 2005  adenomatous polyp from the ascending colon and rectum in 2002.  . ESOPHAGOGASTRODUODENOSCOPY N/A 08/05/2014   Procedure: ESOPHAGOGASTRODUODENOSCOPY (EGD);  Surgeon: [name], MD;  Location: [facility] ENDOSCOPY;  Service: Endoscopy;  Laterality: N/A;  . MASTECTOMY     bilateral  . SIMPLE MASTECTOMY  1993   right  . SIMPLE MASTECTOMY  2011   left  . TONSILLECTOMY  1997  . UPPER GI ENDOSCOPY  2003, 2005, 2009, 2012, 2014   2012 showed mild reflux changes without evidence of Barrett's epithelial changes 2014 biopsies at 37 cm showed changes suggestive of reflux esophagitis. No metaplasia.  Marland Kitchen VAGINAL HYSTERECTOMY  1977    Family History  Problem Relation Age of Onset  . Cancer Father        esophagus  . Cancer Maternal Aunt        breast cancer  . Cancer Maternal Grandmother        breast cancer    Social History Social History   Tobacco Use  . Smoking status: Current Every Day Smoker    Packs/day: 1.00    Years: 40.00    Pack years: 40.00    Types: Cigarettes  . Smokeless tobacco: Never Used  . Tobacco comment: Pt wants to do hypnotizing  Substance Use Topics  . Alcohol use: Yes    Alcohol/week: 0.6 -  1.2 oz    Types: 1 - 2 Standard drinks or equivalent per week  . Drug use: No    Allergies  Allergen Reactions  . Codeine Itching  . Sulfa Antibiotics Hives  . Sulfinpyrazone   . Erythromycin Rash  . Penicillins Rash    Current Outpatient Medications  Medication Sig Dispense Refill  . acetaminophen (TYLENOL) 500 MG tablet Take 500 mg by mouth every 6 (six) hours as needed for headache.    . albuterol (PROVENTIL HFA;VENTOLIN HFA) 108 (90 Base) MCG/ACT inhaler Inhale 2 puffs into the lungs every 6 (six) hours as needed for wheezing or shortness of breath. 1 Inhaler 0  . ALPRAZolam (XANAX) 0.5 MG tablet TAKE 1/2 TO 1 TABLET BY MOUTH 2 TIMES A DAY AS NEEDED FOR ANXIETY 60 tablet 2  . benzonatate (TESSALON) 200 MG capsule Take 1 capsule (200 mg total) by mouth 2 (two)  times daily as needed for cough. 20 capsule 0  . budesonide-formoterol (SYMBICORT) 160-4.5 MCG/ACT inhaler Inhale 2 puffs into the lungs 2 (two) times daily. 1 Inhaler 6  . cholecalciferol (VITAMIN D) 1000 units tablet Take 2,000 Units by mouth daily.    . clobetasol cream (TEMOVATE) 9.56 % Apply 1 application topically daily.    . Homeopathic Products (ZICAM ALLERGY RELIEF NA) Place 2 sprays into the nose daily as needed (allergies).     Marland Kitchen omeprazole (PRILOSEC) 20 MG capsule Take 1 capsule (20 mg total) by mouth daily as needed (acid reflux). 90 capsule 4  . predniSONE (DELTASONE) 20 MG tablet Take 1 tablet (20 mg total) by mouth daily with breakfast. 5 tablet 0  . RA KRILL OIL 500 MG CAPS Take 500 mg by mouth daily.    Marland Kitchen zinc gluconate 50 MG tablet Take 50 mg by mouth every other day.     No current facility-administered medications for this visit.     Review of Systems Review of Systems  Constitutional: Positive for chills.  Respiratory: Negative.   Cardiovascular: Negative.   Gastrointestinal: Positive for abdominal distention. Negative for diarrhea and vomiting.    Blood pressure 130/70, pulse 72, resp. rate 14, height 5' (1.524 m), weight 141 lb (64 kg).  Physical Exam Physical Exam  Constitutional: She is oriented to person, place, and time. She appears well-developed and well-nourished.  Eyes: Conjunctivae are normal.  Neck: Neck supple.  Cardiovascular: Normal rate, regular rhythm and normal heart sounds.  Pulmonary/Chest: Effort normal and breath sounds normal.  Mastectomy sites are clean and well healed.   Abdominal: Soft. Normal appearance and bowel sounds are normal. There is hepatosplenomegaly. There is tenderness in the right upper quadrant.    Lymphadenopathy:    She has no cervical adenopathy.  Neurological: She is alert and oriented to person, place, and time.  Skin: Skin is warm and dry.    Data Reviewed CBC completed earlier today showed a hemoglobin of  13.8 with an MCV of 92, white blood cell count of 6400 with a normal differential.  No interval change over the last 2 years.  Assessment    No evidence of recurrent breast cancer.  Epigastric/right upper quadrant pain    Plan  We will arrange for the patient have an abdominal ultrasound to look for cholelithiasis.  HPI, Physical Exam, Assessment and Plan have been scribed under the direction and in the presence of Hervey Ard, MD.  Gaspar Cola, CMA  I have completed the exam and reviewed the above documentation for accuracy and completeness.  I  agree with the above.  Haematologist has been used and any errors in dictation or transcription are unintentional.  Hervey Ard, M.D., F.A.C.S.  The patient is scheduled for an abdominal ultrasound at [facility] on 06/22/17 at 10:30 am. She is to arrive by 10:15 am and have nothing to eat or drink for four hours prior. She will have her labs done today. Documented by Lesly Rubenstein LPN   Forest Gleason Thad Osoria 06/19/2017, 5:02 PM

## 2017-06-22 ENCOUNTER — Ambulatory Visit
Admission: RE | Admit: 2017-06-22 | Discharge: 2017-06-22 | Disposition: A | Discharge status: Home / Self Care | Payer: Managed Care, Other (non HMO) | Source: Ambulatory Visit | Attending: General Surgery | Admitting: General Surgery

## 2017-06-22 DIAGNOSIS — K760 Fatty (change of) liver, not elsewhere classified: Secondary | ICD-10-CM | POA: Diagnosis not present

## 2017-06-22 DIAGNOSIS — Z853 Personal history of malignant neoplasm of breast: Secondary | ICD-10-CM

## 2017-06-22 DIAGNOSIS — R1010 Upper abdominal pain, unspecified: Secondary | ICD-10-CM | POA: Insufficient documentation

## 2017-06-25 ENCOUNTER — Ambulatory Visit: Payer: Self-pay | Admitting: Family Medicine

## 2017-06-25 DIAGNOSIS — Z0189 Encounter for other specified special examinations: Principal | ICD-10-CM

## 2017-06-25 DIAGNOSIS — Z008 Encounter for other general examination: Secondary | ICD-10-CM

## 2017-06-25 DIAGNOSIS — E559 Vitamin D deficiency, unspecified: Secondary | ICD-10-CM

## 2017-06-25 DIAGNOSIS — E782 Mixed hyperlipidemia: Secondary | ICD-10-CM

## 2017-06-25 DIAGNOSIS — F411 Generalized anxiety disorder: Secondary | ICD-10-CM

## 2017-06-25 DIAGNOSIS — Z0000 Encounter for general adult medical examination without abnormal findings: Secondary | ICD-10-CM

## 2017-06-25 LAB — GLUCOSE, POCT (MANUAL RESULT ENTRY): POC Glucose: 98 mg/dl (ref 70–99)

## 2017-06-25 NOTE — Addendum Note (Signed)
Addended by: Carlene Coria on: 06/25/2017 08:31 AM   Modules accepted: Orders

## 2017-06-25 NOTE — Progress Notes (Signed)
Subjective: Annual biometrics screening labs Patient presents for annual biometric screening labs, patient saw her primary care provider on 06/05/17 for her annual physical exam and routine visit, therefore does not need this today.  Labs were placed by primary care provider, this is a lab only visit.    Plan  Lipid panel pending. Fasting blood sugar 98 today.

## 2017-06-26 ENCOUNTER — Encounter: Payer: Self-pay | Admitting: Family Medicine

## 2017-06-26 LAB — URINALYSIS, ROUTINE W REFLEX MICROSCOPIC
BILIRUBIN UA: NEGATIVE
Glucose, UA: NEGATIVE
Ketones, UA: NEGATIVE
LEUKOCYTES UA: NEGATIVE
Nitrite, UA: NEGATIVE
PH UA: 6.5 (ref 5.0–7.5)
PROTEIN UA: NEGATIVE
RBC UA: NEGATIVE
SPEC GRAV UA: 1.016 (ref 1.005–1.030)
Urobilinogen, Ur: 0.2 mg/dL (ref 0.2–1.0)

## 2017-06-26 LAB — CBC WITH DIFFERENTIAL/PLATELET
BASOS ABS: 0 10*3/uL (ref 0.0–0.2)
Basos: 0 %
EOS (ABSOLUTE): 0.2 10*3/uL (ref 0.0–0.4)
EOS: 2 %
HEMATOCRIT: 40.2 % (ref 34.0–46.6)
HEMOGLOBIN: 13.5 g/dL (ref 11.1–15.9)
IMMATURE GRANS (ABS): 0 10*3/uL (ref 0.0–0.1)
IMMATURE GRANULOCYTES: 0 %
LYMPHS: 40 %
Lymphocytes Absolute: 3.1 10*3/uL (ref 0.7–3.1)
MCH: 31.2 pg (ref 26.6–33.0)
MCHC: 33.6 g/dL (ref 31.5–35.7)
MCV: 93 fL (ref 79–97)
MONOCYTES: 7 %
Monocytes Absolute: 0.5 10*3/uL (ref 0.1–0.9)
NEUTROS PCT: 51 %
Neutrophils Absolute: 3.9 10*3/uL (ref 1.4–7.0)
Platelets: 193 10*3/uL (ref 150–379)
RBC: 4.33 x10E6/uL (ref 3.77–5.28)
RDW: 14.5 % (ref 12.3–15.4)
WBC: 7.8 10*3/uL (ref 3.4–10.8)

## 2017-06-26 LAB — COMPREHENSIVE METABOLIC PANEL
ALBUMIN: 4.3 g/dL (ref 3.5–4.8)
ALT: 22 IU/L (ref 0–32)
AST: 14 IU/L (ref 0–40)
Albumin/Globulin Ratio: 2 (ref 1.2–2.2)
Alkaline Phosphatase: 83 IU/L (ref 39–117)
BUN / CREAT RATIO: 16 (ref 12–28)
BUN: 17 mg/dL (ref 8–27)
Bilirubin Total: 0.4 mg/dL (ref 0.0–1.2)
CALCIUM: 10.2 mg/dL (ref 8.7–10.3)
CO2: 23 mmol/L (ref 20–29)
CREATININE: 1.07 mg/dL — AB (ref 0.57–1.00)
Chloride: 105 mmol/L (ref 96–106)
GFR calc Af Amer: 60 mL/min/{1.73_m2} (ref >59)
GFR, EST NON AFRICAN AMERICAN: 52 mL/min/{1.73_m2} — AB (ref >59)
GLOBULIN, TOTAL: 2.2 g/dL (ref 1.5–4.5)
GLUCOSE: 100 mg/dL — AB (ref 65–99)
Potassium: 4.8 mmol/L (ref 3.5–5.2)
Sodium: 142 mmol/L (ref 134–144)
Total Protein: 6.5 g/dL (ref 6.0–8.5)

## 2017-06-26 LAB — LIPID PANEL
CHOL/HDL RATIO: 5.2 ratio — AB (ref 0.0–4.4)
CHOLESTEROL TOTAL: 236 mg/dL — AB (ref 100–199)
HDL: 45 mg/dL (ref >39)
LDL CALC: 153 mg/dL — AB (ref 0–99)
Triglycerides: 192 mg/dL — ABNORMAL HIGH (ref 0–149)
VLDL CHOLESTEROL CAL: 38 mg/dL (ref 5–40)

## 2017-06-26 LAB — VITAMIN D 25 HYDROXY (VIT D DEFICIENCY, FRACTURES): Vit D, 25-Hydroxy: 18.8 ng/mL — ABNORMAL LOW (ref 30.0–100.0)

## 2017-06-26 LAB — TSH: TSH: 0.957 u[IU]/mL (ref 0.450–4.500)

## 2017-06-28 ENCOUNTER — Other Ambulatory Visit: Payer: Self-pay

## 2017-06-28 ENCOUNTER — Telehealth: Payer: Self-pay

## 2017-06-28 DIAGNOSIS — R1011 Right upper quadrant pain: Secondary | ICD-10-CM

## 2017-06-28 NOTE — Telephone Encounter (Signed)
Spoke with patient and notified of results. Let her know that Dr Bary Castilla wanted her to have a HIDA scan with stimulation and an EGD after that. She is amendable to this. The patient is scheduled for a HIDA scan with stim at [facility] on 07/17/17 at 8:30 am. She is to arrive at the [facility] registration desk at 8:00 am and have nothing to eat or drink after midnight the night prior.   The patient is scheduled for an Upper Endoscopy at [facility] on 07/25/17. They are aware to call the day before to get their arrival time. Instructions have been review with the patient and a copy has been mailed to her home. The patient is aware of date and instructions.

## 2017-06-28 NOTE — Telephone Encounter (Signed)
-----   Message from [name], MD sent at 06/28/2017  8:22 AM EDT ----- Please notify the patient labs OK, U/S does not show any stones. Would like her to have a HIDA w/ stimulation, schedule for EGD anticipating that study will be normal.  ----- Message ----- From: Interface, Rad Results In Sent: 06/22/2017   3:32 PM To: [name], MD

## 2017-06-29 ENCOUNTER — Other Ambulatory Visit: Payer: Self-pay | Admitting: General Surgery

## 2017-06-29 DIAGNOSIS — R1011 Right upper quadrant pain: Secondary | ICD-10-CM

## 2017-07-13 ENCOUNTER — Ambulatory Visit: Payer: Managed Care, Other (non HMO) | Admitting: Family Medicine

## 2017-07-15 ENCOUNTER — Other Ambulatory Visit: Payer: Self-pay | Admitting: Family Medicine

## 2017-07-17 ENCOUNTER — Encounter
Admission: RE | Admit: 2017-07-17 | Discharge: 2017-07-17 | Disposition: A | Discharge status: Home / Self Care | Payer: Managed Care, Other (non HMO) | Source: Ambulatory Visit | Attending: General Surgery | Admitting: General Surgery

## 2017-07-17 DIAGNOSIS — R1011 Right upper quadrant pain: Secondary | ICD-10-CM | POA: Insufficient documentation

## 2017-07-17 MED ORDER — TECHNETIUM TC 99M MEBROFENIN IV KIT
5.0000 | PACK | Freq: Once | INTRAVENOUS | Status: AC | PRN
Start: 2017-07-17 — End: 2017-07-17
  Administered 2017-07-17: 5 via INTRAVENOUS

## 2017-07-19 ENCOUNTER — Other Ambulatory Visit: Payer: Self-pay | Admitting: General Surgery

## 2017-07-19 ENCOUNTER — Telehealth: Payer: Self-pay | Admitting: *Deleted

## 2017-07-19 MED ORDER — DICYCLOMINE HCL 10 MG PO CAPS: 10.0000 mg | ORAL_CAPSULE | Freq: Three times a day (TID) | ORAL | 0 refills | Status: DC

## 2017-07-19 NOTE — Telephone Encounter (Signed)
Patient has been notified as instructed. She wishes her prescription to be sent in to [facility] in [place]. This has been done accordingly. Follow up appointment arranged. The patient is aware of date and time.  We will proceed with upper endoscopy as scheduled for next Wednesday, 07-25-17 at [facility] with Dr. Bary Castilla. The patient reports no changes in health or medications since her last office visit.   The patient was instructed to call the office should she have further questions.

## 2017-07-19 NOTE — Telephone Encounter (Signed)
-----   Message from [name], MD sent at 07/19/2017  9:30 AM EDT ----- Please notify the patient that her gallbladder is overactive, but may be quieted with a short course of Bentyl, 10 mg 30 minutes before meals 3 times a day.  I would like you to make use of a 10-day trial to see if this helps.  Appointment in 2 weeks for follow-up.  Thank you ----- Message ----- From: Interface, Rad Results In Sent: 07/17/2017  11:09 AM To: [name], MD

## 2017-07-24 ENCOUNTER — Ambulatory Visit: Payer: Managed Care, Other (non HMO) | Admitting: Family Medicine

## 2017-07-25 ENCOUNTER — Ambulatory Visit: Payer: Managed Care, Other (non HMO) | Admitting: Anesthesiology

## 2017-07-25 ENCOUNTER — Ambulatory Visit
Admission: RE | Admit: 2017-07-25 | Discharge: 2017-07-25 | Disposition: A | Discharge status: Home / Self Care | Payer: Managed Care, Other (non HMO) | Source: Ambulatory Visit | Attending: General Surgery | Admitting: General Surgery

## 2017-07-25 ENCOUNTER — Encounter
Admission: RE | Disposition: A | Discharge status: Home / Self Care | Payer: Self-pay | Source: Ambulatory Visit | Attending: General Surgery

## 2017-07-25 ENCOUNTER — Encounter: Payer: Self-pay | Admitting: *Deleted

## 2017-07-25 DIAGNOSIS — K317 Polyp of stomach and duodenum: Secondary | ICD-10-CM | POA: Insufficient documentation

## 2017-07-25 DIAGNOSIS — Z882 Allergy status to sulfonamides status: Secondary | ICD-10-CM | POA: Insufficient documentation

## 2017-07-25 DIAGNOSIS — Z85828 Personal history of other malignant neoplasm of skin: Secondary | ICD-10-CM | POA: Diagnosis not present

## 2017-07-25 DIAGNOSIS — Z8711 Personal history of peptic ulcer disease: Secondary | ICD-10-CM | POA: Insufficient documentation

## 2017-07-25 DIAGNOSIS — M1990 Unspecified osteoarthritis, unspecified site: Secondary | ICD-10-CM | POA: Insufficient documentation

## 2017-07-25 DIAGNOSIS — K3189 Other diseases of stomach and duodenum: Secondary | ICD-10-CM | POA: Diagnosis not present

## 2017-07-25 DIAGNOSIS — Z880 Allergy status to penicillin: Secondary | ICD-10-CM | POA: Diagnosis not present

## 2017-07-25 DIAGNOSIS — R011 Cardiac murmur, unspecified: Secondary | ICD-10-CM | POA: Diagnosis not present

## 2017-07-25 DIAGNOSIS — K219 Gastro-esophageal reflux disease without esophagitis: Secondary | ICD-10-CM | POA: Diagnosis not present

## 2017-07-25 DIAGNOSIS — R1011 Right upper quadrant pain: Secondary | ICD-10-CM

## 2017-07-25 DIAGNOSIS — E7800 Pure hypercholesterolemia, unspecified: Secondary | ICD-10-CM | POA: Insufficient documentation

## 2017-07-25 DIAGNOSIS — Z853 Personal history of malignant neoplasm of breast: Secondary | ICD-10-CM | POA: Diagnosis not present

## 2017-07-25 DIAGNOSIS — K210 Gastro-esophageal reflux disease with esophagitis: Secondary | ICD-10-CM | POA: Diagnosis not present

## 2017-07-25 DIAGNOSIS — F410 Panic disorder [episodic paroxysmal anxiety] without agoraphobia: Secondary | ICD-10-CM | POA: Diagnosis not present

## 2017-07-25 DIAGNOSIS — Z86010 Personal history of colonic polyps: Secondary | ICD-10-CM | POA: Insufficient documentation

## 2017-07-25 DIAGNOSIS — K228 Other specified diseases of esophagus: Secondary | ICD-10-CM | POA: Diagnosis not present

## 2017-07-25 DIAGNOSIS — Z9013 Acquired absence of bilateral breasts and nipples: Secondary | ICD-10-CM | POA: Diagnosis not present

## 2017-07-25 DIAGNOSIS — L930 Discoid lupus erythematosus: Secondary | ICD-10-CM | POA: Insufficient documentation

## 2017-07-25 DIAGNOSIS — Z79899 Other long term (current) drug therapy: Secondary | ICD-10-CM | POA: Diagnosis not present

## 2017-07-25 DIAGNOSIS — F17210 Nicotine dependence, cigarettes, uncomplicated: Secondary | ICD-10-CM | POA: Insufficient documentation

## 2017-07-25 DIAGNOSIS — R1013 Epigastric pain: Secondary | ICD-10-CM | POA: Diagnosis present

## 2017-07-25 DIAGNOSIS — D130 Benign neoplasm of esophagus: Secondary | ICD-10-CM | POA: Diagnosis not present

## 2017-07-25 DIAGNOSIS — K449 Diaphragmatic hernia without obstruction or gangrene: Secondary | ICD-10-CM | POA: Insufficient documentation

## 2017-07-25 DIAGNOSIS — Z881 Allergy status to other antibiotic agents status: Secondary | ICD-10-CM | POA: Insufficient documentation

## 2017-07-25 DIAGNOSIS — Z885 Allergy status to narcotic agent status: Secondary | ICD-10-CM | POA: Diagnosis not present

## 2017-07-25 HISTORY — PX: ESOPHAGOGASTRODUODENOSCOPY (EGD) WITH PROPOFOL: SHX5813

## 2017-07-25 HISTORY — DX: Gastro-esophageal reflux disease without esophagitis: K21.9

## 2017-07-25 SURGERY — ESOPHAGOGASTRODUODENOSCOPY (EGD) WITH PROPOFOL
Anesthesia: General

## 2017-07-25 MED ORDER — SODIUM CHLORIDE 0.9 % IV SOLN
INTRAVENOUS | Status: DC
  Administered 2017-07-25 (×2): via INTRAVENOUS

## 2017-07-25 MED ORDER — LIDOCAINE HCL (PF) 2 % IJ SOLN
INTRAMUSCULAR | Status: DC | PRN
  Administered 2017-07-25: 100 mg via INTRADERMAL

## 2017-07-25 MED ORDER — PROPOFOL 500 MG/50ML IV EMUL
INTRAVENOUS | Status: AC
  Filled 2017-07-25: qty 50

## 2017-07-25 MED ORDER — PROPOFOL 10 MG/ML IV BOLUS
INTRAVENOUS | Status: DC | PRN
  Administered 2017-07-25: 70 mg via INTRAVENOUS

## 2017-07-25 MED ORDER — RANITIDINE HCL 150 MG PO TABS
150.0000 mg | ORAL_TABLET | Freq: Two times a day (BID) | ORAL | 12 refills | Status: DC
Start: 2017-07-25 — End: 2017-08-02

## 2017-07-25 MED ORDER — PROPOFOL 500 MG/50ML IV EMUL
INTRAVENOUS | Status: DC | PRN
  Administered 2017-07-25: 140 ug/kg/min via INTRAVENOUS

## 2017-07-25 NOTE — Anesthesia Procedure Notes (Signed)
Date/Time: 07/25/2017 8:24 AM Performed by: Nelda Marseille, CRNA Pre-anesthesia Checklist: Patient identified, Emergency Drugs available, Suction available, Patient being monitored and Timeout performed Oxygen Delivery Method: Nasal cannula

## 2017-07-25 NOTE — Anesthesia Post-op Follow-up Note (Signed)
Anesthesia QCDR form completed.        

## 2017-07-25 NOTE — Anesthesia Postprocedure Evaluation (Signed)
Anesthesia Post Note  Patient: Maureen [name]  Procedure(s) Performed: ESOPHAGOGASTRODUODENOSCOPY (EGD) WITH PROPOFOL (N/A )  Patient location during evaluation: PACU Anesthesia Type: General Level of consciousness: awake and alert Pain management: pain level controlled Vital Signs Assessment: post-procedure vital signs reviewed and stable Respiratory status: spontaneous breathing, nonlabored ventilation, respiratory function stable and patient connected to nasal cannula oxygen Cardiovascular status: blood pressure returned to baseline and stable Postop Assessment: no apparent nausea or vomiting Anesthetic complications: no     Last Vitals:  Vitals:   07/25/17 0855 07/25/17 0905  BP: 116/78 (!) 109/55  Pulse: 64 62  Resp: 15 13  Temp:    SpO2: 100% 99%    Last Pain:  Vitals:   07/25/17 0905  TempSrc:   PainSc: 0-No pain                 Molli Barrows

## 2017-07-25 NOTE — Op Note (Signed)
[facility] Gastroenterology Patient Name: Maureen [name] Procedure Date: 07/25/2017 8:09 AM MRN: [id number] Account #: [id number] Date of Birth: Jan 28, 1946 Admit Type: Outpatient Age: 72 Room: [facility] ENDO ROOM 1 Gender: Female Note Status: Finalized Procedure:            Upper GI endoscopy Indications:          Epigastric abdominal pain, Abdominal pain in the right                        upper quadrant Providers:            [name], MD Referring MD:         Valerie Roys (Referring MD) Medicines:            Monitored Anesthesia Care Complications:        No immediate complications. Procedure:            Pre-Anesthesia Assessment:                       - Prior to the procedure, a History and Physical was                        performed, and patient medications, allergies and                        sensitivities were reviewed. The patient's tolerance of                        previous anesthesia was reviewed.                       - The risks and benefits of the procedure and the                        sedation options and risks were discussed with the                        patient. All questions were answered and informed                        consent was obtained.                       After obtaining informed consent, the endoscope was                        passed under direct vision. Throughout the procedure,                        the patient's blood pressure, pulse, and oxygen                        saturations were monitored continuously. The Endoscope                        was introduced through the mouth, and advanced to the                        third part of duodenum. The upper GI endoscopy was  accomplished without difficulty. The patient tolerated                        the procedure well. Findings:      Scattered islands of salmon-colored mucosa were present at 15 cm. No       other visible abnormalities were  present. The maximum longitudinal       extent of these esophageal mucosal changes was 2 cm in length. Biopsies       were taken with a cold forceps for histology.      A single 6 mm polyp with no bleeding was found 18 cm from the incisors.       Biopsies were taken with a cold forceps for histology. Removed       completely.      A single 10 mm sessile polyp with no bleeding and no stigmata of recent       bleeding was found at the gastroesophageal junction. Biopsies were taken       with a cold forceps for histology.      A small hiatal hernia was present.      Diffuse minimal inflammation was found in the prepyloric region of the       stomach. This was biopsied with a cold forceps for histology.      The examined duodenum was normal. Impression:           - Salmon-colored mucosa suspicious for short-segment                        Barrett's esophagus. Biopsied.                       - Esophageal polyp(s) were found. Biopsied.                       - A single gastroesophageal junction polyp. Biopsied.                       - Small hiatal hernia.                       - Chronic gastritis. Biopsied.                       - Normal examined duodenum. Recommendation:       - Return to endoscopist in 1 week. Procedure Code(s):    --- Professional ---                       [phone], Esophagogastroduodenoscopy, flexible, transoral;                        with biopsy, single or multiple Diagnosis Code(s):    --- Professional ---                       K22.8, Other specified diseases of esophagus                       K31.7, Polyp of stomach and duodenum                       K44.9, Diaphragmatic hernia without obstruction or  gangrene                       K29.50, Unspecified chronic gastritis without bleeding                       R10.13, Epigastric pain                       R10.11, Right upper quadrant pain CPT copyright 2017 American Medical Association. All rights  reserved. The codes documented in this report are preliminary and upon coder review may  be revised to meet current compliance requirements. [name], MD 07/25/2017 8:36:20 AM This report has been signed electronically. Number of Addenda: 0 Note Initiated On: 07/25/2017 8:09 AM      [facility]

## 2017-07-25 NOTE — Transfer of Care (Signed)
Immediate Anesthesia Transfer of Care Note  Patient: Maureen [name]  Procedure(s) Performed: ESOPHAGOGASTRODUODENOSCOPY (EGD) WITH PROPOFOL (N/A )  Patient Location: PACU  Anesthesia Type:General  Level of Consciousness: awake and sedated  Airway & Oxygen Therapy: Patient Spontanous Breathing and Patient connected to nasal cannula oxygen  Post-op Assessment: Report given to RN and Post -op Vital signs reviewed and stable  Post vital signs: Reviewed and stable  Last Vitals:  Vitals Value Taken Time  BP 89/49 07/25/2017  8:36 AM  Temp 36.4 C 07/25/2017  8:36 AM  Pulse 64 07/25/2017  8:36 AM  Resp 18 07/25/2017  8:36 AM  SpO2 98 % 07/25/2017  8:36 AM    Last Pain:  Vitals:   07/25/17 0836  TempSrc:   PainSc: Asleep         Complications: No apparent anesthesia complications

## 2017-07-25 NOTE — Anesthesia Preprocedure Evaluation (Signed)
Anesthesia Evaluation  Patient identified by MRN, date of birth, ID band Patient awake    Reviewed: Allergy & Precautions, H&P , NPO status , Patient's Chart, lab work & pertinent test results, reviewed documented beta blocker date and time   Airway Mallampati: II   Neck ROM: full    Dental  (+) Poor Dentition   Pulmonary neg pulmonary ROS, Current Smoker,    Pulmonary exam normal        Cardiovascular negative cardio ROS Normal cardiovascular exam+ Valvular Problems/Murmurs  Rhythm:regular Rate:Normal     Neuro/Psych PSYCHIATRIC DISORDERS Anxiety negative neurological ROS  negative psych ROS   GI/Hepatic negative GI ROS, Neg liver ROS, PUD, GERD  ,  Endo/Other  negative endocrine ROS  Renal/GU negative Renal ROS  negative genitourinary   Musculoskeletal   Abdominal   Peds  Hematology negative hematology ROS (+)   Anesthesia Other Findings Past Medical History: 2005: Arthritis 2011: Breast cancer, left breast [facility])     Comment:  Left simple mastectomy completed on February 07, 2010               for DCIS. This was a histologic grade 2 tumor measuring               just under 1 cm in diameter. No invasive cancer was               identified. ER 90%, PR 40%. 1994: Breast cancer, right breast ([place])     Comment:  Invasive ductal carcinoma.The patient underwent right               mastectomy in 1994 for invasive ductal carcinoma. 2005: Discoid lupus No date: GERD (gastroesophageal reflux disease) 2005: Heart murmur 2012: Hypercholesterolemia 2011: Mammographic microcalcification No date: Panic attack No date: Personal history of colonic polyps     Comment:  Colon polyps No date: Personal history of tobacco use, presenting hazards to health No date: PUD (peptic ulcer disease)     Comment:  Last EGD 1/09 2008: Rectal polyp     Comment:  villous adenoma; last colonoscopy 1/09 WNL 2015: Skin cancer, basal cell    Comment:  right cheek Mohs surgery at [facility] No date: Ulcer Past Surgical History: 2011: APPENDECTOMY 12/27/2009: BREAST BIOPSY     Comment:  right breast 1996: CARDIAC CATHETERIZATION     Comment:  Negative Cath 08/05/2014: COLONOSCOPY; N/A     Comment:  Procedure: COLONOSCOPY;  Surgeon: [name], MD;              Location: [facility] ENDOSCOPY;  EGD, tubular adenoma 1. 4098,1191: COLONOSCOPY W/ BIOPSIES     Comment:  Dr. Bary Castilla. Normal exam 2009. Villous adenoma of the               rectum in 2005 adenomatous polyp from the ascending colon              and rectum in 2002. 08/05/2014: ESOPHAGOGASTRODUODENOSCOPY; N/A     Comment:  Procedure: ESOPHAGOGASTRODUODENOSCOPY (EGD);  Surgeon:               [name], MD;  Location: [facility] ENDOSCOPY;                Service: Endoscopy;  Laterality: N/A; No date: MASTECTOMY     Comment:  bilateral 1993: SIMPLE MASTECTOMY     Comment:  right 2011: SIMPLE MASTECTOMY     Comment:  left 1997: TONSILLECTOMY 2003, 2005, 2009, 2012, 2014: UPPER  GI ENDOSCOPY     Comment:  2012 showed mild reflux changes without evidence of               Barrett's epithelial changes 2014 biopsies at 37 cm               showed changes suggestive of reflux esophagitis. No               metaplasia. 1977: VAGINAL HYSTERECTOMY BMI    Body Mass Index:  24.80 kg/m     Reproductive/Obstetrics negative OB ROS                             Anesthesia Physical Anesthesia Plan  ASA: III  Anesthesia Plan: General   Post-op Pain Management:    Induction:   PONV Risk Score and Plan:   Airway Management Planned:   Additional Equipment:   Intra-op Plan:   Post-operative Plan:   Informed Consent: I have reviewed the patients History and Physical, chart, labs and discussed the procedure including the risks, benefits and alternatives for the proposed anesthesia with the patient or authorized representative who has  indicated his/her understanding and acceptance.   Dental Advisory Given  Plan Discussed with: CRNA  Anesthesia Plan Comments:         Anesthesia Quick Evaluation

## 2017-07-25 NOTE — H&P (Signed)
Maureen [name] [id number] 1945/04/15     HPI:   Abdominal pain post prandial somewhat improved with Bentyl, marked improvement in post prandial diarrhea.  Salads still causes diarrhea. U/S: Neg. HIDA w/ stim: 74%. Patient reported fullness with some pain and diarrhea after Ensure.    Medications Prior to Admission  Medication Sig Dispense Refill Last Dose  . albuterol (PROVENTIL HFA;VENTOLIN HFA) 108 (90 Base) MCG/ACT inhaler TAKE 2 PUFFS BY MOUTH EVERY 6 HOURS AS NEEDED FOR WHEEZE OR SHORTNESS OF BREATH 18 Inhaler 3 Past Week at Unknown time  . ALPRAZolam (XANAX) 0.5 MG tablet TAKE 1/2 TO 1 TABLET BY MOUTH 2 TIMES A DAY AS NEEDED FOR ANXIETY 60 tablet 2 07/25/2017 at 0630  . budesonide-formoterol (SYMBICORT) 160-4.5 MCG/ACT inhaler Inhale 2 puffs into the lungs 2 (two) times daily. 1 Inhaler 6 07/24/2017 at 2100  . RA KRILL OIL 500 MG CAPS Take 500 mg by mouth daily.   07/24/2017 at Unknown time  . zinc gluconate 50 MG tablet Take 50 mg by mouth every other day.   07/24/2017 at Unknown time  . acetaminophen (TYLENOL) 500 MG tablet Take 500 mg by mouth every 6 (six) hours as needed for headache.   Taking  . benzonatate (TESSALON) 200 MG capsule Take 1 capsule (200 mg total) by mouth 2 (two) times daily as needed for cough. 20 capsule 0 Taking  . clobetasol cream (TEMOVATE) 9.56 % Apply 1 application topically daily.   Taking  . dicyclomine (BENTYL) 10 MG capsule Take 1 capsule (10 mg total) by mouth 3 (three) times daily before meals for 10 days. Take 30 minutes prior to meal 30 capsule 0   . Homeopathic Products (ZICAM ALLERGY RELIEF NA) Place 2 sprays into the nose daily as needed (allergies).    Taking  . omeprazole (PRILOSEC) 20 MG capsule Take 1 capsule (20 mg total) by mouth daily as needed (acid reflux). 90 capsule 4 Taking   Allergies  Allergen Reactions  . Codeine Itching  . Sulfa Antibiotics Hives  . Sulfinpyrazone   . Erythromycin Rash  . Penicillins Rash   Past Medical History:   Diagnosis Date  . Arthritis 2005  . 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 This was a histologic grade 2 tumor measuring just under 1 cm in diameter. No invasive cancer was identified. ER 90%, PR 40%.  . Breast cancer, right breast ([place]) 1994   Invasive ductal carcinoma.The patient underwent right mastectomy in 1994 for invasive ductal carcinoma.  . Discoid lupus 2005  . GERD (gastroesophageal reflux disease)   . Heart murmur 2005  . Hypercholesterolemia 2012  . Mammographic microcalcification 2011  . Panic attack   . Personal history of colonic polyps    Colon polyps  . Personal history of tobacco use, presenting hazards to health   . PUD (peptic ulcer disease)    Last EGD 1/09  . Rectal polyp 2008   villous adenoma; last colonoscopy 1/09 WNL  . Skin cancer, basal cell 2015   right cheek Mohs surgery at [facility]  . Ulcer    Past Surgical History:  Procedure Laterality Date  . APPENDECTOMY  2011  . BREAST BIOPSY  12/27/2009   right breast  . CARDIAC CATHETERIZATION  1996   Negative Cath  . COLONOSCOPY N/A 08/05/2014   Procedure: COLONOSCOPY;  Surgeon: [name], MD;  Location: [facility] ENDOSCOPY;  EGD, tubular adenoma 1.  . COLONOSCOPY  W/ BIOPSIES  [id number]   Dr. Bary Castilla. Normal exam 2009. Villous adenoma of the rectum in 2005 adenomatous polyp from the ascending colon and rectum in 2002.  . ESOPHAGOGASTRODUODENOSCOPY N/A 08/05/2014   Procedure: ESOPHAGOGASTRODUODENOSCOPY (EGD);  Surgeon: [name], MD;  Location: [facility] ENDOSCOPY;  Service: Endoscopy;  Laterality: N/A;  . MASTECTOMY     bilateral  . SIMPLE MASTECTOMY  1993   right  . SIMPLE MASTECTOMY  2011   left  . TONSILLECTOMY  1997  . UPPER GI ENDOSCOPY  2003, 2005, 2009, 2012, 2014   2012 showed mild reflux changes without evidence of Barrett's epithelial changes 2014 biopsies at 37 cm showed changes suggestive of reflux esophagitis.  No metaplasia.  Marland Kitchen VAGINAL HYSTERECTOMY  1977   Social History   Socioeconomic History  . Marital status: Widowed    Spouse name: Not on file  . Number of children: Not on file  . Years of education: Not on file  . Highest education level: Not on file  Occupational History  . 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 Financial resource strain: Not on file  . Food insecurity:    Worry: Not on file    Inability: Not on file  . Transportation needs:    Medical: Not on file    Non-medical: Not on file  Tobacco Use  . Smoking status: Current Every Day Smoker    Packs/day: 1.00    Years: 40.00    Pack years: 40.00    Types: Cigarettes  . Smokeless tobacco: Never Used  . Tobacco comment: Pt wants to do hypnotizing  Substance and Sexual Activity  . Alcohol use: Yes    Alcohol/week: 0.6 - 1.2 oz    Types: 1 - 2 Standard drinks or equivalent per week  . Drug use: No  . Sexual activity: Not Currently  Lifestyle  . Physical activity:    Days per week: Not on file    Minutes per session: Not on file  . Stress: Not on file  Relationships  . Social connections:    Talks on phone: Not on file    Gets together: Not on file    Attends religious service: Not on file    Active member of club or organization: Not on file    Attends meetings of clubs or organizations: Not on file    Relationship status: Not on file  . Intimate partner violence:    Fear of current or ex partner: Not on file    Emotionally abused: Not on file    Physically abused: Not on file    Forced sexual activity: Not on file  Other Topics Concern  . Not on file  Social History Narrative   Lakeysha grew up in [place]. She has two adult daughters. She lives in her home. Her youngest daughter and grandchildren live with her. She has two dogs. 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 She loves  watching her grandson play basketball. She also loves gardening and being outdoors and working in the yard.    Social History   Social History Narrative   Allyah grew up in [place]. She has two adult daughters. She lives in her home. Her youngest daughter and grandchildren live with her. She has two dogs. 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 She loves watching her grandson play  basketball. She also loves gardening and being outdoors and working in the yard.      ROS: Negative.     PE: HEENT: Negative. Lungs: Clear. Cardio: RR.  Assessment/Plan:  Proceed with planned upper endoscopy.  Forest Gleason [facility] 07/25/2017

## 2017-07-26 ENCOUNTER — Encounter: Payer: Self-pay | Admitting: General Surgery

## 2017-07-27 LAB — SURGICAL PATHOLOGY

## 2017-07-30 ENCOUNTER — Telehealth: Payer: Self-pay

## 2017-07-30 NOTE — Telephone Encounter (Signed)
Notified patient as instructed, patient pleased. Discussed follow-up appointments, patient agrees  

## 2017-07-30 NOTE — Telephone Encounter (Signed)
-----   Message from [name], MD sent at 07/30/2017  9:52 AM EDT ----- Please notify the patient all of her biopsies were fine, I do want her to come in to review the details of her exam.  Thank you ----- Message ----- From: Interface, Lab In Three Zero One Sent: 07/27/2017   4:57 PM To: [name], MD

## 2017-08-02 ENCOUNTER — Ambulatory Visit (INDEPENDENT_AMBULATORY_CARE_PROVIDER_SITE_OTHER): Payer: Managed Care, Other (non HMO) | Admitting: General Surgery

## 2017-08-02 ENCOUNTER — Encounter: Payer: Self-pay | Admitting: General Surgery

## 2017-08-02 VITALS — BP 124/62 | HR 79 | Resp 14 | Ht 63.0 in | Wt 142.0 lb

## 2017-08-02 DIAGNOSIS — R1011 Right upper quadrant pain: Secondary | ICD-10-CM

## 2017-08-02 NOTE — Patient Instructions (Signed)
The patient is aware to call back for any questions or concerns.  

## 2017-08-02 NOTE — Progress Notes (Signed)
Patient ID: Maureen [name], female   DOB: 09/06/1945, 72 y.o.   MRN: [id number]  Chief Complaint  Patient presents with  . Follow-up    HPI Maureen [name] is a 72 y.o. female.  Here for her follow up right upper quadrant pain.Marland Kitchen  HPI  Past Medical History:  Diagnosis Date  . Arthritis 2005  . 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 This was a histologic grade 2 tumor measuring just under 1 cm in diameter. No invasive cancer was identified. ER 90%, PR 40%.  . Breast cancer, right breast ([place]) 1994   Invasive ductal carcinoma.The patient underwent right mastectomy in 1994 for invasive ductal carcinoma.  . Discoid lupus 2005  . GERD (gastroesophageal reflux disease)   . Heart murmur 2005  . Hypercholesterolemia 2012  . Mammographic microcalcification 2011  . Panic attack   . Personal history of colonic polyps    Colon polyps  . Personal history of tobacco use, presenting hazards to health   . PUD (peptic ulcer disease)    Last EGD 1/09  . Rectal polyp 2008   villous adenoma; last colonoscopy 1/09 WNL  . Skin cancer, basal cell 2015   right cheek Mohs surgery at [facility]  . Ulcer     Past Surgical History:  Procedure Laterality Date  . APPENDECTOMY  2011  . BREAST BIOPSY  12/27/2009   right breast  . CARDIAC CATHETERIZATION  1996   Negative Cath  . COLONOSCOPY N/A 08/05/2014   Procedure: COLONOSCOPY;  Surgeon: [name], MD;  Location: [facility] ENDOSCOPY;  EGD, tubular adenoma 1.  . COLONOSCOPY W/ BIOPSIES  2005,2009   Dr. Bary Castilla. Normal exam 2009. Villous adenoma of the rectum in 2005 adenomatous polyp from the ascending colon and rectum in 2002.  . ESOPHAGOGASTRODUODENOSCOPY N/A 08/05/2014   Procedure: ESOPHAGOGASTRODUODENOSCOPY (EGD);  Surgeon: [name], MD;  Location: [facility] ENDOSCOPY;  Service: Endoscopy;  Laterality: N/A;  . ESOPHAGOGASTRODUODENOSCOPY (EGD) WITH PROPOFOL N/A 07/25/2017    Procedure: ESOPHAGOGASTRODUODENOSCOPY (EGD) WITH PROPOFOL;  Surgeon: [name], MD;  Location: [facility] ENDOSCOPY;  Service: Endoscopy;  Laterality: N/A;  . MASTECTOMY     bilateral  . SIMPLE MASTECTOMY  1993   right  . SIMPLE MASTECTOMY  2011   left  . TONSILLECTOMY  1997  . UPPER GI ENDOSCOPY  2003, 2005, 2009, 2012, 2014   2012 showed mild reflux changes without evidence of Barrett's epithelial changes 2014 biopsies at 37 cm showed changes suggestive of reflux esophagitis. No metaplasia.  Marland Kitchen VAGINAL HYSTERECTOMY  1977    Family History  Problem Relation Age of Onset  . Cancer Father        esophagus  . Cancer Maternal Aunt        breast cancer  . Cancer Maternal Grandmother        breast cancer    Social History Social History   Tobacco Use  . Smoking status: Current Every Day Smoker    Packs/day: 1.00    Years: 40.00    Pack years: 40.00    Types: Cigarettes  . Smokeless tobacco: Never Used  . Tobacco comment: Pt wants to do hypnotizing  Substance Use Topics  . Alcohol use: Yes    Alcohol/week: 0.6 - 1.2 oz    Types: 1 - 2 Standard drinks or equivalent per week  . Drug use: No    Allergies  Allergen Reactions  . Codeine  Itching  . Sulfa Antibiotics Hives  . Sulfinpyrazone   . Erythromycin Rash  . Penicillins Rash    Current Outpatient Medications  Medication Sig Dispense Refill  . acetaminophen (TYLENOL) 500 MG tablet Take 500 mg by mouth every 6 (six) hours as needed for headache.    . albuterol (PROVENTIL HFA;VENTOLIN HFA) 108 (90 Base) MCG/ACT inhaler TAKE 2 PUFFS BY MOUTH EVERY 6 HOURS AS NEEDED FOR WHEEZE OR SHORTNESS OF BREATH 18 Inhaler 3  . ALPRAZolam (XANAX) 0.5 MG tablet TAKE 1/2 TO 1 TABLET BY MOUTH 2 TIMES A DAY AS NEEDED FOR ANXIETY 60 tablet 2  . benzonatate (TESSALON) 200 MG capsule Take 1 capsule (200 mg total) by mouth 2 (two) times daily as needed for cough. 20 capsule 0  . budesonide-formoterol (SYMBICORT) 160-4.5 MCG/ACT inhaler  Inhale 2 puffs into the lungs 2 (two) times daily. 1 Inhaler 6  . clobetasol cream (TEMOVATE) 0.35 % Apply 1 application topically daily.    . Homeopathic Products (ZICAM ALLERGY RELIEF NA) Place 2 sprays into the nose daily as needed (allergies).     Marland Kitchen omeprazole (PRILOSEC) 20 MG capsule Take 20 mg by mouth daily.    Marland Kitchen RA KRILL OIL 500 MG CAPS Take 500 mg by mouth daily.    Marland Kitchen zinc gluconate 50 MG tablet Take 50 mg by mouth every other day.     No current facility-administered medications for this visit.     Review of Systems Review of Systems  Constitutional: Negative.   Respiratory: Negative.   Cardiovascular: Negative.     Blood pressure 124/62, pulse 79, resp. rate 14, height 5' 3"  (1.6 m), weight 142 lb (64.4 kg), SpO2 95 %.  Physical Exam Physical Exam  Constitutional: She is oriented to person, place, and time. She appears well-developed and well-nourished.  Neurological: She is alert and oriented to person, place, and time.  Skin: Skin is warm and dry.  Psychiatric: Her behavior is normal.    Data Reviewed Abdominal ultrasound of June 22, 2017 showed fatty infiltration of the liver. HIDA scan with stimulation dated July 17, 2017 showed an ejection fraction of 74%.  The patient denied symptoms with stimulation.  Upper endoscopy showed multiple findings, biopsy results: A. STOMACH, ANTRUM; COLD BIOPSY:  - MILD REACTIVE GASTROPATHY.  - NEGATIVE FOR ACTIVE INFLAMMATION, INTESTINAL METAPLASIA, DYSPLASIA,  AND MALIGNANCY.  - NEGATIVE FOR HELICOBACTER PYLORI IN HEMATOXYLIN AND EOSIN SECTIONS.   B. GASTROESOPHAGEAL JUNCTION POLYP X 1; COLD BIOPSY:  - SQUAMOCOLUMNAR MUCOSA WITH ACTIVE INFLAMMATION CONSISTENT WITH REFLUX  ESOPHAGITIS.  - REACTIVE FOVEOLAR HYPERPLASIA.  - NEGATIVE FOR GOBLET CELLS, DYSPLASIA, AND MALIGNANCY.   C. ESOPHAGUS, 17 CM; COLD BIOPSY:  - SQUAMOUS PAPILLOMA.  - NEGATIVE FOR DYSPLASIA AND MALIGNANCY.   D. ESOPHAGUS, 15 CM; COLD BIOPSY:  -  HETEROTOPIC GASTRIC MUCOSA CONSISTENT WITH INLET PATCH.  - NEGATIVE FOR GOBLET CELLS, DYSPLASIA AND MALIGNANCY.   CBC of June 25, 2017 was normal.  Comprehensive metabolic will the same date was notable for a creatinine of 1.07 with a decreased EGFR of 52.   Assessment    No clear etiology for her abdominal discomfort.    Plan    Observation alone at present.  Patient report any significant change.     HPI, Physical Exam, Assessment and Plan have been scribed under the direction and in the presence of [name], MD. Karie Fetch, RN  I have completed the exam and reviewed the above documentation for accuracy and completeness.  I agree with the above.  Haematologist has been used and any errors in dictation or transcription are unintentional.  Hervey Ard, M.D., F.A.C.S.   Maureen [name] 08/02/2017, 9:36 PM

## 2017-08-07 ENCOUNTER — Encounter: Payer: Self-pay | Admitting: Family Medicine

## 2017-08-07 ENCOUNTER — Ambulatory Visit: Payer: Managed Care, Other (non HMO) | Admitting: Family Medicine

## 2017-08-07 VITALS — BP 120/74 | HR 71 | Temp 97.6°F | Wt 142.4 lb

## 2017-08-07 DIAGNOSIS — F411 Generalized anxiety disorder: Secondary | ICD-10-CM | POA: Diagnosis not present

## 2017-08-07 NOTE — Assessment & Plan Note (Signed)
Stable on current regimen. Does not need refill right now. Will call when she does. Follow up September. Call with any concerns.

## 2017-08-07 NOTE — Progress Notes (Signed)
BP 120/74 (BP Location: Right Arm, Patient Position: Sitting, Cuff Size: Normal)   Pulse 71   Temp 97.6 F (36.4 C)   Wt 142 lb 7 oz (64.6 kg)   SpO2 97%   BMI 25.23 kg/m    Subjective:    Patient ID: [name], female    DOB: 1945-12-04, 72 y.o.   MRN: [id number]  HPI: LEN KLUVER is a 72 y.o. female  Chief Complaint  Patient presents with  . Anxiety   ANXIETY/STRESS- doesn't need refill on her medicine right now. Only taking her medicine at night time, has been taking a couple more because of  Duration:exacerbated Anxious mood: yes  Excessive worrying: yes Irritability: no  Sweating: no Nausea: no Palpitations:no Hyperventilation: no Panic attacks: no Agoraphobia: no  Obscessions/compulsions: yes Depressed mood: yes Depression screen [facility] 2/9 08/07/2017 06/05/2017 04/19/2017 01/05/2017 08/25/2016  Decreased Interest 0 1 0 0 0  Down, Depressed, Hopeless 1 1 0 0 1  PHQ - 2 Score 1 2 0 0 1  Altered sleeping 1 1 0 - 0  Tired, decreased energy 0 1 0 - 0  Change in appetite 1 1 0 - 0  Feeling bad or failure about yourself  0 0 0 - 0  Trouble concentrating 0 0 0 - 0  Moving slowly or fidgety/restless 0 0 0 - 0  Suicidal thoughts 0 0 0 - 0  PHQ-9 Score 3 5 0 - 1  Difficult doing work/chores Somewhat difficult - - - -   Anhedonia: no Weight changes: no Insomnia: no   Hypersomnia: no Fatigue/loss of energy: no Feelings of worthlessness: no Feelings of guilt: no Impaired concentration/indecisiveness: no Suicidal ideations: no  Crying spells: no Recent Stressors/Life Changes: yes   Relationship problems: no   Family stress: yes     Financial stress: yes    Job stress: yes    Recent death/loss: yes   Relevant past medical, surgical, family and social history reviewed and updated as indicated. Interim medical history since our last visit reviewed. Allergies and medications reviewed and updated.  Review of Systems  Constitutional: Negative.     Respiratory: Negative.   Cardiovascular: Negative.   Neurological: Negative.   Psychiatric/Behavioral: Negative for agitation, behavioral problems, confusion, decreased concentration, dysphoric mood, hallucinations, self-injury, sleep disturbance and suicidal ideas. The patient is nervous/anxious. The patient is not hyperactive.     Per HPI unless specifically indicated above     Objective:    BP 120/74 (BP Location: Right Arm, Patient Position: Sitting, Cuff Size: Normal)   Pulse 71   Temp 97.6 F (36.4 C)   Wt 142 lb 7 oz (64.6 kg)   SpO2 97%   BMI 25.23 kg/m   Wt Readings from Last 3 Encounters:  08/07/17 142 lb 7 oz (64.6 kg)  08/02/17 142 lb (64.4 kg)  07/25/17 140 lb (63.5 kg)    Physical Exam  Constitutional: She is oriented to person, place, and time. She appears well-developed and well-nourished. No distress.  HENT:  Head: Normocephalic and atraumatic.  Right Ear: Hearing normal.  Left Ear: Hearing normal.  Nose: Nose normal.  Eyes: Conjunctivae and lids are normal. Right eye exhibits no discharge. Left eye exhibits no discharge. No scleral icterus.  Cardiovascular: Normal rate, regular rhythm, normal heart sounds and intact distal pulses. Exam reveals no gallop and no friction rub.  No murmur heard. Pulmonary/Chest: Effort normal and breath sounds normal. No stridor. No respiratory distress. She has no wheezes. She  has no rales. She exhibits no tenderness.  Musculoskeletal: Normal range of motion.  Neurological: She is alert and oriented to person, place, and time.  Skin: Skin is warm, dry and intact. Capillary refill takes less than 2 seconds. No rash noted. She is not diaphoretic. No erythema. No pallor.  Psychiatric: She has a normal mood and affect. Her speech is normal and behavior is normal. Judgment and thought content normal. Cognition and memory are normal.  Nursing note and vitals reviewed.   Results for orders placed or performed during the hospital  encounter of 07/25/17  Surgical pathology  Result Value Ref Range   SURGICAL PATHOLOGY      Surgical Pathology CASE: [id number] PATIENT: [name] Surgical Pathology Report     SPECIMEN SUBMITTED: A. Stomach, antrum; cbx B. GEJ polyp x1; cbx C. Esophagus, 17 cm; cbx D. Esophagus, 15 cm, possible Barretts; cbx  CLINICAL HISTORY: None provided  PRE-OPERATIVE DIAGNOSIS: RUQ ABD pain, epigastric pain  POST-OPERATIVE DIAGNOSIS: Hiatal hernia, gastritis, gastric polyp, esophageal polyp, possible Barrett's esophagus     DIAGNOSIS: A.  STOMACH, ANTRUM; COLD BIOPSY: - MILD REACTIVE GASTROPATHY. - NEGATIVE FOR ACTIVE INFLAMMATION, INTESTINAL METAPLASIA, DYSPLASIA, AND MALIGNANCY. - NEGATIVE FOR HELICOBACTER PYLORI IN HEMATOXYLIN AND EOSIN SECTIONS.  B.  GASTROESOPHAGEAL JUNCTION POLYP X 1; COLD BIOPSY: - SQUAMOCOLUMNAR MUCOSA WITH ACTIVE INFLAMMATION CONSISTENT WITH REFLUX ESOPHAGITIS. - REACTIVE FOVEOLAR HYPERPLASIA. - NEGATIVE FOR GOBLET CELLS, DYSPLASIA, AND MALIGNANCY.  C.  ESOPHAGUS, 17 CM; COLD BIOPSY: - SQUAMOUS PAPILLOMA. - NEGA TIVE FOR DYSPLASIA AND MALIGNANCY.  D.  ESOPHAGUS, 15 CM; COLD BIOPSY: - HETEROTOPIC GASTRIC MUCOSA CONSISTENT WITH INLET PATCH. - NEGATIVE FOR GOBLET CELLS, DYSPLASIA AND MALIGNANCY.   GROSS DESCRIPTION: A. Labeled: Gastric antrum cbx Received: In formalin Tissue fragment(s): 3 Size: 0.3-0.5 cm Description: Pink-Tan fragments Entirely submitted in one cassette.  B. Labeled: GEJ polyp cbx x1 Received: In formalin Tissue fragment(s): 3 Size: 0.4-0.5 cm Description: Pink-Tan fragments Entirely submitted in one cassette.  C. Labeled: Esophagus at 17 cm cbx Received: In formalin Tissue fragment(s): 1 Size: 0.4 cm Description: Tan fragment Entirely submitted in one cassette.  D. Labeled: Esophagus at 15 cm cbx possible Barrett's Received: In formalin Tissue fragment(s): 3 Size: 0.3-0.5 cm Description: Pink-Tan  fragments Entirely submitted in one cassette.   Final Diagnosis performed by Bryan Lemma, MD.   Electronically signed 07/27/2017 4:41:58PM The ele ctronic signature indicates that the named Attending Pathologist has evaluated the specimen  Technical component performed at [facility], [address] [place], [place] [address] Lab: [phone] Dir: Rush Farmer, MD, MMM  Professional component performed at [facility], [facility], [place], [place], [place] [address] Lab: [phone] Dir: Dellia Nims. Reuel Derby, MD       Assessment & Plan:   Problem List Items Addressed This Visit      Other   Generalized anxiety disorder - Primary    Stable on current regimen. Does not need refill right now. Will call when she does. Follow up September. Call with any concerns.           Follow up plan: Return September.

## 2017-08-14 ENCOUNTER — Telehealth: Payer: Self-pay | Admitting: *Deleted

## 2017-08-14 NOTE — Telephone Encounter (Signed)
-----  Message from Georgana Curio sent at 08/10/2017 11:25 AM EDT ----- Regarding: FW: BRCA testing See Byrnett's message below.  ----- Message ----- From: [name], MD Sent: 08/10/2017  10:10 AM To: Georgana Curio Subject: RE: BRCA testing                               That would be fine to have her tested.   Rosann Auerbach can take care of it.  I would not recommend that the girls be tested before [facility] [place].  ----- Message ----- From: Georgana Curio Sent: 08/10/2017   9:28 AM To: Carson Myrtle, RN, [name], MD Subject: BRCA testing                                   Kierstyn called today to ask if it is possible for her to have the genetic testing for the breast cancer genes.  Both of her daughters have now had issues with breast cysts, and their GYN has recommended BRCA testing for them.  Her current insurance is [employer].  Please call Sharifa to let her know if we can take care of this for her.  She did not think that this had been done in the past.

## 2017-08-14 NOTE — Telephone Encounter (Signed)
Notified patient as instructed, appointment made.

## 2017-08-15 ENCOUNTER — Ambulatory Visit (INDEPENDENT_AMBULATORY_CARE_PROVIDER_SITE_OTHER): Payer: Managed Care, Other (non HMO) | Admitting: *Deleted

## 2017-08-15 DIAGNOSIS — Z853 Personal history of malignant neoplasm of breast: Secondary | ICD-10-CM

## 2017-08-15 NOTE — Patient Instructions (Signed)
The patient is aware to call back for any questions or concerns.  

## 2017-08-15 NOTE — Progress Notes (Signed)
Patient ID: Maureen [name], female   DOB: 1945-06-25, 72 y.o.   MRN: [id number]  Genetic testing completed.

## 2017-08-21 ENCOUNTER — Telehealth: Payer: Self-pay | Admitting: General Surgery

## 2017-08-21 NOTE — Telephone Encounter (Signed)
The patient was notified that her recently completed genetic testing was normal.  Her daughters should not have testing completed as the likelihood of it abnormal gene is incredibly small coming from their father.

## 2017-08-23 ENCOUNTER — Encounter: Payer: Self-pay | Admitting: General Surgery

## 2017-09-06 ENCOUNTER — Telehealth: Payer: Self-pay | Admitting: General Surgery

## 2017-09-06 NOTE — Telephone Encounter (Signed)
Patient called & would like for Rosann Auerbach to return her call. Please call [phone].She did not give a resaon.

## 2017-09-06 NOTE — Telephone Encounter (Signed)
Discussed genetic testing results.

## 2017-09-29 ENCOUNTER — Other Ambulatory Visit: Payer: Self-pay | Admitting: Family Medicine

## 2017-09-29 DIAGNOSIS — F411 Generalized anxiety disorder: Secondary | ICD-10-CM

## 2017-11-28 ENCOUNTER — Other Ambulatory Visit: Payer: Self-pay | Admitting: Family Medicine

## 2017-11-28 DIAGNOSIS — F411 Generalized anxiety disorder: Secondary | ICD-10-CM

## 2017-12-01 ENCOUNTER — Other Ambulatory Visit: Payer: Self-pay | Admitting: Family Medicine

## 2017-12-01 DIAGNOSIS — F411 Generalized anxiety disorder: Secondary | ICD-10-CM

## 2017-12-10 ENCOUNTER — Other Ambulatory Visit: Payer: Self-pay

## 2017-12-10 ENCOUNTER — Ambulatory Visit: Payer: Managed Care, Other (non HMO) | Admitting: Family Medicine

## 2017-12-10 ENCOUNTER — Encounter: Payer: Self-pay | Admitting: Family Medicine

## 2017-12-10 VITALS — BP 115/74 | HR 74 | Temp 97.4°F | Ht 63.0 in | Wt 140.0 lb

## 2017-12-10 DIAGNOSIS — E782 Mixed hyperlipidemia: Secondary | ICD-10-CM

## 2017-12-10 DIAGNOSIS — E559 Vitamin D deficiency, unspecified: Secondary | ICD-10-CM | POA: Diagnosis not present

## 2017-12-10 DIAGNOSIS — F411 Generalized anxiety disorder: Secondary | ICD-10-CM | POA: Diagnosis not present

## 2017-12-10 MED ORDER — BUDESONIDE-FORMOTEROL FUMARATE 160-4.5 MCG/ACT IN AERO: 2.0000 | INHALATION_SPRAY | Freq: Two times a day (BID) | RESPIRATORY_TRACT | 6 refills | Status: DC

## 2017-12-10 MED ORDER — ALPRAZOLAM 0.5 MG PO TABS: 0.2500 mg | ORAL_TABLET | Freq: Two times a day (BID) | ORAL | 0 refills | Status: DC | PRN

## 2017-12-10 MED ORDER — ALBUTEROL SULFATE HFA 108 (90 BASE) MCG/ACT IN AERS: INHALATION_SPRAY | RESPIRATORY_TRACT | 3 refills | Status: DC

## 2017-12-10 MED ORDER — ALPRAZOLAM 0.5 MG PO TABS
0.2500 mg | ORAL_TABLET | Freq: Two times a day (BID) | ORAL | 0 refills | Status: DC | PRN
Start: 2018-02-08 — End: 2017-12-10

## 2017-12-10 MED ORDER — ALPRAZOLAM 0.5 MG PO TABS: 0.2500 mg | ORAL_TABLET | Freq: Two times a day (BID) | ORAL | 0 refills | Status: AC | PRN

## 2017-12-10 NOTE — Progress Notes (Signed)
Rx faxed

## 2017-12-10 NOTE — Assessment & Plan Note (Signed)
Will recheck levels today. Await results. Call with any concerns.  

## 2017-12-10 NOTE — Assessment & Plan Note (Signed)
Not on medication. Will recheck levels today. Await results. Call with any concerns.

## 2017-12-10 NOTE — Assessment & Plan Note (Signed)
Stable on current regimen. Rx for 3 months provided today. Follow up in 3 months. Call with any concerns.

## 2017-12-10 NOTE — Progress Notes (Signed)
BP 115/74   Pulse 74   Temp (!) 97.4 F (36.3 C) (Oral)   Ht 5\' 3"  (1.6 m)   Wt 140 lb (63.5 kg)   SpO2 97%   BMI 24.80 kg/m    Subjective:    Patient ID: Maureen [name], female    DOB: Aug 23, 1945, 72 y.o.   MRN: [id number]  HPI: Maureen [name] is a 72 y.o. female  Chief Complaint  Patient presents with  . Anxiety    f/u   ANXIETY/STRESS Duration:stable Anxious mood: yes  Excessive worrying: no Irritability: no  Sweating: no Nausea: no Palpitations:no Hyperventilation: no Panic attacks: no Agoraphobia: no  Obscessions/compulsions: no Depressed mood: no Depression screen [facility] 2/9 12/10/2017 08/07/2017 06/05/2017 04/19/2017 01/05/2017  Decreased Interest 0 0 1 0 0  Down, Depressed, Hopeless 0 1 1 0 0  PHQ - 2 Score 0 1 2 0 0  Altered sleeping 1 1 1  0 -  Tired, decreased energy 0 0 1 0 -  Change in appetite 0 1 1 0 -  Feeling bad or failure about yourself  0 0 0 0 -  Trouble concentrating 0 0 0 0 -  Moving slowly or fidgety/restless 0 0 0 0 -  Suicidal thoughts 0 0 0 0 -  PHQ-9 Score 1 3 5  0 -  Difficult doing work/chores Not difficult at all Somewhat difficult - - -   Anhedonia: no Weight changes: no Insomnia: no   Hypersomnia: no Fatigue/loss of energy: no Feelings of worthlessness: no Feelings of guilt: no Impaired concentration/indecisiveness: no Suicidal ideations: no  Crying spells: no Recent Stressors/Life Changes: no   Relationship problems: no   Family stress: no     Financial stress: no    Job stress: no    Recent death/loss: no  HYPERLIPIDEMIA Hyperlipidemia status: stable Satisfied with current treatment?  yes Side effects:  yes- on statins, not on anything right now Supplements: fish oil Aspirin:  no The 10-year ASCVD risk score Mikey Bussing DC Jr., et al., 2013) is: 14.5%   Values used to calculate the score:     Age: 71 years     Sex: Female     Is Non-Hispanic African American: No     Diabetic: No     Tobacco smoker: Yes  Systolic Blood Pressure: 496 mmHg     Is BP treated: No     HDL Cholesterol: 45 mg/dL     Total Cholesterol: 236 mg/dL Chest pain:  no   Relevant past medical, surgical, family and social history reviewed and updated as indicated. Interim medical history since our last visit reviewed. Allergies and medications reviewed and updated.  Review of Systems  Constitutional: Negative.   Respiratory: Negative.   Cardiovascular: Negative.   Musculoskeletal: Negative.   Psychiatric/Behavioral: Negative.     Per HPI unless specifically indicated above     Objective:    BP 115/74   Pulse 74   Temp (!) 97.4 F (36.3 C) (Oral)   Ht 5\' 3"  (1.6 m)   Wt 140 lb (63.5 kg)   SpO2 97%   BMI 24.80 kg/m   Wt Readings from Last 3 Encounters:  12/10/17 140 lb (63.5 kg)  08/07/17 142 lb 7 oz (64.6 kg)  08/02/17 142 lb (64.4 kg)    Physical Exam  Constitutional: She is oriented to person, place, and time. She appears well-developed and well-nourished. No distress.  HENT:  Head: Normocephalic and atraumatic.  Right Ear: Hearing normal.  Left Ear:  Hearing normal.  Nose: Nose normal.  Eyes: Conjunctivae and lids are normal. Right eye exhibits no discharge. Left eye exhibits no discharge. No scleral icterus.  Cardiovascular: Normal rate, regular rhythm, normal heart sounds and intact distal pulses. Exam reveals no gallop and no friction rub.  No murmur heard. Pulmonary/Chest: Effort normal and breath sounds normal. No stridor. No respiratory distress. She has no wheezes. She has no rales. She exhibits no tenderness.  Musculoskeletal: Normal range of motion.  Neurological: She is alert and oriented to person, place, and time.  Skin: Skin is warm, dry and intact. Capillary refill takes less than 2 seconds. No rash noted. She is not diaphoretic. No erythema. No pallor.  Psychiatric: She has a normal mood and affect. Her speech is normal and behavior is normal. Judgment and thought content normal.  Cognition and memory are normal.  Nursing note and vitals reviewed.   Results for orders placed or performed during the hospital encounter of 07/25/17  Surgical pathology  Result Value Ref Range   SURGICAL PATHOLOGY      Surgical Pathology CASE: [id number] PATIENT: Maureen [name] Surgical Pathology Report     SPECIMEN SUBMITTED: A. Stomach, antrum; cbx B. GEJ polyp x1; cbx C. Esophagus, 17 cm; cbx D. Esophagus, 15 cm, possible Barretts; cbx  CLINICAL HISTORY: None provided  PRE-OPERATIVE DIAGNOSIS: RUQ ABD pain, epigastric pain  POST-OPERATIVE DIAGNOSIS: Hiatal hernia, gastritis, gastric polyp, esophageal polyp, possible Barrett's esophagus     DIAGNOSIS: A.  STOMACH, ANTRUM; COLD BIOPSY: - MILD REACTIVE GASTROPATHY. - NEGATIVE FOR ACTIVE INFLAMMATION, INTESTINAL METAPLASIA, DYSPLASIA, AND MALIGNANCY. - NEGATIVE FOR HELICOBACTER PYLORI IN HEMATOXYLIN AND EOSIN SECTIONS.  B.  GASTROESOPHAGEAL JUNCTION POLYP X 1; COLD BIOPSY: - SQUAMOCOLUMNAR MUCOSA WITH ACTIVE INFLAMMATION CONSISTENT WITH REFLUX ESOPHAGITIS. - REACTIVE FOVEOLAR HYPERPLASIA. - NEGATIVE FOR GOBLET CELLS, DYSPLASIA, AND MALIGNANCY.  C.  ESOPHAGUS, 17 CM; COLD BIOPSY: - SQUAMOUS PAPILLOMA. - NEGA TIVE FOR DYSPLASIA AND MALIGNANCY.  D.  ESOPHAGUS, 15 CM; COLD BIOPSY: - HETEROTOPIC GASTRIC MUCOSA CONSISTENT WITH INLET PATCH. - NEGATIVE FOR GOBLET CELLS, DYSPLASIA AND MALIGNANCY.   GROSS DESCRIPTION: A. Labeled: Gastric antrum cbx Received: In formalin Tissue fragment(s): 3 Size: 0.3-0.5 cm Description: Pink-Tan fragments Entirely submitted in one cassette.  B. Labeled: GEJ polyp cbx x1 Received: In formalin Tissue fragment(s): 3 Size: 0.4-0.5 cm Description: Pink-Tan fragments Entirely submitted in one cassette.  C. Labeled: Esophagus at 17 cm cbx Received: In formalin Tissue fragment(s): 1 Size: 0.4 cm Description: Tan fragment Entirely submitted in one cassette.  D.  Labeled: Esophagus at 15 cm cbx possible Barrett's Received: In formalin Tissue fragment(s): 3 Size: 0.3-0.5 cm Description: Pink-Tan fragments Entirely submitted in one cassette.   Final Diagnosis performed by Bryan Lemma, MD.   Electronically signed 07/27/2017 4:41:58PM The ele ctronic signature indicates that the named Attending Pathologist has evaluated the specimen  Technical component performed at [facility], [address], [place], [place] [address] Lab: [phone] Dir: Rush Farmer, MD, MMM  Professional component performed at [facility], [facility], [place], [place], [place] [address] Lab: [phone] Dir: Dellia Nims. Reuel Derby, MD       Assessment & Plan:   Problem List Items Addressed This Visit      Other   Generalized anxiety disorder - Primary    Stable on current regimen. Rx for 3 months provided today. Follow up in 3 months. Call with any concerns.       Relevant Medications   ALPRAZolam (XANAX) 0.5 MG tablet  ALPRAZolam (XANAX) 0.5 MG tablet (Start on 01/09/2018)   ALPRAZolam (XANAX) 0.5 MG tablet (Start on 02/08/2018)   Hyperlipidemia    Not on medication. Will recheck levels today. Await results. Call with any concerns.       Relevant Orders   Comprehensive metabolic panel   Lipid Panel w/o Chol/HDL Ratio   Vitamin D deficiency    Will recheck levels today. Await results. Call with any concerns.       Relevant Orders   VITAMIN D 25 Hydroxy (Vit-D Deficiency, Fractures)       Follow up plan: Return in about 3 months (around 03/11/2018) for Follow up anxiety.

## 2017-12-20 ENCOUNTER — Telehealth: Payer: Self-pay | Admitting: Family Medicine

## 2017-12-20 NOTE — Telephone Encounter (Signed)
Copied from [place] [id number]. Topic: General - Other >> Dec 20, 2017  8:55 AM Lennox Solders wrote: Reason for CRM: pt would like an order for vit d, lipid panel w/o chol hdl ratio and cmp. [facility] Pt does not go to [facility] >> Dec 20, 2017  9:11 AM Don Perking M wrote: Please advise.

## 2017-12-20 NOTE — Telephone Encounter (Signed)
Orders are already in. We can fax them for her, but I'm pretty sure I already gave her a printed copy of them when she was here.

## 2017-12-20 NOTE — Telephone Encounter (Signed)
Lab orders printed and placed up front for pick up. Patient notified that this was done for her.

## 2018-01-02 ENCOUNTER — Other Ambulatory Visit: Payer: Self-pay

## 2018-01-02 DIAGNOSIS — E782 Mixed hyperlipidemia: Secondary | ICD-10-CM

## 2018-01-02 DIAGNOSIS — E559 Vitamin D deficiency, unspecified: Secondary | ICD-10-CM

## 2018-01-03 LAB — COMPREHENSIVE METABOLIC PANEL
ALT: 20 IU/L (ref 0–32)
AST: 15 IU/L (ref 0–40)
Albumin/Globulin Ratio: 2.2 (ref 1.2–2.2)
Albumin: 4.8 g/dL (ref 3.5–4.8)
Alkaline Phosphatase: 95 IU/L (ref 39–117)
BILIRUBIN TOTAL: 0.4 mg/dL (ref 0.0–1.2)
BUN / CREAT RATIO: 17 (ref 12–28)
BUN: 19 mg/dL (ref 8–27)
CALCIUM: 10.3 mg/dL (ref 8.7–10.3)
CHLORIDE: 102 mmol/L (ref 96–106)
CO2: 21 mmol/L (ref 20–29)
Creatinine, Ser: 1.15 mg/dL — ABNORMAL HIGH (ref 0.57–1.00)
GFR, EST AFRICAN AMERICAN: 55 mL/min/{1.73_m2} — AB (ref >59)
GFR, EST NON AFRICAN AMERICAN: 48 mL/min/{1.73_m2} — AB (ref >59)
GLUCOSE: 95 mg/dL (ref 65–99)
Globulin, Total: 2.2 g/dL (ref 1.5–4.5)
Potassium: 4.5 mmol/L (ref 3.5–5.2)
Sodium: 139 mmol/L (ref 134–144)
TOTAL PROTEIN: 7 g/dL (ref 6.0–8.5)

## 2018-01-03 LAB — LIPID PANEL W/O CHOL/HDL RATIO
Cholesterol, Total: 257 mg/dL — ABNORMAL HIGH (ref 100–199)
HDL: 49 mg/dL (ref >39)
LDL Calculated: 178 mg/dL — ABNORMAL HIGH (ref 0–99)
Triglycerides: 152 mg/dL — ABNORMAL HIGH (ref 0–149)
VLDL CHOLESTEROL CAL: 30 mg/dL (ref 5–40)

## 2018-01-03 LAB — VITAMIN D 25 HYDROXY (VIT D DEFICIENCY, FRACTURES): Vit D, 25-Hydroxy: 22.7 ng/mL — ABNORMAL LOW (ref 30.0–100.0)

## 2018-01-03 NOTE — Progress Notes (Signed)
Patient's Lab Results have been routed to Ordering Provider Park Liter DO 01/03/18 - [place]

## 2018-01-04 NOTE — Addendum Note (Signed)
Addended by: Judie Petit on: 01/04/2018 03:38 PM   Modules accepted: Level of Service

## 2018-01-07 ENCOUNTER — Telehealth: Payer: Self-pay | Admitting: Family Medicine

## 2018-01-07 NOTE — Telephone Encounter (Signed)
Called and left patient a VM asking for her to please return my call about lab results. OK for [facility] to speak to patient if she calls back.

## 2018-01-07 NOTE — Telephone Encounter (Signed)
Patient notified

## 2018-01-07 NOTE — Telephone Encounter (Signed)
Please let her know that her labs look nice and stable, but that her cholesterol got worse. I'd like her to really watch her diet and we'll recheck it next visit. Thanks! Her vitamin D looks better- still a little low but she should be able to bring it up by taking 2000 IU D3 OTC daily. Thanks!

## 2018-03-07 ENCOUNTER — Encounter: Payer: Self-pay | Admitting: Family Medicine

## 2018-03-07 ENCOUNTER — Ambulatory Visit: Payer: Managed Care, Other (non HMO) | Admitting: Family Medicine

## 2018-03-07 VITALS — BP 113/61 | HR 72 | Temp 97.8°F | Ht 63.0 in | Wt 143.1 lb

## 2018-03-07 DIAGNOSIS — E782 Mixed hyperlipidemia: Secondary | ICD-10-CM | POA: Diagnosis not present

## 2018-03-07 DIAGNOSIS — E559 Vitamin D deficiency, unspecified: Secondary | ICD-10-CM

## 2018-03-07 DIAGNOSIS — F411 Generalized anxiety disorder: Secondary | ICD-10-CM

## 2018-03-07 MED ORDER — ALPRAZOLAM 0.5 MG PO TABS: 0.2500 mg | ORAL_TABLET | Freq: Two times a day (BID) | ORAL | 0 refills | Status: AC | PRN

## 2018-03-07 MED ORDER — ALPRAZOLAM 0.5 MG PO TABS: 0.2500 mg | ORAL_TABLET | Freq: Two times a day (BID) | ORAL | 0 refills | Status: DC | PRN

## 2018-03-07 NOTE — Progress Notes (Signed)
Depression screen [facility] 2/9 03/07/2018 12/10/2017 08/07/2017  Decreased Interest 0 0 0  Down, Depressed, Hopeless 0 0 1  PHQ - 2 Score 0 0 1  Altered sleeping 0 1 1  Tired, decreased energy 0 0 0  Change in appetite 1 0 1  Feeling bad or failure about yourself  0 0 0  Trouble concentrating 1 0 0  Moving slowly or fidgety/restless 0 0 0  Suicidal thoughts 0 0 0  PHQ-9 Score 2 1 3   Difficult doing work/chores - Not difficult at all Somewhat difficult   GAD 7 : Generalized Anxiety Score 03/07/2018 12/10/2017 08/07/2017 06/05/2017  Nervous, Anxious, on Edge 1 1 1 1   Control/stop worrying 0 0 1 1  Worry too much - different things 0 0 0 -  Trouble relaxing 1 0 0 0  Restless 0 0 0 1  Easily annoyed or irritable 0 0 0 0  Afraid - awful might happen 0 0 0 1  Total GAD 7 Score 2 1 2  -  Anxiety Difficulty Not difficult at all Not difficult at all Somewhat difficult Not difficult at all

## 2018-03-07 NOTE — Assessment & Plan Note (Signed)
Rechecking levels today. Await results. Treat as needed.  

## 2018-03-07 NOTE — Assessment & Plan Note (Signed)
Does not want any medicine. Will check labs. Await results. Call with any concerns.

## 2018-03-07 NOTE — Progress Notes (Signed)
BP 113/61 (BP Location: Right Arm, Patient Position: Sitting, Cuff Size: Normal)   Pulse 72   Temp 97.8 F (36.6 C) (Oral)   Ht 5\' 3"  (1.6 m)   Wt 143 lb 1.6 oz (64.9 kg)   SpO2 97%   BMI 25.35 kg/m    Subjective:    Patient ID: Maureen [name], female    DOB: 08/15/1945, 72 y.o.   MRN: [id number]  HPI: Maureen [name] is a 72 y.o. female  Chief Complaint  Patient presents with  . Anxiety    3 month F/U  . Vitamin D Defiency  . Hyperlipidemia   ANXIETY/STRESS Duration:stable Anxious mood: yes  Excessive worrying: no Irritability: no  Sweating: no Nausea: no Palpitations:no Hyperventilation: no Panic attacks: no Agoraphobia: no  Obscessions/compulsions: no Depressed mood: no Depression screen [facility] 2/9 03/07/2018 12/10/2017 08/07/2017 06/05/2017 04/19/2017  Decreased Interest 0 0 0 1 0  Down, Depressed, Hopeless 0 0 1 1 0  PHQ - 2 Score 0 0 1 2 0  Altered sleeping 0 1 1 1  0  Tired, decreased energy 0 0 0 1 0  Change in appetite 1 0 1 1 0  Feeling bad or failure about yourself  0 0 0 0 0  Trouble concentrating 1 0 0 0 0  Moving slowly or fidgety/restless 0 0 0 0 0  Suicidal thoughts 0 0 0 0 0  PHQ-9 Score 2 1 3 5  0  Difficult doing work/chores - Not difficult at all Somewhat difficult - -   GAD 7 : Generalized Anxiety Score 03/07/2018 12/10/2017 08/07/2017 06/05/2017  Nervous, Anxious, on Edge 1 1 1 1   Control/stop worrying 0 0 1 1  Worry too much - different things 0 0 0 -  Trouble relaxing 1 0 0 0  Restless 0 0 0 1  Easily annoyed or irritable 0 0 0 0  Afraid - awful might happen 0 0 0 1  Total GAD 7 Score 2 1 2  -  Anxiety Difficulty Not difficult at all Not difficult at all Somewhat difficult Not difficult at all   Anhedonia: no Weight changes: no Insomnia: no   Hypersomnia: no Fatigue/loss of energy: no Feelings of worthlessness: no Feelings of guilt: no Impaired concentration/indecisiveness: no Suicidal ideations: no  Crying spells: no Recent  Stressors/Life Changes: no   Relationship problems: no   Family stress: no     Financial stress: no    Job stress: no    Recent death/loss: no  HYPERLIPIDEMIA Hyperlipidemia status: stable- has not been watching her diet. Does not want medicine Satisfied with current treatment?  yes Side effects:  no Medication compliance: not on anytihng Past cholesterol meds: has been on other things, but doesn't remember what it was Supplements: fish oil Aspirin:  no The 10-year ASCVD risk score Mikey Bussing DC Jr., et al., 2013) is: 14.2%   Values used to calculate the score:     Age: 46 years     Sex: Female     Is Non-Hispanic African American: No     Diabetic: No     Tobacco smoker: Yes     Systolic Blood Pressure: 832 mmHg     Is BP treated: No     HDL Cholesterol: 49 mg/dL     Total Cholesterol: 257 mg/dL Chest pain:  no   Relevant past medical, surgical, family and social history reviewed and updated as indicated. Interim medical history since our last visit reviewed. Allergies and medications reviewed and updated.  Review of Systems  Constitutional: Negative.   Respiratory: Negative.   Cardiovascular: Negative.   Musculoskeletal: Negative.   Psychiatric/Behavioral: Negative.     Per HPI unless specifically indicated above     Objective:    BP 113/61 (BP Location: Right Arm, Patient Position: Sitting, Cuff Size: Normal)   Pulse 72   Temp 97.8 F (36.6 C) (Oral)   Ht 5\' 3"  (1.6 m)   Wt 143 lb 1.6 oz (64.9 kg)   SpO2 97%   BMI 25.35 kg/m   Wt Readings from Last 3 Encounters:  03/07/18 143 lb 1.6 oz (64.9 kg)  12/10/17 140 lb (63.5 kg)  08/07/17 142 lb 7 oz (64.6 kg)    Physical Exam Vitals signs and nursing note reviewed.  Constitutional:      General: She is not in acute distress.    Appearance: Normal appearance. She is not ill-appearing, toxic-appearing or diaphoretic.  HENT:     Head: Normocephalic and atraumatic.     Right Ear: External ear normal.     Left  Ear: External ear normal.     Nose: Nose normal.     Mouth/Throat:     Mouth: Mucous membranes are moist.     Pharynx: Oropharynx is clear.  Eyes:     General: No scleral icterus.       Right eye: No discharge.        Left eye: No discharge.     Extraocular Movements: Extraocular movements intact.     Conjunctiva/sclera: Conjunctivae normal.     Pupils: Pupils are equal, round, and reactive to light.  Neck:     Musculoskeletal: Normal range of motion and neck supple.  Cardiovascular:     Rate and Rhythm: Normal rate and regular rhythm.     Pulses: Normal pulses.     Heart sounds: Normal heart sounds. No murmur. No friction rub. No gallop.   Pulmonary:     Effort: Pulmonary effort is normal. No respiratory distress.     Breath sounds: Normal breath sounds. No stridor. No wheezing, rhonchi or rales.  Chest:     Chest wall: No tenderness.  Musculoskeletal: Normal range of motion.  Skin:    General: Skin is warm and dry.     Capillary Refill: Capillary refill takes less than 2 seconds.     Coloration: Skin is not jaundiced or pale.     Findings: No bruising, erythema, lesion or rash.  Neurological:     General: No focal deficit present.     Mental Status: She is alert and oriented to person, place, and time. Mental status is at baseline.  Psychiatric:        Mood and Affect: Mood normal.        Behavior: Behavior normal.        Thought Content: Thought content normal.        Judgment: Judgment normal.     Results for orders placed or performed in visit on 01/02/18  Comprehensive metabolic panel  Result Value Ref Range   Glucose 95 65 - 99 mg/dL   BUN 19 8 - 27 mg/dL   Creatinine, Ser 1.15 (H) 0.57 - 1.00 mg/dL   GFR calc non Af Amer 48 (L) >59 mL/min/1.73   GFR calc Af Amer 55 (L) >59 mL/min/1.73   BUN/Creatinine Ratio 17 12 - 28   Sodium 139 134 - 144 mmol/L   Potassium 4.5 3.5 - 5.2 mmol/L   Chloride 102 96 - 106 mmol/L  CO2 21 20 - 29 mmol/L   Calcium 10.3 8.7 -  10.3 mg/dL   Total Protein 7.0 6.0 - 8.5 g/dL   Albumin 4.8 3.5 - 4.8 g/dL   Globulin, Total 2.2 1.5 - 4.5 g/dL   Albumin/Globulin Ratio 2.2 1.2 - 2.2   Bilirubin Total 0.4 0.0 - 1.2 mg/dL   Alkaline Phosphatase 95 39 - 117 IU/L   AST 15 0 - 40 IU/L   ALT 20 0 - 32 IU/L  Lipid Panel w/o Chol/HDL Ratio  Result Value Ref Range   Cholesterol, Total 257 (H) 100 - 199 mg/dL   Triglycerides 152 (H) 0 - 149 mg/dL   HDL 49 >39 mg/dL   VLDL Cholesterol Cal 30 5 - 40 mg/dL   LDL Calculated 178 (H) 0 - 99 mg/dL  VITAMIN D 25 Hydroxy (Vit-D Deficiency, Fractures)  Result Value Ref Range   Vit D, 25-Hydroxy 22.7 (L) 30.0 - 100.0 ng/mL      Assessment & Plan:   Problem List Items Addressed This Visit      Other   Generalized anxiety disorder - Primary    Under good control on current regimen. Continue current regimen. Continue to monitor. Call with any concerns. Refills given for 3 months. Follow up 3 months        Relevant Medications   ALPRAZolam (XANAX) 0.5 MG tablet (Start on 03/10/2018)   ALPRAZolam (XANAX) 0.5 MG tablet (Start on 04/09/2018)   ALPRAZolam (XANAX) 0.5 MG tablet (Start on 05/09/2018)   Hyperlipidemia    Does not want any medicine. Will check labs. Await results. Call with any concerns.       Relevant Orders   Lipid Panel w/o Chol/HDL Ratio   Comprehensive metabolic panel   Vitamin D deficiency    Rechecking levels today. Await results. Treat as needed.       Relevant Orders   VITAMIN D 25 Hydroxy (Vit-D Deficiency, Fractures)       Follow up plan: Return in about 3 months (around 06/06/2018) for Physical.

## 2018-03-07 NOTE — Assessment & Plan Note (Signed)
Under good control on current regimen. Continue current regimen. Continue to monitor. Call with any concerns. Refills given for 3 months. Follow up 3 months.    

## 2018-04-08 ENCOUNTER — Encounter: Payer: Self-pay | Admitting: Family Medicine

## 2018-04-16 ENCOUNTER — Other Ambulatory Visit: Payer: Self-pay

## 2018-04-16 DIAGNOSIS — E559 Vitamin D deficiency, unspecified: Secondary | ICD-10-CM

## 2018-04-16 DIAGNOSIS — E782 Mixed hyperlipidemia: Secondary | ICD-10-CM

## 2018-04-17 ENCOUNTER — Encounter: Payer: Self-pay | Admitting: Family Medicine

## 2018-04-17 LAB — COMPREHENSIVE METABOLIC PANEL
A/G RATIO: 2 (ref 1.2–2.2)
ALT: 22 IU/L (ref 0–32)
AST: 15 IU/L (ref 0–40)
Albumin: 4.5 g/dL (ref 3.7–4.7)
Alkaline Phosphatase: 97 IU/L (ref 39–117)
BILIRUBIN TOTAL: 0.4 mg/dL (ref 0.0–1.2)
BUN/Creatinine Ratio: 13 (ref 12–28)
BUN: 13 mg/dL (ref 8–27)
CHLORIDE: 104 mmol/L (ref 96–106)
CO2: 20 mmol/L (ref 20–29)
Calcium: 10.2 mg/dL (ref 8.7–10.3)
Creatinine, Ser: 1 mg/dL (ref 0.57–1.00)
GFR calc non Af Amer: 57 mL/min/{1.73_m2} — ABNORMAL LOW (ref >59)
GFR, EST AFRICAN AMERICAN: 65 mL/min/{1.73_m2} (ref >59)
GLOBULIN, TOTAL: 2.3 g/dL (ref 1.5–4.5)
Glucose: 97 mg/dL (ref 65–99)
POTASSIUM: 4.3 mmol/L (ref 3.5–5.2)
SODIUM: 141 mmol/L (ref 134–144)
Total Protein: 6.8 g/dL (ref 6.0–8.5)

## 2018-04-17 LAB — LIPID PANEL WITH LDL/HDL RATIO
CHOLESTEROL TOTAL: 257 mg/dL — AB (ref 100–199)
HDL: 47 mg/dL (ref >39)
LDL CALC: 180 mg/dL — AB (ref 0–99)
LDL/HDL RATIO: 3.8 ratio — AB (ref 0.0–3.2)
TRIGLYCERIDES: 151 mg/dL — AB (ref 0–149)
VLDL Cholesterol Cal: 30 mg/dL (ref 5–40)

## 2018-04-17 LAB — VITAMIN D 25 HYDROXY (VIT D DEFICIENCY, FRACTURES): Vit D, 25-Hydroxy: 12.8 ng/mL — ABNORMAL LOW (ref 30.0–100.0)

## 2018-04-17 MED ORDER — VITAMIN D (ERGOCALCIFEROL) 1.25 MG (50000 UNIT) PO CAPS: 50000.0000 [IU] | ORAL_CAPSULE | ORAL | 1 refills | Status: DC

## 2018-04-17 NOTE — Progress Notes (Signed)
Patient notified of results by letter

## 2018-04-17 NOTE — Progress Notes (Signed)
Lab Results from 04/16/2018 have been routed to the Ordering Provider [place]

## 2018-05-17 IMAGING — NM NM HEPATO W/GB/PHARM/[PERSON_NAME]
2 series · 12 of 12 positions shown · non-contrast
Comparison: None.

CLINICAL DATA: Chronic abdominal pain

EXAM:
NUCLEAR MEDICINE HEPATOBILIARY IMAGING WITH GALLBLADDER EF
VIEWS:
Anterior right upper quadrant
RADIOPHARMACEUTICALS:  5.08 mCi Fc-CCm  Choletec IV

[Series 1000: gallbladder ef · 4.80mm/px · 6 of 120 frames shown]
[frame 11/120]
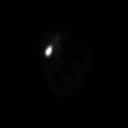
[frame 31/120]
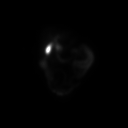
[frame 51/120]
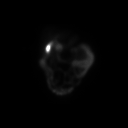
[frame 71/120]
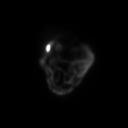
[frame 91/120]
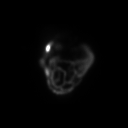
[frame 111/120]
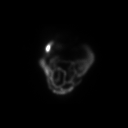

[Series 1000: hepatobiliary scan · 9.59mm/px · 6 of 60 frames shown]
[frame 6/60]
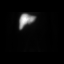
[frame 16/60]
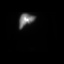
[frame 26/60]
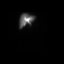
[frame 36/60]
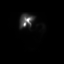
[frame 46/60]
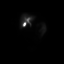
[frame 56/60]
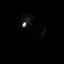

[12 of 12 positions shown; findings below may reference images not displayed]

FINDINGS: Liver uptake of radiotracer is normal. There is prompt visualization
of gallbladder and small bowel, indicating patency of the cystic and
common bile ducts. The patient consumed 8 ounces of Ensure orally
with calculation of the computer generated ejection fraction of
radiotracer from the gallbladder. The patient experienced right
upper quadrant abdominal pain with the Ensure consumption. The
computer generated ejection fraction of radiotracer from the
gallbladder is normal at 74%, normal greater than 33% using the oral
agent.
IMPRESSION: Normal ejection fraction of radiotracer from the gallbladder. The
patient did experience clinical symptoms with the oral Ensure
consumption. Cystic and common bile ducts are patent as is evidenced
by visualization of gallbladder and small bowel.

## 2018-06-07 ENCOUNTER — Encounter: Payer: Managed Care, Other (non HMO) | Admitting: Family Medicine

## 2018-06-17 NOTE — Progress Notes (Signed)
BP 140/82 Comment: pt reported- virtual visit   Subjective:    Patient ID: Maureen [name], female    DOB: 1945/09/21, 73 y.o.   MRN: [id number]  HPI: Maureen [name] is a 73 y.o. female  Chief Complaint  Patient presents with  . Anxiety  . Depression  . TELEMEDICINE VISIT   ANXIETY/STRESS- has been not doing great. Has had 2 deaths in the family. Feels like her medicine is still helping her. Feels like her xanax is still helpful Duration:stable Anxious mood: yes  Excessive worrying: yes Irritability: no  Sweating: no Nausea: no Palpitations:no Hyperventilation: no Panic attacks: no Agoraphobia: no  Obscessions/compulsions: no Depressed mood: yes Depression screen [facility] 2/9 06/19/2018 03/07/2018 12/10/2017 08/07/2017 06/05/2017  Decreased Interest 1 0 0 0 1  Down, Depressed, Hopeless 1 0 0 1 1  PHQ - 2 Score 2 0 0 1 2  Altered sleeping 0 0 1 1 1   Tired, decreased energy 0 0 0 0 1  Change in appetite 0 1 0 1 1  Feeling bad or failure about yourself  0 0 0 0 0  Trouble concentrating 0 1 0 0 0  Moving slowly or fidgety/restless 0 0 0 0 0  Suicidal thoughts 0 0 0 0 0  PHQ-9 Score 2 2 1 3 5   Difficult doing work/chores Not difficult at all - Not difficult at all Somewhat difficult -   GAD 7 : Generalized Anxiety Score 06/19/2018 03/07/2018 12/10/2017 08/07/2017  Nervous, Anxious, on Edge 1 1 1 1   Control/stop worrying 0 0 0 1  Worry too much - different things 0 0 0 0  Trouble relaxing 1 1 0 0  Restless 0 0 0 0  Easily annoyed or irritable 0 0 0 0  Afraid - awful might happen 0 0 0 0  Total GAD 7 Score 2 2 1 2   Anxiety Difficulty Not difficult at all Not difficult at all Not difficult at all Somewhat difficult   Anhedonia: no Weight changes: no Insomnia: no   Hypersomnia: no Fatigue/loss of energy: no Feelings of worthlessness: no Feelings of guilt: no Impaired concentration/indecisiveness: no Suicidal ideations: no  Crying spells: no Recent Stressors/Life Changes:  yes   Relationship problems: no   Family stress: yes     Financial stress: no    Job stress: no    Recent death/loss: yes\  COPD COPD status: worse Satisfied with current treatment?: no Oxygen use: no Dyspnea frequency: never Cough frequency: occasionally Rescue inhaler frequency:  occasionally Limitation of activity: yes Productive cough: no   Relevant past medical, surgical, family and social history reviewed and updated as indicated. Interim medical history since our last visit reviewed. Allergies and medications reviewed and updated.  Review of Systems  Constitutional: Negative.   HENT: Negative.   Respiratory: Positive for cough. Negative for apnea, choking, chest tightness, shortness of breath, wheezing and stridor.   Cardiovascular: Negative.   Gastrointestinal: Negative.   Skin: Negative.   Neurological: Negative.   Psychiatric/Behavioral: Positive for decreased concentration. Negative for agitation, behavioral problems, confusion, dysphoric mood, hallucinations, self-injury, sleep disturbance and suicidal ideas. The patient is nervous/anxious. The patient is not hyperactive.     Per HPI unless specifically indicated above     Objective:    BP 140/82 Comment: pt reported- virtual visit  Wt Readings from Last 3 Encounters:  03/07/18 143 lb 1.6 oz (64.9 kg)  12/10/17 140 lb (63.5 kg)  08/07/17 142 lb 7 oz (64.6 kg)  Physical Exam Vitals signs and nursing note reviewed.  Constitutional:      General: She is not in acute distress.    Appearance: Normal appearance. She is not ill-appearing, toxic-appearing or diaphoretic.  HENT:     Head: Normocephalic and atraumatic.     Right Ear: External ear normal.     Left Ear: External ear normal.     Nose: Nose normal.     Mouth/Throat:     Mouth: Mucous membranes are moist.     Pharynx: Oropharynx is clear.  Eyes:     General: No scleral icterus.       Right eye: No discharge.        Left eye: No discharge.      Conjunctiva/sclera: Conjunctivae normal.     Pupils: Pupils are equal, round, and reactive to light.  Neck:     Musculoskeletal: Normal range of motion.  Pulmonary:     Effort: Pulmonary effort is normal. No respiratory distress.     Comments: Speaking in full sentences Musculoskeletal: Normal range of motion.  Skin:    Coloration: Skin is not jaundiced or pale.     Findings: No bruising, erythema, lesion or rash.  Neurological:     Mental Status: She is alert and oriented to person, place, and time. Mental status is at baseline.  Psychiatric:        Mood and Affect: Mood normal.        Behavior: Behavior normal.        Thought Content: Thought content normal.        Judgment: Judgment normal.     Results for orders placed or performed in visit on 04/16/18  VITAMIN D 25 Hydroxy (Vit-D Deficiency, Fractures)  Result Value Ref Range   Vit D, 25-Hydroxy 12.8 (L) 30.0 - 100.0 ng/mL  Lipid Panel With LDL/HDL Ratio  Result Value Ref Range   Cholesterol, Total 257 (H) 100 - 199 mg/dL   Triglycerides 151 (H) 0 - 149 mg/dL   HDL 47 >39 mg/dL   VLDL Cholesterol Cal 30 5 - 40 mg/dL   LDL Calculated 180 (H) 0 - 99 mg/dL   LDl/HDL Ratio 3.8 (H) 0.0 - 3.2 ratio  Comprehensive metabolic panel  Result Value Ref Range   Glucose 97 65 - 99 mg/dL   BUN 13 8 - 27 mg/dL   Creatinine, Ser 1.00 0.57 - 1.00 mg/dL   GFR calc non Af Amer 57 (L) >59 mL/min/1.73   GFR calc Af Amer 65 >59 mL/min/1.73   BUN/Creatinine Ratio 13 12 - 28   Sodium 141 134 - 144 mmol/L   Potassium 4.3 3.5 - 5.2 mmol/L   Chloride 104 96 - 106 mmol/L   CO2 20 20 - 29 mmol/L   Calcium 10.2 8.7 - 10.3 mg/dL   Total Protein 6.8 6.0 - 8.5 g/dL   Albumin 4.5 3.7 - 4.7 g/dL   Globulin, Total 2.3 1.5 - 4.5 g/dL   Albumin/Globulin Ratio 2.0 1.2 - 2.2   Bilirubin Total 0.4 0.0 - 1.2 mg/dL   Alkaline Phosphatase 97 39 - 117 IU/L   AST 15 0 - 40 IU/L   ALT 22 0 - 32 IU/L      Assessment & Plan:   Problem List Items  Addressed This Visit      Respiratory   COPD (chronic obstructive pulmonary disease) with chronic bronchitis (HCC)    Symbicort not quite cutting it. Will add spiriva and recheck in 3 months. Call  with any concerns or if getting worse.       Relevant Medications   tiotropium (SPIRIVA HANDIHALER) 18 MCG inhalation capsule   budesonide-formoterol (SYMBICORT) 160-4.5 MCG/ACT inhaler   albuterol (PROVENTIL HFA;VENTOLIN HFA) 108 (90 Base) MCG/ACT inhaler     Other   Generalized anxiety disorder - Primary    Slightly exacerbated with the COVID issues and recent deaths. We will continue her xanax. Refills for 3 months given today. Call with any concerns. Recheck 3 months.       Relevant Medications   ALPRAZolam (XANAX) 0.5 MG tablet   ALPRAZolam (XANAX) 0.5 MG tablet (Start on 07/19/2018)   ALPRAZolam (XANAX) 0.5 MG tablet (Start on 08/18/2018)       Follow up plan: Return in about 3 months (around 09/18/2018) for Physical and Anxiety follow up.    . This visit was completed via FaceTime due to the restrictions of the COVID-19 pandemic. All issues as above were discussed and addressed. Physical exam was done as above through visual confirmation on FaceTIme. If it was felt that the patient should be evaluated in the office, they were directed there. The patient verbally consented to this visit. . Location of the patient: home . Location of the provider: home . Those involved with this call:  . Provider: Park Liter, DO . CMA: Yvonna Alanis, [place] . Front Desk/Registration: Don Perking  . Time spent on call: 25 minutes with patient face to face via video conference. More than 50% of this time was spent in counseling and coordination of care.

## 2018-06-19 ENCOUNTER — Other Ambulatory Visit: Payer: Self-pay

## 2018-06-19 ENCOUNTER — Encounter: Payer: Self-pay | Admitting: Family Medicine

## 2018-06-19 ENCOUNTER — Ambulatory Visit (INDEPENDENT_AMBULATORY_CARE_PROVIDER_SITE_OTHER): Payer: Managed Care, Other (non HMO) | Admitting: Family Medicine

## 2018-06-19 VITALS — BP 140/82

## 2018-06-19 DIAGNOSIS — J449 Chronic obstructive pulmonary disease, unspecified: Secondary | ICD-10-CM

## 2018-06-19 DIAGNOSIS — F411 Generalized anxiety disorder: Secondary | ICD-10-CM | POA: Diagnosis not present

## 2018-06-19 MED ORDER — ALBUTEROL SULFATE HFA 108 (90 BASE) MCG/ACT IN AERS: INHALATION_SPRAY | RESPIRATORY_TRACT | 3 refills | Status: DC

## 2018-06-19 MED ORDER — VITAMIN D (ERGOCALCIFEROL) 1.25 MG (50000 UNIT) PO CAPS: 50000.0000 [IU] | ORAL_CAPSULE | ORAL | 1 refills | Status: DC

## 2018-06-19 MED ORDER — ALPRAZOLAM 0.5 MG PO TABS: 0.2500 mg | ORAL_TABLET | Freq: Two times a day (BID) | ORAL | 0 refills | Status: DC | PRN

## 2018-06-19 MED ORDER — TIOTROPIUM BROMIDE MONOHYDRATE 18 MCG IN CAPS: 18.0000 ug | ORAL_CAPSULE | Freq: Every day | RESPIRATORY_TRACT | 12 refills | Status: DC

## 2018-06-19 MED ORDER — ALPRAZOLAM 0.5 MG PO TABS: 0.2500 mg | ORAL_TABLET | Freq: Two times a day (BID) | ORAL | 0 refills | Status: AC | PRN

## 2018-06-19 MED ORDER — BUDESONIDE-FORMOTEROL FUMARATE 160-4.5 MCG/ACT IN AERO: 2.0000 | INHALATION_SPRAY | Freq: Two times a day (BID) | RESPIRATORY_TRACT | 6 refills | Status: DC

## 2018-06-19 NOTE — Assessment & Plan Note (Signed)
Symbicort not quite cutting it. Will add spiriva and recheck in 3 months. Call with any concerns or if getting worse.

## 2018-06-19 NOTE — Assessment & Plan Note (Signed)
Slightly exacerbated with the COVID issues and recent deaths. We will continue her xanax. Refills for 3 months given today. Call with any concerns. Recheck 3 months.

## 2018-06-20 ENCOUNTER — Ambulatory Visit: Payer: Managed Care, Other (non HMO) | Admitting: General Surgery

## 2018-07-25 ENCOUNTER — Ambulatory Visit: Payer: Managed Care, Other (non HMO) | Admitting: General Surgery

## 2018-08-06 ENCOUNTER — Ambulatory Visit (INDEPENDENT_AMBULATORY_CARE_PROVIDER_SITE_OTHER): Payer: Managed Care, Other (non HMO) | Admitting: General Surgery

## 2018-08-06 ENCOUNTER — Other Ambulatory Visit: Payer: Self-pay

## 2018-08-06 ENCOUNTER — Encounter: Payer: Self-pay | Admitting: General Surgery

## 2018-08-06 VITALS — BP 156/90 | HR 96 | Temp 97.7°F | Resp 14 | Ht 63.0 in | Wt 156.0 lb

## 2018-08-06 DIAGNOSIS — Z853 Personal history of malignant neoplasm of breast: Secondary | ICD-10-CM

## 2018-08-06 NOTE — Progress Notes (Signed)
Patient ID: Maureen [name], female   DOB: [birth year], 73 y.o.   MRN: [id number]  Chief Complaint  Patient presents with  . Follow-up    HPI Maureen [name] is a 73 y.o. female with a history of breast cancer who presents for an evaluation.   HPI  Past Medical History:  Diagnosis Date  . Arthritis 2005  . 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 This was a histologic grade 2 tumor measuring just under 1 cm in diameter. No invasive cancer was identified. ER 90%, PR 40%.  . Breast cancer, right breast ([place]) 1994   Invasive ductal carcinoma.The patient underwent right mastectomy in 1994 for invasive ductal carcinoma.  . Discoid lupus 2005  . GERD (gastroesophageal reflux disease)   . Heart murmur 2005  . Hypercholesterolemia 2012  . Mammographic microcalcification 2011  . Panic attack   . Personal history of colonic polyps    Colon polyps  . Personal history of tobacco use, presenting hazards to health   . PUD (peptic ulcer disease)    Last EGD 1/09  . Rectal polyp 2008   villous adenoma; last colonoscopy 1/09 WNL  . Skin cancer, basal cell 2015   right cheek Mohs surgery at [facility]  . Ulcer     Past Surgical History:  Procedure Laterality Date  . APPENDECTOMY  2011  . BREAST BIOPSY  12/27/2009   right breast  . CARDIAC CATHETERIZATION  1996   Negative Cath  . COLONOSCOPY N/A 08/05/2014   Procedure: COLONOSCOPY;  Surgeon: [name], MD;  Location: [facility] ENDOSCOPY;  EGD, tubular adenoma 1.  . COLONOSCOPY W/ BIOPSIES  2005,2009   Dr. Bary Castilla. Normal exam 2009. Villous adenoma of the rectum in 2005 adenomatous polyp from the ascending colon and rectum in 2002.  . ESOPHAGOGASTRODUODENOSCOPY N/A 08/05/2014   Procedure: ESOPHAGOGASTRODUODENOSCOPY (EGD);  Surgeon: [name], MD;  Location: [facility] ENDOSCOPY;  Service: Endoscopy;  Laterality: N/A;  . ESOPHAGOGASTRODUODENOSCOPY (EGD) WITH PROPOFOL  N/A 07/25/2017   Procedure: ESOPHAGOGASTRODUODENOSCOPY (EGD) WITH PROPOFOL;  Surgeon: [name], MD;  Location: [facility] ENDOSCOPY;  Service: Endoscopy;  Laterality: N/A;  . MASTECTOMY     bilateral  . SIMPLE MASTECTOMY  1993   right  . SIMPLE MASTECTOMY  2011   left  . TONSILLECTOMY  1997  . UPPER GI ENDOSCOPY  2003, 2005, 2009, 2012, 2014   2012 showed mild reflux changes without evidence of Barrett's epithelial changes 2014 biopsies at 37 cm showed changes suggestive of reflux esophagitis. No metaplasia.  Marland Kitchen VAGINAL HYSTERECTOMY  1977    Family History  Problem Relation Age of Onset  . Cancer Father        esophagus  . Cancer Maternal Aunt        breast cancer  . Cancer Maternal Grandmother        breast cancer    Social History Social History   Tobacco Use  . Smoking status: Current Every Day Smoker    Packs/day: 1.00    Years: 40.00    Pack years: 40.00    Types: Cigarettes  . Smokeless tobacco: Never Used  . Tobacco comment: Pt wants to do hypnotizing  Substance Use Topics  . Alcohol use: Yes    Alcohol/week: 1.0 - 2.0 standard drinks    Types: 1 - 2 Standard drinks or equivalent per week  . Drug use: No    Allergies  Allergen Reactions  .  Codeine Itching  . Sulfa Antibiotics Hives  . Sulfinpyrazone   . Erythromycin Rash  . Penicillins Rash    Current Outpatient Medications  Medication Sig Dispense Refill  . acetaminophen (TYLENOL) 500 MG tablet Take 500 mg by mouth every 6 (six) hours as needed for headache.    . albuterol (PROVENTIL HFA;VENTOLIN HFA) 108 (90 Base) MCG/ACT inhaler TAKE 2 PUFFS BY MOUTH EVERY 6 HOURS AS NEEDED FOR WHEEZE OR SHORTNESS OF BREATH 18 Inhaler 3  . [START ON 08/18/2018] ALPRAZolam (XANAX) 0.5 MG tablet Take 0.5-1 tablets (0.25-0.5 mg total) by mouth 2 (two) times daily as needed for up to 30 days for anxiety. 60 tablet 0  . Ascorbic Acid (VITAMIN C) 100 MG tablet Take 100 mg by mouth daily.    . budesonide-formoterol  (SYMBICORT) 160-4.5 MCG/ACT inhaler Inhale 2 puffs into the lungs 2 (two) times daily. 1 Inhaler 6  . Cholecalciferol (VITAMIN D3) 50 MCG (2000 UT) TABS Take by mouth.    . clobetasol cream (TEMOVATE) 4.27 % Apply 1 application topically daily.    . Homeopathic Products (ZICAM ALLERGY RELIEF NA) Place 2 sprays into the nose daily as needed (allergies).     Marland Kitchen omeprazole (PRILOSEC) 20 MG capsule Take 20 mg by mouth daily.    Marland Kitchen RA KRILL OIL 500 MG CAPS Take 500 mg by mouth daily.    Marland Kitchen tiotropium (SPIRIVA HANDIHALER) 18 MCG inhalation capsule Place 1 capsule (18 mcg total) into inhaler and inhale daily. 30 capsule 12  . Vitamin D, Ergocalciferol, (DRISDOL) 1.25 MG (50000 UT) CAPS capsule Take 1 capsule (50,000 Units total) by mouth every 7 (seven) days. 12 capsule 1  . zinc gluconate 50 MG tablet Take 50 mg by mouth every other day.     No current facility-administered medications for this visit.     Review of Systems Review of Systems  Constitutional: Negative.   Respiratory: Negative.   Cardiovascular: Negative.     Blood pressure (!) 156/90, pulse 96, temperature 97.7 F (36.5 C), resp. rate 14, height 5' 3"  (1.6 m), weight 156 lb (70.8 kg), SpO2 95 %.  Physical Exam Physical Exam Exam conducted with a chaperone present.  Constitutional:      Appearance: She is well-developed.  HENT:     Mouth/Throat:     Pharynx: No oropharyngeal exudate.  Eyes:     General: No scleral icterus.    Conjunctiva/sclera: Conjunctivae normal.  Neck:     Musculoskeletal: Neck supple.  Cardiovascular:     Rate and Rhythm: Normal rate and regular rhythm.     Heart sounds: Normal heart sounds.  Pulmonary:     Effort: Pulmonary effort is normal.     Breath sounds: Normal breath sounds.  Chest:       Comments: bilateral mastectomy  Lymphadenopathy:     Cervical: No cervical adenopathy.     Upper Body:     Right upper body: No supraclavicular or axillary adenopathy.     Left upper body: No  supraclavicular or axillary adenopathy.  Skin:    General: Skin is warm and dry.  Neurological:     Mental Status: She is alert and oriented to person, place, and time.  Psychiatric:        Behavior: Behavior normal.      Data Reviewed No current imaging. Laboratory studies of April 16, 2018 reviewed.  Vitamin D level low at 12.8.  Comprehensive metabolic panel notable for minimal depression of the EGFR 57.  Normal electrolytes.  Normal liver function studies.  Elevated serum cholesterol and triglycerides.  Assessment No evidence of recurrent breast cancer.  Plan We will contact the patient and see if she is amenable to either a screening chest CT due to her smoking history or bone density testing. The patient is aware to call back for any questions or new concerns. Follow up in one year.    HPI, Physical Exam, Assessment and Plan have been scribed under the direction and in the presence of [name], MD  Concepcion Living, LPN HPI, assessment, plan and physical exam has been scribed under the direction and in the presence of [name], MD. Karie Fetch, RN  I have completed the exam and reviewed the above documentation for accuracy and completeness.  I agree with the above.  Haematologist has been used and any errors in dictation or transcription are unintentional.  Hervey Ard, M.D., F.A.C.S.  Forest Gleason Shann Lewellyn 08/07/2018, 3:00 PM

## 2018-08-06 NOTE — Patient Instructions (Signed)
The patient is aware to call back for any questions or new concerns. Follow up in one year.

## 2018-08-07 ENCOUNTER — Telehealth: Payer: Self-pay | Admitting: Family Medicine

## 2018-08-07 NOTE — Telephone Encounter (Signed)
Tried to reach patient. LVM to call back. Patient needs to schedule an appt with Dr Wynetta Emery

## 2018-08-13 ENCOUNTER — Telehealth: Payer: Self-pay | Admitting: *Deleted

## 2018-08-13 DIAGNOSIS — Z122 Encounter for screening for malignant neoplasm of respiratory organs: Secondary | ICD-10-CM

## 2018-08-13 DIAGNOSIS — Z853 Personal history of malignant neoplasm of breast: Secondary | ICD-10-CM

## 2018-08-13 NOTE — Telephone Encounter (Signed)
-----   Message from [name], MD sent at 08/07/2018  3:04 PM EDT ----- Please contact patient and see if she would be amenable to having a screening chest CT for lung cancer detection as well as a bone density test.  Thank you

## 2018-08-13 NOTE — Telephone Encounter (Signed)
Tried to reach patient at her work number and at home. No answer at work or home and not able to leave a message.

## 2018-08-14 NOTE — Telephone Encounter (Signed)
Patient was contacted at work today and agreeable to CT scan and bone density test.   The patient will be leaving to go have her hair cut so I told her that we would have Caryl-Lyn follow up with her tomorrow with the details.

## 2018-08-28 ENCOUNTER — Ambulatory Visit: Payer: Managed Care, Other (non HMO)

## 2018-08-29 ENCOUNTER — Ambulatory Visit: Payer: Managed Care, Other (non HMO)

## 2018-09-02 ENCOUNTER — Other Ambulatory Visit: Payer: Self-pay | Admitting: General Surgery

## 2018-09-02 DIAGNOSIS — Z853 Personal history of malignant neoplasm of breast: Secondary | ICD-10-CM

## 2018-09-02 DIAGNOSIS — Z122 Encounter for screening for malignant neoplasm of respiratory organs: Secondary | ICD-10-CM

## 2018-09-03 ENCOUNTER — Telehealth: Payer: Self-pay | Admitting: General Surgery

## 2018-09-03 NOTE — Telephone Encounter (Signed)
Message left for [facility] that this is supposed to be a lung cancer screening and the order may be changed.

## 2018-09-03 NOTE — Telephone Encounter (Signed)
Marleda with Radiology is calling and needs to speak with one of the nurses please call her back regarding this patient chest ct with contrast diag. Lung cancer screening and they have it approved and they wanted to see if they can change it to the lung cancer screening she can be reached at [phone]. Please call and advise.

## 2018-09-04 ENCOUNTER — Telehealth: Payer: Self-pay | Admitting: *Deleted

## 2018-09-04 ENCOUNTER — Telehealth: Payer: Self-pay | Admitting: Family Medicine

## 2018-09-04 DIAGNOSIS — Z122 Encounter for screening for malignant neoplasm of respiratory organs: Secondary | ICD-10-CM

## 2018-09-04 NOTE — Telephone Encounter (Signed)
Copied from [place] [id number]. Topic: General - Inquiry >> Sep 04, 2018  9:23 AM Mathis Bud wrote: Reason for CRM: Patient called stating she needs labs done but she does not want to come in she states she gets them done for free, and will send labs over to the office. She did not know what the free clinic was called. Patient also has some questions regarding her medications, patient states its been so long since she has been seen in person she just has a lot of follow up questions.  Call back # [phone]

## 2018-09-04 NOTE — Telephone Encounter (Signed)
Received referral for initial lung cancer screening scan. Contacted patient and obtained smoking history,(current, 40 pack year) as well as answering questions related to screening process. Patient denies signs of lung cancer such as weight loss or hemoptysis. Patient denies comorbidity that would prevent curative treatment if lung cancer were found. Patient is scheduled for shared decision making visit and CT scan on 09/12/18 at 145pm.

## 2018-09-04 NOTE — Telephone Encounter (Signed)
She is due for her physical at the beginning of July. She can come in and we can discuss everything. If she doesn't want to come in, she can have a virtual visit and we can discuss whatever she needs.   Her labs were done in January, but she is due to have all her labs at her physical in July.

## 2018-09-04 NOTE — Telephone Encounter (Signed)
Tried to call patient, no answer, unable to leave a message.

## 2018-09-05 NOTE — Telephone Encounter (Signed)
CPE scheduled  

## 2018-09-06 ENCOUNTER — Ambulatory Visit: Admission: RE | Admit: 2018-09-06 | Payer: Managed Care, Other (non HMO) | Source: Ambulatory Visit

## 2018-09-12 ENCOUNTER — Inpatient Hospital Stay: Payer: Managed Care, Other (non HMO) | Attending: Nurse Practitioner | Admitting: Nurse Practitioner

## 2018-09-12 ENCOUNTER — Other Ambulatory Visit: Payer: Self-pay

## 2018-09-12 ENCOUNTER — Ambulatory Visit
Admission: RE | Admit: 2018-09-12 | Discharge: 2018-09-12 | Disposition: A | Discharge status: Home / Self Care | Payer: Managed Care, Other (non HMO) | Source: Ambulatory Visit | Attending: Oncology | Admitting: Oncology

## 2018-09-12 DIAGNOSIS — Z87891 Personal history of nicotine dependence: Secondary | ICD-10-CM | POA: Diagnosis not present

## 2018-09-12 DIAGNOSIS — Z122 Encounter for screening for malignant neoplasm of respiratory organs: Secondary | ICD-10-CM

## 2018-09-12 NOTE — Progress Notes (Signed)
In accordance with CMS guidelines, patient has met eligibility criteria including age, absence of signs or symptoms of lung cancer.  Social History   Tobacco Use  . Smoking status: Current Every Day Smoker    Packs/day: 1.00    Years: 40.00    Pack years: 40.00    Types: Cigarettes  . Smokeless tobacco: Never Used  . Tobacco comment: Pt wants to do hypnotizing  Substance Use Topics  . Alcohol use: Yes    Alcohol/week: 1.0 - 2.0 standard drinks    Types: 1 - 2 Standard drinks or equivalent per week  . Drug use: No      A shared decision-making session was conducted prior to the performance of CT scan. This includes one or more decision aids, includes benefits and harms of screening, follow-up diagnostic testing, over-diagnosis, false positive rate, and total radiation exposure.   Counseling on the importance of adherence to annual lung cancer LDCT screening, impact of co-morbidities, and ability or willingness to undergo diagnosis and treatment is imperative for compliance of the program.   Counseling on the importance of continued smoking cessation for former smokers; the importance of smoking cessation for current smokers, and information about tobacco cessation interventions have been given to patient including [place] and 1800 quit [place] programs.   Written order for lung cancer screening with LDCT has been given to the patient and any and all questions have been answered to the best of my abilities.    Yearly follow up will be coordinated by Burgess Estelle, Thoracic Navigator.  Beckey Rutter, DNP, AGNP-C [place] [facility] [phone] (work cell) [phone] (office) 09/12/18 1:48 PM

## 2018-09-13 ENCOUNTER — Telehealth: Payer: Self-pay | Admitting: General Surgery

## 2018-09-13 NOTE — Telephone Encounter (Signed)
Patient was calling to let you know she went for her cat scan yesterday.

## 2018-09-16 ENCOUNTER — Telehealth: Payer: Self-pay | Admitting: *Deleted

## 2018-09-16 DIAGNOSIS — I714 Abdominal aortic aneurysm, without rupture, unspecified: Secondary | ICD-10-CM

## 2018-09-16 NOTE — Telephone Encounter (Signed)
Patient notified and verbalized understanding.  Notified someone will call her to set up U/S

## 2018-09-16 NOTE — Telephone Encounter (Signed)
Please let her know that her CT screening showed her aorta (the big artery that comes out of her heart) is a little big- we want to make sure this is nothing to worry about, so I'm going to get her set up for an [place] to see exactly how big is is since the CT didn't show that.

## 2018-09-16 NOTE — Addendum Note (Signed)
Addended by: Valerie Roys on: 09/16/2018 09:29 AM   Modules accepted: Orders

## 2018-09-16 NOTE — Telephone Encounter (Signed)
Notified patient of LDCT lung cancer screening program results with recommendation for 12 month follow up imaging. Also notified of incidental findings noted below and is encouraged to discuss further with PCP who will receive a copy of this note and/or the CT report. Patient verbalizes understanding.   IMPRESSION: 1. Lung-RADS 2S, benign appearance or behavior. Continue annual screening with low-dose chest CT without contrast in 12 months. 2. The "S" modifier above refers to potentially clinically significant non lung cancer related findings. Specifically, there is aortic atherosclerosis, in addition to left main and left circumflex coronary artery disease. Assessment for potential risk factor modification, dietary therapy or pharmacologic therapy may be warranted, if clinically indicated. 3. Mild diffuse bronchial wall thickening with mild centrilobular and paraseptal emphysema; imaging findings suggestive of underlying COPD. 4. Incompletely imaged abdominal aortic aneurysm measuring at least 3.7 x 3.4 cm. Follow-up ultrasound of the abdominal aorta is recommended at this time to establish a baseline for future follow-up.  Aortic Atherosclerosis (ICD10-I70.0), Aortic aneurysm NOS (ICD10-I71.9) and Emphysema (ICD10-J43.9).

## 2018-09-16 NOTE — Telephone Encounter (Signed)
Patient called back and stated that she got a message from you on Friday but didn't have her phone on her, please call her when you get a chance.

## 2018-09-17 ENCOUNTER — Telehealth: Payer: Self-pay | Admitting: General Surgery

## 2018-09-17 NOTE — Telephone Encounter (Signed)
I reviewed the results of the recently completed screening.  No lung cancer identified.  One-year follow-up recommended.  0.7 infrarenal abdominal aortic aneurysm, within the limits of the scan technique.  Abdominal ultrasound recommended.  Patient blood pressure with her PCP last fall was excellent.  Up a little bit in May 2021 here with diastolic 90.  Have sent a message to her PCP and spoke with Melinda Crutch, RN at the cancer center who arranged for the chest CT as the CT result did not wrap back to me primarily.  The patient will contact the office if she is not scheduled for an abdominal ultrasound of the aorta in the next 48 hours.

## 2018-09-30 ENCOUNTER — Ambulatory Visit
Admission: RE | Admit: 2018-09-30 | Discharge: 2018-09-30 | Disposition: A | Discharge status: Home / Self Care | Payer: Managed Care, Other (non HMO) | Source: Ambulatory Visit | Attending: General Surgery | Admitting: General Surgery

## 2018-09-30 ENCOUNTER — Other Ambulatory Visit: Payer: Self-pay

## 2018-09-30 DIAGNOSIS — Z853 Personal history of malignant neoplasm of breast: Secondary | ICD-10-CM | POA: Diagnosis not present

## 2018-10-01 ENCOUNTER — Ambulatory Visit
Admission: RE | Admit: 2018-10-01 | Discharge: 2018-10-01 | Disposition: A | Discharge status: Home / Self Care | Payer: Managed Care, Other (non HMO) | Source: Ambulatory Visit | Attending: Family Medicine | Admitting: Family Medicine

## 2018-10-01 DIAGNOSIS — I714 Abdominal aortic aneurysm, without rupture, unspecified: Secondary | ICD-10-CM

## 2018-10-01 NOTE — Telephone Encounter (Signed)
Patient is calling said she had her bone density test done 09/30/2018 and had her u/s done this morning 10/01/2018. Patient said she was just calling to inform you of this information.

## 2018-10-02 NOTE — Telephone Encounter (Signed)
Case reviewed with vascular surgery.  Recommendation for follow-up abdominal ultrasound in 1 year.  We will arrange a referral to [facility] for July 2021 for assessment.

## 2018-10-18 ENCOUNTER — Encounter: Payer: Self-pay | Admitting: General Surgery

## 2018-10-21 ENCOUNTER — Telehealth: Payer: Self-pay | Admitting: General Surgery

## 2018-10-21 NOTE — Telephone Encounter (Signed)
Patient has called and stated that she would like to speak with Caryl-lyn only. I did advise the patient she was in clinic at this time. Patient did not want to leave a detailed message at this time. She would like a call back at [phone].

## 2018-11-11 ENCOUNTER — Other Ambulatory Visit: Payer: Self-pay

## 2018-11-11 ENCOUNTER — Encounter: Payer: Self-pay | Admitting: Family Medicine

## 2018-11-11 ENCOUNTER — Ambulatory Visit (INDEPENDENT_AMBULATORY_CARE_PROVIDER_SITE_OTHER): Payer: Managed Care, Other (non HMO) | Admitting: Family Medicine

## 2018-11-11 VITALS — BP 120/71 | HR 77 | Temp 98.4°F | Ht 63.0 in | Wt 147.0 lb

## 2018-11-11 DIAGNOSIS — Z0000 Encounter for general adult medical examination without abnormal findings: Secondary | ICD-10-CM

## 2018-11-11 DIAGNOSIS — Z853 Personal history of malignant neoplasm of breast: Secondary | ICD-10-CM

## 2018-11-11 DIAGNOSIS — E782 Mixed hyperlipidemia: Secondary | ICD-10-CM

## 2018-11-11 DIAGNOSIS — F411 Generalized anxiety disorder: Secondary | ICD-10-CM

## 2018-11-11 DIAGNOSIS — M329 Systemic lupus erythematosus, unspecified: Secondary | ICD-10-CM

## 2018-11-11 DIAGNOSIS — J449 Chronic obstructive pulmonary disease, unspecified: Secondary | ICD-10-CM

## 2018-11-11 DIAGNOSIS — I714 Abdominal aortic aneurysm, without rupture, unspecified: Secondary | ICD-10-CM

## 2018-11-11 DIAGNOSIS — E559 Vitamin D deficiency, unspecified: Secondary | ICD-10-CM

## 2018-11-11 MED ORDER — ALPRAZOLAM 0.5 MG PO TABS: 0.5000 mg | ORAL_TABLET | Freq: Two times a day (BID) | ORAL | 2 refills | Status: DC | PRN

## 2018-11-11 MED ORDER — BUDESONIDE-FORMOTEROL FUMARATE 160-4.5 MCG/ACT IN AERO: 2.0000 | INHALATION_SPRAY | Freq: Two times a day (BID) | RESPIRATORY_TRACT | 6 refills | Status: DC

## 2018-11-11 MED ORDER — SPIRIVA HANDIHALER 18 MCG IN CAPS: 18.0000 ug | ORAL_CAPSULE | Freq: Every day | RESPIRATORY_TRACT | 12 refills | Status: DC

## 2018-11-11 NOTE — Progress Notes (Signed)
BP 120/71   Pulse 77   Temp 98.4 F (36.9 C) (Oral)   Ht 5\' 3"  (1.6 m)   Wt 147 lb (66.7 kg)   SpO2 94%   BMI 26.04 kg/m    Subjective:    Patient ID: Maureen [name], female    DOB: 30-Apr-1945, 73 y.o.   MRN: [id number]  HPI: Maureen [name] is a 73 y.o. female presenting on 11/11/2018 for comprehensive medical examination. Current medical complaints include:  ANXIETY/STRESS- not feeling well in that her breast surgeon retired. She had to go buy another car. Has been taking her xanax at bedtime and a couple times a week during the day Duration:exacerbated Anxious mood: yes  Excessive worrying: yes Irritability: no  Sweating: no Nausea: no Palpitations:no Hyperventilation: no Panic attacks: no Agoraphobia: no  Obscessions/compulsions: no Depressed mood: no Depression screen [facility] 2/9 11/11/2018 06/19/2018 03/07/2018 12/10/2017 08/07/2017  Decreased Interest 0 1 0 0 0  Down, Depressed, Hopeless 1 1 0 0 1  PHQ - 2 Score 1 2 0 0 1  Altered sleeping 1 0 0 1 1  Tired, decreased energy 0 0 0 0 0  Change in appetite 2 0 1 0 1  Feeling bad or failure about yourself  0 0 0 0 0  Trouble concentrating 0 0 1 0 0  Moving slowly or fidgety/restless 0 0 0 0 0  Suicidal thoughts 0 0 0 0 0  PHQ-9 Score 4 2 2 1 3   Difficult doing work/chores Somewhat difficult Not difficult at all - Not difficult at all Somewhat difficult  Some recent data might be [name]   GAD 7 : Generalized Anxiety Score 11/11/2018 06/19/2018 03/07/2018 12/10/2017  Nervous, Anxious, on Edge 1 1 1 1   Control/stop worrying 1 0 0 0  Worry too much - different things 0 0 0 0  Trouble relaxing 0 1 1 0  Restless 0 0 0 0  Easily annoyed or irritable 0 0 0 0  Afraid - awful might happen 0 0 0 0  Total GAD 7 Score 2 2 2 1   Anxiety Difficulty Not difficult at all Not difficult at all Not difficult at all Not difficult at all   Anhedonia: no Weight changes: no Insomnia: no   Hypersomnia: no Fatigue/loss of energy: no  Feelings of worthlessness: no Feelings of guilt: no Impaired concentration/indecisiveness: no Suicidal ideations: no  Crying spells: no Recent Stressors/Life Changes: yes   Relationship problems: no   Family stress: no     Financial stress: no    Job stress: no    Recent death/loss: no  HYPERLIPIDEMIA Hyperlipidemia status: excellent compliance Satisfied with current treatment?  yes Side effects:  no Medication compliance: excellent compliance Past cholesterol meds: none Supplements: fish oil Aspirin:  no The 10-year ASCVD risk score Mikey Bussing DC Jr., et al., 2013) is: 17%   Values used to calculate the score:     Age: 35 years     Sex: Female     Is Non-Hispanic African American: No     Diabetic: No     Tobacco smoker: Yes     Systolic Blood Pressure: 132 mmHg     Is BP treated: No     HDL Cholesterol: 47 mg/dL     Total Cholesterol: 257 mg/dL Chest pain:  no Coronary artery disease:  no  COPD COPD status: controlled Satisfied with current treatment?: yes Oxygen use: no Dyspnea frequency: in the AM before taking the inhalers Cough frequency: rarely  Rescue inhaler frequency: rarely   Limitation of activity: no Productive cough: none Pneumovax: Up to Date Influenza: Get in the fall  Menopausal Symptoms: no  Depression Screen done today and results listed below:  Depression screen [facility] 2/9 11/11/2018 06/19/2018 03/07/2018 12/10/2017 08/07/2017  Decreased Interest 0 1 0 0 0  Down, Depressed, Hopeless 1 1 0 0 1  PHQ - 2 Score 1 2 0 0 1  Altered sleeping 1 0 0 1 1  Tired, decreased energy 0 0 0 0 0  Change in appetite 2 0 1 0 1  Feeling bad or failure about yourself  0 0 0 0 0  Trouble concentrating 0 0 1 0 0  Moving slowly or fidgety/restless 0 0 0 0 0  Suicidal thoughts 0 0 0 0 0  PHQ-9 Score 4 2 2 1 3   Difficult doing work/chores Somewhat difficult Not difficult at all - Not difficult at all Somewhat difficult  Some recent data might be [name]    Past Medical  History:  Past Medical History:  Diagnosis Date  . Arthritis 2005  . 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 This was a histologic grade 2 tumor measuring just under 1 cm in diameter. No invasive cancer was identified. ER 90%, PR 40%.  . Breast cancer, right breast ([place]) 1994   Invasive ductal carcinoma.The patient underwent right mastectomy in 1994 for invasive ductal carcinoma.  . Discoid lupus 2005  . GERD (gastroesophageal reflux disease)   . Heart murmur 2005  . Hypercholesterolemia 2012  . Mammographic microcalcification 2011  . Panic attack   . Personal history of chemotherapy 1994  . Personal history of colonic polyps    Colon polyps  . Personal history of tobacco use, presenting hazards to health   . PUD (peptic ulcer disease)    Last EGD 1/09  . Rectal polyp 2008   villous adenoma; last colonoscopy 1/09 WNL  . Skin cancer, basal cell 2015   right cheek [facility]s surgery at [facility]  . Ulcer     Surgical History:  Past Surgical History:  Procedure Laterality Date  . APPENDECTOMY  2011  . BREAST BIOPSY  12/27/2009   right breast  . CARDIAC CATHETERIZATION  1996   Negative Cath  . COLONOSCOPY N/A 08/05/2014   Procedure: COLONOSCOPY;  Surgeon: [name], MD;  Location: [facility] ENDOSCOPY;  EGD, tubular adenoma 1.  . COLONOSCOPY W/ BIOPSIES  2005,2009   Dr. Bary Castilla. Normal exam 2009. Villous adenoma of the rectum in 2005 adenomatous polyp from the ascending colon and rectum in 2002.  . ESOPHAGOGASTRODUODENOSCOPY N/A 08/05/2014   Procedure: ESOPHAGOGASTRODUODENOSCOPY (EGD);  Surgeon: [name], MD;  Location: [facility] ENDOSCOPY;  Service: Endoscopy;  Laterality: N/A;  . ESOPHAGOGASTRODUODENOSCOPY (EGD) WITH PROPOFOL N/A 07/25/2017   Procedure: ESOPHAGOGASTRODUODENOSCOPY (EGD) WITH PROPOFOL;  Surgeon: [name], MD;  Location: [facility] ENDOSCOPY;  Service: Endoscopy;  Laterality: N/A;  .  MASTECTOMY     bilateral  . SIMPLE MASTECTOMY  1993   right  . SIMPLE MASTECTOMY  2011   left  . TONSILLECTOMY  1997  . UPPER GI ENDOSCOPY  2003, 2005, 2009, 2012, 2014   2012 showed mild reflux changes without evidence of Barrett's epithelial changes 2014 biopsies at 37 cm showed changes suggestive of reflux esophagitis. No metaplasia.  Marland Kitchen VAGINAL HYSTERECTOMY  1977    Medications:  Current Outpatient Medications on File Prior to Visit  Medication  Sig  . acetaminophen (TYLENOL) 500 MG tablet Take 500 mg by mouth every 6 (six) hours as needed for headache.  . albuterol (PROVENTIL HFA;VENTOLIN HFA) 108 (90 Base) MCG/ACT inhaler TAKE 2 PUFFS BY MOUTH EVERY 6 HOURS AS NEEDED FOR WHEEZE OR SHORTNESS OF BREATH  . Ascorbic Acid (VITAMIN C) 100 MG tablet Take 100 mg by mouth daily.  . Calcium-Vitamins C & D (CALCIUM/C/D PO) Take by mouth daily.  . Cholecalciferol (VITAMIN D3) 50 MCG (2000 UT) TABS Take by mouth.  . clobetasol cream (TEMOVATE) 5.95 % Apply 1 application topically daily.  . Homeopathic Products (ZICAM ALLERGY RELIEF NA) Place 2 sprays into the nose daily as needed (allergies).   Marland Kitchen KRILL OIL PO Take by mouth daily.  . Multiple Vitamin (MULTIVITAMIN PO) Take by mouth daily.  Marland Kitchen omeprazole (PRILOSEC) 20 MG capsule Take 20 mg by mouth daily.  Marland Kitchen RA KRILL OIL 500 MG CAPS Take 500 mg by mouth daily.  Marland Kitchen zinc gluconate 50 MG tablet Take 50 mg by mouth every other day.   No current facility-administered medications on file prior to visit.     Allergies:  Allergies  Allergen Reactions  . Codeine Itching  . Sulfa Antibiotics Hives  . Sulfinpyrazone   . Erythromycin Rash  . Penicillins Rash    Social History:  Social History   Socioeconomic History  . Marital status: Widowed    Spouse name: Not on file  . Number of children: Not on file  . Years of education: Not on file  . Highest education level: Not on file  Occupational History  . 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  Social Needs  . Financial resource strain: Not on file  . Food insecurity    Worry: Not on file    Inability: Not on file  . Transportation needs    Medical: Not on file    Non-medical: Not on file  Tobacco Use  . Smoking status: Current Every Day Smoker    Packs/day: 0.50    Years: 40.00    Pack years: 20.00    Types: Cigarettes  . Smokeless tobacco: Never Used  . Tobacco comment: Pt wants to do hypnotizing  Substance and Sexual Activity  . Alcohol use: Yes    Alcohol/week: 1.0 - 2.0 standard drinks    Types: 1 - 2 Standard drinks or equivalent per week  . Drug use: No  . Sexual activity: Not Currently  Lifestyle  . Physical activity    Days per week: Not on file    Minutes per session: Not on file  . Stress: Not on file  Relationships  . Social Herbalist on phone: Not on file    Gets together: Not on file    Attends religious service: Not on file    Active member of club or organization: Not on file    Attends meetings of clubs or organizations: Not on file    Relationship status: Not on file  . Intimate partner violence    Fear of current or ex partner: Not on file    Emotionally abused: Not on file    Physically abused: Not on file    Forced sexual activity: Not on file  Other Topics Concern  . Not on file  Social History Narrative   Naomee grew up in [place]. She has two adult daughters. She lives in her home. Her  youngest daughter and grandchildren live with her. She has two dogs. 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 She loves watching her grandson play basketball. She also loves gardening and being outdoors and working in the yard.    Social History   Tobacco Use  Smoking Status Current Every Day Smoker  . Packs/day: 0.50  . Years: 40.00  . Pack years: 20.00  . Types: Cigarettes  Smokeless Tobacco Never Used  Tobacco Comment    Pt wants to do hypnotizing   Social History   Substance and Sexual Activity  Alcohol Use Yes  . Alcohol/week: 1.0 - 2.0 standard drinks  . Types: 1 - 2 Standard drinks or equivalent per week    Family History:  Family History  Problem Relation Age of Onset  . Cancer Father        esophagus  . Cancer Maternal Aunt        breast cancer  . Cancer Maternal Grandmother        breast cancer    Past medical history, surgical history, medications, allergies, family history and social history reviewed with patient today and changes made to appropriate areas of the chart.   Review of Systems  Constitutional: Negative.   HENT: Negative.   Eyes: Negative.   Respiratory: Negative.   Cardiovascular: Positive for palpitations. Negative for chest pain, orthopnea, claudication, leg swelling and PND.  Gastrointestinal: Negative.   Genitourinary: Negative.   Musculoskeletal: Negative.   Skin: Negative.  Negative for itching and rash.  Neurological: Negative.   Endo/Heme/Allergies: Negative.   Psychiatric/Behavioral: Negative for depression, hallucinations, memory loss, substance abuse and suicidal ideas. The patient is nervous/anxious and has insomnia.     All other ROS negative except what is listed above and in the HPI.      Objective:    BP 120/71   Pulse 77   Temp 98.4 F (36.9 C) (Oral)   Ht 5\' 3"  (1.6 m)   Wt 147 lb (66.7 kg)   SpO2 94%   BMI 26.04 kg/m   Wt Readings from Last 3 Encounters:  11/11/18 147 lb (66.7 kg)  09/12/18 147 lb (66.7 kg)  08/06/18 156 lb (70.8 kg)    Physical Exam Vitals signs and nursing note reviewed.  Constitutional:      General: She is not in acute distress.    Appearance: Normal appearance. She is not ill-appearing, toxic-appearing or diaphoretic.  HENT:     Head: Normocephalic and atraumatic.     Right Ear: Tympanic membrane, ear canal and external ear normal. There is no impacted cerumen.     Left Ear: Tympanic membrane, ear canal  and external ear normal. There is no impacted cerumen.     Nose: Nose normal. No congestion or rhinorrhea.     Mouth/Throat:     Mouth: Mucous membranes are moist.     Pharynx: Oropharynx is clear. No oropharyngeal exudate or posterior oropharyngeal erythema.  Eyes:     General: No scleral icterus.       Right eye: No discharge.        Left eye: No discharge.     Extraocular Movements: Extraocular movements intact.     Conjunctiva/sclera: Conjunctivae normal.     Pupils: Pupils are equal, round, and reactive to light.  Neck:     Musculoskeletal: Normal range of motion and neck supple. No neck rigidity or muscular tenderness.     Vascular: No carotid bruit.  Cardiovascular:  Rate and Rhythm: Normal rate and regular rhythm.     Pulses: Normal pulses.     Heart sounds: No murmur. No friction rub. No gallop.   Pulmonary:     Effort: Pulmonary effort is normal. No respiratory distress.     Breath sounds: Normal breath sounds. No stridor. No wheezing, rhonchi or rales.  Chest:     Chest wall: No tenderness.  Abdominal:     General: Abdomen is flat. Bowel sounds are normal. There is no distension.     Palpations: Abdomen is soft. There is no mass.     Tenderness: There is no abdominal tenderness. There is no right CVA tenderness, left CVA tenderness, guarding or rebound.     Hernia: No hernia is present.  Genitourinary:    Comments: Breast and pelvic exams deferred with shared decision making Musculoskeletal:        General: No swelling, tenderness, deformity or signs of injury.     Right lower leg: No edema.     Left lower leg: No edema.  Lymphadenopathy:     Cervical: No cervical adenopathy.  Skin:    General: Skin is warm and dry.     Capillary Refill: Capillary refill takes less than 2 seconds.     Coloration: Skin is not jaundiced or pale.     Findings: No bruising, erythema, lesion or rash.  Neurological:     General: No focal deficit present.     Mental Status: She is  alert and oriented to person, place, and time. Mental status is at baseline.     Cranial Nerves: No cranial nerve deficit.     Sensory: No sensory deficit.     Motor: No weakness.     Coordination: Coordination normal.     Gait: Gait normal.     Deep Tendon Reflexes: Reflexes normal.  Psychiatric:        Mood and Affect: Mood normal.        Behavior: Behavior normal.        Thought Content: Thought content normal.        Judgment: Judgment normal.     Results for orders placed or performed in visit on 04/16/18  VITAMIN D 25 Hydroxy (Vit-D Deficiency, Fractures)  Result Value Ref Range   Vit D, 25-Hydroxy 12.8 (L) 30.0 - 100.0 ng/mL  Lipid Panel With LDL/HDL Ratio  Result Value Ref Range   Cholesterol, Total 257 (H) 100 - 199 mg/dL   Triglycerides 151 (H) 0 - 149 mg/dL   HDL 47 >39 mg/dL   VLDL Cholesterol Cal 30 5 - 40 mg/dL   LDL Calculated 180 (H) 0 - 99 mg/dL   LDl/HDL Ratio 3.8 (H) 0.0 - 3.2 ratio  Comprehensive metabolic panel  Result Value Ref Range   Glucose 97 65 - 99 mg/dL   BUN 13 8 - 27 mg/dL   Creatinine, Ser 1.00 0.57 - 1.00 mg/dL   GFR calc non Af Amer 57 (L) >59 mL/min/1.73   GFR calc Af Amer 65 >59 mL/min/1.73   BUN/Creatinine Ratio 13 12 - 28   Sodium 141 134 - 144 mmol/L   Potassium 4.3 3.5 - 5.2 mmol/L   Chloride 104 96 - 106 mmol/L   CO2 20 20 - 29 mmol/L   Calcium 10.2 8.7 - 10.3 mg/dL   Total Protein 6.8 6.0 - 8.5 g/dL   Albumin 4.5 3.7 - 4.7 g/dL   Globulin, Total 2.3 1.5 - 4.5 g/dL   Albumin/Globulin Ratio 2.0  1.2 - 2.2   Bilirubin Total 0.4 0.0 - 1.2 mg/dL   Alkaline Phosphatase 97 39 - 117 IU/L   AST 15 0 - 40 IU/L   ALT 22 0 - 32 IU/L      Assessment & Plan:   Problem List Items Addressed This Visit      Cardiovascular and Mediastinum   Abdominal aortic aneurysm (AAA) without rupture ([place])    Newly diagnosed. 3.9 on [place]- due for follow up US in July 2022.        Respiratory   COPD (chronic obstructive pulmonary disease) with  chronic bronchitis (HCC)    Under good control on current regimen. Continue current regimen. Continue to monitor. Call with any concerns. Refills given.       Relevant Medications   budesonide-formoterol (SYMBICORT) 160-4.5 MCG/ACT inhaler   tiotropium (SPIRIVA HANDIHALER) 18 MCG inhalation capsule   Other Relevant Orders   CBC with Differential/Platelet   Comprehensive metabolic panel   TSH   UA/M w/rflx Culture, Routine     Other   Lupus (HCC)    Stable. Continue to monitor. Call with any concerns.       Relevant Orders   Comprehensive metabolic panel   TSH   UA/M w/rflx Culture, Routine   Generalized anxiety disorder    Taking her xanax about 2-3x a week during the day and daily at night. Has 1 Rx left- will give Rx to drop next month and give 45 tabs for 30 days, which should be more than enough. Refills for 3 months given. Follow up in 4 months. Continue to monitor.       Relevant Medications   ALPRAZolam (XANAX) 0.5 MG tablet (Start on 11/30/2018)   Other Relevant Orders   Comprehensive metabolic panel   TSH   UA/M w/rflx Culture, Routine   Hyperlipidemia    Rechecking levels today. Await results. Treat as needed. Patient is on OTC krill oil and does not want to take statin.       Relevant Orders   Comprehensive metabolic panel   Lipid Panel w/o Chol/HDL Ratio   TSH   UA/M w/rflx Culture, Routine   Vitamin D deficiency    Checking labs today. Will treat as needed. Call with any concerns.      Relevant Orders   Comprehensive metabolic panel   TSH   UA/M w/rflx Culture, Routine   VITAMIN D 25 Hydroxy (Vit-D Deficiency, Fractures)   History of breast cancer    Her breast surgeon retired and she is very anxious about this. New referral for breast surgeon at [facility] made today.      Relevant Orders   Ambulatory referral to General Surgery    Other Visit Diagnoses    Routine general medical examination at a health care facility    -  Primary   Vaccines up to  date. Screening labs checked today. Mammogram and Pap N/A. Colonoscopy up to date. Continue diet and exercise. Call with any concerns.        Follow up plan: Return in about 4 months (around 03/13/2019) for follow up mood.   LABORATORY TESTING:  - Pap smear: not applicable  IMMUNIZATIONS:   - Tdap: Tetanus vaccination status reviewed: last tetanus booster within 10 years. - Influenza: Postponed to flu season - Pneumovax: Up to date - Prevnar: Up to date - HPV: Not applicable - Zostavax vaccine: Given elsewhere  SCREENING: -Mammogram: N/A  - Colonoscopy: Up to date  - Bone Density: Up to  date   PATIENT COUNSELING:   Advised to take 1 mg of folate supplement per day if capable of pregnancy.   Sexuality: Discussed sexually transmitted diseases, partner selection, use of condoms, avoidance of unintended pregnancy  and contraceptive alternatives.   Advised to avoid cigarette smoking.  I discussed with the patient that most people either abstain from alcohol or drink within safe limits (<=14/week and <=4 drinks/occasion for males, <=7/weeks and <= 3 drinks/occasion for females) and that the risk for alcohol disorders and other health effects rises proportionally with the number of drinks per week and how often a drinker exceeds daily limits.  Discussed cessation/primary prevention of drug use and availability of treatment for abuse.   Diet: Encouraged to adjust caloric intake to maintain  or achieve ideal body weight, to reduce intake of dietary saturated fat and total fat, to limit sodium intake by avoiding high sodium foods and not adding table salt, and to maintain adequate dietary potassium and calcium preferably from fresh fruits, vegetables, and low-fat dairy products.    stressed the importance of regular exercise  Injury prevention: Discussed safety belts, safety helmets, smoke detector, smoking near bedding or upholstery.   Dental health: Discussed importance of regular  tooth brushing, flossing, and dental visits.    NEXT PREVENTATIVE PHYSICAL DUE IN 1 YEAR. Return in about 4 months (around 03/13/2019) for follow up mood.

## 2018-11-11 NOTE — Assessment & Plan Note (Signed)
Rechecking levels today. Await results. Treat as needed. Patient is on OTC krill oil and does not want to take statin.

## 2018-11-11 NOTE — Assessment & Plan Note (Signed)
Under good control on current regimen. Continue current regimen. Continue to monitor. Call with any concerns. Refills given.   

## 2018-11-11 NOTE — Assessment & Plan Note (Signed)
Stable. Continue to monitor. Call with any concerns.  ?

## 2018-11-11 NOTE — Patient Instructions (Signed)
Health Maintenance After Age 72 After age 72, you are at a higher risk for certain long-term diseases and infections as well as injuries from falls. Falls are a major cause of broken bones and head injuries in people who are older than age 72. Getting regular preventive care can help to keep you healthy and well. Preventive care includes getting regular testing and making lifestyle changes as recommended by your health care provider. Talk with your health care provider about:  Which screenings and tests you should have. A screening is a test that checks for a disease when you have no symptoms.  A diet and exercise plan that is right for you. What should I know about screenings and tests to prevent falls? Screening and testing are the best ways to find a health problem early. Early diagnosis and treatment give you the best chance of managing medical conditions that are common after age 72. Certain conditions and lifestyle choices may make you more likely to have a fall. Your health care provider may recommend:  Regular vision checks. Poor vision and conditions such as cataracts can make you more likely to have a fall. If you wear glasses, make sure to get your prescription updated if your vision changes.  Medicine review. Work with your health care provider to regularly review all of the medicines you are taking, including over-the-counter medicines. Ask your health care provider about any side effects that may make you more likely to have a fall. Tell your health care provider if any medicines that you take make you feel dizzy or sleepy.  Osteoporosis screening. Osteoporosis is a condition that causes the bones to get weaker. This can make the bones weak and cause them to break more easily.  Blood pressure screening. Blood pressure changes and medicines to control blood pressure can make you feel dizzy.  Strength and balance checks. Your health care provider may recommend certain tests to check your  strength and balance while standing, walking, or changing positions.  Foot health exam. Foot pain and numbness, as well as not wearing proper footwear, can make you more likely to have a fall.  Depression screening. You may be more likely to have a fall if you have a fear of falling, feel emotionally low, or feel unable to do activities that you used to do.  Alcohol use screening. Using too much alcohol can affect your balance and may make you more likely to have a fall. What actions can I take to lower my risk of falls? General instructions  Talk with your health care provider about your risks for falling. Tell your health care provider if: ? You fall. Be sure to tell your health care provider about all falls, even ones that seem minor. ? You feel dizzy, sleepy, or off-balance.  Take over-the-counter and prescription medicines only as told by your health care provider. These include any supplements.  Eat a healthy diet and maintain a healthy weight. A healthy diet includes low-fat dairy products, low-fat (lean) meats, and fiber from whole grains, beans, and lots of fruits and vegetables. Home safety  Remove any tripping hazards, such as rugs, cords, and clutter.  Install safety equipment such as grab bars in bathrooms and safety rails on stairs.  Keep rooms and walkways well-lit. Activity   Follow a regular exercise program to stay fit. This will help you maintain your balance. Ask your health care provider what types of exercise are appropriate for you.  If you need a cane or   walker, use it as recommended by your health care provider.  Wear supportive shoes that have nonskid soles. Lifestyle  Do not drink alcohol if your health care provider tells you not to drink.  If you drink alcohol, limit how much you have: ? 0-1 drink a day for women. ? 0-2 drinks a day for men.  Be aware of how much alcohol is in your drink. In the [place] one drink equals one typical bottle of beer (12  oz), one-half glass of wine (5 oz), or one shot of hard liquor (1 oz).  Do not use any products that contain nicotine or tobacco, such as cigarettes and e-cigarettes. If you need help quitting, ask your health care provider. Summary  Having a healthy lifestyle and getting preventive care can help to protect your health and wellness after age 72.  Screening and testing are the best way to find a health problem early and help you avoid having a fall. Early diagnosis and treatment give you the best chance for managing medical conditions that are more common for people who are older than age 72.  Falls are a major cause of broken bones and head injuries in people who are older than age 72. Take precautions to prevent a fall at home.  Work with your health care provider to learn what changes you can make to improve your health and wellness and to prevent falls. This information is not intended to replace advice given to you by your health care provider. Make sure you discuss any questions you have with your health care provider. Document Released: 01/17/2017 Document Revised: 06/27/2018 Document Reviewed: 01/17/2017 Elsevier Patient Education  2020 [employer]  

## 2018-11-11 NOTE — Assessment & Plan Note (Signed)
Her breast surgeon retired and she is very anxious about this. New referral for breast surgeon at [facility] made today.

## 2018-11-11 NOTE — Assessment & Plan Note (Signed)
Taking her xanax about 2-3x a week during the day and daily at night. Has 1 Rx left- will give Rx to drop next month and give 45 tabs for 30 days, which should be more than enough. Refills for 3 months given. Follow up in 4 months. Continue to monitor.

## 2018-11-11 NOTE — Assessment & Plan Note (Signed)
Checking labs today. Will treat as needed. Call with any concerns.

## 2018-11-11 NOTE — Assessment & Plan Note (Signed)
Newly diagnosed. 3.9 on [place]- due for follow up [place] in July 2022.

## 2018-11-14 ENCOUNTER — Encounter: Payer: Self-pay | Admitting: Adult Health

## 2018-11-14 ENCOUNTER — Other Ambulatory Visit: Payer: Managed Care, Other (non HMO)

## 2018-11-14 ENCOUNTER — Other Ambulatory Visit: Payer: Self-pay

## 2018-11-14 ENCOUNTER — Ambulatory Visit: Payer: Managed Care, Other (non HMO) | Admitting: Adult Health

## 2018-11-14 VITALS — BP 128/60 | HR 62 | Temp 97.9°F | Resp 18 | Ht 63.0 in | Wt 149.0 lb

## 2018-11-14 DIAGNOSIS — F411 Generalized anxiety disorder: Secondary | ICD-10-CM

## 2018-11-14 DIAGNOSIS — E559 Vitamin D deficiency, unspecified: Secondary | ICD-10-CM

## 2018-11-14 DIAGNOSIS — E782 Mixed hyperlipidemia: Secondary | ICD-10-CM | POA: Diagnosis not present

## 2018-11-14 DIAGNOSIS — J449 Chronic obstructive pulmonary disease, unspecified: Secondary | ICD-10-CM | POA: Diagnosis not present

## 2018-11-14 DIAGNOSIS — Z0189 Encounter for other specified special examinations: Secondary | ICD-10-CM | POA: Diagnosis not present

## 2018-11-14 DIAGNOSIS — Z008 Encounter for other general examination: Secondary | ICD-10-CM

## 2018-11-14 NOTE — Patient Instructions (Signed)
Same as in lab visit

## 2018-11-14 NOTE — Patient Instructions (Signed)
Follow-up given multiple chronic conditions with your primary care provider and with your specialist as they recommend or if any symptoms change or worsen or persist.  Emergency medical care should be sought with any emergency medical issues immediately and call 911 for emergency.  I will have the office call you on your glucose and cholesterol results when they return if you have not heard within 1 week please call the office.  This biometric physical is a brief physical and the only labs done are glucose and your lipid panel(cholesterol) and is  not a substitute for seeing a primary care provider for a complete annual physical. Please see a primary care physician for routine health maintenance, labs and full physical at least yearly and follow up as recommended by your provider. Provider also recommends if you do not have a primary care provider for patient to establish care as soon as possible .Patient may chose provider of choice. Also gave the [place] at [phone] or web site at [email or web address] to help assist with finding a primary care doctor.  Patient verbalizes understanding that his office is acute care only and not a substitute for a primary care or for the management of chronic conditions.    Health Maintenance, Female Adopting a healthy lifestyle and getting preventive care are important in promoting health and wellness. Ask your health care provider about:  The right schedule for you to have regular tests and exams.  Things you can do on your own to prevent diseases and keep yourself healthy. What should I know about diet, weight, and exercise? Eat a healthy diet   Eat a diet that includes plenty of vegetables, fruits, low-fat dairy products, and lean protein.  Do not eat a lot of foods that are high in solid fats, added sugars, or sodium. Maintain a healthy weight Body mass index (BMI) is used to identify weight problems. It estimates  body fat based on height and weight. Your health care provider can help determine your BMI and help you achieve or maintain a healthy weight. Get regular exercise Get regular exercise. This is one of the most important things you can do for your health. Most adults should:  Exercise for at least 150 minutes each week. The exercise should increase your heart rate and make you sweat (moderate-intensity exercise).  Do strengthening exercises at least twice a week. This is in addition to the moderate-intensity exercise.  Spend less time sitting. Even light physical activity can be beneficial. Watch cholesterol and blood lipids Have your blood tested for lipids and cholesterol at 72 years of age, then have this test every 5 years. Have your cholesterol levels checked more often if:  Your lipid or cholesterol levels are high.  You are older than 72 years of age.  You are at high risk for heart disease. What should I know about cancer screening? Depending on your health history and family history, you may need to have cancer screening at various ages. This may include screening for:  Breast cancer.  Cervical cancer.  Colorectal cancer.  Skin cancer.  Lung cancer. What should I know about heart disease, diabetes, and high blood pressure? Blood pressure and heart disease  High blood pressure causes heart disease and increases the risk of stroke. This is more likely to develop in people who have high blood pressure readings, are of African descent, or are overweight.  Have your blood pressure checked: ? Every 3-5 years if you  are 28-46 years of age. ? Every year if you are 77 years old or older. Diabetes Have regular diabetes screenings. This checks your fasting blood sugar level. Have the screening done:  Once every three years after age 70 if you are at a normal weight and have a low risk for diabetes.  More often and at a younger age if you are overweight or have a high risk for  diabetes. What should I know about preventing infection? Hepatitis B If you have a higher risk for hepatitis B, you should be screened for this virus. Talk with your health care provider to find out if you are at risk for hepatitis B infection. Hepatitis C Testing is recommended for:  Everyone born from [birth year] through 1964.  Anyone with known risk factors for hepatitis C. Sexually transmitted infections (STIs)  Get screened for STIs, including gonorrhea and chlamydia, if: ? You are sexually active and are younger than 72 years of age. ? You are older than 72 years of age and your health care provider tells you that you are at risk for this type of infection. ? Your sexual activity has changed since you were last screened, and you are at increased risk for chlamydia or gonorrhea. Ask your health care provider if you are at risk.  Ask your health care provider about whether you are at high risk for HIV. Your health care provider may recommend a prescription medicine to help prevent HIV infection. If you choose to take medicine to prevent HIV, you should first get tested for HIV. You should then be tested every 3 months for as long as you are taking the medicine. Pregnancy  If you are about to stop having your period (premenopausal) and you may become pregnant, seek counseling before you get pregnant.  Take 400 to 800 micrograms (mcg) of folic acid every day if you become pregnant.  Ask for birth control (contraception) if you want to prevent pregnancy. Osteoporosis and menopause Osteoporosis is a disease in which the bones lose minerals and strength with aging. This can result in bone fractures. If you are 43 years old or older, or if you are at risk for osteoporosis and fractures, ask your health care provider if you should:  Be screened for bone loss.  Take a calcium or vitamin D supplement to lower your risk of fractures.  Be given hormone replacement therapy (HRT) to treat symptoms of  menopause. Follow these instructions at home: Lifestyle  Do not use any products that contain nicotine or tobacco, such as cigarettes, e-cigarettes, and chewing tobacco. If you need help quitting, ask your health care provider.  Do not use street drugs.  Do not share needles.  Ask your health care provider for help if you need support or information about quitting drugs. Alcohol use  Do not drink alcohol if: ? Your health care provider tells you not to drink. ? You are pregnant, may be pregnant, or are planning to become pregnant.  If you drink alcohol: ? Limit how much you use to 0-1 drink a day. ? Limit intake if you are breastfeeding.  Be aware of how much alcohol is in your drink. In the [place] one drink equals one 12 oz bottle of beer (355 mL), one 5 oz glass of wine (148 mL), or one 1 oz glass of hard liquor (44 mL). General instructions  Schedule regular health, dental, and eye exams.  Stay current with your vaccines.  Tell your health care provider if: ?  You often feel depressed. ? You have ever been abused or do not feel safe at home. Summary  Adopting a healthy lifestyle and getting preventive care are important in promoting health and wellness.  Follow your health care provider's instructions about healthy diet, exercising, and getting tested or screened for diseases.  Follow your health care provider's instructions on monitoring your cholesterol and blood pressure. This information is not intended to replace advice given to you by your health care provider. Make sure you discuss any questions you have with your health care provider. Document Released: 09/19/2010 Document Revised: 02/27/2018 Document Reviewed: 02/27/2018 Elsevier Patient Education  2020 [employer].

## 2018-11-14 NOTE — Progress Notes (Addendum)
[facility]  Blood pressure 128/60, pulse 62, temperature 97.9 F (36.6 C), temperature source Temporal, resp. rate 18, height 5\' 3"  (1.6 m), weight 149 lb (67.6 kg), SpO2 97 %. Body mass index is 26.39 kg/m. Maureen [name] DOB: 73 y.o. MRN: [id number]  Subjective:  Here for Biometric Screen/brief exam Patient is a 73 year old female in no acute distress who comes to the clinic for her biometric screening and brief physical, she reports that she had a physical on Monday with her primary care provider.  She reports that she has no further concerns at this visit she discussed tomorrow with her primary care provider. 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 She does not plan to retire anytime soon as she enjoys working. Medications, allergies and chronic medical problems reviewed. Patient  denies any fever, body aches,chills, rash, chest pain, shortness of breath, nausea, vomiting, or diarrhea.  Objective: NAD well-developed well-nourished HEENT: Within normal limits Neck: Normal, supple no cervical lymphadenopathy Heart: Regular rate and rhythm no murmurs rubs or gallops Lungs: Clear to auscultations without any adventitious lung sounds  Assessment: Biometric screen Chronic obstructive bronchitis (HCC) - Plan: UA/M w/rflx Culture, Comp, CBC with Differential/Platelet, Comprehensive metabolic panel, Lipid Panel With LDL/HDL Ratio, TSH, VITAMIN D 25 Hydroxy (Vit-D Deficiency, Fractures)  Generalized anxiety disorder - Plan: UA/M w/rflx Culture, Comp, CBC with Differential/Platelet, Comprehensive metabolic panel, Lipid Panel With LDL/HDL Ratio, TSH, VITAMIN D 25 Hydroxy (Vit-D Deficiency, Fractures)  Mixed hyperlipidemia - Plan: UA/M w/rflx Culture, Comp, CBC with Differential/Platelet, Comprehensive metabolic panel, Lipid Panel With  LDL/HDL Ratio, TSH, VITAMIN D 25 Hydroxy (Vit-D Deficiency, Fractures)  Vitamin D deficiency - Plan: UA/M w/rflx Culture, Comp, CBC with Differential/Platelet, Comprehensive metabolic panel, Lipid Panel With LDL/HDL Ratio, TSH, VITAMIN D 25 Hydroxy (Vit-D Deficiency, Fractures)  Encounter for biometric screening  Encounter for other general examination-brief biometric exam with biometric physical this is not an annual full physical   Plan: Follow-up with your primary care and your specialist given multiple chronic conditions as they recommend or if you have any new or changing symptoms. Fasting glucose and lipids. Discussed with patient that today's visit here is a limited biometric screening visit (not a comprehensive exam or management of any chronic problems) Discussed some health issues, including healthy eating habits and exercise. Encouraged to follow-up with PCP for annual comprehensive preventive and wellness care (and if applicable, any chronic issues). Questions invited and answered.  I will have the office call you on your glucose and cholesterol results when they return if you have not heard within 1 week please call the office.  This biometric physical is a brief physical and the only labs done are glucose and your lipid panel(cholesterol) and is  not a substitute for seeing a primary care provider for a complete annual physical. Please see a primary care physician for routine health maintenance, labs and full physical at least yearly and follow up as recommended by your provider. Provider also recommends if you do not have a primary care provider for patient to establish care as soon as possible .Patient may chose provider of choice. Also gave the [place] at [phone] or web site at [email or web address] to help assist with finding a primary care doctor.  Patient verbalizes understanding that his office is acute care only and not a substitute  for a primary  care or for the management of chronic conditions.

## 2018-11-14 NOTE — Progress Notes (Addendum)
Lab visit

## 2018-11-15 ENCOUNTER — Encounter: Payer: Self-pay | Admitting: Family Medicine

## 2018-11-15 LAB — COMPREHENSIVE METABOLIC PANEL
ALT: 15 IU/L (ref 0–32)
AST: 15 IU/L (ref 0–40)
Albumin/Globulin Ratio: 2.4 — ABNORMAL HIGH (ref 1.2–2.2)
Albumin: 4.4 g/dL (ref 3.7–4.7)
Alkaline Phosphatase: 81 IU/L (ref 39–117)
BUN/Creatinine Ratio: 17 (ref 12–28)
BUN: 17 mg/dL (ref 8–27)
Bilirubin Total: 0.2 mg/dL (ref 0.0–1.2)
CO2: 22 mmol/L (ref 20–29)
Calcium: 9.8 mg/dL (ref 8.7–10.3)
Chloride: 103 mmol/L (ref 96–106)
Creatinine, Ser: 1.01 mg/dL — ABNORMAL HIGH (ref 0.57–1.00)
GFR calc Af Amer: 64 mL/min/{1.73_m2} (ref >59)
GFR calc non Af Amer: 56 mL/min/{1.73_m2} — ABNORMAL LOW (ref >59)
Globulin, Total: 1.8 g/dL (ref 1.5–4.5)
Glucose: 88 mg/dL (ref 65–99)
Potassium: 4.4 mmol/L (ref 3.5–5.2)
Sodium: 141 mmol/L (ref 134–144)
Total Protein: 6.2 g/dL (ref 6.0–8.5)

## 2018-11-15 LAB — CBC WITH DIFFERENTIAL/PLATELET
Basophils Absolute: 0 10*3/uL (ref 0.0–0.2)
Basos: 1 %
EOS (ABSOLUTE): 0.1 10*3/uL (ref 0.0–0.4)
Eos: 2 %
Hematocrit: 40.5 % (ref 34.0–46.6)
Hemoglobin: 13.5 g/dL (ref 11.1–15.9)
Immature Grans (Abs): 0 10*3/uL (ref 0.0–0.1)
Immature Granulocytes: 0 %
Lymphocytes Absolute: 2 10*3/uL (ref 0.7–3.1)
Lymphs: 32 %
MCH: 32 pg (ref 26.6–33.0)
MCHC: 33.3 g/dL (ref 31.5–35.7)
MCV: 96 fL (ref 79–97)
Monocytes Absolute: 0.4 10*3/uL (ref 0.1–0.9)
Monocytes: 7 %
Neutrophils Absolute: 3.7 10*3/uL (ref 1.4–7.0)
Neutrophils: 58 %
Platelets: 180 10*3/uL (ref 150–450)
RBC: 4.22 x10E6/uL (ref 3.77–5.28)
RDW: 13.3 % (ref 11.7–15.4)
WBC: 6.3 10*3/uL (ref 3.4–10.8)

## 2018-11-15 LAB — LIPID PANEL WITH LDL/HDL RATIO
Cholesterol, Total: 234 mg/dL — ABNORMAL HIGH (ref 100–199)
HDL: 48 mg/dL (ref >39)
LDL Calculated: 159 mg/dL — ABNORMAL HIGH (ref 0–99)
LDl/HDL Ratio: 3.3 ratio — ABNORMAL HIGH (ref 0.0–3.2)
Triglycerides: 137 mg/dL (ref 0–149)
VLDL Cholesterol Cal: 27 mg/dL (ref 5–40)

## 2018-11-15 LAB — TSH: TSH: 1.36 u[IU]/mL (ref 0.450–4.500)

## 2018-11-15 LAB — VITAMIN D 25 HYDROXY (VIT D DEFICIENCY, FRACTURES): Vit D, 25-Hydroxy: 36.2 ng/mL (ref 30.0–100.0)

## 2018-11-17 LAB — MICROSCOPIC EXAMINATION: Casts: NONE SEEN /lpf

## 2018-11-17 LAB — UA/M W/RFLX CULTURE, COMP
Bilirubin, UA: NEGATIVE
Glucose, UA: NEGATIVE
Ketones, UA: NEGATIVE
Leukocytes,UA: NEGATIVE
Nitrite, UA: NEGATIVE
Protein,UA: NEGATIVE
RBC, UA: NEGATIVE
Specific Gravity, UA: 1.015 (ref 1.005–1.030)
Urobilinogen, Ur: 0.2 mg/dL (ref 0.2–1.0)
pH, UA: 5.5 (ref 5.0–7.5)

## 2018-11-17 LAB — URINE CULTURE, COMPREHENSIVE

## 2018-12-11 ENCOUNTER — Other Ambulatory Visit: Payer: Self-pay | Admitting: Family Medicine

## 2019-02-27 ENCOUNTER — Other Ambulatory Visit: Payer: Self-pay

## 2019-02-27 ENCOUNTER — Ambulatory Visit (INDEPENDENT_AMBULATORY_CARE_PROVIDER_SITE_OTHER): Payer: Managed Care, Other (non HMO) | Admitting: Family Medicine

## 2019-02-27 ENCOUNTER — Ambulatory Visit: Payer: Managed Care, Other (non HMO) | Admitting: Family Medicine

## 2019-02-27 ENCOUNTER — Ambulatory Visit: Payer: Self-pay

## 2019-02-27 ENCOUNTER — Encounter: Payer: Self-pay | Admitting: Family Medicine

## 2019-02-27 DIAGNOSIS — Z20828 Contact with and (suspected) exposure to other viral communicable diseases: Secondary | ICD-10-CM

## 2019-02-27 DIAGNOSIS — Z20822 Contact with and (suspected) exposure to covid-19: Secondary | ICD-10-CM

## 2019-02-27 DIAGNOSIS — F411 Generalized anxiety disorder: Secondary | ICD-10-CM

## 2019-02-27 MED ORDER — ALPRAZOLAM 0.5 MG PO TABS: 0.5000 mg | ORAL_TABLET | Freq: Two times a day (BID) | ORAL | 2 refills | Status: AC | PRN

## 2019-02-27 NOTE — Assessment & Plan Note (Signed)
Under good control on current regimen. Continue current regimen. Continue to monitor. Call with any concerns. Refills given for 3 months. Follow up in 3 months.   

## 2019-02-27 NOTE — Telephone Encounter (Signed)
Pt. Reports she has been exposed to [place] from her daughter who lives with her. Last known exposure was this past Monday. Pt. Does not have symptoms. Recommend she get tested. States she has a virtual visit with her PCP this morning. Reviewed home safety points with pt. Wearing a mask in the home, good handwashing, wiping down high touch surfaces. Verbalizes understanding.  Answer Assessment - Initial Assessment Questions 1. COVID-19 CLOSE CONTACT: "Who is the person with the confirmed or suspected COVID-19 infection that you were exposed to?"     Daughter 2. PLACE of CONTACT: "Where were you when you were exposed to COVID-19?" (e.g., home, school, medical waiting room; which city?)     Home 3. TYPE of CONTACT: "How much contact was there?" (e.g., sitting next to, live in same house, work in same office, same building)     Live together 4. DURATION of CONTACT: "How long were you in contact with the COVID-19 patient?" (e.g., a few seconds, passed by person, a few minutes, 15 minutes or longer, live with the patient)     Hours 5. MASK: "Were you wearing a mask?" "Was the other person wearing a mask?" Note: wearing a mask reduces the risk of an  otherwise close contact.     No 6. DATE of CONTACT: "When did you have contact with a COVID-19 patient?" (e.g., how many days ago)     Monday 7. COMMUNITY SPREAD: "Are there lots of cases of COVID-19 (community spread) where you live?" (See public health department website, if unsure)       Yes 8. SYMPTOMS: "Do you have any symptoms?" (e.g., fever, cough, breathing difficulty, loss of taste or smell)     No 9. PREGNANCY OR POSTPARTUM: "Is there any chance you are pregnant?" "When was your last menstrual period?" "Did you deliver in the last 2 weeks?"     No 10. HIGH RISK: "Do you have any heart or lung problems? Do you have a weak immune system?" (e.g., heart failure, COPD, asthma, HIV positive, chemotherapy, renal failure, diabetes mellitus, sickle  cell anemia, obesity)       Cancer survivor, COPD 59.  TRAVEL: "Have you traveled out of the country recently?" If so, "When and where?"  Also ask about out-of-state travel, since the CDC has identified some high-risk cities for community spread in the [place].  Note: Travel becomes less relevant if there is widespread community transmission where the patient lives.       No  Protocols used: CORONAVIRUS (COVID-19) EXPOSURE-A-AH

## 2019-02-27 NOTE — Telephone Encounter (Signed)
Seen today. 

## 2019-02-27 NOTE — Progress Notes (Signed)
There were no vitals taken for this visit.   Subjective:    Patient ID: Maureen [name], female    DOB: 1946/03/10, 73 y.o.   MRN: [id number]  HPI: Maureen [name] is a 73 y.o. female  Chief Complaint  Patient presents with  . COVID Exposure    Patient daughter recieved a positive result today  . Anxiety   Daughter tested positive this AM. She lives with her. Maureen [name] is feeling well. Has had a bit of a cough, but it's her regular cough that she usually has.  She is otherwise feeling well. No fevers. No chills. No other concerns.   ANXIETY/STRESS Duration:stable Anxious mood: yes  Excessive worrying: yes Irritability: no  Sweating: no Nausea: no Palpitations:no Hyperventilation: no Panic attacks: no Agoraphobia: no  Obscessions/compulsions: no Depressed mood: no Depression screen [facility] 2/9 02/27/2019 11/11/2018 06/19/2018 03/07/2018 12/10/2017  Decreased Interest 0 0 1 0 0  Down, Depressed, Hopeless 0 1 1 0 0  PHQ - 2 Score 0 1 2 0 0  Altered sleeping 0 1 0 0 1  Tired, decreased energy 0 0 0 0 0  Change in appetite 0 2 0 1 0  Feeling bad or failure about yourself  0 0 0 0 0  Trouble concentrating 0 0 0 1 0  Moving slowly or fidgety/restless 0 0 0 0 0  Suicidal thoughts 0 0 0 0 0  PHQ-9 Score 0 4 2 2 1   Difficult doing work/chores Not difficult at all Somewhat difficult Not difficult at all - Not difficult at all  Some recent data might be [name]   GAD 7 : Generalized Anxiety Score 02/27/2019 11/11/2018 06/19/2018 03/07/2018  Nervous, Anxious, on Edge 1 1 1 1   Control/stop worrying 1 1 0 0  Worry too much - different things 0 0 0 0  Trouble relaxing 0 0 1 1  Restless 1 0 0 0  Easily annoyed or irritable 0 0 0 0  Afraid - awful might happen 2 0 0 0  Total GAD 7 Score 5 2 2 2   Anxiety Difficulty Not difficult at all Not difficult at all Not difficult at all Not difficult at all   Anhedonia: no Weight changes: no Insomnia: no   Hypersomnia: no Fatigue/loss of energy:  no Feelings of worthlessness: no Feelings of guilt: no Impaired concentration/indecisiveness: no Suicidal ideations: no  Crying spells: no Recent Stressors/Life Changes: no   Relationship problems: no   Family stress: no     Financial stress: no    Job stress: no    Recent death/loss: no  Relevant past medical, surgical, family and social history reviewed and updated as indicated. Interim medical history since our last visit reviewed. Allergies and medications reviewed and updated.  Review of Systems  Constitutional: Negative.   Respiratory: Positive for cough. Negative for apnea, choking, chest tightness, shortness of breath, wheezing and stridor.   Cardiovascular: Negative.   Skin: Negative.   Psychiatric/Behavioral: Negative for agitation, behavioral problems, confusion, decreased concentration, dysphoric mood, hallucinations, self-injury, sleep disturbance and suicidal ideas. The patient is nervous/anxious. The patient is not hyperactive.     Per HPI unless specifically indicated above     Objective:    There were no vitals taken for this visit.  Wt Readings from Last 3 Encounters:  11/14/18 149 lb (67.6 kg)  11/11/18 147 lb (66.7 kg)  09/12/18 147 lb (66.7 kg)    Physical Exam Vitals and nursing note reviewed.  Pulmonary:  Effort: Pulmonary effort is normal. No respiratory distress.     Comments: Speaking in full sentences Neurological:     Mental Status: She is alert.  Psychiatric:        Mood and Affect: Mood normal.        Behavior: Behavior normal.        Thought Content: Thought content normal.        Judgment: Judgment normal.     Results for orders placed or performed in visit on 11/14/18  Microscopic Examination   URINE  Result Value Ref Range   WBC, UA 0-5 0 - 5 /hpf   RBC 0-2 0 - 2 /hpf   Epithelial Cells (non renal) 0-10 0 - 10 /hpf   Casts None seen None seen /lpf   Mucus, UA Present Not Estab.   Bacteria, UA Many (A) None seen/Few   Urine Culture, Comprehensive   URINE  Result Value Ref Range   Urine Culture, Comprehensive Final report    Organism ID, Bacteria Comment   UA/M w/rflx Culture, Comp   Specimen: Urine   URINE  Result Value Ref Range   Specific Gravity, UA 1.015 1.005 - 1.030   pH, UA 5.5 5.0 - 7.5   Color, UA Yellow Yellow   Appearance Ur Clear Clear   Leukocytes,UA Negative Negative   Protein,UA Negative Negative/Trace   Glucose, UA Negative Negative   Ketones, UA Negative Negative   RBC, UA Negative Negative   Bilirubin, UA Negative Negative   Urobilinogen, Ur 0.2 0.2 - 1.0 mg/dL   Nitrite, UA Negative Negative   Microscopic Examination Comment    Microscopic Examination See below:    Urinalysis Reflex Comment   CBC with Differential/Platelet  Result Value Ref Range   WBC 6.3 3.4 - 10.8 x10E3/uL   RBC 4.22 3.77 - 5.28 x10E6/uL   Hemoglobin 13.5 11.1 - 15.9 g/dL   Hematocrit 40.5 34.0 - 46.6 %   MCV 96 79 - 97 fL   MCH 32.0 26.6 - 33.0 pg   MCHC 33.3 31.5 - 35.7 g/dL   RDW 13.3 11.7 - 15.4 %   Platelets 180 150 - 450 x10E3/uL   Neutrophils 58 Not Estab. %   Lymphs 32 Not Estab. %   Monocytes 7 Not Estab. %   Eos 2 Not Estab. %   Basos 1 Not Estab. %   Neutrophils Absolute 3.7 1.4 - 7.0 x10E3/uL   Lymphocytes Absolute 2.0 0.7 - 3.1 x10E3/uL   Monocytes Absolute 0.4 0.1 - 0.9 x10E3/uL   EOS (ABSOLUTE) 0.1 0.0 - 0.4 x10E3/uL   Basophils Absolute 0.0 0.0 - 0.2 x10E3/uL   Immature Granulocytes 0 Not Estab. %   Immature Grans (Abs) 0.0 0.0 - 0.1 x10E3/uL  Comprehensive metabolic panel  Result Value Ref Range   Glucose 88 65 - 99 mg/dL   BUN 17 8 - 27 mg/dL   Creatinine, Ser 1.01 (H) 0.57 - 1.00 mg/dL   GFR calc non Af Amer 56 (L) >59 mL/min/1.73   GFR calc Af Amer 64 >59 mL/min/1.73   BUN/Creatinine Ratio 17 12 - 28   Sodium 141 134 - 144 mmol/L   Potassium 4.4 3.5 - 5.2 mmol/L   Chloride 103 96 - 106 mmol/L   CO2 22 20 - 29 mmol/L   Calcium 9.8 8.7 - 10.3 mg/dL   Total Protein  6.2 6.0 - 8.5 g/dL   Albumin 4.4 3.7 - 4.7 g/dL   Globulin, Total 1.8 1.5 - 4.5 g/dL  Albumin/Globulin Ratio 2.4 (H) 1.2 - 2.2   Bilirubin Total 0.2 0.0 - 1.2 mg/dL   Alkaline Phosphatase 81 39 - 117 IU/L   AST 15 0 - 40 IU/L   ALT 15 0 - 32 IU/L  Lipid Panel With LDL/HDL Ratio  Result Value Ref Range   Cholesterol, Total 234 (H) 100 - 199 mg/dL   Triglycerides 137 0 - 149 mg/dL   HDL 48 >39 mg/dL   VLDL Cholesterol Cal 27 5 - 40 mg/dL   LDL Calculated 159 (H) 0 - 99 mg/dL   LDl/HDL Ratio 3.3 (H) 0.0 - 3.2 ratio  TSH  Result Value Ref Range   TSH 1.360 0.450 - 4.500 uIU/mL  VITAMIN D 25 Hydroxy (Vit-D Deficiency, Fractures)  Result Value Ref Range   Vit D, 25-Hydroxy 36.2 30.0 - 100.0 ng/mL      Assessment & Plan:   Problem List Items Addressed This Visit      Other   Generalized anxiety disorder    Under good control on current regimen. Continue current regimen. Continue to monitor. Call with any concerns. Refills given for 3 months. Follow up in 3 months.        Relevant Medications   ALPRAZolam (XANAX) 0.5 MG tablet    Other Visit Diagnoses    Exposure to COVID-19 virus    -  Primary   Will go to get tested. Self-quarantine for next 24 days (14 days after her daughter's 10 days) if negative. Call with any concerns.    Relevant Orders   Novel Coronavirus, NAA ([facility])       Follow up plan: Return in about 3 months (around 05/28/2019).    . This visit was completed via telephone due to the restrictions of the COVID-19 pandemic. All issues as above were discussed and addressed but no physical exam was performed. If it was felt that the patient should be evaluated in the office, they were directed there. The patient verbally consented to this visit. Patient was unable to complete an audio/visual visit due to Lack of equipment. Due to the catastrophic nature of the COVID-19 pandemic, this visit was done through audio contact only. . Location of the patient: home .  Location of the provider: work . Those involved with this call:  . Provider: Park Liter, DO . CMA: Tiffany Reel, CMA . Front Desk/Registration: Don Perking  . Time spent on call: 25 minutes with patient face to face via video conference. More than 50% of this time was spent in counseling and coordination of care. 40 minutes total spent in review of patient's record and preparation of their chart.

## 2019-03-03 ENCOUNTER — Telehealth: Payer: Self-pay

## 2019-03-03 NOTE — Telephone Encounter (Signed)
Noted. Thanks.

## 2019-03-03 NOTE — Telephone Encounter (Signed)
My apologies, the CRM didn't attach.  Reason for CRM: pt is calling and would like dr Wynetta Emery know she is  covid negative and would like  md to know she is going to be quarantine from her daughter who lives in the same house for 14 days

## 2019-03-03 NOTE — Telephone Encounter (Signed)
Routing to provider  

## 2019-03-03 NOTE — Telephone Encounter (Signed)
Nothing attached 

## 2019-03-05 ENCOUNTER — Encounter: Payer: Self-pay | Admitting: Family Medicine

## 2019-03-05 NOTE — Progress Notes (Signed)
Unable to LVM # has been disconnected. Sent letter.

## 2019-04-28 ENCOUNTER — Telehealth (INDEPENDENT_AMBULATORY_CARE_PROVIDER_SITE_OTHER): Payer: Managed Care, Other (non HMO) | Admitting: Family Medicine

## 2019-04-28 ENCOUNTER — Telehealth: Payer: Self-pay | Admitting: Family Medicine

## 2019-04-28 ENCOUNTER — Encounter: Payer: Self-pay | Admitting: Family Medicine

## 2019-04-28 VITALS — BP 120/74 | HR 85 | Temp 98.5°F

## 2019-04-28 DIAGNOSIS — L299 Pruritus, unspecified: Secondary | ICD-10-CM | POA: Diagnosis not present

## 2019-04-28 MED ORDER — PREDNISONE 50 MG PO TABS: 50.0000 mg | ORAL_TABLET | Freq: Every day | ORAL | 0 refills | Status: DC

## 2019-04-28 NOTE — Progress Notes (Signed)
BP 120/74   Pulse 85   Temp 98.5 F (36.9 C) (Oral)   SpO2 98%    Subjective:    Patient ID: Maureen [name], female    DOB: Nov 02, 1945, 74 y.o.   MRN: [id number]  HPI: Maureen [name] is a 74 y.o. female presenting on 04/28/2019 for Covid Vaccine  (pt states she had a bad reaction to her first COVID vaccine. States she was itching all over. )  [name] presents concerned about taking her 2nd moderna COVID vaccine. She notes that she got her last shot about a month ago. She got her shot in the AM. At 9PM she started with itching all over. She took benadryl and xanax and went to sleep. She felt a little sick on her stomach. Lasted about 2 days. She didn't have hives, but had bumps under her skin. No SOB, no swelling. It then totally resolved. No issues since. She is scheduled to get her 2nd dose tomorrow and is concerned about getting it. She is otherwise feeling well with no other concerns or complaints at this time.   Relevant past medical, surgical, family and social history reviewed and updated as indicated. Interim medical history since our last visit reviewed. Allergies and medications reviewed and updated.  Current Outpatient Medications on File Prior to Visit  Medication Sig  . acetaminophen (TYLENOL) 500 MG tablet Take 500 mg by mouth every 6 (six) hours as needed for headache.  . albuterol (PROVENTIL HFA;VENTOLIN HFA) 108 (90 Base) MCG/ACT inhaler TAKE 2 PUFFS BY MOUTH EVERY 6 HOURS AS NEEDED FOR WHEEZE OR SHORTNESS OF BREATH  . ALPRAZolam (XANAX) 0.5 MG tablet Take 1 tablet (0.5 mg total) by mouth 2 (two) times daily as needed for anxiety.  . Ascorbic Acid (VITAMIN C) 100 MG tablet Take 100 mg by mouth daily.  . budesonide-formoterol (SYMBICORT) 160-4.5 MCG/ACT inhaler Inhale 2 puffs into the lungs 2 (two) times daily.  . Calcium-Vitamins C & D (CALCIUM/C/D PO) Take by mouth daily.  . Cholecalciferol (VITAMIN D3) 50 MCG (2000 UT) TABS Take by mouth.  . clobetasol cream (TEMOVATE)  2.97 % Apply 1 application topically daily.  . Homeopathic Products (ZICAM ALLERGY RELIEF NA) Place 2 sprays into the nose daily as needed (allergies).   Marland Kitchen KRILL OIL PO Take by mouth daily.  . Multiple Vitamin (MULTIVITAMIN PO) Take by mouth daily.  Marland Kitchen omeprazole (PRILOSEC) 20 MG capsule Take 20 mg by mouth daily.  Marland Kitchen RA KRILL OIL 500 MG CAPS Take 500 mg by mouth daily.  Marland Kitchen tiotropium (SPIRIVA HANDIHALER) 18 MCG inhalation capsule Place 1 capsule (18 mcg total) into inhaler and inhale daily.  . vitamin E 100 UNIT capsule Take by mouth daily.  Marland Kitchen zinc gluconate 50 MG tablet Take 50 mg by mouth every other day.   No current facility-administered medications on file prior to visit.    Review of Systems  Constitutional: Negative.   Respiratory: Negative.   Cardiovascular: Negative.   Musculoskeletal: Negative.   Neurological: Negative.   Psychiatric/Behavioral: Negative.     Per HPI unless specifically indicated above     Objective:    BP 120/74   Pulse 85   Temp 98.5 F (36.9 C) (Oral)   SpO2 98%   Wt Readings from Last 3 Encounters:  11/14/18 149 lb (67.6 kg)  11/11/18 147 lb (66.7 kg)  09/12/18 147 lb (66.7 kg)    Physical Exam Vitals and nursing note reviewed.  Constitutional:      General:  She is not in acute distress.    Appearance: Normal appearance. She is not ill-appearing, toxic-appearing or diaphoretic.  HENT:     Head: Normocephalic and atraumatic.     Right Ear: External ear normal.     Left Ear: External ear normal.     Nose: Nose normal.     Mouth/Throat:     Mouth: Mucous membranes are moist.     Pharynx: Oropharynx is clear.  Eyes:     General: No scleral icterus.       Right eye: No discharge.        Left eye: No discharge.     Conjunctiva/sclera: Conjunctivae normal.     Pupils: Pupils are equal, round, and reactive to light.  Pulmonary:     Effort: Pulmonary effort is normal. No respiratory distress.     Comments: Speaking in full  sentences Musculoskeletal:        General: Normal range of motion.     Cervical back: Normal range of motion.  Skin:    Coloration: Skin is not jaundiced or pale.     Findings: No bruising, erythema, lesion or rash.  Neurological:     Mental Status: She is alert and oriented to person, place, and time. Mental status is at baseline.  Psychiatric:        Mood and Affect: Mood normal.        Behavior: Behavior normal.        Thought Content: Thought content normal.        Judgment: Judgment normal.     Results for orders placed or performed in visit on 11/14/18  Microscopic Examination   URINE  Result Value Ref Range   WBC, UA 0-5 0 - 5 /hpf   RBC 0-2 0 - 2 /hpf   Epithelial Cells (non renal) 0-10 0 - 10 /hpf   Casts None seen None seen /lpf   Mucus, UA Present Not Estab.   Bacteria, UA Many (A) None seen/Few  Urine Culture, Comprehensive   URINE  Result Value Ref Range   Urine Culture, Comprehensive Final report    Organism ID, Bacteria Comment   UA/M w/rflx Culture, Comp   Specimen: Urine   URINE  Result Value Ref Range   Specific Gravity, UA 1.015 1.005 - 1.030   pH, UA 5.5 5.0 - 7.5   Color, UA Yellow Yellow   Appearance Ur Clear Clear   Leukocytes,UA Negative Negative   Protein,UA Negative Negative/Trace   Glucose, UA Negative Negative   Ketones, UA Negative Negative   RBC, UA Negative Negative   Bilirubin, UA Negative Negative   Urobilinogen, Ur 0.2 0.2 - 1.0 mg/dL   Nitrite, UA Negative Negative   Microscopic Examination Comment    Microscopic Examination See below:    Urinalysis Reflex Comment   CBC with Differential/Platelet  Result Value Ref Range   WBC 6.3 3.4 - 10.8 x10E3/uL   RBC 4.22 3.77 - 5.28 x10E6/uL   Hemoglobin 13.5 11.1 - 15.9 g/dL   Hematocrit 40.5 34.0 - 46.6 %   MCV 96 79 - 97 fL   MCH 32.0 26.6 - 33.0 pg   MCHC 33.3 31.5 - 35.7 g/dL   RDW 13.3 11.7 - 15.4 %   Platelets 180 150 - 450 x10E3/uL   Neutrophils 58 Not Estab. %   Lymphs 32  Not Estab. %   Monocytes 7 Not Estab. %   Eos 2 Not Estab. %   Basos 1 Not Estab. %  Neutrophils Absolute 3.7 1.4 - 7.0 x10E3/uL   Lymphocytes Absolute 2.0 0.7 - 3.1 x10E3/uL   Monocytes Absolute 0.4 0.1 - 0.9 x10E3/uL   EOS (ABSOLUTE) 0.1 0.0 - 0.4 x10E3/uL   Basophils Absolute 0.0 0.0 - 0.2 x10E3/uL   Immature Granulocytes 0 Not Estab. %   Immature Grans (Abs) 0.0 0.0 - 0.1 x10E3/uL  Comprehensive metabolic panel  Result Value Ref Range   Glucose 88 65 - 99 mg/dL   BUN 17 8 - 27 mg/dL   Creatinine, Ser 1.01 (H) 0.57 - 1.00 mg/dL   GFR calc non Af Amer 56 (L) >59 mL/min/1.73   GFR calc Af Amer 64 >59 mL/min/1.73   BUN/Creatinine Ratio 17 12 - 28   Sodium 141 134 - 144 mmol/L   Potassium 4.4 3.5 - 5.2 mmol/L   Chloride 103 96 - 106 mmol/L   CO2 22 20 - 29 mmol/L   Calcium 9.8 8.7 - 10.3 mg/dL   Total Protein 6.2 6.0 - 8.5 g/dL   Albumin 4.4 3.7 - 4.7 g/dL   Globulin, Total 1.8 1.5 - 4.5 g/dL   Albumin/Globulin Ratio 2.4 (H) 1.2 - 2.2   Bilirubin Total 0.2 0.0 - 1.2 mg/dL   Alkaline Phosphatase 81 39 - 117 IU/L   AST 15 0 - 40 IU/L   ALT 15 0 - 32 IU/L  Lipid Panel With LDL/HDL Ratio  Result Value Ref Range   Cholesterol, Total 234 (H) 100 - 199 mg/dL   Triglycerides 137 0 - 149 mg/dL   HDL 48 >39 mg/dL   VLDL Cholesterol Cal 27 5 - 40 mg/dL   LDL Calculated 159 (H) 0 - 99 mg/dL   LDl/HDL Ratio 3.3 (H) 0.0 - 3.2 ratio  TSH  Result Value Ref Range   TSH 1.360 0.450 - 4.500 uIU/mL  VITAMIN D 25 Hydroxy (Vit-D Deficiency, Fractures)  Result Value Ref Range   Vit D, 25-Hydroxy 36.2 30.0 - 100.0 ng/mL      Assessment & Plan:   Problem List Items Addressed This Visit    None    Visit Diagnoses    Itching    -  Primary   Resolved now. Will premedicate with zyrtec and take benadryl after 2nd shot. Rx for prednisone sent in case gets itchy again. Monitor closely.       Follow up plan: Return if symptoms worsen or fail to improve.   . This visit was completed via  mychart due to the restrictions of the COVID-19 pandemic. All issues as above were discussed and addressed. Physical exam was done as above through visual confirmation on mychart. If it was felt that the patient should be evaluated in the office, they were directed there. The patient verbally consented to this visit. . Location of the patient: work . Location of the provider: work . Those involved with this call:  . Provider: Park Liter, DO . CMA: Tiffany Reel, CMA . Front Desk/Registration: Don Perking  . Time spent on call: 15 minutes with patient face to face via video conference. More than 50% of this time was spent in counseling and coordination of care. 23 minutes total spent in review of patient's record and preparation of their chart.

## 2019-04-28 NOTE — Telephone Encounter (Signed)
Appt scheduled

## 2019-04-28 NOTE — Telephone Encounter (Signed)
Pt is here stating that she had a reaction to the first covid vaccine and has her second one scheduled for tomorrow but needs to ask and notify her provider before the second dose, pt is unsure whats she needs to do or if she can have the second dose? Please advise.

## 2019-04-28 NOTE — Telephone Encounter (Signed)
I don't know what kind of reaction. Please schedule appointment

## 2019-04-29 ENCOUNTER — Encounter: Payer: Self-pay | Admitting: Family Medicine

## 2019-05-05 ENCOUNTER — Telehealth: Payer: Self-pay

## 2019-05-05 NOTE — Telephone Encounter (Signed)
Prior Authorization initiated via CoverMyMeds for Budesonide-Formoterol Fumarate 160-4.5MCG/ACT aerosol Key: [id number]

## 2019-05-08 NOTE — Telephone Encounter (Signed)
PA denied. We should receive a fax as to why.

## 2019-06-25 ENCOUNTER — Other Ambulatory Visit: Payer: Self-pay | Admitting: Family Medicine

## 2019-06-25 NOTE — Telephone Encounter (Signed)
Needs appointment

## 2019-06-25 NOTE — Telephone Encounter (Signed)
Unable to lvm to make apt.

## 2019-06-25 NOTE — Telephone Encounter (Signed)
Routing to provider  

## 2019-06-25 NOTE — Telephone Encounter (Signed)
Called patient, no answer, unable to leave a message, will try again.   

## 2019-06-25 NOTE — Telephone Encounter (Signed)
Please get patient scheduled.  °

## 2019-06-26 NOTE — Telephone Encounter (Signed)
Unable to lvm to make apt

## 2019-06-27 NOTE — Telephone Encounter (Signed)
Pt has apt on 07/02/19.

## 2019-07-02 ENCOUNTER — Telehealth (INDEPENDENT_AMBULATORY_CARE_PROVIDER_SITE_OTHER): Payer: Managed Care, Other (non HMO) | Admitting: Family Medicine

## 2019-07-02 ENCOUNTER — Encounter: Payer: Self-pay | Admitting: Family Medicine

## 2019-07-02 DIAGNOSIS — M329 Systemic lupus erythematosus, unspecified: Secondary | ICD-10-CM

## 2019-07-02 DIAGNOSIS — I714 Abdominal aortic aneurysm, without rupture, unspecified: Secondary | ICD-10-CM

## 2019-07-02 DIAGNOSIS — F411 Generalized anxiety disorder: Secondary | ICD-10-CM

## 2019-07-02 DIAGNOSIS — E559 Vitamin D deficiency, unspecified: Secondary | ICD-10-CM

## 2019-07-02 DIAGNOSIS — IMO0002 Reserved for concepts with insufficient information to code with codable children: Secondary | ICD-10-CM

## 2019-07-02 DIAGNOSIS — K219 Gastro-esophageal reflux disease without esophagitis: Secondary | ICD-10-CM

## 2019-07-02 DIAGNOSIS — J4489 Other specified chronic obstructive pulmonary disease: Secondary | ICD-10-CM

## 2019-07-02 DIAGNOSIS — J449 Chronic obstructive pulmonary disease, unspecified: Secondary | ICD-10-CM

## 2019-07-02 DIAGNOSIS — E782 Mixed hyperlipidemia: Secondary | ICD-10-CM

## 2019-07-02 MED ORDER — BUDESONIDE-FORMOTEROL FUMARATE 160-4.5 MCG/ACT IN AERO: 2.0000 | INHALATION_SPRAY | Freq: Two times a day (BID) | RESPIRATORY_TRACT | 6 refills | Status: DC

## 2019-07-02 MED ORDER — ALPRAZOLAM 0.5 MG PO TABS: 0.5000 mg | ORAL_TABLET | Freq: Two times a day (BID) | ORAL | 2 refills | Status: DC | PRN

## 2019-07-02 MED ORDER — ALBUTEROL SULFATE HFA 108 (90 BASE) MCG/ACT IN AERS: INHALATION_SPRAY | RESPIRATORY_TRACT | 6 refills | Status: DC

## 2019-07-02 NOTE — Assessment & Plan Note (Signed)
Due for recheck on her labs. Will get her in ASAP. Treat as needed. Patient is taking OTC Krill oil and refuses statin.

## 2019-07-02 NOTE — Progress Notes (Signed)
There were no vitals taken for this visit.   Subjective:    Patient ID: Maureen [name], female    DOB: 1945-07-18, 74 y.o.   MRN: [id number]  HPI: Maureen [name] is a 74 y.o. female  Chief Complaint  Patient presents with  . Anxiety  . Hyperlipidemia  . COPD   HYPERLIPIDEMIA Hyperlipidemia status: stable Satisfied with current treatment?  yes Past cholesterol meds: none Supplements: fish oil Aspirin:  no The 10-year ASCVD risk score Mikey Bussing DC Jr., et al., 2013) is: 17.8%   Values used to calculate the score:     Age: 62 years     Sex: Female     Is Non-Hispanic African American: No     Diabetic: No     Tobacco smoker: Yes     Systolic Blood Pressure: 025 mmHg     Is BP treated: No     HDL Cholesterol: 48 mg/dL     Total Cholesterol: 234 mg/dL Chest pain:  no  COPD COPD status: stable Satisfied with current treatment?: yes Oxygen use: no Dyspnea frequency: very rarely Cough frequency: very rarely Rescue inhaler frequency:  Very rarely Limitation of activity: no Productive cough: no Pneumovax: Up to Date Influenza: Up to Date  ANXIETY/STRESS Duration:stable Anxious mood: yes  Excessive worrying: yes Irritability: yes  Sweating: no Nausea: no Palpitations:no Hyperventilation: no Panic attacks: yes Agoraphobia: no  Obscessions/compulsions: no Depressed mood: yes Depression screen [facility] 2/9 07/02/2019 02/27/2019 11/11/2018 06/19/2018 03/07/2018  Decreased Interest 0 0 0 1 0  Down, Depressed, Hopeless 1 0 1 1 0  PHQ - 2 Score 1 0 1 2 0  Altered sleeping 0 0 1 0 0  Tired, decreased energy 0 0 0 0 0  Change in appetite 1 0 2 0 1  Feeling bad or failure about yourself  0 0 0 0 0  Trouble concentrating 0 0 0 0 1  Moving slowly or fidgety/restless 1 0 0 0 0  Suicidal thoughts 0 0 0 0 0  PHQ-9 Score 3 0 4 2 2   Difficult doing work/chores - Not difficult at all Somewhat difficult Not difficult at all -  Some recent data might be hidden   GAD 7 : Generalized  Anxiety Score 07/02/2019 02/27/2019 11/11/2018 06/19/2018  Nervous, Anxious, on Edge 1 1 1 1   Control/stop worrying 0 1 1 0  Worry too much - different things 0 0 0 0  Trouble relaxing 0 0 0 1  Restless 1 1 0 0  Easily annoyed or irritable 0 0 0 0  Afraid - awful might happen 0 2 0 0  Total GAD 7 Score 2 5 2 2   Anxiety Difficulty - Not difficult at all Not difficult at all Not difficult at all   Anhedonia: no Weight changes: no Insomnia: no   Hypersomnia: yes Fatigue/loss of energy: yes Feelings of worthlessness: no Feelings of guilt: no Impaired concentration/indecisiveness: no Suicidal ideations: no  Crying spells: no Recent Stressors/Life Changes: yes   Relationship problems: no   Family stress: no     Financial stress: no    Job stress: no    Recent death/loss: no  Relevant past medical, surgical, family and social history reviewed and updated as indicated. Interim medical history since our last visit reviewed. Allergies and medications reviewed and updated.  Review of Systems  Constitutional: Negative.   Respiratory: Negative.   Cardiovascular: Negative.   Gastrointestinal: Negative.   Musculoskeletal: Negative.   Skin: Negative.  Neurological: Negative.   Psychiatric/Behavioral: Negative for agitation, behavioral problems, confusion, decreased concentration, dysphoric mood, hallucinations, self-injury, sleep disturbance and suicidal ideas. The patient is nervous/anxious. The patient is not hyperactive.     Per HPI unless specifically indicated above     Objective:    There were no vitals taken for this visit.  Wt Readings from Last 3 Encounters:  11/14/18 149 lb (67.6 kg)  11/11/18 147 lb (66.7 kg)  09/12/18 147 lb (66.7 kg)    Physical Exam Vitals and nursing note reviewed.  Constitutional:      General: She is not in acute distress.    Appearance: Normal appearance. She is not ill-appearing, toxic-appearing or diaphoretic.  HENT:     Head: Normocephalic  and atraumatic.     Right Ear: External ear normal.     Left Ear: External ear normal.     Nose: Nose normal.     Mouth/Throat:     Mouth: Mucous membranes are moist.     Pharynx: Oropharynx is clear.  Eyes:     General: No scleral icterus.       Right eye: No discharge.        Left eye: No discharge.     Conjunctiva/sclera: Conjunctivae normal.     Pupils: Pupils are equal, round, and reactive to light.  Pulmonary:     Effort: Pulmonary effort is normal. No respiratory distress.     Comments: Speaking in full sentences Musculoskeletal:        General: Normal range of motion.     Cervical back: Normal range of motion.  Skin:    Coloration: Skin is not jaundiced or pale.     Findings: No bruising, erythema, lesion or rash.  Neurological:     Mental Status: She is alert and oriented to person, place, and time. Mental status is at baseline.  Psychiatric:        Mood and Affect: Mood normal.        Behavior: Behavior normal.        Thought Content: Thought content normal.        Judgment: Judgment normal.     Results for orders placed or performed in visit on 11/14/18  Microscopic Examination   URINE  Result Value Ref Range   WBC, UA 0-5 0 - 5 /hpf   RBC 0-2 0 - 2 /hpf   Epithelial Cells (non renal) 0-10 0 - 10 /hpf   Casts None seen None seen /lpf   Mucus, UA Present Not Estab.   Bacteria, UA Many (A) None seen/Few  Urine Culture, Comprehensive   URINE  Result Value Ref Range   Urine Culture, Comprehensive Final report    Organism ID, Bacteria Comment   UA/M w/rflx Culture, Comp   Specimen: Urine   URINE  Result Value Ref Range   Specific Gravity, UA 1.015 1.005 - 1.030   pH, UA 5.5 5.0 - 7.5   Color, UA Yellow Yellow   Appearance Ur Clear Clear   Leukocytes,UA Negative Negative   Protein,UA Negative Negative/Trace   Glucose, UA Negative Negative   Ketones, UA Negative Negative   RBC, UA Negative Negative   Bilirubin, UA Negative Negative   Urobilinogen, Ur  0.2 0.2 - 1.0 mg/dL   Nitrite, UA Negative Negative   Microscopic Examination Comment    Microscopic Examination See below:    Urinalysis Reflex Comment   CBC with Differential/Platelet  Result Value Ref Range   WBC 6.3 3.4 - 10.8  x10E3/uL   RBC 4.22 3.77 - 5.28 x10E6/uL   Hemoglobin 13.5 11.1 - 15.9 g/dL   Hematocrit 40.5 34.0 - 46.6 %   MCV 96 79 - 97 fL   MCH 32.0 26.6 - 33.0 pg   MCHC 33.3 31.5 - 35.7 g/dL   RDW 13.3 11.7 - 15.4 %   Platelets 180 150 - 450 x10E3/uL   Neutrophils 58 Not Estab. %   Lymphs 32 Not Estab. %   Monocytes 7 Not Estab. %   Eos 2 Not Estab. %   Basos 1 Not Estab. %   Neutrophils Absolute 3.7 1.4 - 7.0 x10E3/uL   Lymphocytes Absolute 2.0 0.7 - 3.1 x10E3/uL   Monocytes Absolute 0.4 0.1 - 0.9 x10E3/uL   EOS (ABSOLUTE) 0.1 0.0 - 0.4 x10E3/uL   Basophils Absolute 0.0 0.0 - 0.2 x10E3/uL   Immature Granulocytes 0 Not Estab. %   Immature Grans (Abs) 0.0 0.0 - 0.1 x10E3/uL  Comprehensive metabolic panel  Result Value Ref Range   Glucose 88 65 - 99 mg/dL   BUN 17 8 - 27 mg/dL   Creatinine, Ser 1.01 (H) 0.57 - 1.00 mg/dL   GFR calc non Af Amer 56 (L) >59 mL/min/1.73   GFR calc Af Amer 64 >59 mL/min/1.73   BUN/Creatinine Ratio 17 12 - 28   Sodium 141 134 - 144 mmol/L   Potassium 4.4 3.5 - 5.2 mmol/L   Chloride 103 96 - 106 mmol/L   CO2 22 20 - 29 mmol/L   Calcium 9.8 8.7 - 10.3 mg/dL   Total Protein 6.2 6.0 - 8.5 g/dL   Albumin 4.4 3.7 - 4.7 g/dL   Globulin, Total 1.8 1.5 - 4.5 g/dL   Albumin/Globulin Ratio 2.4 (H) 1.2 - 2.2   Bilirubin Total 0.2 0.0 - 1.2 mg/dL   Alkaline Phosphatase 81 39 - 117 IU/L   AST 15 0 - 40 IU/L   ALT 15 0 - 32 IU/L  Lipid Panel With LDL/HDL Ratio  Result Value Ref Range   Cholesterol, Total 234 (H) 100 - 199 mg/dL   Triglycerides 137 0 - 149 mg/dL   HDL 48 >39 mg/dL   VLDL Cholesterol Cal 27 5 - 40 mg/dL   LDL Calculated 159 (H) 0 - 99 mg/dL   LDl/HDL Ratio 3.3 (H) 0.0 - 3.2 ratio  TSH  Result Value Ref Range   TSH  1.360 0.450 - 4.500 uIU/mL  VITAMIN D 25 Hydroxy (Vit-D Deficiency, Fractures)  Result Value Ref Range   Vit D, 25-Hydroxy 36.2 30.0 - 100.0 ng/mL      Assessment & Plan:   Problem List Items Addressed This Visit      Cardiovascular and Mediastinum   Abdominal aortic aneurysm (AAA) without rupture ([place])    Due for follow up US in 7/22. Continue to monitor. Will keep BP and cholesterol under good control.         Respiratory   COPD (chronic obstructive pulmonary disease) with chronic bronchitis (HCC)    Under good control on current regimen. Continue current regimen. Continue to monitor. Call with any concerns. Refills given. Labs to be drawn ASAP.          Digestive   Esophageal reflux    Under good control on current regimen. Continue current regimen. Continue to monitor. Call with any concerns. Refills given.          Other   Lupus ([place])    Stable. Continue to monitor. Call with any concerns.  Generalized anxiety disorder - Primary    Under good control on current regimen. Continue current regimen. Continue to monitor. Call with any concerns. Refills given for 3 months, follow up 3 months.        Relevant Medications   ALPRAZolam (XANAX) 0.5 MG tablet   Hyperlipidemia    Due for recheck on her labs. Will get her in ASAP. Treat as needed. Patient is taking OTC Krill oil and refuses statin.       Vitamin D deficiency    Due for recheck on labs. Will check ASAP.          Follow up plan: Return in about 3 months (around 09/30/2019).   . This visit was completed via MyChart due to the restrictions of the COVID-19 pandemic. All issues as above were discussed and addressed. Physical exam was done as above through visual confirmation on MyChart. If it was felt that the patient should be evaluated in the office, they were directed there. The patient verbally consented to this visit. . Location of the patient: home . Location of the provider: home . Those  involved with this call:  . Provider: Park Liter, DO . CMA: Lauretta Grill, RMA . Front Desk/Registration: Don Perking  . Time spent on call: 25 minutes with patient face to face via video conference. More than 50% of this time was spent in counseling and coordination of care. 40 minutes total spent in review of patient's record and preparation of their chart.

## 2019-07-02 NOTE — Assessment & Plan Note (Signed)
Due for follow up US in 7/22. Continue to monitor. Will keep BP and cholesterol under good control.

## 2019-07-02 NOTE — Assessment & Plan Note (Signed)
Under good control on current regimen. Continue current regimen. Continue to monitor. Call with any concerns. Refills given.   

## 2019-07-02 NOTE — Assessment & Plan Note (Signed)
Under good control on current regimen. Continue current regimen. Continue to monitor. Call with any concerns. Refills given. Labs to be drawn ASAP.

## 2019-07-02 NOTE — Assessment & Plan Note (Signed)
Stable. Continue to monitor. Call with any concerns.  ?

## 2019-07-02 NOTE — Assessment & Plan Note (Signed)
Due for recheck on labs. Will check ASAP.

## 2019-07-02 NOTE — Assessment & Plan Note (Signed)
Under good control on current regimen. Continue current regimen. Continue to monitor. Call with any concerns. Refills given for 3 months, follow up 3 months.

## 2019-07-04 ENCOUNTER — Telehealth: Payer: Self-pay | Admitting: Family Medicine

## 2019-07-04 NOTE — Telephone Encounter (Signed)
Routing to provider as Juluis Rainier.  Appointment was on 07/02/2019

## 2019-07-04 NOTE — Telephone Encounter (Signed)
Pt is here stating she had a virtual and is leaving her vital signs  Temp 98.6 B/p 128/86 P: 84 R:16

## 2019-07-10 ENCOUNTER — Other Ambulatory Visit: Payer: Self-pay

## 2019-07-10 ENCOUNTER — Encounter: Payer: Self-pay | Admitting: Medical

## 2019-07-10 ENCOUNTER — Other Ambulatory Visit: Payer: Managed Care, Other (non HMO) | Admitting: Medical

## 2019-07-10 VITALS — BP 132/76 | HR 76 | Temp 98.0°F | Resp 16 | Ht 62.0 in | Wt 149.0 lb

## 2019-07-10 DIAGNOSIS — Z008 Encounter for other general examination: Secondary | ICD-10-CM | POA: Diagnosis not present

## 2019-07-10 DIAGNOSIS — F411 Generalized anxiety disorder: Secondary | ICD-10-CM | POA: Diagnosis not present

## 2019-07-10 DIAGNOSIS — E782 Mixed hyperlipidemia: Secondary | ICD-10-CM

## 2019-07-10 DIAGNOSIS — J449 Chronic obstructive pulmonary disease, unspecified: Secondary | ICD-10-CM

## 2019-07-10 DIAGNOSIS — E559 Vitamin D deficiency, unspecified: Secondary | ICD-10-CM

## 2019-07-10 DIAGNOSIS — J4489 Other specified chronic obstructive pulmonary disease: Secondary | ICD-10-CM

## 2019-07-10 NOTE — Addendum Note (Signed)
Addended by: Jay Schlichter D on: 07/10/2019 09:42 AM   Modules accepted: Orders

## 2019-07-10 NOTE — Addendum Note (Signed)
Addended by: Jay Schlichter D on: 07/10/2019 09:38 AM   Modules accepted: Orders

## 2019-07-10 NOTE — Progress Notes (Addendum)
Subjective:    Patient ID: Maureen [name], female    DOB: [birth year], 74 y.o.   MRN: [id number]   This is not an addendum this is the actual Biometric Screening Chart. Lab chart was signed and I could not document without making it an Addendum. HPI 74 yo female in non acute distress, here for Biometric Screening. No complaints today.  Blood pressure 132/76, pulse 76, temperature 98 F (36.7 C), temperature source Temporal, resp. rate 16, height 5\' 2"  (1.575 m), weight 149 lb (67.6 kg), SpO2 98 %.  Review of Systems  HENT: Positive for rhinorrhea (from allergies , she is taking no allergy medicationl).   Respiratory: Negative for apnea, cough, chest tightness and shortness of breath.   Cardiovascular: Negative for chest pain, palpitations and leg swelling.  Allergic/Immunologic: Positive for environmental allergies.  All other systems reviewed and are negative.  Allergies  Allergen Reactions  . Codeine Itching  . Sulfa Antibiotics Hives  . Sulfinpyrazone   . Erythromycin Rash  . Penicillins Rash    Current Outpatient Medications:  .  acetaminophen (TYLENOL) 500 MG tablet, Take 500 mg by mouth every 6 (six) hours as needed for headache., Disp: , Rfl:  .  albuterol (VENTOLIN HFA) 108 (90 Base) MCG/ACT inhaler, TAKE 2 PUFFS BY MOUTH EVERY 6 HOURS AS NEEDED FOR WHEEZE OR SHORTNESS OF BREATH, Disp: 18 g, Rfl: 6 .  ALPRAZolam (XANAX) 0.5 MG tablet, Take 1 tablet (0.5 mg total) by mouth 2 (two) times daily as needed for anxiety., Disp: 45 tablet, Rfl: 2 .  Ascorbic Acid (VITAMIN C) 100 MG tablet, Take 100 mg by mouth daily., Disp: , Rfl:  .  budesonide-formoterol (SYMBICORT) 160-4.5 MCG/ACT inhaler, Inhale 2 puffs into the lungs 2 (two) times daily., Disp: 1 Inhaler, Rfl: 6 .  Calcium-Vitamins C & D (CALCIUM/C/D PO), Take by mouth daily., Disp: , Rfl:  .  Cholecalciferol (VITAMIN D3) 50 MCG (2000 UT) TABS, Take by mouth., Disp: , Rfl:  .  clobetasol cream (TEMOVATE) 7.98 %, Apply 1  application topically daily., Disp: , Rfl:  .  Homeopathic Products (ZICAM ALLERGY RELIEF NA), Place 2 sprays into the nose daily as needed (allergies). , Disp: , Rfl:  .  KRILL OIL PO, Take by mouth daily., Disp: , Rfl:  .  Multiple Vitamin (MULTIVITAMIN PO), Take by mouth daily., Disp: , Rfl:  .  omeprazole (PRILOSEC) 20 MG capsule, Take 20 mg by mouth daily., Disp: , Rfl:  .  RA KRILL OIL 500 MG CAPS, Take 500 mg by mouth daily., Disp: , Rfl:  .  tiotropium (SPIRIVA HANDIHALER) 18 MCG inhalation capsule, Place 1 capsule (18 mcg total) into inhaler and inhale daily. (Patient not taking: Reported on 07/02/2019), Disp: 30 capsule, Rfl: 12 .  vitamin E 100 UNIT capsule, Take by mouth daily., Disp: , Rfl:  .  zinc gluconate 50 MG tablet, Take 50 mg by mouth every other day., Disp: , Rfl:     Past Medical History:  Diagnosis Date  . Arthritis 2005  . 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 This was a histologic grade 2 tumor measuring just under 1 cm in diameter. No invasive cancer was identified. ER 90%, PR 40%.  . Breast cancer, right breast ([place]) 1994   Invasive ductal carcinoma.The patient underwent right mastectomy in 1994 for invasive ductal carcinoma.  . Discoid lupus 2005  . GERD (gastroesophageal reflux disease)   .  Heart murmur 2005  . Hypercholesterolemia 2012  . Mammographic microcalcification 2011  . Panic attack   . Personal history of chemotherapy 1994  . Personal history of colonic polyps    Colon polyps  . Personal history of tobacco use, presenting hazards to health   . PUD (peptic ulcer disease)    Last EGD 1/09  . Rectal polyp 2008   villous adenoma; last colonoscopy 1/09 WNL  . Skin cancer, basal cell 2015   right cheek [facility] surgery at [facility]  . Ulcer    Past Surgical History:  Procedure Laterality Date  . APPENDECTOMY  2011  . BREAST BIOPSY  12/27/2009   right breast  . CARDIAC  CATHETERIZATION  1996   Negative Cath  . COLONOSCOPY N/A 08/05/2014   Procedure: COLONOSCOPY;  Surgeon: [name], MD;  Location: [facility] ENDOSCOPY;  EGD, tubular adenoma 1.  . COLONOSCOPY W/ BIOPSIES  2005,2009   Dr. Bary Castilla. Normal exam 2009. Villous adenoma of the rectum in 2005 adenomatous polyp from the ascending colon and rectum in 2002.  . ESOPHAGOGASTRODUODENOSCOPY N/A 08/05/2014   Procedure: ESOPHAGOGASTRODUODENOSCOPY (EGD);  Surgeon: [name], MD;  Location: [facility] ENDOSCOPY;  Service: Endoscopy;  Laterality: N/A;  . ESOPHAGOGASTRODUODENOSCOPY (EGD) WITH PROPOFOL N/A 07/25/2017   Procedure: ESOPHAGOGASTRODUODENOSCOPY (EGD) WITH PROPOFOL;  Surgeon: [name], MD;  Location: [facility] ENDOSCOPY;  Service: Endoscopy;  Laterality: N/A;  . MASTECTOMY     bilateral  . SIMPLE MASTECTOMY  1993   right  . SIMPLE MASTECTOMY  2011   left  . TONSILLECTOMY  1997  . UPPER GI ENDOSCOPY  2003, 2005, 2009, 2012, 2014   2012 showed mild reflux changes without evidence of Barrett's epithelial changes 2014 biopsies at 37 cm showed changes suggestive of reflux esophagitis. No metaplasia.  Marland Kitchen VAGINAL HYSTERECTOMY  1977   Family History  Problem Relation Age of Onset  . Cancer Father        esophagus  . Cancer Maternal Aunt        breast cancer  . Cancer Maternal Grandmother        breast cancer    Social History   Socioeconomic History  . Marital status: Widowed    Spouse name: Not on file  . Number of children: Not on file  . Years of education: Not on file  . Highest education level: Not on file  Occupational History  . 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 Smoking status: Current Every Day Smoker    Packs/day: 0.50    Years: 40.00    Pack years: 20.00    Types: Cigarettes  . Smokeless tobacco: Never Used  . Tobacco comment: Pt wants to do hypnotizing  Substance and Sexual  Activity  . Alcohol use: Yes    Alcohol/week: 1.0 - 2.0 standard drinks    Types: 1 - 2 Standard drinks or equivalent per week  . Drug use: No  . Sexual activity: Not Currently  Other Topics Concern  . Not on file  Social History Narrative   Shalese grew up in [place]. She has two adult daughters. She lives in her home. Her youngest daughter and grandchildren live with her. She has two dogs. 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 She loves watching her grandson play basketball. She also loves gardening and being outdoors and working in the yard.  Social Determinants of Health   Financial Resource Strain:   . Difficulty of Paying Living Expenses:   Food Insecurity:   . Worried About Charity fundraiser in the Last Year:   . Arboriculturist in the Last Year:   Transportation Needs:   . Film/video editor (Medical):   Marland Kitchen Lack of Transportation (Non-Medical):   Physical Activity:   . Days of Exercise per Week:   . Minutes of Exercise per Session:   Stress:   . Feeling of Stress :   Social Connections:   . Frequency of Communication with Friends and Family:   . Frequency of Social Gatherings with Friends and Family:   . Attends Religious Services:   . Active Member of Clubs or Organizations:   . Attends Archivist Meetings:   Marland Kitchen Marital Status:   Intimate Partner Violence:   . Fear of Current or Ex-Partner:   . Emotionally Abused:   Marland Kitchen Physically Abused:   . Sexually Abused:      Objective:   Physical Exam Vitals and nursing note reviewed.  Constitutional:      Appearance: Normal appearance. She is well-developed, well-groomed and overweight.  HENT:     Head: Normocephalic and atraumatic.     Jaw: There is normal jaw occlusion.     Right Ear: Hearing, tympanic membrane and external ear normal. There is no impacted cerumen.     Left Ear: Hearing, ear canal and external ear normal. A middle ear effusion is present.      Ears:     Comments: Wax obscuring most of TM, ETD no erythema noted    Nose: Congestion and rhinorrhea present.     Mouth/Throat:     Mouth: Mucous membranes are moist.     Pharynx: Oropharynx is clear.  Eyes:     General: Lids are normal. Vision grossly intact. Gaze aligned appropriately.     Extraocular Movements: Extraocular movements intact.     Conjunctiva/sclera: Conjunctivae normal.     Pupils: Pupils are equal, round, and reactive to light.  Neck:     Thyroid: No thyroid mass, thyromegaly or thyroid tenderness.  Cardiovascular:     Rate and Rhythm: Normal rate and regular rhythm.     Pulses: Normal pulses.          Radial pulses are 2+ on the right side and 2+ on the left side.       Dorsalis pedis pulses are 2+ on the right side and 2+ on the left side.       Posterior tibial pulses are 2+ on the right side and 2+ on the left side.     Heart sounds: Normal heart sounds. No murmur. No friction rub. No gallop.   Pulmonary:     Effort: Pulmonary effort is normal. No tachypnea, bradypnea, accessory muscle usage, prolonged expiration, respiratory distress or retractions.     Breath sounds: Normal breath sounds and air entry.  Abdominal:     General: Bowel sounds are normal.     Palpations: Abdomen is soft.     Tenderness: There is no right CVA tenderness or left CVA tenderness.     Hernia: There is no hernia in the umbilical area or ventral area.  Musculoskeletal:        General: Normal range of motion.     Cervical back: Normal range of motion and neck supple.     Right lower leg: No edema.     Left  lower leg: No edema.  Feet:     Right foot:     Skin integrity: Skin integrity normal.     Toenail Condition: Right toenails are long.     Left foot:     Skin integrity: Skin integrity normal.     Toenail Condition: Left toenails are long.  Lymphadenopathy:     Head:     Right side of head: Occipital adenopathy present. No submental, submandibular, tonsillar, preauricular or  posterior auricular adenopathy.     Left side of head: No submental, submandibular, tonsillar, preauricular or posterior auricular adenopathy.     Cervical: No cervical adenopathy.     Upper Body:     Right upper body: No supraclavicular or epitrochlear adenopathy.     Left upper body: No supraclavicular or epitrochlear adenopathy.  Skin:    General: Skin is warm.     Capillary Refill: Capillary refill takes less than 2 seconds.  Neurological:     General: No focal deficit present.     Mental Status: She is alert and oriented to person, place, and time. Mental status is at baseline.     GCS: GCS eye subscore is 4. GCS verbal subscore is 5. GCS motor subscore is 6.     Cranial Nerves: Cranial nerves are intact.     Sensory: Sensation is intact.     Motor: Motor function is intact.     Coordination: Coordination is intact.     Deep Tendon Reflexes:     Reflex Scores:      Brachioradialis reflexes are 1+ on the right side and 1+ on the left side.      Patellar reflexes are 2+ on the right side and 2+ on the left side.      Achilles reflexes are 2+ on the right side and 2+ on the left side. Psychiatric:        Attention and Perception: Attention and perception normal.        Mood and Affect: Mood and affect normal.        Speech: Speech normal.        Behavior: Behavior is uncooperative.        Thought Content: Thought content normal.        Cognition and Memory: Cognition and memory normal.        Judgment: Judgment normal.           Assessment & Plan:  Biometric Screening Encounter for other general exam Recommended OTC Zyrtec and Flonase per package directions for allergy symptoms. Glucose  And Cholesterol are pending. Return to the clinic as needed. Patient verbalizes understanding and had no questions at discharge.

## 2019-07-10 NOTE — Addendum Note (Signed)
Addended by: Isidoro Santillana, Nira Conn R on: 07/10/2019 11:22 AM   Modules accepted: Level of Service

## 2019-07-11 ENCOUNTER — Encounter: Payer: Self-pay | Admitting: Family Medicine

## 2019-07-11 LAB — CBC WITH DIFFERENTIAL/PLATELET
Basophils Absolute: 0.1 10*3/uL (ref 0.0–0.2)
Basos: 1 %
EOS (ABSOLUTE): 0.2 10*3/uL (ref 0.0–0.4)
Eos: 2 %
Hematocrit: 41.5 % (ref 34.0–46.6)
Hemoglobin: 14.2 g/dL (ref 11.1–15.9)
Immature Grans (Abs): 0 10*3/uL (ref 0.0–0.1)
Immature Granulocytes: 0 %
Lymphocytes Absolute: 2.3 10*3/uL (ref 0.7–3.1)
Lymphs: 34 %
MCH: 32.1 pg (ref 26.6–33.0)
MCHC: 34.2 g/dL (ref 31.5–35.7)
MCV: 94 fL (ref 79–97)
Monocytes Absolute: 0.5 10*3/uL (ref 0.1–0.9)
Monocytes: 7 %
Neutrophils Absolute: 3.7 10*3/uL (ref 1.4–7.0)
Neutrophils: 56 %
Platelets: 194 10*3/uL (ref 150–450)
RBC: 4.42 x10E6/uL (ref 3.77–5.28)
RDW: 13.2 % (ref 11.7–15.4)
WBC: 6.7 10*3/uL (ref 3.4–10.8)

## 2019-07-11 LAB — COMPREHENSIVE METABOLIC PANEL
ALT: 24 IU/L (ref 0–32)
AST: 18 IU/L (ref 0–40)
Albumin/Globulin Ratio: 2.1 (ref 1.2–2.2)
Albumin: 4.5 g/dL (ref 3.7–4.7)
Alkaline Phosphatase: 101 IU/L (ref 39–117)
BUN/Creatinine Ratio: 11 — ABNORMAL LOW (ref 12–28)
BUN: 13 mg/dL (ref 8–27)
Bilirubin Total: 0.3 mg/dL (ref 0.0–1.2)
CO2: 22 mmol/L (ref 20–29)
Calcium: 10.4 mg/dL — ABNORMAL HIGH (ref 8.7–10.3)
Chloride: 104 mmol/L (ref 96–106)
Creatinine, Ser: 1.15 mg/dL — ABNORMAL HIGH (ref 0.57–1.00)
GFR calc Af Amer: 55 mL/min/{1.73_m2} — ABNORMAL LOW (ref >59)
GFR calc non Af Amer: 47 mL/min/{1.73_m2} — ABNORMAL LOW (ref >59)
Globulin, Total: 2.1 g/dL (ref 1.5–4.5)
Glucose: 99 mg/dL (ref 65–99)
Potassium: 4.7 mmol/L (ref 3.5–5.2)
Sodium: 139 mmol/L (ref 134–144)
Total Protein: 6.6 g/dL (ref 6.0–8.5)

## 2019-07-11 LAB — MICROSCOPIC EXAMINATION: Casts: NONE SEEN /lpf

## 2019-07-11 LAB — LIPID PANEL
Chol/HDL Ratio: 4.3 ratio (ref 0.0–4.4)
Cholesterol, Total: 221 mg/dL — ABNORMAL HIGH (ref 100–199)
HDL: 52 mg/dL (ref >39)
LDL Chol Calc (NIH): 146 mg/dL — ABNORMAL HIGH (ref 0–99)
Triglycerides: 127 mg/dL (ref 0–149)
VLDL Cholesterol Cal: 23 mg/dL (ref 5–40)

## 2019-07-11 LAB — UA/M W/RFLX CULTURE, COMP
Bilirubin, UA: NEGATIVE
Glucose, UA: NEGATIVE
Ketones, UA: NEGATIVE
Leukocytes,UA: NEGATIVE
Nitrite, UA: NEGATIVE
Protein,UA: NEGATIVE
RBC, UA: NEGATIVE
Specific Gravity, UA: 1.024 (ref 1.005–1.030)
Urobilinogen, Ur: 0.2 mg/dL (ref 0.2–1.0)
pH, UA: 5.5 (ref 5.0–7.5)

## 2019-07-11 LAB — TSH: TSH: 1.73 u[IU]/mL (ref 0.450–4.500)

## 2019-07-11 LAB — VITAMIN D 25 HYDROXY (VIT D DEFICIENCY, FRACTURES): Vit D, 25-Hydroxy: 19.6 ng/mL — ABNORMAL LOW (ref 30.0–100.0)

## 2019-07-11 MED ORDER — VITAMIN D (ERGOCALCIFEROL) 1.25 MG (50000 UNIT) PO CAPS: 50000.0000 [IU] | ORAL_CAPSULE | ORAL | 0 refills | Status: DC

## 2019-09-11 NOTE — Addendum Note (Signed)
Addended by: Fantashia Shupert, Nira Conn R on: 09/11/2019 11:15 AM   Modules accepted: Level of Service

## 2019-09-12 ENCOUNTER — Telehealth: Payer: Self-pay | Admitting: *Deleted

## 2019-09-12 NOTE — Telephone Encounter (Signed)
I attempted to reach this patient by calling all numbers provided her chart to notify her that her Low Dose Lung Cancer Screening Ct is due.

## 2019-09-25 ENCOUNTER — Ambulatory Visit: Payer: Managed Care, Other (non HMO) | Admitting: Family Medicine

## 2019-09-25 ENCOUNTER — Other Ambulatory Visit: Payer: Self-pay

## 2019-09-25 ENCOUNTER — Other Ambulatory Visit
Admission: RE | Admit: 2019-09-25 | Discharge: 2019-09-25 | Disposition: A | Discharge status: Home / Self Care | Payer: Managed Care, Other (non HMO) | Source: Ambulatory Visit | Attending: General Surgery | Admitting: General Surgery

## 2019-09-25 DIAGNOSIS — Z01812 Encounter for preprocedural laboratory examination: Secondary | ICD-10-CM | POA: Insufficient documentation

## 2019-09-25 DIAGNOSIS — Z20822 Contact with and (suspected) exposure to covid-19: Secondary | ICD-10-CM | POA: Diagnosis not present

## 2019-09-25 LAB — SARS CORONAVIRUS 2 (TAT 6-24 HRS): SARS Coronavirus 2: NEGATIVE

## 2019-09-28 ENCOUNTER — Telehealth: Payer: Self-pay

## 2019-09-28 DIAGNOSIS — Z87891 Personal history of nicotine dependence: Secondary | ICD-10-CM

## 2019-09-28 DIAGNOSIS — Z122 Encounter for screening for malignant neoplasm of respiratory organs: Secondary | ICD-10-CM

## 2019-09-28 NOTE — Telephone Encounter (Signed)
Patient being contacted to schedule her next lung screening CT scan.  (her last scan was June 2020).  Called patient at [phone] and cell phone not set up to accept messages. I then called [phone].  Female at home number states patient is not home.  She shared patient is having a colonoscopy and EGD tomorrow and asked if we could call her later in the week.  Patient will be called at a later time to schedule next scan.

## 2019-09-29 LAB — HM COLONOSCOPY

## 2019-10-02 ENCOUNTER — Other Ambulatory Visit: Payer: Self-pay

## 2019-10-02 ENCOUNTER — Encounter: Payer: Self-pay | Admitting: Family Medicine

## 2019-10-02 ENCOUNTER — Ambulatory Visit (INDEPENDENT_AMBULATORY_CARE_PROVIDER_SITE_OTHER): Payer: Managed Care, Other (non HMO) | Admitting: Family Medicine

## 2019-10-02 VITALS — BP 137/71 | HR 81 | Temp 98.2°F | Wt 146.0 lb

## 2019-10-02 DIAGNOSIS — F411 Generalized anxiety disorder: Secondary | ICD-10-CM

## 2019-10-02 MED ORDER — ALPRAZOLAM 0.5 MG PO TABS: 0.5000 mg | ORAL_TABLET | Freq: Two times a day (BID) | ORAL | 2 refills | Status: DC | PRN

## 2019-10-02 NOTE — Progress Notes (Addendum)
BP 137/71 (BP Location: Left Arm, Patient Position: Sitting, Cuff Size: Normal)   Pulse 81   Temp 98.2 F (36.8 C) (Oral)   Wt 146 lb (66.2 kg)   SpO2 95%   BMI 26.70 kg/m    Subjective:    Patient ID: Maureen [name], female    DOB: 1946/01/10, 74 y.o.   MRN: [id number]  HPI: Maureen [name] is a 74 y.o. female  Chief Complaint  Patient presents with  . Anxiety   ANXIETY/STRESS Duration:exacerbated Anxious mood: yes  Excessive worrying: yes Irritability: no  Sweating: no Nausea: no Palpitations:no Hyperventilation: no Panic attacks: no Agoraphobia: no  Obscessions/compulsions: no Depressed mood: yes Depression screen [facility] 2/9 10/05/2019 07/02/2019 02/27/2019 11/11/2018 06/19/2018  Decreased Interest 2 0 0 0 1  Down, Depressed, Hopeless 2 1 0 1 1  PHQ - 2 Score 4 1 0 1 2  Altered sleeping 2 0 0 1 0  Tired, decreased energy 2 0 0 0 0  Change in appetite 2 1 0 2 0  Feeling bad or failure about yourself  0 0 0 0 0  Trouble concentrating 2 0 0 0 0  Moving slowly or fidgety/restless 2 1 0 0 0  Suicidal thoughts 0 0 0 0 0  PHQ-9 Score 14 3 0 4 2  Difficult doing work/chores Somewhat difficult - Not difficult at all Somewhat difficult Not difficult at all  Some recent data might be [name]   GAD 7 : Generalized Anxiety Score 10/05/2019 07/02/2019 02/27/2019 11/11/2018  Nervous, Anxious, on Edge 2 1 1 1   Control/stop worrying 2 0 1 1  Worry too much - different things 2 0 0 0  Trouble relaxing 2 0 0 0  Restless 2 1 1  0  Easily annoyed or irritable 2 0 0 0  Afraid - awful might happen 2 0 2 0  Total GAD 7 Score 14 2 5 2   Anxiety Difficulty Somewhat difficult - Not difficult at all Not difficult at all   Anhedonia: no Weight changes: no Insomnia: no   Hypersomnia: no Fatigue/loss of energy: yes Feelings of worthlessness: no Feelings of guilt: yes Impaired concentration/indecisiveness: no Suicidal ideations: no  Crying spells: yes Recent Stressors/Life Changes:  yes   Relationship problems: yes   Family stress: yes     Financial stress: no    Job stress: no    Recent death/loss: yes   Relevant past medical, surgical, family and social history reviewed and updated as indicated. Interim medical history since our last visit reviewed. Allergies and medications reviewed and updated.  Review of Systems  Constitutional: Negative.   Respiratory: Negative.   Cardiovascular: Negative.   Musculoskeletal: Negative.   Skin: Negative.   Psychiatric/Behavioral: Positive for dysphoric mood. Negative for agitation, behavioral problems, confusion, decreased concentration, hallucinations, self-injury, sleep disturbance and suicidal ideas. The patient is nervous/anxious. The patient is not hyperactive.     Per HPI unless specifically indicated above     Objective:    BP 137/71 (BP Location: Left Arm, Patient Position: Sitting, Cuff Size: Normal)   Pulse 81   Temp 98.2 F (36.8 C) (Oral)   Wt 146 lb (66.2 kg)   SpO2 95%   BMI 26.70 kg/m   Wt Readings from Last 3 Encounters:  10/02/19 146 lb (66.2 kg)  07/10/19 149 lb (67.6 kg)  11/14/18 149 lb (67.6 kg)    Physical Exam Vitals and nursing note reviewed.  Constitutional:      General: She is  not in acute distress.    Appearance: Normal appearance. She is not ill-appearing, toxic-appearing or diaphoretic.  HENT:     Head: Normocephalic and atraumatic.     Right Ear: External ear normal.     Left Ear: External ear normal.     Nose: Nose normal.     Mouth/Throat:     Mouth: Mucous membranes are moist.     Pharynx: Oropharynx is clear.  Eyes:     General: No scleral icterus.       Right eye: No discharge.        Left eye: No discharge.     Extraocular Movements: Extraocular movements intact.     Conjunctiva/sclera: Conjunctivae normal.     Pupils: Pupils are equal, round, and reactive to light.  Cardiovascular:     Rate and Rhythm: Normal rate and regular rhythm.     Pulses: Normal pulses.      Heart sounds: Normal heart sounds. No murmur heard.  No friction rub. No gallop.   Pulmonary:     Effort: Pulmonary effort is normal. No respiratory distress.     Breath sounds: Normal breath sounds. No stridor. No wheezing, rhonchi or rales.  Chest:     Chest wall: No tenderness.  Musculoskeletal:        General: Normal range of motion.     Cervical back: Normal range of motion and neck supple.  Skin:    General: Skin is warm and dry.     Capillary Refill: Capillary refill takes less than 2 seconds.     Coloration: Skin is not jaundiced or pale.     Findings: No bruising, erythema, lesion or rash.  Neurological:     General: No focal deficit present.     Mental Status: She is alert and oriented to person, place, and time. Mental status is at baseline.  Psychiatric:        Mood and Affect: Mood normal.        Behavior: Behavior normal.        Thought Content: Thought content normal.        Judgment: Judgment normal.     Results for orders placed or performed in visit on 10/02/19  HM COLONOSCOPY  Result Value Ref Range   HM Colonoscopy See Report (in chart) See Report (in chart), Patient Reported      Assessment & Plan:   Problem List Items Addressed This Visit      Other   Generalized anxiety disorder - Primary    Slightly exacerbated due to friend's death. Otherwise doing OK. Continue current regimen. Continue to monitor. Refills given for 3 months. Call with any concerns.       Relevant Medications   ALPRAZolam (XANAX) 0.5 MG tablet       Follow up plan: Return in about 3 months (around 01/02/2020) for physical.

## 2019-10-02 NOTE — Assessment & Plan Note (Signed)
Slightly exacerbated due to friend's death. Otherwise doing OK. Continue current regimen. Continue to monitor. Refills given for 3 months. Call with any concerns.

## 2019-10-03 NOTE — Addendum Note (Signed)
Addended by: Lieutenant Diego on: 10/03/2019 12:44 PM   Modules accepted: Orders

## 2019-10-03 NOTE — Telephone Encounter (Signed)
Patient has been notified that annual lung cancer screening low dose CT scan is due currently or will be in near future. Confirmed that patient is within the age range of 55-77, and asymptomatic, (no signs or symptoms of lung cancer). Patient denies illness that would prevent curative treatment for lung cancer if found. Verified smoking history, (current, 41 pack year). The shared decision making visit was done 09/12/18. Patient is agreeable for CT scan being scheduled.

## 2019-10-15 ENCOUNTER — Ambulatory Visit
Admission: RE | Admit: 2019-10-15 | Discharge: 2019-10-15 | Disposition: A | Discharge status: Home / Self Care | Payer: Managed Care, Other (non HMO) | Source: Ambulatory Visit | Attending: Nurse Practitioner | Admitting: Nurse Practitioner

## 2019-10-15 ENCOUNTER — Ambulatory Visit: Admission: RE | Admit: 2019-10-15 | Payer: Managed Care, Other (non HMO) | Source: Ambulatory Visit

## 2019-10-15 ENCOUNTER — Other Ambulatory Visit: Payer: Self-pay

## 2019-10-15 DIAGNOSIS — Z122 Encounter for screening for malignant neoplasm of respiratory organs: Secondary | ICD-10-CM | POA: Insufficient documentation

## 2019-10-15 DIAGNOSIS — Z87891 Personal history of nicotine dependence: Secondary | ICD-10-CM | POA: Diagnosis not present

## 2019-10-16 ENCOUNTER — Other Ambulatory Visit: Payer: Self-pay | Admitting: General Surgery

## 2019-10-16 DIAGNOSIS — IMO0001 Reserved for inherently not codable concepts without codable children: Secondary | ICD-10-CM

## 2019-10-17 ENCOUNTER — Telehealth: Payer: Self-pay | Admitting: *Deleted

## 2019-10-17 NOTE — Telephone Encounter (Signed)
Noted  

## 2019-10-17 NOTE — Telephone Encounter (Signed)
Notified patient of LDCT lung cancer screening program results with recommendation for 6 month follow up imaging. Also notified of incidental findings noted below and is encouraged to discuss further with PCP who will receive a copy of this note and/or the CT report. Patient verbalizes understanding.   IMPRESSION: 1. Lung-RADS 3, probably benign findings. Short-term follow-up in 6 months is recommended with repeat low-dose chest CT without contrast (please use the following order, "CT CHEST LCS NODULE FOLLOW-UP W/O CM"). New left upper lobe pulmonary nodule of volume derived equivalent diameter 4.6 mm. 2. Aortic Atherosclerosis (ICD10-I70.0) and Emphysema (ICD10-J43.9). Coronary artery atherosclerosis. 3. Bilateral adrenal adenomas.

## 2020-01-06 ENCOUNTER — Other Ambulatory Visit: Payer: Self-pay

## 2020-01-06 ENCOUNTER — Ambulatory Visit (INDEPENDENT_AMBULATORY_CARE_PROVIDER_SITE_OTHER): Payer: Managed Care, Other (non HMO) | Admitting: Family Medicine

## 2020-01-06 ENCOUNTER — Encounter: Payer: Self-pay | Admitting: Family Medicine

## 2020-01-06 VITALS — BP 118/69 | HR 80 | Temp 98.1°F | Ht 62.0 in | Wt 148.0 lb

## 2020-01-06 DIAGNOSIS — J449 Chronic obstructive pulmonary disease, unspecified: Secondary | ICD-10-CM

## 2020-01-06 DIAGNOSIS — K219 Gastro-esophageal reflux disease without esophagitis: Secondary | ICD-10-CM

## 2020-01-06 DIAGNOSIS — Z23 Encounter for immunization: Secondary | ICD-10-CM | POA: Diagnosis not present

## 2020-01-06 DIAGNOSIS — E782 Mixed hyperlipidemia: Secondary | ICD-10-CM

## 2020-01-06 DIAGNOSIS — F411 Generalized anxiety disorder: Secondary | ICD-10-CM | POA: Diagnosis not present

## 2020-01-06 DIAGNOSIS — I714 Abdominal aortic aneurysm, without rupture, unspecified: Secondary | ICD-10-CM

## 2020-01-06 DIAGNOSIS — M329 Systemic lupus erythematosus, unspecified: Secondary | ICD-10-CM

## 2020-01-06 DIAGNOSIS — E559 Vitamin D deficiency, unspecified: Secondary | ICD-10-CM | POA: Diagnosis not present

## 2020-01-06 DIAGNOSIS — Z0000 Encounter for general adult medical examination without abnormal findings: Secondary | ICD-10-CM | POA: Diagnosis not present

## 2020-01-06 DIAGNOSIS — Z720 Tobacco use: Secondary | ICD-10-CM

## 2020-01-06 DIAGNOSIS — R5383 Other fatigue: Secondary | ICD-10-CM

## 2020-01-06 MED ORDER — OMEPRAZOLE 20 MG PO CPDR
20.0000 mg | DELAYED_RELEASE_CAPSULE | Freq: Every day | ORAL | 1 refills | Status: DC
Start: 2020-01-06 — End: 2020-09-13

## 2020-01-06 MED ORDER — SPIRIVA HANDIHALER 18 MCG IN CAPS
18.0000 ug | ORAL_CAPSULE | Freq: Every day | RESPIRATORY_TRACT | 12 refills | Status: DC
Start: 2020-01-06 — End: 2020-01-20

## 2020-01-06 MED ORDER — ALBUTEROL SULFATE HFA 108 (90 BASE) MCG/ACT IN AERS
INHALATION_SPRAY | RESPIRATORY_TRACT | 6 refills | Status: DC
Start: 2020-01-06 — End: 2020-07-26

## 2020-01-06 MED ORDER — VITAMIN D (ERGOCALCIFEROL) 1.25 MG (50000 UNIT) PO CAPS
50000.0000 [IU] | ORAL_CAPSULE | ORAL | 0 refills | Status: DC
Start: 2020-01-06 — End: 2020-06-11

## 2020-01-06 MED ORDER — BUDESONIDE-FORMOTEROL FUMARATE 160-4.5 MCG/ACT IN AERO
2.0000 | INHALATION_SPRAY | Freq: Two times a day (BID) | RESPIRATORY_TRACT | 3 refills | Status: DC
Start: 2020-01-06 — End: 2020-06-11

## 2020-01-06 MED ORDER — ALPRAZOLAM 0.5 MG PO TABS: 0.5000 mg | ORAL_TABLET | Freq: Two times a day (BID) | ORAL | 2 refills | Status: DC | PRN

## 2020-01-06 NOTE — Assessment & Plan Note (Signed)
Under good control on current regimen. Continue current regimen. Continue to monitor. Call with any concerns. Refills given for 3 months. Follow up 3 months.    

## 2020-01-06 NOTE — Assessment & Plan Note (Signed)
Rechecking labs today. Await results. Treat as needed.  °

## 2020-01-06 NOTE — Assessment & Plan Note (Signed)
Will recheck labs and treat as needed. Call with any concerns.

## 2020-01-06 NOTE — Assessment & Plan Note (Signed)
Under good control on current regimen. Continue current regimen. Continue to monitor. Call with any concerns. Refills given. Labs to be drawn shortly.  

## 2020-01-06 NOTE — Assessment & Plan Note (Signed)
Under good control on current regimen. Continue current regimen. Continue to monitor. Call with any concerns.   

## 2020-01-06 NOTE — Progress Notes (Signed)
BP 118/69 (BP Location: Right Arm, Cuff Size: Normal)   Pulse 80   Temp 98.1 F (36.7 C) (Oral)   Ht 5\' 2"  (1.575 m)   Wt 148 lb (67.1 kg)   SpO2 98%   BMI 27.07 kg/m    Subjective:    Patient ID: Maureen [name], female    DOB: 1945-05-03, 74 y.o.   MRN: [id number]  HPI: Maureen [name] is a 74 y.o. female presenting on 01/06/2020 for comprehensive medical examination. Current medical complaints include:  ANXIETY/STRESS Duration:stable Anxious mood: yes  Excessive worrying: yes Irritability: no  Sweating: no Nausea: no Palpitations:no Hyperventilation: no Panic attacks: no Agoraphobia: no  Obscessions/compulsions: no Depressed mood: no Depression screen [facility] 2/9 01/06/2020 10/05/2019 07/02/2019 02/27/2019 11/11/2018  Decreased Interest 2 2 0 0 0  Down, Depressed, Hopeless 2 2 1  0 1  PHQ - 2 Score 4 4 1  0 1  Altered sleeping 0 2 0 0 1  Tired, decreased energy 0 2 0 0 0  Change in appetite 0 2 1 0 2  Feeling bad or failure about yourself  0 0 0 0 0  Trouble concentrating 0 2 0 0 0  Moving slowly or fidgety/restless 0 2 1 0 0  Suicidal thoughts 0 0 0 0 0  PHQ-9 Score 4 14 3  0 4  Difficult doing work/chores Not difficult at all Somewhat difficult - Not difficult at all Somewhat difficult  Some recent data might be [name]   GAD 7 : Generalized Anxiety Score 01/06/2020 10/05/2019 07/02/2019 02/27/2019  Nervous, Anxious, on Edge 1 2 1 1   Control/stop worrying 1 2 0 1  Worry too much - different things 1 2 0 0  Trouble relaxing 0 2 0 0  Restless 0 2 1 1   Easily annoyed or irritable 0 2 0 0  Afraid - awful might happen 0 2 0 2  Total GAD 7 Score 3 14 2 5   Anxiety Difficulty Not difficult at all Somewhat difficult - Not difficult at all   Anhedonia: no Weight changes: no Insomnia: no   Hypersomnia: no Fatigue/loss of energy: no Feelings of worthlessness: no Feelings of guilt: no Impaired concentration/indecisiveness: no Suicidal ideations: no  Crying spells:  no Recent Stressors/Life Changes: no   Relationship problems: no   Family stress: no     Financial stress: no    Job stress: no    Recent death/loss: no  HYPERLIPIDEMIA Hyperlipidemia status: stable Satisfied with current treatment?  yes Side effects:  Not on anything Past cholesterol meds: none Supplements: fish oil Aspirin:  no The 10-year ASCVD risk score Mikey Bussing DC Jr., et al., 2013) is: 17.1%   Values used to calculate the score:     Age: 80 years     Sex: Female     Is Non-Hispanic African American: No     Diabetic: No     Tobacco smoker: Yes     Systolic Blood Pressure: 707 mmHg     Is BP treated: No     HDL Cholesterol: 52 mg/dL     Total Cholesterol: 221 mg/dL Chest pain:  no Coronary artery disease:  no  COPD COPD status: controlled Satisfied with current treatment?: no Oxygen use: no Dyspnea frequency: occasionally Cough frequency: occasionally Rescue inhaler frequency:  occasionally Limitation of activity: no Productive cough: no Pneumovax: Up to Date Influenza: Up to Date   Menopausal Symptoms: no  Depression Screen done today and results listed below:  Depression screen [facility] 2/9  01/06/2020 10/05/2019 07/02/2019 02/27/2019 11/11/2018  Decreased Interest 2 2 0 0 0  Down, Depressed, Hopeless 2 2 1  0 1  PHQ - 2 Score 4 4 1  0 1  Altered sleeping 0 2 0 0 1  Tired, decreased energy 0 2 0 0 0  Change in appetite 0 2 1 0 2  Feeling bad or failure about yourself  0 0 0 0 0  Trouble concentrating 0 2 0 0 0  Moving slowly or fidgety/restless 0 2 1 0 0  Suicidal thoughts 0 0 0 0 0  PHQ-9 Score 4 14 3  0 4  Difficult doing work/chores Not difficult at all Somewhat difficult - Not difficult at all Somewhat difficult  Some recent data might be [name]    Past Medical History:  Past Medical History:  Diagnosis Date  . Arthritis 2005  . 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 This was a histologic  grade 2 tumor measuring just under 1 cm in diameter. No invasive cancer was identified. ER 90%, PR 40%.  . Breast cancer, right breast ([place]) 1994   Invasive ductal carcinoma.The patient underwent right mastectomy in 1994 for invasive ductal carcinoma.  . Discoid lupus 2005  . GERD (gastroesophageal reflux disease)   . Heart murmur 2005  . Hypercholesterolemia 2012  . Mammographic microcalcification 2011  . Panic attack   . Personal history of chemotherapy 1994  . Personal history of colonic polyps    Colon polyps  . Personal history of tobacco use, presenting hazards to health   . PUD (peptic ulcer disease)    Last EGD 1/09  . Rectal polyp 2008   villous adenoma; last colonoscopy 1/09 WNL  . Skin cancer, basal cell 2015   right cheek Mohs surgery at [facility]  . Ulcer     Surgical History:  Past Surgical History:  Procedure Laterality Date  . APPENDECTOMY  2011  . BREAST BIOPSY  12/27/2009   right breast  . CARDIAC CATHETERIZATION  1996   Negative Cath  . COLONOSCOPY N/A 08/05/2014   Procedure: COLONOSCOPY;  Surgeon: [name], MD;  Location: [facility] ENDOSCOPY;  EGD, tubular adenoma 1.  . COLONOSCOPY W/ BIOPSIES  2005,2009   Dr. Bary Castilla. Normal exam 2009. Villous adenoma of the rectum in 2005 adenomatous polyp from the ascending colon and rectum in 2002.  . ESOPHAGOGASTRODUODENOSCOPY N/A 08/05/2014   Procedure: ESOPHAGOGASTRODUODENOSCOPY (EGD);  Surgeon: [name], MD;  Location: [facility] ENDOSCOPY;  Service: Endoscopy;  Laterality: N/A;  . ESOPHAGOGASTRODUODENOSCOPY (EGD) WITH PROPOFOL N/A 07/25/2017   Procedure: ESOPHAGOGASTRODUODENOSCOPY (EGD) WITH PROPOFOL;  Surgeon: [name], MD;  Location: [facility] ENDOSCOPY;  Service: Endoscopy;  Laterality: N/A;  . MASTECTOMY     bilateral  . SIMPLE MASTECTOMY  1993   right  . SIMPLE MASTECTOMY  2011   left  . TONSILLECTOMY  1997  . UPPER GI ENDOSCOPY  2003, 2005, 2009, 2012, 2014   2012 showed mild reflux  changes without evidence of Barrett's epithelial changes 2014 biopsies at 37 cm showed changes suggestive of reflux esophagitis. No metaplasia.  Marland Kitchen VAGINAL HYSTERECTOMY  1977    Medications:  Current Outpatient Medications on File Prior to Visit  Medication Sig  . acetaminophen (TYLENOL) 500 MG tablet Take 500 mg by mouth every 6 (six) hours as needed for headache.  . Ascorbic Acid (VITAMIN C) 100 MG tablet Take 100 mg by mouth daily.  . Calcium-Vitamins C &  D (CALCIUM/C/D PO) Take by mouth daily.  . Cholecalciferol (VITAMIN D3) 50 MCG (2000 UT) TABS Take by mouth.  . clobetasol cream (TEMOVATE) 2.99 % Apply 1 application topically daily.  . Homeopathic Products (ZICAM ALLERGY RELIEF NA) Place 2 sprays into the nose daily as needed (allergies).   Marland Kitchen KRILL OIL PO Take by mouth daily.  . Multiple Vitamin (MULTIVITAMIN PO) Take by mouth daily.  Marland Kitchen RA KRILL OIL 500 MG CAPS Take 500 mg by mouth daily.  . vitamin E 100 UNIT capsule Take by mouth daily.  Marland Kitchen zinc gluconate 50 MG tablet Take 50 mg by mouth every other day.   No current facility-administered medications on file prior to visit.    Allergies:  Allergies  Allergen Reactions  . Codeine Itching  . Sulfa Antibiotics Hives  . Sulfinpyrazone   . Erythromycin Rash  . Penicillins Rash    Social History:  Social History   Socioeconomic History  . Marital status: Widowed    Spouse name: Not on file  . Number of children: Not on file  . Years of education: Not on file  . Highest education level: Not on file  Occupational History  . 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 Smoking status: Current Every Day Smoker    Packs/day: 0.50    Years: 40.00    Pack years: 20.00    Types: Cigarettes  . Smokeless tobacco: Never Used  . Tobacco comment: Pt wants to do hypnotizing  Vaping Use  . Vaping Use: Never used  Substance and Sexual Activity  .  Alcohol use: Yes    Alcohol/week: 1.0 - 2.0 standard drink    Types: 1 - 2 Standard drinks or equivalent per week  . Drug use: No  . Sexual activity: Not Currently  Other Topics Concern  . Not on file  Social History Narrative   [place] grew up in [place]. She has two adult daughters. She lives in her home. Her youngest daughter and grandchildren live with her. She has two dogs. 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 She loves watching her grandson play basketball. She also loves gardening and being outdoors and working in the yard.    Social Determinants of Health   Financial Resource Strain:   . Difficulty of Paying Living Expenses: Not on file  Food Insecurity:   . Worried About Charity fundraiser in the Last Year: Not on file  . Ran Out of Food in the Last Year: Not on file  Transportation Needs:   . Lack of Transportation (Medical): Not on file  . Lack of Transportation (Non-Medical): Not on file  Physical Activity:   . Days of Exercise per Week: Not on file  . Minutes of Exercise per Session: Not on file  Stress:   . Feeling of Stress : Not on file  Social Connections:   . Frequency of Communication with Friends and Family: Not on file  . Frequency of Social Gatherings with Friends and Family: Not on file  . Attends Religious Services: Not on file  . Active Member of Clubs or Organizations: Not on file  . Attends Archivist Meetings: Not on file  . Marital Status: Not on file  Intimate Partner Violence:   . Fear of Current or Ex-Partner: Not on file  . Emotionally Abused: Not on file  . Physically Abused:  Not on file  . Sexually Abused: Not on file   Social History   Tobacco Use  Smoking Status Current Every Day Smoker  . Packs/day: 0.50  . Years: 40.00  . Pack years: 20.00  . Types: Cigarettes  Smokeless Tobacco Never Used  Tobacco Comment   Pt wants to do hypnotizing   Social History   Substance and Sexual  Activity  Alcohol Use Yes  . Alcohol/week: 1.0 - 2.0 standard drink  . Types: 1 - 2 Standard drinks or equivalent per week    Family History:  Family History  Problem Relation Age of Onset  . Cancer Father        esophagus  . Cancer Maternal Aunt        breast cancer  . Cancer Maternal Grandmother        breast cancer    Past medical history, surgical history, medications, allergies, family history and social history reviewed with patient today and changes made to appropriate areas of the chart.   Review of Systems  Constitutional: Negative.   HENT: Negative.   Eyes: Negative.   Respiratory: Positive for cough and wheezing. Negative for hemoptysis, sputum production and shortness of breath.   Cardiovascular: Negative.   Gastrointestinal: Positive for heartburn. Negative for abdominal pain, blood in stool, constipation, diarrhea, melena, nausea and vomiting.  Genitourinary: Negative.   Musculoskeletal: Negative.   Skin: Negative.   Neurological: Negative.   Endo/Heme/Allergies: Positive for environmental allergies. Negative for polydipsia. Does not bruise/bleed easily.  Psychiatric/Behavioral: Negative for depression, hallucinations, memory loss, substance abuse and suicidal ideas. The patient is nervous/anxious and has insomnia.     All other ROS negative except what is listed above and in the HPI.      Objective:    BP 118/69 (BP Location: Right Arm, Cuff Size: Normal)   Pulse 80   Temp 98.1 F (36.7 C) (Oral)   Ht 5\' 2"  (1.575 m)   Wt 148 lb (67.1 kg)   SpO2 98%   BMI 27.07 kg/m   Wt Readings from Last 3 Encounters:  01/06/20 148 lb (67.1 kg)  10/15/19 147 lb (66.7 kg)  10/02/19 146 lb (66.2 kg)    Physical Exam Vitals and nursing note reviewed.  Constitutional:      General: She is not in acute distress.    Appearance: Normal appearance. She is not ill-appearing, toxic-appearing or diaphoretic.  HENT:     Head: Normocephalic and atraumatic.     Right  Ear: Tympanic membrane, ear canal and external ear normal. There is no impacted cerumen.     Left Ear: Tympanic membrane, ear canal and external ear normal. There is no impacted cerumen.     Nose: Nose normal. No congestion or rhinorrhea.     Mouth/Throat:     Mouth: Mucous membranes are moist.     Pharynx: Oropharynx is clear. No oropharyngeal exudate or posterior oropharyngeal erythema.  Eyes:     General: No scleral icterus.       Right eye: No discharge.        Left eye: No discharge.     Extraocular Movements: Extraocular movements intact.     Conjunctiva/sclera: Conjunctivae normal.     Pupils: Pupils are equal, round, and reactive to light.  Neck:     Vascular: No carotid bruit.  Cardiovascular:     Rate and Rhythm: Normal rate and regular rhythm.     Pulses: Normal pulses.     Heart sounds: No murmur  heard.  No friction rub. No gallop.   Pulmonary:     Effort: Pulmonary effort is normal. No respiratory distress.     Breath sounds: Normal breath sounds. No stridor. No wheezing, rhonchi or rales.  Chest:     Chest wall: No tenderness.  Abdominal:     General: Abdomen is flat. Bowel sounds are normal. There is no distension.     Palpations: Abdomen is soft. There is no mass.     Tenderness: There is no abdominal tenderness. There is no right CVA tenderness, left CVA tenderness, guarding or rebound.     Hernia: No hernia is present.  Genitourinary:    Comments: Breast and pelvic exams deferred with shared decision making Musculoskeletal:        General: No swelling, tenderness, deformity or signs of injury.     Cervical back: Normal range of motion and neck supple. No rigidity. No muscular tenderness.     Right lower leg: No edema.     Left lower leg: No edema.  Lymphadenopathy:     Cervical: No cervical adenopathy.  Skin:    General: Skin is warm and dry.     Capillary Refill: Capillary refill takes less than 2 seconds.     Coloration: Skin is not jaundiced or pale.      Findings: No bruising, erythema, lesion or rash.  Neurological:     General: No focal deficit present.     Mental Status: She is alert and oriented to person, place, and time. Mental status is at baseline.     Cranial Nerves: No cranial nerve deficit.     Sensory: No sensory deficit.     Motor: No weakness.     Coordination: Coordination normal.     Gait: Gait normal.     Deep Tendon Reflexes: Reflexes normal.  Psychiatric:        Mood and Affect: Mood normal.        Behavior: Behavior normal.        Thought Content: Thought content normal.        Judgment: Judgment normal.     Results for orders placed or performed in visit on 10/02/19  HM COLONOSCOPY  Result Value Ref Range   HM Colonoscopy See Report (in chart) See Report (in chart), Patient Reported      Assessment & Plan:   Problem List Items Addressed This Visit      Cardiovascular and Mediastinum   Abdominal aortic aneurysm (AAA) without rupture (HCC)    Stable. Continue to monitor. Keep BP and cholesterol under good control.         Respiratory   COPD (chronic obstructive pulmonary disease) with chronic bronchitis (HCC)    Under good control on current regimen. Continue current regimen. Continue to monitor. Call with any concerns. Refills given. Labs to be drawn shortly.       Relevant Medications   albuterol (VENTOLIN HFA) 108 (90 Base) MCG/ACT inhaler   budesonide-formoterol (SYMBICORT) 160-4.5 MCG/ACT inhaler   tiotropium (SPIRIVA HANDIHALER) 18 MCG inhalation capsule     Digestive   Esophageal reflux    Under good control on current regimen. Continue current regimen. Continue to monitor. Call with any concerns. Refills given. Labs to be drawn shortly.       Relevant Medications   omeprazole (PRILOSEC) 20 MG capsule   Other Relevant Orders   CBC with Differential/Platelet     Other   Lupus ([place])    Under good control on current  regimen. Continue current regimen. Continue to monitor. Call with any  concerns.        Generalized anxiety disorder    Under good control on current regimen. Continue current regimen. Continue to monitor. Call with any concerns. Refills given for 3 months. Follow up 3 months.        Relevant Medications   ALPRAZolam (XANAX) 0.5 MG tablet   Hyperlipidemia    Rechecking labs today. Await results. Treat as needed.       Relevant Orders   Comprehensive metabolic panel   Lipid Panel w/o Chol/HDL Ratio   Tobacco abuse    Not ready to quit. Has had her CT scan. Continue to monitor.       Relevant Orders   Urinalysis, Routine w reflex microscopic   Vitamin D deficiency    Will recheck labs and treat as needed. Call with any concerns.       Relevant Orders   VITAMIN D 25 Hydroxy (Vit-D Deficiency, Fractures)    Other Visit Diagnoses    Routine general medical examination at a health care facility    -  Primary   Vaccines up to date. Screening labs checked today. Pap N/A. Mammogram through breast surgeon. Colonoscopy up to date. Continue diet and exercise. Call w concern   Fatigue, unspecified type       Labs to be drawn shortly. Await results.    Relevant Orders   Urinalysis, Routine w reflex microscopic   TSH   Need for immunization against influenza       Flu shot given today.   Relevant Orders   Flu Vaccine QUAD 36+ mos IM (Completed)       Follow up plan: Return in about 3 months (around 04/07/2020).   LABORATORY TESTING:  - Pap smear: not applicable  IMMUNIZATIONS:   - Tdap: Tetanus vaccination status reviewed: last tetanus booster within 10 years. - Influenza: Administered today - Pneumovax: Up to date - Prevnar: Up to date - COVID: Up to date  SCREENING: -Mammogram: Through Dr. Bary Castilla  - Colonoscopy: Up to date  - Bone Density: Up to date   PATIENT COUNSELING:   Advised to take 1 mg of folate supplement per day if capable of pregnancy.   Sexuality: Discussed sexually transmitted diseases, partner selection, use of  condoms, avoidance of unintended pregnancy  and contraceptive alternatives.   Advised to avoid cigarette smoking.  I discussed with the patient that most people either abstain from alcohol or drink within safe limits (<=14/week and <=4 drinks/occasion for males, <=7/weeks and <= 3 drinks/occasion for females) and that the risk for alcohol disorders and other health effects rises proportionally with the number of drinks per week and how often a drinker exceeds daily limits.  Discussed cessation/primary prevention of drug use and availability of treatment for abuse.   Diet: Encouraged to adjust caloric intake to maintain  or achieve ideal body weight, to reduce intake of dietary saturated fat and total fat, to limit sodium intake by avoiding high sodium foods and not adding table salt, and to maintain adequate dietary potassium and calcium preferably from fresh fruits, vegetables, and low-fat dairy products.    stressed the importance of regular exercise  Injury prevention: Discussed safety belts, safety helmets, smoke detector, smoking near bedding or upholstery.   Dental health: Discussed importance of regular tooth brushing, flossing, and dental visits.    NEXT PREVENTATIVE PHYSICAL DUE IN 1 YEAR. Return in about 3 months (around 04/07/2020).

## 2020-01-06 NOTE — Patient Instructions (Signed)
Health Maintenance After Age 73 After age 73, you are at a higher risk for certain long-term diseases and infections as well as injuries from falls. Falls are a major cause of broken bones and head injuries in people who are older than age 73. Getting regular preventive care can help to keep you healthy and well. Preventive care includes getting regular testing and making lifestyle changes as recommended by your health care provider. Talk with your health care provider about:  Which screenings and tests you should have. A screening is a test that checks for a disease when you have no symptoms.  A diet and exercise plan that is right for you. What should I know about screenings and tests to prevent falls? Screening and testing are the best ways to find a health problem early. Early diagnosis and treatment give you the best chance of managing medical conditions that are common after age 73. Certain conditions and lifestyle choices may make you more likely to have a fall. Your health care provider may recommend:  Regular vision checks. Poor vision and conditions such as cataracts can make you more likely to have a fall. If you wear glasses, make sure to get your prescription updated if your vision changes.  Medicine review. Work with your health care provider to regularly review all of the medicines you are taking, including over-the-counter medicines. Ask your health care provider about any side effects that may make you more likely to have a fall. Tell your health care provider if any medicines that you take make you feel dizzy or sleepy.  Osteoporosis screening. Osteoporosis is a condition that causes the bones to get weaker. This can make the bones weak and cause them to break more easily.  Blood pressure screening. Blood pressure changes and medicines to control blood pressure can make you feel dizzy.  Strength and balance checks. Your health care provider may recommend certain tests to check your  strength and balance while standing, walking, or changing positions.  Foot health exam. Foot pain and numbness, as well as not wearing proper footwear, can make you more likely to have a fall.  Depression screening. You may be more likely to have a fall if you have a fear of falling, feel emotionally low, or feel unable to do activities that you used to do.  Alcohol use screening. Using too much alcohol can affect your balance and may make you more likely to have a fall. What actions can I take to lower my risk of falls? General instructions  Talk with your health care provider about your risks for falling. Tell your health care provider if: ? You fall. Be sure to tell your health care provider about all falls, even ones that seem minor. ? You feel dizzy, sleepy, or off-balance.  Take over-the-counter and prescription medicines only as told by your health care provider. These include any supplements.  Eat a healthy diet and maintain a healthy weight. A healthy diet includes low-fat dairy products, low-fat (lean) meats, and fiber from whole grains, beans, and lots of fruits and vegetables. Home safety  Remove any tripping hazards, such as rugs, cords, and clutter.  Install safety equipment such as grab bars in bathrooms and safety rails on stairs.  Keep rooms and walkways well-lit. Activity   Follow a regular exercise program to stay fit. This will help you maintain your balance. Ask your health care provider what types of exercise are appropriate for you.  If you need a cane or   walker, use it as recommended by your health care provider.  Wear supportive shoes that have nonskid soles. Lifestyle  Do not drink alcohol if your health care provider tells you not to drink.  If you drink alcohol, limit how much you have: ? 0-1 drink a day for women. ? 0-2 drinks a day for men.  Be aware of how much alcohol is in your drink. In the [place] one drink equals one typical bottle of beer (12  oz), one-half glass of wine (5 oz), or one shot of hard liquor (1 oz).  Do not use any products that contain nicotine or tobacco, such as cigarettes and e-cigarettes. If you need help quitting, ask your health care provider. Summary  Having a healthy lifestyle and getting preventive care can help to protect your health and wellness after age 73.  Screening and testing are the best way to find a health problem early and help you avoid having a fall. Early diagnosis and treatment give you the best chance for managing medical conditions that are more common for people who are older than age 73.  Falls are a major cause of broken bones and head injuries in people who are older than age 73. Take precautions to prevent a fall at home.  Work with your health care provider to learn what changes you can make to improve your health and wellness and to prevent falls. This information is not intended to replace advice given to you by your health care provider. Make sure you discuss any questions you have with your health care provider. Document Revised: 06/27/2018 Document Reviewed: 01/17/2017 Elsevier Patient Education  2020 Elsevier Inc.  

## 2020-01-06 NOTE — Assessment & Plan Note (Signed)
Stable. Continue to monitor. Keep BP and cholesterol under good control.

## 2020-01-06 NOTE — Assessment & Plan Note (Signed)
Not ready to quit. Has had her CT scan. Continue to monitor.

## 2020-01-09 ENCOUNTER — Telehealth: Payer: Self-pay

## 2020-01-09 NOTE — Telephone Encounter (Signed)
PA for Spiriva initiated and submitted via Cover My Meds. Key: [id number]

## 2020-01-20 MED ORDER — INCRUSE ELLIPTA 62.5 MCG/INH IN AEPB: 1.0000 | INHALATION_SPRAY | Freq: Every day | RESPIRATORY_TRACT | 6 refills | Status: DC

## 2020-01-20 NOTE — Telephone Encounter (Signed)
Received denial letter and placed in review folder. Patient must try and fail Incruse before Spiriva will be covered.

## 2020-01-26 ENCOUNTER — Other Ambulatory Visit: Payer: Self-pay | Admitting: Family Medicine

## 2020-01-26 NOTE — Telephone Encounter (Signed)
Requested medication (s) are due for refill today- no  Requested medication (s) are on the active medication list -yes  Future visit scheduled -yes  Last refill: 01/06/20 #45 2 RF  Notes to clinic: Request RF non delegated Rx- should have RF  Requested Prescriptions  Pending Prescriptions Disp Refills   ALPRAZolam (XANAX) 0.5 MG tablet [Pharmacy Med Name: ALPRAZOLAM 0.5 MG TABLET] 45 tablet     Sig: TAKE 1 TABLET BY MOUTH TWICE A DAY AS NEEDED FOR ANXIETY      Not Delegated - Psychiatry:  Anxiolytics/Hypnotics Failed - 01/26/2020 11:54 AM      Failed - This refill cannot be delegated      Failed - Urine Drug Screen completed in last 360 days      Passed - Valid encounter within last 6 months    Recent Outpatient Visits           2 weeks ago Routine general medical examination at a health care facility   [facility], [place], DO   3 months ago Generalized anxiety disorder   [place], Megan P, DO   6 months ago Generalized anxiety disorder   [employer], [place], DO   9 months ago [place], [place], DO   11 months ago Exposure to COVID-19 virus   [employer], [place], DO       Future Appointments             In 2 months [place], Megan P, DO [employer], [place]                Requested Prescriptions  Pending Prescriptions Disp Refills   ALPRAZolam (XANAX) 0.5 MG tablet [Pharmacy Med Name: ALPRAZOLAM 0.5 MG TABLET] 45 tablet     Sig: TAKE 1 TABLET BY MOUTH TWICE A DAY AS NEEDED FOR ANXIETY      Not Delegated - Psychiatry:  Anxiolytics/Hypnotics Failed - 01/26/2020 11:54 AM      Failed - This refill cannot be delegated      Failed - Urine Drug Screen completed in last 360 days      Passed - Valid encounter within last 6 months    Recent Outpatient Visits           2 weeks ago Routine general medical examination at a health care  facility   [facility], [place] P, DO   3 months ago Generalized anxiety disorder   [place], Megan P, DO   6 months ago Generalized anxiety disorder   [employer], [place], DO   9 months ago [place], DO   11 months ago Exposure to COVID-19 virus   [employer], [place], DO       Future Appointments             In 2 months Wynetta Emery, Barb Merino, DO [employer], [facility]

## 2020-01-28 ENCOUNTER — Telehealth: Payer: Self-pay

## 2020-01-28 NOTE — Telephone Encounter (Signed)
PA started for Spiriva HandiHaler 75mcg capsules, waiting on response

## 2020-01-30 ENCOUNTER — Other Ambulatory Visit: Payer: Managed Care, Other (non HMO)

## 2020-02-03 ENCOUNTER — Other Ambulatory Visit: Payer: Self-pay

## 2020-02-03 ENCOUNTER — Other Ambulatory Visit: Payer: Managed Care, Other (non HMO)

## 2020-02-03 DIAGNOSIS — E559 Vitamin D deficiency, unspecified: Secondary | ICD-10-CM | POA: Diagnosis not present

## 2020-02-03 DIAGNOSIS — E782 Mixed hyperlipidemia: Secondary | ICD-10-CM

## 2020-02-03 DIAGNOSIS — E785 Hyperlipidemia, unspecified: Secondary | ICD-10-CM | POA: Diagnosis not present

## 2020-02-03 DIAGNOSIS — R5383 Other fatigue: Secondary | ICD-10-CM | POA: Diagnosis not present

## 2020-02-03 DIAGNOSIS — K219 Gastro-esophageal reflux disease without esophagitis: Secondary | ICD-10-CM | POA: Diagnosis not present

## 2020-02-04 ENCOUNTER — Encounter: Payer: Self-pay | Admitting: Family Medicine

## 2020-02-04 LAB — LIPID PANEL WITH LDL/HDL RATIO
Cholesterol, Total: 239 mg/dL — ABNORMAL HIGH (ref 100–199)
HDL: 48 mg/dL (ref >39)
LDL Chol Calc (NIH): 169 mg/dL — ABNORMAL HIGH (ref 0–99)
LDL/HDL Ratio: 3.5 ratio — ABNORMAL HIGH (ref 0.0–3.2)
Triglycerides: 124 mg/dL (ref 0–149)
VLDL Cholesterol Cal: 22 mg/dL (ref 5–40)

## 2020-02-04 LAB — URINALYSIS, ROUTINE W REFLEX MICROSCOPIC
Bilirubin, UA: NEGATIVE
Glucose, UA: NEGATIVE
Ketones, UA: NEGATIVE
Leukocytes,UA: NEGATIVE
Nitrite, UA: NEGATIVE
Protein,UA: NEGATIVE
RBC, UA: NEGATIVE
Specific Gravity, UA: 1.016 (ref 1.005–1.030)
Urobilinogen, Ur: 0.2 mg/dL (ref 0.2–1.0)
pH, UA: 5 (ref 5.0–7.5)

## 2020-02-04 LAB — CBC WITH DIFFERENTIAL/PLATELET
Basophils Absolute: 0.1 10*3/uL (ref 0.0–0.2)
Basos: 1 %
EOS (ABSOLUTE): 0.1 10*3/uL (ref 0.0–0.4)
Eos: 2 %
Hematocrit: 40.2 % (ref 34.0–46.6)
Hemoglobin: 13.5 g/dL (ref 11.1–15.9)
Immature Grans (Abs): 0 10*3/uL (ref 0.0–0.1)
Immature Granulocytes: 0 %
Lymphocytes Absolute: 2.4 10*3/uL (ref 0.7–3.1)
Lymphs: 38 %
MCH: 31.2 pg (ref 26.6–33.0)
MCHC: 33.6 g/dL (ref 31.5–35.7)
MCV: 93 fL (ref 79–97)
Monocytes Absolute: 0.4 10*3/uL (ref 0.1–0.9)
Monocytes: 6 %
Neutrophils Absolute: 3.2 10*3/uL (ref 1.4–7.0)
Neutrophils: 53 %
Platelets: 194 10*3/uL (ref 150–450)
RBC: 4.33 x10E6/uL (ref 3.77–5.28)
RDW: 12.9 % (ref 11.7–15.4)
WBC: 6.1 10*3/uL (ref 3.4–10.8)

## 2020-02-04 LAB — COMPREHENSIVE METABOLIC PANEL
ALT: 15 IU/L (ref 0–32)
AST: 13 IU/L (ref 0–40)
Albumin/Globulin Ratio: 2.4 — ABNORMAL HIGH (ref 1.2–2.2)
Albumin: 4.6 g/dL (ref 3.7–4.7)
Alkaline Phosphatase: 96 IU/L (ref 44–121)
BUN/Creatinine Ratio: 13 (ref 12–28)
BUN: 13 mg/dL (ref 8–27)
Bilirubin Total: 0.3 mg/dL (ref 0.0–1.2)
CO2: 22 mmol/L (ref 20–29)
Calcium: 9.6 mg/dL (ref 8.7–10.3)
Chloride: 103 mmol/L (ref 96–106)
Creatinine, Ser: 1.02 mg/dL — ABNORMAL HIGH (ref 0.57–1.00)
GFR calc Af Amer: 63 mL/min/{1.73_m2} (ref >59)
GFR calc non Af Amer: 55 mL/min/{1.73_m2} — ABNORMAL LOW (ref >59)
Globulin, Total: 1.9 g/dL (ref 1.5–4.5)
Glucose: 93 mg/dL (ref 65–99)
Potassium: 4 mmol/L (ref 3.5–5.2)
Sodium: 140 mmol/L (ref 134–144)
Total Protein: 6.5 g/dL (ref 6.0–8.5)

## 2020-02-04 LAB — TSH: TSH: 1.65 u[IU]/mL (ref 0.450–4.500)

## 2020-02-04 LAB — VITAMIN D 25 HYDROXY (VIT D DEFICIENCY, FRACTURES): Vit D, 25-Hydroxy: 27.1 ng/mL — ABNORMAL LOW (ref 30.0–100.0)

## 2020-02-24 ENCOUNTER — Other Ambulatory Visit: Payer: Self-pay | Admitting: General Surgery

## 2020-02-24 DIAGNOSIS — I700 Atherosclerosis of aorta: Secondary | ICD-10-CM

## 2020-02-24 DIAGNOSIS — J439 Emphysema, unspecified: Secondary | ICD-10-CM

## 2020-02-24 DIAGNOSIS — R911 Solitary pulmonary nodule: Secondary | ICD-10-CM

## 2020-03-11 ENCOUNTER — Other Ambulatory Visit: Payer: Self-pay | Admitting: General Surgery

## 2020-03-25 NOTE — Addendum Note (Signed)
Addended byHervey Ard on: 03/25/2020 08:41 AM   Modules accepted: Orders

## 2020-04-08 ENCOUNTER — Ambulatory Visit: Payer: Managed Care, Other (non HMO) | Admitting: Family Medicine

## 2020-04-16 ENCOUNTER — Telehealth: Payer: Self-pay | Admitting: *Deleted

## 2020-04-16 NOTE — Telephone Encounter (Signed)
Attempted to contact patient to schedule annual lung screening scan. However, there is no answer or voicemail option.

## 2020-04-19 ENCOUNTER — Ambulatory Visit: Payer: Managed Care, Other (non HMO)

## 2020-04-19 ENCOUNTER — Other Ambulatory Visit: Payer: Self-pay

## 2020-04-19 ENCOUNTER — Other Ambulatory Visit: Payer: Self-pay | Admitting: General Surgery

## 2020-04-19 ENCOUNTER — Ambulatory Visit
Admission: RE | Admit: 2020-04-19 | Discharge: 2020-04-19 | Disposition: A | Discharge status: Home / Self Care | Payer: Managed Care, Other (non HMO) | Source: Ambulatory Visit | Attending: General Surgery | Admitting: General Surgery

## 2020-04-19 DIAGNOSIS — Z87891 Personal history of nicotine dependence: Secondary | ICD-10-CM

## 2020-04-19 DIAGNOSIS — D3502 Benign neoplasm of left adrenal gland: Secondary | ICD-10-CM | POA: Insufficient documentation

## 2020-04-19 DIAGNOSIS — J439 Emphysema, unspecified: Secondary | ICD-10-CM | POA: Insufficient documentation

## 2020-04-19 DIAGNOSIS — R918 Other nonspecific abnormal finding of lung field: Secondary | ICD-10-CM

## 2020-04-19 DIAGNOSIS — D3501 Benign neoplasm of right adrenal gland: Secondary | ICD-10-CM | POA: Insufficient documentation

## 2020-04-19 DIAGNOSIS — I2510 Atherosclerotic heart disease of native coronary artery without angina pectoris: Secondary | ICD-10-CM | POA: Diagnosis not present

## 2020-04-19 DIAGNOSIS — I700 Atherosclerosis of aorta: Secondary | ICD-10-CM | POA: Diagnosis not present

## 2020-04-19 DIAGNOSIS — Z122 Encounter for screening for malignant neoplasm of respiratory organs: Secondary | ICD-10-CM | POA: Insufficient documentation

## 2020-04-21 ENCOUNTER — Encounter: Payer: Self-pay | Admitting: *Deleted

## 2020-05-25 ENCOUNTER — Telehealth: Payer: Self-pay | Admitting: *Deleted

## 2020-05-25 ENCOUNTER — Telehealth: Payer: Self-pay | Admitting: Family Medicine

## 2020-05-25 NOTE — Telephone Encounter (Signed)
Completed in other encounter.

## 2020-05-25 NOTE — Telephone Encounter (Signed)
Stop symbicort- start incruse. Should already be at the pharmacy

## 2020-05-25 NOTE — Telephone Encounter (Signed)
I'm not sure what inhaler she's talking about. I have not seen her since October- can we get some more information please?

## 2020-05-25 NOTE — Telephone Encounter (Signed)
Patient notified

## 2020-05-25 NOTE — Telephone Encounter (Signed)
Spoke with patient, insurance will no longer cover the Symbicort.   Please let me know if you will change this inhaler to something else?

## 2020-05-25 NOTE — Telephone Encounter (Signed)
Pt said she was called in a new inhaler and did not get it because she did not know if she should stop Symbicort. Please advise pt. On what to do. Pt ask for a call to the [phone]

## 2020-05-25 NOTE — Telephone Encounter (Signed)
-----   Message from Jerelene Redden, [place] sent at 05/25/2020  2:29 PM EST -----  ----- Message ----- From: Gearldine Bienenstock, NT Sent: 05/25/2020  10:38 AM EST To: [facility]

## 2020-06-11 ENCOUNTER — Other Ambulatory Visit: Payer: Self-pay

## 2020-06-11 ENCOUNTER — Ambulatory Visit: Payer: Managed Care, Other (non HMO) | Admitting: Family Medicine

## 2020-06-11 ENCOUNTER — Encounter: Payer: Self-pay | Admitting: Family Medicine

## 2020-06-11 VITALS — BP 129/69 | HR 78 | Temp 98.0°F | Wt 150.6 lb

## 2020-06-11 DIAGNOSIS — F411 Generalized anxiety disorder: Secondary | ICD-10-CM | POA: Diagnosis not present

## 2020-06-11 MED ORDER — ALPRAZOLAM 0.5 MG PO TABS: 0.5000 mg | ORAL_TABLET | Freq: Two times a day (BID) | ORAL | 2 refills | Status: DC | PRN

## 2020-06-11 NOTE — Progress Notes (Signed)
BP 129/69   Pulse 78   Temp 98 F (36.7 C)   Wt 150 lb 9.6 oz (68.3 kg)   SpO2 97%   BMI 27.55 kg/m    Subjective:    Patient ID: Maureen [name], female    DOB: Jun 02, 1945, 75 y.o.   MRN: [id number]  HPI: Maureen [name] is a 75 y.o. female  Chief Complaint  Patient presents with  . Anxiety   ANXIETY/STRESS- has not been doing well. Her daughter-in-law has breast cancer and has not been doing well. Her oldest grandson OD'd over the weekend and had to be brought back with Narcan. She has been under a lot of stress  Duration: chronic Status:exacerbated Anxious mood: yes  Excessive worrying: yes Irritability: yes  Sweating: no Nausea: no Palpitations:no Hyperventilation: no Panic attacks: no Agoraphobia: no  Obscessions/compulsions: no Depressed mood: no Depression screen [facility] 2/9 01/06/2020 10/05/2019 07/02/2019 02/27/2019 11/11/2018  Decreased Interest 2 2 0 0 0  Down, Depressed, Hopeless 2 2 1  0 1  PHQ - 2 Score 4 4 1  0 1  Altered sleeping 0 2 0 0 1  Tired, decreased energy 0 2 0 0 0  Change in appetite 0 2 1 0 2  Feeling bad or failure about yourself  0 0 0 0 0  Trouble concentrating 0 2 0 0 0  Moving slowly or fidgety/restless 0 2 1 0 0  Suicidal thoughts 0 0 0 0 0  PHQ-9 Score 4 14 3  0 4  Difficult doing work/chores Not difficult at all Somewhat difficult - Not difficult at all Somewhat difficult  Some recent data might be [name]   Anhedonia: no Weight changes: no Insomnia: no   Hypersomnia: no Fatigue/loss of energy: yes Feelings of worthlessness: no Feelings of guilt: no Impaired concentration/indecisiveness: no Suicidal ideations: no  Crying spells: no Recent Stressors/Life Changes: yes   Relationship problems: no   Family stress: yes     Financial stress: no    Job stress: no    Recent death/loss: no  Relevant past medical, surgical, family and social history reviewed and updated as indicated. Interim medical history since our last visit  reviewed. Allergies and medications reviewed and updated.  Review of Systems  Constitutional: Negative.   Respiratory: Negative.   Cardiovascular: Negative.   Gastrointestinal: Negative.   Musculoskeletal: Negative.   Psychiatric/Behavioral: Negative for agitation, behavioral problems, confusion, decreased concentration, dysphoric mood, hallucinations, self-injury, sleep disturbance and suicidal ideas. The patient is nervous/anxious. The patient is not hyperactive.     Per HPI unless specifically indicated above     Objective:    BP 129/69   Pulse 78   Temp 98 F (36.7 C)   Wt 150 lb 9.6 oz (68.3 kg)   SpO2 97%   BMI 27.55 kg/m   Wt Readings from Last 3 Encounters:  06/11/20 150 lb 9.6 oz (68.3 kg)  01/06/20 148 lb (67.1 kg)  10/15/19 147 lb (66.7 kg)    Physical Exam Vitals and nursing note reviewed.  Constitutional:      General: She is not in acute distress.    Appearance: Normal appearance. She is not ill-appearing, toxic-appearing or diaphoretic.  HENT:     Head: Normocephalic and atraumatic.     Right Ear: External ear normal.     Left Ear: External ear normal.     Nose: Nose normal.     Mouth/Throat:     Mouth: Mucous membranes are moist.     Pharynx:  Oropharynx is clear.  Eyes:     General: No scleral icterus.       Right eye: No discharge.        Left eye: No discharge.     Extraocular Movements: Extraocular movements intact.     Conjunctiva/sclera: Conjunctivae normal.     Pupils: Pupils are equal, round, and reactive to light.  Cardiovascular:     Rate and Rhythm: Normal rate and regular rhythm.     Pulses: Normal pulses.     Heart sounds: Normal heart sounds. No murmur heard. No friction rub. No gallop.   Pulmonary:     Effort: Pulmonary effort is normal. No respiratory distress.     Breath sounds: Normal breath sounds. No stridor. No wheezing, rhonchi or rales.  Chest:     Chest wall: No tenderness.  Musculoskeletal:        General: Normal  range of motion.     Cervical back: Normal range of motion and neck supple.  Skin:    General: Skin is warm and dry.     Capillary Refill: Capillary refill takes less than 2 seconds.     Coloration: Skin is not jaundiced or pale.     Findings: No bruising, erythema, lesion or rash.  Neurological:     General: No focal deficit present.     Mental Status: She is alert and oriented to person, place, and time. Mental status is at baseline.  Psychiatric:        Mood and Affect: Mood normal.        Behavior: Behavior normal.        Thought Content: Thought content normal.        Judgment: Judgment normal.     Results for orders placed or performed in visit on 02/03/20  CBC with Differential/Platelet  Result Value Ref Range   WBC 6.1 3.4 - 10.8 x10E3/uL   RBC 4.33 3.77 - 5.28 x10E6/uL   Hemoglobin 13.5 11.1 - 15.9 g/dL   Hematocrit 40.2 34.0 - 46.6 %   MCV 93 79 - 97 fL   MCH 31.2 26.6 - 33.0 pg   MCHC 33.6 31.5 - 35.7 g/dL   RDW 12.9 11.7 - 15.4 %   Platelets 194 150 - 450 x10E3/uL   Neutrophils 53 Not Estab. %   Lymphs 38 Not Estab. %   Monocytes 6 Not Estab. %   Eos 2 Not Estab. %   Basos 1 Not Estab. %   Neutrophils Absolute 3.2 1.4 - 7.0 x10E3/uL   Lymphocytes Absolute 2.4 0.7 - 3.1 x10E3/uL   Monocytes Absolute 0.4 0.1 - 0.9 x10E3/uL   EOS (ABSOLUTE) 0.1 0.0 - 0.4 x10E3/uL   Basophils Absolute 0.1 0.0 - 0.2 x10E3/uL   Immature Granulocytes 0 Not Estab. %   Immature Grans (Abs) 0.0 0.0 - 0.1 x10E3/uL  Comprehensive metabolic panel  Result Value Ref Range   Glucose 93 65 - 99 mg/dL   BUN 13 8 - 27 mg/dL   Creatinine, Ser 1.02 (H) 0.57 - 1.00 mg/dL   GFR calc non Af Amer 55 (L) >59 mL/min/1.73   GFR calc Af Amer 63 >59 mL/min/1.73   BUN/Creatinine Ratio 13 12 - 28   Sodium 140 134 - 144 mmol/L   Potassium 4.0 3.5 - 5.2 mmol/L   Chloride 103 96 - 106 mmol/L   CO2 22 20 - 29 mmol/L   Calcium 9.6 8.7 - 10.3 mg/dL   Total Protein 6.5 6.0 - 8.5 g/dL  Albumin 4.6 3.7 -  4.7 g/dL   Globulin, Total 1.9 1.5 - 4.5 g/dL   Albumin/Globulin Ratio 2.4 (H) 1.2 - 2.2   Bilirubin Total 0.3 0.0 - 1.2 mg/dL   Alkaline Phosphatase 96 44 - 121 IU/L   AST 13 0 - 40 IU/L   ALT 15 0 - 32 IU/L  VITAMIN D 25 Hydroxy (Vit-D Deficiency, Fractures)  Result Value Ref Range   Vit D, 25-Hydroxy 27.1 (L) 30.0 - 100.0 ng/mL  Urinalysis, Routine w reflex microscopic  Result Value Ref Range   Specific Gravity, UA 1.016 1.005 - 1.030   pH, UA 5.0 5.0 - 7.5   Color, UA Yellow Yellow   Appearance Ur Clear Clear   Leukocytes,UA Negative Negative   Protein,UA Negative Negative/Trace   Glucose, UA Negative Negative   Ketones, UA Negative Negative   RBC, UA Negative Negative   Bilirubin, UA Negative Negative   Urobilinogen, Ur 0.2 0.2 - 1.0 mg/dL   Nitrite, UA Negative Negative   Microscopic Examination Comment   TSH  Result Value Ref Range   TSH 1.650 0.450 - 4.500 uIU/mL  Lipid Panel With LDL/HDL Ratio  Result Value Ref Range   Cholesterol, Total 239 (H) 100 - 199 mg/dL   Triglycerides 124 0 - 149 mg/dL   HDL 48 >39 mg/dL   VLDL Cholesterol Cal 22 5 - 40 mg/dL   LDL Chol Calc (NIH) 169 (H) 0 - 99 mg/dL   LDL/HDL Ratio 3.5 (H) 0.0 - 3.2 ratio      Assessment & Plan:   Problem List Items Addressed This Visit      Other   Generalized anxiety disorder - Primary    Under good control on current regimen with current exacerbation. Continue current regimen. Continue to monitor. Call with any concerns. Refills given for 3 months. Follow up 3 months.        Relevant Medications   ALPRAZolam (XANAX) 0.5 MG tablet       Follow up plan: Return in about 3 months (around 09/11/2020).

## 2020-06-11 NOTE — Assessment & Plan Note (Signed)
Under good control on current regimen with current exacerbation. Continue current regimen. Continue to monitor. Call with any concerns. Refills given for 3 months. Follow up 3 months.

## 2020-07-26 ENCOUNTER — Other Ambulatory Visit: Payer: Self-pay | Admitting: Family Medicine

## 2020-07-26 NOTE — Telephone Encounter (Signed)
Requested Prescriptions  Pending Prescriptions Disp Refills  . albuterol (VENTOLIN HFA) 108 (90 Base) MCG/ACT inhaler [Pharmacy Med Name: ALBUTEROL HFA (VENTOLIN) INH] 18 each 6    Sig: TAKE 2 PUFFS BY MOUTH EVERY 6 HOURS AS NEEDED FOR WHEEZE OR SHORTNESS OF BREATH     Pulmonology:  Beta Agonists Failed - 07/26/2020  7:46 AM      Failed - One inhaler should last at least one month. If the patient is requesting refills earlier, contact the patient to check for uncontrolled symptoms.      Passed - Valid encounter within last 12 months    Recent Outpatient Visits          1 month ago Generalized anxiety disorder   [place], Megan P, DO   6 months ago Routine general medical examination at a health care facility   [facility], [place] P, DO   9 months ago Generalized anxiety disorder   [place], [place], DO   1 year ago Generalized anxiety disorder   [place], [place], DO   1 year ago Itching   [place], Barb Merino, DO      Future Appointments            In 1 month [place], Barb Merino, DO [employer], [facility]

## 2020-08-05 ENCOUNTER — Encounter: Payer: Self-pay | Admitting: Nurse Practitioner

## 2020-08-05 ENCOUNTER — Telehealth (INDEPENDENT_AMBULATORY_CARE_PROVIDER_SITE_OTHER): Payer: Managed Care, Other (non HMO) | Admitting: Nurse Practitioner

## 2020-08-05 DIAGNOSIS — J302 Other seasonal allergic rhinitis: Secondary | ICD-10-CM | POA: Diagnosis not present

## 2020-08-05 DIAGNOSIS — J449 Chronic obstructive pulmonary disease, unspecified: Secondary | ICD-10-CM

## 2020-08-05 MED ORDER — MONTELUKAST SODIUM 10 MG PO TABS: 10.0000 mg | ORAL_TABLET | Freq: Every day | ORAL | 3 refills | Status: DC

## 2020-08-05 MED ORDER — ALBUTEROL SULFATE (2.5 MG/3ML) 0.083% IN NEBU: 2.5000 mg | INHALATION_SOLUTION | Freq: Four times a day (QID) | RESPIRATORY_TRACT | 1 refills | Status: DC | PRN

## 2020-08-05 NOTE — Progress Notes (Signed)
There were no vitals taken for this visit.   Subjective:    Patient ID: Maureen [name], female    DOB: [birth year], 75 y.o.   MRN: [id number]  HPI: Maureen [name] is a 75 y.o. female  Chief Complaint  Patient presents with  . Cough    Two negative covid test over the last 5 days. Seems to come and go as the weather changes    UPPER RESPIRATORY TRACT INFECTION Worst symptom: Patient states that she has been having cough and congestion.  Patient states that she has had two negative COVID tests.  Patient states every time the weather changes between hot and cold her cough and congestion gets worse.  Fever: no Cough: yes Shortness of breath: no Wheezing: no Chest pain: yes, with cough Chest tightness: no Chest congestion: no Nasal congestion: yes Runny nose: yes Post nasal drip: no Sneezing: no Sore throat: no Swollen glands: no Sinus pressure: no Headache: no Face pain: no Toothache: no Ear pain: no bilateral Ear pressure: no bilateral Eyes red/itching:no Eye drainage/crusting: no  Vomiting: no Rash: no Fatigue: yes Sick contacts: no Strep contacts: no  Context: fluctuating Recurrent sinusitis: no Relief with OTC cold/cough medications: no  Treatments attempted: albuterol inhaler  Relevant past medical, surgical, family and social history reviewed and updated as indicated. Interim medical history since our last visit reviewed. Allergies and medications reviewed and updated.  Review of Systems  Constitutional: Positive for fatigue. Negative for fever.  HENT: Positive for congestion and rhinorrhea. Negative for dental problem, ear pain, postnasal drip, sinus pressure, sinus pain, sneezing and sore throat.   Respiratory: Positive for cough. Negative for shortness of breath and wheezing.   Cardiovascular: Negative for chest pain.  Gastrointestinal: Negative for vomiting.  Skin: Negative for rash.  Neurological: Negative for headaches.    Per HPI unless  specifically indicated above     Objective:    There were no vitals taken for this visit.  Wt Readings from Last 3 Encounters:  06/11/20 150 lb 9.6 oz (68.3 kg)  01/06/20 148 lb (67.1 kg)  10/15/19 147 lb (66.7 kg)    Physical Exam Vitals and nursing note reviewed.  Pulmonary:     Effort: Pulmonary effort is normal. No respiratory distress.  Neurological:     Mental Status: She is alert.  Psychiatric:        Mood and Affect: Mood normal.        Behavior: Behavior normal.        Thought Content: Thought content normal.        Judgment: Judgment normal.     Results for orders placed or performed in visit on 02/03/20  CBC with Differential/Platelet  Result Value Ref Range   WBC 6.1 3.4 - 10.8 x10E3/uL   RBC 4.33 3.77 - 5.28 x10E6/uL   Hemoglobin 13.5 11.1 - 15.9 g/dL   Hematocrit 40.2 34.0 - 46.6 %   MCV 93 79 - 97 fL   MCH 31.2 26.6 - 33.0 pg   MCHC 33.6 31.5 - 35.7 g/dL   RDW 12.9 11.7 - 15.4 %   Platelets 194 150 - 450 x10E3/uL   Neutrophils 53 Not Estab. %   Lymphs 38 Not Estab. %   Monocytes 6 Not Estab. %   Eos 2 Not Estab. %   Basos 1 Not Estab. %   Neutrophils Absolute 3.2 1.4 - 7.0 x10E3/uL   Lymphocytes Absolute 2.4 0.7 - 3.1 x10E3/uL   Monocytes Absolute 0.4 0.1 -  0.9 x10E3/uL   EOS (ABSOLUTE) 0.1 0.0 - 0.4 x10E3/uL   Basophils Absolute 0.1 0.0 - 0.2 x10E3/uL   Immature Granulocytes 0 Not Estab. %   Immature Grans (Abs) 0.0 0.0 - 0.1 x10E3/uL  Comprehensive metabolic panel  Result Value Ref Range   Glucose 93 65 - 99 mg/dL   BUN 13 8 - 27 mg/dL   Creatinine, Ser 1.02 (H) 0.57 - 1.00 mg/dL   GFR calc non Af Amer 55 (L) >59 mL/min/1.73   GFR calc Af Amer 63 >59 mL/min/1.73   BUN/Creatinine Ratio 13 12 - 28   Sodium 140 134 - 144 mmol/L   Potassium 4.0 3.5 - 5.2 mmol/L   Chloride 103 96 - 106 mmol/L   CO2 22 20 - 29 mmol/L   Calcium 9.6 8.7 - 10.3 mg/dL   Total Protein 6.5 6.0 - 8.5 g/dL   Albumin 4.6 3.7 - 4.7 g/dL   Globulin, Total 1.9 1.5 - 4.5  g/dL   Albumin/Globulin Ratio 2.4 (H) 1.2 - 2.2   Bilirubin Total 0.3 0.0 - 1.2 mg/dL   Alkaline Phosphatase 96 44 - 121 IU/L   AST 13 0 - 40 IU/L   ALT 15 0 - 32 IU/L  VITAMIN D 25 Hydroxy (Vit-D Deficiency, Fractures)  Result Value Ref Range   Vit D, 25-Hydroxy 27.1 (L) 30.0 - 100.0 ng/mL  Urinalysis, Routine w reflex microscopic  Result Value Ref Range   Specific Gravity, UA 1.016 1.005 - 1.030   pH, UA 5.0 5.0 - 7.5   Color, UA Yellow Yellow   Appearance Ur Clear Clear   Leukocytes,UA Negative Negative   Protein,UA Negative Negative/Trace   Glucose, UA Negative Negative   Ketones, UA Negative Negative   RBC, UA Negative Negative   Bilirubin, UA Negative Negative   Urobilinogen, Ur 0.2 0.2 - 1.0 mg/dL   Nitrite, UA Negative Negative   Microscopic Examination Comment   TSH  Result Value Ref Range   TSH 1.650 0.450 - 4.500 uIU/mL  Lipid Panel With LDL/HDL Ratio  Result Value Ref Range   Cholesterol, Total 239 (H) 100 - 199 mg/dL   Triglycerides 124 0 - 149 mg/dL   HDL 48 >39 mg/dL   VLDL Cholesterol Cal 22 5 - 40 mg/dL   LDL Chol Calc (NIH) 169 (H) 0 - 99 mg/dL   LDL/HDL Ratio 3.5 (H) 0.0 - 3.2 ratio      Assessment & Plan:   Problem List Items Addressed This Visit      Respiratory   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 Return to clinic in two weeks for lung check. Sent nebulizer for patient at her request.  Reviewed signs and symptoms to monitor for and when to seek higher level of care.       Relevant Medications   albuterol (PROVENTIL) (2.5 MG/3ML) 0.083% nebulizer solution   montelukast (SINGULAIR) 10 MG tablet    Other Visit Diagnoses    Seasonal allergies           Follow up plan: Return in about 2 weeks (around 08/19/2020) for lung check.    This visit was completed via MyChart due to the restrictions of the COVID-19 pandemic. All  issues as above were discussed and addressed. Physical exam was done as above through visual confirmation on MyChart. If it was felt that the patient should be evaluated in the office,  they were directed there. The patient verbally consented to this visit. 1. Location of the patient: Work 2. Location of the provider: Office 3. Those involved with this call:  ? Provider: Jon Billings, NP ? CMA: Tiffany Reel, CMA ? Front Desk/Registration: Levert Feinstein 4. Time spent on call: 15 minutes with patient via phone conference. More than 50% of this time was spent in counseling and coordination of care. 21 minutes total spent in review of patient's record and preparation of their chart.

## 2020-08-06 NOTE — Assessment & Plan Note (Signed)
Recommend using singulair, zyrtec and albuterol to help with symptoms. Return to clinic in two weeks for lung check. Sent nebulizer for patient at her request.  Reviewed signs and symptoms to monitor for and when to seek higher level of care.

## 2020-09-13 ENCOUNTER — Encounter: Payer: Self-pay | Admitting: Family Medicine

## 2020-09-13 ENCOUNTER — Ambulatory Visit: Payer: Managed Care, Other (non HMO) | Admitting: Family Medicine

## 2020-09-13 ENCOUNTER — Other Ambulatory Visit: Payer: Self-pay

## 2020-09-13 VITALS — BP 135/79 | HR 83 | Temp 97.9°F | Wt 151.6 lb

## 2020-09-13 DIAGNOSIS — F411 Generalized anxiety disorder: Secondary | ICD-10-CM | POA: Diagnosis not present

## 2020-09-13 DIAGNOSIS — J302 Other seasonal allergic rhinitis: Secondary | ICD-10-CM

## 2020-09-13 DIAGNOSIS — J449 Chronic obstructive pulmonary disease, unspecified: Secondary | ICD-10-CM | POA: Diagnosis not present

## 2020-09-13 DIAGNOSIS — I714 Abdominal aortic aneurysm, without rupture, unspecified: Secondary | ICD-10-CM

## 2020-09-13 MED ORDER — OMEPRAZOLE 20 MG PO CPDR: 20.0000 mg | DELAYED_RELEASE_CAPSULE | Freq: Every day | ORAL | 1 refills | Status: DC

## 2020-09-13 MED ORDER — INCRUSE ELLIPTA 62.5 MCG/INH IN AEPB: 1.0000 | INHALATION_SPRAY | Freq: Every day | RESPIRATORY_TRACT | 6 refills | Status: DC

## 2020-09-13 MED ORDER — ALPRAZOLAM 0.5 MG PO TABS: 0.5000 mg | ORAL_TABLET | Freq: Two times a day (BID) | ORAL | 2 refills | Status: AC | PRN

## 2020-09-13 NOTE — Progress Notes (Signed)
BP 135/79   Pulse 83   Temp 97.9 F (36.6 C) (Oral)   Wt 151 lb 9.6 oz (68.8 kg)   BMI 27.73 kg/m    Subjective:    Patient ID: Maureen [name], female    DOB: 10-27-1945, 75 y.o.   MRN: [id number]  HPI: Maureen [name] is a 75 y.o. female  Chief Complaint  Patient presents with   Anxiety    Patient returns back to office today for 3 month follow up,no changes were made at last office visit. Today patient reports good compliance, tolerance and symptom control on medication.    Medication Problem    Patient would like to discuss her prescription for singulair, patient states that she takes at bedtime and states that she has difficulty sleeping at night not knowing if shes asleep or awake when laying down.   ANXIETY/STRESS Duration: chronic Status:controlled Anxious mood: yes  Excessive worrying: yes Irritability: no  Sweating: no Nausea: no Palpitations:no Hyperventilation: no Panic attacks: no Agoraphobia: no  Obscessions/compulsions: no Depressed mood: no Depression screen [facility] 2/9 01/06/2020 10/05/2019 07/02/2019 02/27/2019 11/11/2018  Decreased Interest 2 2 0 0 0  Down, Depressed, Hopeless 2 2 1  0 1  PHQ - 2 Score 4 4 1  0 1  Altered sleeping 0 2 0 0 1  Tired, decreased energy 0 2 0 0 0  Change in appetite 0 2 1 0 2  Feeling bad or failure about yourself  0 0 0 0 0  Trouble concentrating 0 2 0 0 0  Moving slowly or fidgety/restless 0 2 1 0 0  Suicidal thoughts 0 0 0 0 0  PHQ-9 Score 4 14 3  0 4  Difficult doing work/chores Not difficult at all Somewhat difficult - Not difficult at all Somewhat difficult  Some recent data might be [name]   Anhedonia: no Weight changes: no Insomnia: yes hard to fall asleep  Hypersomnia: no Fatigue/loss of energy: yes Feelings of worthlessness: no Feelings of guilt: no Impaired concentration/indecisiveness: no Suicidal ideations: no  Crying spells: no Recent Stressors/Life Changes: no   Relationship problems: no   Family  stress: no     Financial stress: no    Job stress: no    Recent death/loss: no  COPD COPD status: controlled Satisfied with current treatment?: yes Oxygen use: no Dyspnea frequency: rarely Cough frequency: rarely Rescue inhaler frequency: very rarely   Limitation of activity: no Productive cough: no Pneumovax: Up to Date Influenza: Up to Date   Relevant past medical, surgical, family and social history reviewed and updated as indicated. Interim medical history since our last visit reviewed. Allergies and medications reviewed and updated.  Review of Systems  Constitutional: Negative.   Respiratory: Negative.    Cardiovascular: Negative.   Gastrointestinal: Negative.   Musculoskeletal: Negative.   Psychiatric/Behavioral: Negative.     Per HPI unless specifically indicated above     Objective:    BP 135/79   Pulse 83   Temp 97.9 F (36.6 C) (Oral)   Wt 151 lb 9.6 oz (68.8 kg)   BMI 27.73 kg/m   Wt Readings from Last 3 Encounters:  09/13/20 151 lb 9.6 oz (68.8 kg)  06/11/20 150 lb 9.6 oz (68.3 kg)  01/06/20 148 lb (67.1 kg)    Physical Exam Vitals and nursing note reviewed.  Constitutional:      General: She is not in acute distress.    Appearance: Normal appearance. She is not ill-appearing, toxic-appearing or diaphoretic.  HENT:  Head: Normocephalic and atraumatic.     Right Ear: External ear normal.     Left Ear: External ear normal.     Nose: Nose normal.     Mouth/Throat:     Mouth: Mucous membranes are moist.     Pharynx: Oropharynx is clear.  Eyes:     General: No scleral icterus.       Right eye: No discharge.        Left eye: No discharge.     Extraocular Movements: Extraocular movements intact.     Conjunctiva/sclera: Conjunctivae normal.     Pupils: Pupils are equal, round, and reactive to light.  Cardiovascular:     Rate and Rhythm: Normal rate and regular rhythm.     Pulses: Normal pulses.     Heart sounds: Normal heart sounds. No  murmur heard.   No friction rub. No gallop.  Pulmonary:     Effort: Pulmonary effort is normal. No respiratory distress.     Breath sounds: Normal breath sounds. No stridor. No wheezing, rhonchi or rales.  Chest:     Chest wall: No tenderness.  Musculoskeletal:        General: Normal range of motion.     Cervical back: Normal range of motion and neck supple.  Skin:    General: Skin is warm and dry.     Capillary Refill: Capillary refill takes less than 2 seconds.     Coloration: Skin is not jaundiced or pale.     Findings: No bruising, erythema, lesion or rash.  Neurological:     General: No focal deficit present.     Mental Status: She is alert and oriented to person, place, and time. Mental status is at baseline.  Psychiatric:        Mood and Affect: Mood normal.        Behavior: Behavior normal.        Thought Content: Thought content normal.        Judgment: Judgment normal.    Results for orders placed or performed in visit on 02/03/20  CBC with Differential/Platelet  Result Value Ref Range   WBC 6.1 3.4 - 10.8 x10E3/uL   RBC 4.33 3.77 - 5.28 x10E6/uL   Hemoglobin 13.5 11.1 - 15.9 g/dL   Hematocrit 40.2 34.0 - 46.6 %   MCV 93 79 - 97 fL   MCH 31.2 26.6 - 33.0 pg   MCHC 33.6 31.5 - 35.7 g/dL   RDW 12.9 11.7 - 15.4 %   Platelets 194 150 - 450 x10E3/uL   Neutrophils 53 Not Estab. %   Lymphs 38 Not Estab. %   Monocytes 6 Not Estab. %   Eos 2 Not Estab. %   Basos 1 Not Estab. %   Neutrophils Absolute 3.2 1.4 - 7.0 x10E3/uL   Lymphocytes Absolute 2.4 0.7 - 3.1 x10E3/uL   Monocytes Absolute 0.4 0.1 - 0.9 x10E3/uL   EOS (ABSOLUTE) 0.1 0.0 - 0.4 x10E3/uL   Basophils Absolute 0.1 0.0 - 0.2 x10E3/uL   Immature Granulocytes 0 Not Estab. %   Immature Grans (Abs) 0.0 0.0 - 0.1 x10E3/uL  Comprehensive metabolic panel  Result Value Ref Range   Glucose 93 65 - 99 mg/dL   BUN 13 8 - 27 mg/dL   Creatinine, Ser 1.02 (H) 0.57 - 1.00 mg/dL   GFR calc non Af Amer 55 (L) >59  mL/min/1.73   GFR calc Af Amer 63 >59 mL/min/1.73   BUN/Creatinine Ratio 13 12 - 28  Sodium 140 134 - 144 mmol/L   Potassium 4.0 3.5 - 5.2 mmol/L   Chloride 103 96 - 106 mmol/L   CO2 22 20 - 29 mmol/L   Calcium 9.6 8.7 - 10.3 mg/dL   Total Protein 6.5 6.0 - 8.5 g/dL   Albumin 4.6 3.7 - 4.7 g/dL   Globulin, Total 1.9 1.5 - 4.5 g/dL   Albumin/Globulin Ratio 2.4 (H) 1.2 - 2.2   Bilirubin Total 0.3 0.0 - 1.2 mg/dL   Alkaline Phosphatase 96 44 - 121 IU/L   AST 13 0 - 40 IU/L   ALT 15 0 - 32 IU/L  VITAMIN D 25 Hydroxy (Vit-D Deficiency, Fractures)  Result Value Ref Range   Vit D, 25-Hydroxy 27.1 (L) 30.0 - 100.0 ng/mL  Urinalysis, Routine w reflex microscopic  Result Value Ref Range   Specific Gravity, UA 1.016 1.005 - 1.030   pH, UA 5.0 5.0 - 7.5   Color, UA Yellow Yellow   Appearance Ur Clear Clear   Leukocytes,UA Negative Negative   Protein,UA Negative Negative/Trace   Glucose, UA Negative Negative   Ketones, UA Negative Negative   RBC, UA Negative Negative   Bilirubin, UA Negative Negative   Urobilinogen, Ur 0.2 0.2 - 1.0 mg/dL   Nitrite, UA Negative Negative   Microscopic Examination Comment   TSH  Result Value Ref Range   TSH 1.650 0.450 - 4.500 uIU/mL  Lipid Panel With LDL/HDL Ratio  Result Value Ref Range   Cholesterol, Total 239 (H) 100 - 199 mg/dL   Triglycerides 124 0 - 149 mg/dL   HDL 48 >39 mg/dL   VLDL Cholesterol Cal 22 5 - 40 mg/dL   LDL Chol Calc (NIH) 169 (H) 0 - 99 mg/dL   LDL/HDL Ratio 3.5 (H) 0.0 - 3.2 ratio      Assessment & Plan:   Problem List Items Addressed This Visit       Cardiovascular and Mediastinum   Abdominal aortic aneurysm (AAA) without rupture (HCC)    Stable. Will keep her BP and cholesterol under good control. Continue to monitor.          Respiratory   COPD (chronic obstructive pulmonary disease) with chronic bronchitis (HCC)    Under good control on current regimen. Continue current regimen. Continue to monitor. Call with  any concerns. Refills given.         Relevant Medications   umeclidinium bromide (INCRUSE ELLIPTA) 62.5 MCG/INH AEPB     Other   Generalized anxiety disorder - Primary    Under good control on current regimen. Continue current regimen. Continue to monitor. Call with any concerns. Refills given for 3 months- first due 10/06/20. Follow up 4 months.         Relevant Medications   ALPRAZolam (XANAX) 0.5 MG tablet (Start on 10/06/2020)   Other Visit Diagnoses     Seasonal allergies       Better. Will take her singuair in the AM. Call with any concerns.         Follow up plan: Return in about 4 months (around 01/13/2021).

## 2020-09-13 NOTE — Assessment & Plan Note (Signed)
Under good control on current regimen. Continue current regimen. Continue to monitor. Call with any concerns. Refills given.   

## 2020-09-13 NOTE — Assessment & Plan Note (Signed)
Under good control on current regimen. Continue current regimen. Continue to monitor. Call with any concerns. Refills given for 3 months- first due 10/06/20. Follow up 4 months.

## 2020-09-13 NOTE — Assessment & Plan Note (Signed)
Stable. Will keep her BP and cholesterol under good control. Continue to monitor.

## 2020-09-28 ENCOUNTER — Other Ambulatory Visit: Payer: Self-pay | Admitting: General Surgery

## 2020-09-28 DIAGNOSIS — I714 Abdominal aortic aneurysm, without rupture, unspecified: Secondary | ICD-10-CM

## 2020-10-13 ENCOUNTER — Other Ambulatory Visit: Payer: Self-pay | Admitting: Family Medicine

## 2020-10-13 NOTE — Telephone Encounter (Signed)
Requested medications are due for refill today.  Seems too soon  Requested medications are on the active medications list.  yes  Last refill. 10/06/2020  Future visit scheduled.   yes  Notes to clinic.  Medication not delegated.

## 2020-10-15 ENCOUNTER — Encounter: Payer: Self-pay | Admitting: Family Medicine

## 2020-10-18 ENCOUNTER — Ambulatory Visit
Admission: RE | Admit: 2020-10-18 | Discharge: 2020-10-18 | Disposition: A | Discharge status: Home / Self Care | Payer: Managed Care, Other (non HMO) | Source: Ambulatory Visit | Attending: General Surgery | Admitting: General Surgery

## 2020-10-18 ENCOUNTER — Other Ambulatory Visit: Payer: Self-pay

## 2020-10-18 DIAGNOSIS — I714 Abdominal aortic aneurysm, without rupture, unspecified: Secondary | ICD-10-CM

## 2020-10-19 ENCOUNTER — Other Ambulatory Visit: Payer: Self-pay | Admitting: General Surgery

## 2020-10-19 DIAGNOSIS — I714 Abdominal aortic aneurysm, without rupture, unspecified: Secondary | ICD-10-CM

## 2020-10-22 ENCOUNTER — Other Ambulatory Visit: Payer: Self-pay | Admitting: Family Medicine

## 2020-10-22 DIAGNOSIS — I70219 Atherosclerosis of native arteries of extremities with intermittent claudication, unspecified extremity: Secondary | ICD-10-CM | POA: Insufficient documentation

## 2020-10-22 DIAGNOSIS — I714 Abdominal aortic aneurysm, without rupture, unspecified: Secondary | ICD-10-CM

## 2020-10-22 DIAGNOSIS — I700 Atherosclerosis of aorta: Secondary | ICD-10-CM | POA: Insufficient documentation

## 2020-10-31 ENCOUNTER — Other Ambulatory Visit: Payer: Self-pay | Admitting: Nurse Practitioner

## 2020-10-31 NOTE — Telephone Encounter (Signed)
Requested Prescriptions  Pending Prescriptions Disp Refills  . montelukast (SINGULAIR) 10 MG tablet [Pharmacy Med Name: MONTELUKAST SOD 10 MG TABLET] 90 tablet 1    Sig: TAKE 1 TABLET BY MOUTH EVERYDAY AT BEDTIME     Pulmonology:  Leukotriene Inhibitors Passed - 10/31/2020 12:52 AM      Passed - Valid encounter within last 12 months    Recent Outpatient Visits          1 month ago Generalized anxiety disorder   [place], Megan P, DO   2 months ago COPD (chronic obstructive pulmonary disease) with chronic bronchitis ([place])   [place], NP   4 months ago Generalized anxiety disorder   [employer], Megan P, DO   9 months ago Routine general medical examination at a health care facility   [place], [place], DO   1 year ago Generalized anxiety disorder   [place], [place], DO

## 2020-11-11 ENCOUNTER — Ambulatory Visit (INDEPENDENT_AMBULATORY_CARE_PROVIDER_SITE_OTHER): Payer: Managed Care, Other (non HMO) | Admitting: Vascular Surgery

## 2020-11-11 ENCOUNTER — Encounter (INDEPENDENT_AMBULATORY_CARE_PROVIDER_SITE_OTHER): Payer: Self-pay | Admitting: Vascular Surgery

## 2020-11-11 ENCOUNTER — Other Ambulatory Visit: Payer: Self-pay

## 2020-11-11 VITALS — BP 133/64 | HR 85 | Resp 16 | Ht 63.5 in | Wt 148.6 lb

## 2020-11-11 DIAGNOSIS — K219 Gastro-esophageal reflux disease without esophagitis: Secondary | ICD-10-CM

## 2020-11-11 DIAGNOSIS — E782 Mixed hyperlipidemia: Secondary | ICD-10-CM

## 2020-11-11 DIAGNOSIS — I700 Atherosclerosis of aorta: Secondary | ICD-10-CM | POA: Diagnosis not present

## 2020-11-11 DIAGNOSIS — J449 Chronic obstructive pulmonary disease, unspecified: Secondary | ICD-10-CM

## 2020-11-11 DIAGNOSIS — I714 Abdominal aortic aneurysm, without rupture, unspecified: Secondary | ICD-10-CM

## 2020-11-11 DIAGNOSIS — I708 Atherosclerosis of other arteries: Secondary | ICD-10-CM

## 2020-11-11 NOTE — Progress Notes (Signed)
MRN : [id number]  Maureen [name] is a 75 y.o. (1946-02-02) female who presents with chief complaint of AAA.  History of Present Illness:   The patient presents to the office for evaluation of an abdominal aortic aneurysm. The aneurysm was found incidentally by CT scan of the chest that was ordered to evaluate a lung nodule.  However, since this was a study focusing on the chest the abdominal aortic aneurysm was incompletely characterized.  Subsequently duplex ultrasound of the abdominal aorta was obtained to more completely characterize the aneurysm.  The infrarenal aortic aneurysm measures 4.1 cm patient denies abdominal pain or unusual back pain, no other abdominal complaints.  No history of an acute onset of painful blue discoloration of the toes.     No family history of AAA.   There is a history of claudication but no rest pain symptoms of the lower extremities.  No prior interventions.  No wounds or sores  Patient denies amaurosis fugax or TIA symptoms.   The patient denies angina or shortness of breath.    No outpatient medications have been marked as taking for the 11/11/20 encounter (Appointment) with Delana Meyer, Dolores Lory, MD.    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 This was a histologic grade 2 tumor measuring just under 1 cm in diameter. No invasive cancer was identified. ER 90%, PR 40%.   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   Discoid lupus 2005   GERD (gastroesophageal reflux disease)    Heart murmur 2005   Hypercholesterolemia 2012   Mammographic microcalcification 2011   Panic attack    Personal history of chemotherapy 1994   Personal history of colonic polyps    Colon polyps   Personal history of tobacco use, presenting hazards to  health    PUD (peptic ulcer disease)    Last EGD 1/09   Rectal polyp 2008   villous adenoma; last colonoscopy 1/09 WNL   Skin cancer, basal cell 2015   right cheek Mohs surgery at [place]     Past Surgical History:  Procedure Laterality Date   APPENDECTOMY  2011   BREAST BIOPSY  12/27/2009   right breast   CARDIAC CATHETERIZATION  1996   Negative Cath   COLONOSCOPY N/A 08/05/2014   Procedure: COLONOSCOPY;  Surgeon: [name], MD;  Location: [facility] ENDOSCOPY;  EGD, tubular adenoma 1.   COLONOSCOPY W/ BIOPSIES  2005,2009   Dr. Bary Castilla. Normal exam 2009. Villous adenoma of the rectum in 2005 adenomatous polyp from the ascending colon and rectum in 2002.   ESOPHAGOGASTRODUODENOSCOPY N/A 08/05/2014   Procedure: ESOPHAGOGASTRODUODENOSCOPY (EGD);  Surgeon: [name], MD;  Location: [facility] ENDOSCOPY;  Service: Endoscopy;  Laterality: N/A;   ESOPHAGOGASTRODUODENOSCOPY (EGD) WITH PROPOFOL N/A 07/25/2017   Procedure: ESOPHAGOGASTRODUODENOSCOPY (EGD) WITH PROPOFOL;  Surgeon: [name], MD;  Location: [facility] ENDOSCOPY;  Service: Endoscopy;  Laterality: N/A;   MASTECTOMY     bilateral   SIMPLE MASTECTOMY  1993   right   SIMPLE MASTECTOMY  2011   left   TONSILLECTOMY  1997   UPPER GI ENDOSCOPY  2003, 2005, 2009, 2012, 2014   2012 showed mild reflux changes without evidence of Barrett's epithelial changes 2014 biopsies at 37 cm showed changes suggestive  of reflux esophagitis. No metaplasia.   VAGINAL HYSTERECTOMY  1977    Social History Social History   Tobacco Use   Smoking status: Every Day    Packs/day: 0.50    Years: 40.00    Pack years: 20.00    Types: Cigarettes   Smokeless tobacco: Never   Tobacco comments:    Pt wants to do hypnotizing  Vaping Use   Vaping Use: Never used  Substance Use Topics   Alcohol use: Yes    Alcohol/week: 1.0 - 2.0 standard drink    Types: 1 - 2 Standard drinks or equivalent per week   Drug use: No    Family  History Family History  Problem Relation Age of Onset   Cancer Father        esophagus   Cancer Maternal Aunt        breast cancer   Cancer Maternal Grandmother        breast cancer    Allergies  Allergen Reactions   Codeine Itching   Sulfa Antibiotics Hives   Sulfinpyrazone    Erythromycin Rash   Penicillins Rash     REVIEW OF SYSTEMS (Negative unless checked)  Constitutional: [] Weight loss  [] Fever  [] Chills Cardiac: [] Chest pain   [] Chest pressure   [] Palpitations   [] Shortness of breath when laying flat   [] Shortness of breath with exertion. Vascular:  [] Pain in legs with walking   [] Pain in legs at rest  [] History of DVT   [] Phlebitis   [] Swelling in legs   [] Varicose veins   [] Non-healing ulcers Pulmonary:   [] Uses home oxygen   [] Productive cough   [] Hemoptysis   [] Wheeze  [] COPD   [] Asthma Neurologic:  [] Dizziness   [] Seizures   [] History of stroke   [] History of TIA  [] Aphasia   [] Vissual changes   [] Weakness or numbness in arm   [] Weakness or numbness in leg Musculoskeletal:   [] Joint swelling   [] Joint pain   [] Low back pain Hematologic:  [] Easy bruising  [] Easy bleeding   [] Hypercoagulable state   [] Anemic Gastrointestinal:  [] Diarrhea   [] Vomiting  [x] Gastroesophageal reflux/heartburn   [] Difficulty swallowing. Genitourinary:  [] Chronic kidney disease   [] Difficult urination  [] Frequent urination   [] Blood in urine Skin:  [] Rashes   [] Ulcers  Psychological:  [] History of anxiety   []  History of major depression.  Physical Examination  There were no vitals filed for this visit. There is no height or weight on file to calculate BMI. Gen: WD/WN, NAD Head: [place]/AT, No temporalis wasting.  Ear/Nose/Throat: Hearing grossly intact, nares w/o erythema or drainage Eyes: PER, EOMI, sclera nonicteric.  Neck: Supple, no masses.  No bruit or JVD.  Pulmonary:  Good air movement, no audible wheezing, no use of accessory muscles.  Cardiac: RRR, normal S1, S2, no  Murmurs. Vascular:   No wounds or sores of the feet and ankles; no carotid bruits Vessel Right Left  Radial Palpable Palpable  Carotid Palpable Palpable  PT Not Palpable Not Palpable  DP Not Palpable Not Palpable  Gastrointestinal: soft, non-distended. No guarding/no peritoneal signs.  Musculoskeletal: M/S 5/5 throughout.  No visible deformity.  Neurologic: CN 2-12 intact. Pain and light touch intact in extremities.  Symmetrical.  Speech is fluent. Motor exam as listed above. Psychiatric: Judgment intact, Mood & affect appropriate for pt's clinical situation. Dermatologic: No rashes or ulcers noted.  No changes consistent with cellulitis.   CBC Lab Results  Component Value Date   WBC 6.1 02/03/2020  HGB 13.5 02/03/2020   HCT 40.2 02/03/2020   MCV 93 02/03/2020   PLT 194 02/03/2020    BMET    Component Value Date/Time   NA 140 02/03/2020 0859   K 4.0 02/03/2020 0859   CL 103 02/03/2020 0859   CO2 22 02/03/2020 0859   GLUCOSE 93 02/03/2020 0859   GLUCOSE 95 12/10/2014 1616   BUN 13 02/03/2020 0859   CREATININE 1.02 (H) 02/03/2020 0859   CALCIUM 9.6 02/03/2020 0859   GFRNONAA 55 (L) 02/03/2020 0859   GFRAA 63 02/03/2020 0859   CrCl cannot be calculated (Patient's most recent lab result is older than the maximum 21 days allowed.).  COAG No results found for: INR, PROTIME  Radiology US AORTA DUPLEX LIMITED  Result Date: 10/19/2020 CLINICAL DATA:  Detection of roughly 3.9 cm abdominal aortic aneurysm by screening ultrasound in 2020. Follow-up ultrasound performed. EXAM: ULTRASOUND OF ABDOMINAL AORTA TECHNIQUE: Ultrasound examination of the abdominal aorta and proximal common iliac arteries was performed to evaluate for aneurysm. Additional color and Doppler images of the distal aorta were obtained to document patency. COMPARISON:  Prior aortic ultrasound on 10/01/2018 FINDINGS: Abdominal aortic measurements as follows: Proximal:  2.2 x 2.5 cm Mid:  3.6 x 4.3 cm Distal:  4.1 x  4.2 cm Patent: Yes, peak systolic velocity is 161 cm/sec. This measured velocity is very distal an may actually be sampling the origin of a common iliac artery. Right common iliac artery: 1.0 cm Left common iliac artery: 0.8 cm Potential decreased/poor flow was noted in the left common iliac artery but visualization was limited. Occlusive disease of the left iliac arteries cannot be excluded. IMPRESSION: 1. Probable mild interval enlargement of abdominal aortic aneurysm from maximal diameter of 3.9 cm to roughly 4.3 cm by ultrasound. 2. Potential significant occlusive disease of the left common iliac artery with potential poor flow. Evaluation by ultrasound is very limited. Correlation suggested with any symptoms of left lower extremity claudication or left buttock claudication. 3. Given enlargement of measurement of the abdominal aortic aneurysm to greater than 4 cm as well as potential significant occlusive disease of the left iliac arteries, correlation with CT angiography of the abdomen and pelvis with contrast may be helpful to more accurately measure the aneurysm and depict patency of the iliac arteries. 4. Recommend follow-up every 12 months and vascular consultation. This recommendation follows ACR consensus guidelines: White Paper of the ACR Incidental Findings Committee II on Vascular Findings. J Am Coll Radiol 2013; 10:789-794. Electronically Signed   By: Aletta Edouard M.D.   On: 10/19/2020 10:10     Assessment/Plan 1. Abdominal aortic aneurysm (AAA) without rupture ([place]) No surgery or intervention at this time. The patient has an asymptomatic abdominal aortic aneurysm that is greater than 4 cm but less than 5 cm in maximal diameter.  I have discussed the natural history of abdominal aortic aneurysm and the small risk of rupture for aneurysm less than 5 cm in size.  However, as these small aneurysms tend to enlarge over time, continued surveillance with ultrasound or CT scan is mandatory.  I have  also discussed optimizing medical management with hypertension and lipid control and the importance of abstinence from tobacco.  The patient is also encouraged to exercise a minimum of 30 minutes 4 times a week.  Should the patient develop new onset abdominal or back pain or signs of peripheral embolization they are instructed to seek medical attention immediately and to alert the physician providing care that they  have an aneurysm.  The patient voices their understanding. I have scheduled the patient to return in 6 months with an aortic duplex.  - VAS US AORTA/IVC/ILIACS; Future  2. 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  The patient does not voice lifestyle limiting changes at this point in time.  Noninvasive studies do not suggest clinically significant change.  No invasive studies, angiography or surgery at this time The patient should continue walking and begin a more formal exercise program.  The patient should continue antiplatelet therapy and aggressive treatment of the lipid abnormalities  No changes in the patient's medications at this time  The patient should continue wearing graduated compression socks 10-15 mmHg strength to control the mild edema.   - VAS [place] ABI WITH/WO TBI; Future  3. COPD (chronic obstructive pulmonary disease) with chronic bronchitis (HCC) Continue pulmonary medications and aerosols as already ordered, these medications have been reviewed and there are no changes at this time.    4. Mixed hyperlipidemia Continue statin as ordered and reviewed, no changes at this time   5. Gastroesophageal reflux disease, unspecified whether esophagitis present Continue PPI as already ordered, this medication has been reviewed and there are no changes at this time.  Avoidence of caffeine and alcohol  Moderate elevation of the head of the  bed      Hortencia Pilar, MD  11/11/2020 8:54 AM

## 2020-11-22 ENCOUNTER — Encounter (INDEPENDENT_AMBULATORY_CARE_PROVIDER_SITE_OTHER): Payer: Self-pay | Admitting: Vascular Surgery

## 2021-01-13 ENCOUNTER — Encounter: Payer: Managed Care, Other (non HMO) | Admitting: Family Medicine

## 2021-01-19 ENCOUNTER — Encounter: Payer: Self-pay | Admitting: Family Medicine

## 2021-01-19 ENCOUNTER — Other Ambulatory Visit: Payer: Self-pay

## 2021-01-19 ENCOUNTER — Ambulatory Visit (INDEPENDENT_AMBULATORY_CARE_PROVIDER_SITE_OTHER): Payer: Managed Care, Other (non HMO) | Admitting: Family Medicine

## 2021-01-19 VITALS — BP 124/71 | HR 67 | Temp 97.8°F | Ht 64.0 in | Wt 153.2 lb

## 2021-01-19 DIAGNOSIS — I700 Atherosclerosis of aorta: Secondary | ICD-10-CM

## 2021-01-19 DIAGNOSIS — E559 Vitamin D deficiency, unspecified: Secondary | ICD-10-CM

## 2021-01-19 DIAGNOSIS — IMO0002 Reserved for concepts with insufficient information to code with codable children: Secondary | ICD-10-CM

## 2021-01-19 DIAGNOSIS — J449 Chronic obstructive pulmonary disease, unspecified: Secondary | ICD-10-CM

## 2021-01-19 DIAGNOSIS — I708 Atherosclerosis of other arteries: Secondary | ICD-10-CM

## 2021-01-19 DIAGNOSIS — Z0000 Encounter for general adult medical examination without abnormal findings: Secondary | ICD-10-CM | POA: Diagnosis not present

## 2021-01-19 DIAGNOSIS — M329 Systemic lupus erythematosus, unspecified: Secondary | ICD-10-CM | POA: Diagnosis not present

## 2021-01-19 DIAGNOSIS — E782 Mixed hyperlipidemia: Secondary | ICD-10-CM

## 2021-01-19 DIAGNOSIS — Z23 Encounter for immunization: Secondary | ICD-10-CM

## 2021-01-19 DIAGNOSIS — F411 Generalized anxiety disorder: Secondary | ICD-10-CM

## 2021-01-19 DIAGNOSIS — J4489 Other specified chronic obstructive pulmonary disease: Secondary | ICD-10-CM

## 2021-01-19 MED ORDER — ALBUTEROL SULFATE HFA 108 (90 BASE) MCG/ACT IN AERS: 1.0000 | INHALATION_SPRAY | Freq: Four times a day (QID) | RESPIRATORY_TRACT | 6 refills | Status: DC | PRN

## 2021-01-19 MED ORDER — OMEPRAZOLE 20 MG PO CPDR: 20.0000 mg | DELAYED_RELEASE_CAPSULE | Freq: Every day | ORAL | 1 refills | Status: DC

## 2021-01-19 MED ORDER — UMECLIDINIUM BROMIDE 62.5 MCG/ACT IN AEPB: 1.0000 | INHALATION_SPRAY | Freq: Every day | RESPIRATORY_TRACT | 6 refills | Status: DC

## 2021-01-19 MED ORDER — ALPRAZOLAM 0.5 MG PO TABS: 0.2500 mg | ORAL_TABLET | Freq: Two times a day (BID) | ORAL | 2 refills | Status: DC | PRN

## 2021-01-19 MED ORDER — MONTELUKAST SODIUM 10 MG PO TABS: ORAL_TABLET | ORAL | 1 refills | Status: DC

## 2021-01-19 NOTE — Progress Notes (Signed)
BP 124/71   Pulse 67   Temp 97.8 F (36.6 C)   Ht 5\' 4"  (1.626 m)   Wt 153 lb 3.2 oz (69.5 kg)   SpO2 98%   BMI 26.30 kg/m    Subjective:    Patient ID: Maureen [name], female    DOB: [birth year], 75 y.o.   MRN: [id number]  HPI: Maureen [name] is a 75 y.o. female presenting on 01/19/2021 for comprehensive medical examination. Current medical complaints include:  ANXIETY/STRESS Duration: chronic Status:stable Anxious mood: no  Excessive worrying: no Irritability: no  Sweating: no Nausea: no Palpitations:no Hyperventilation: no Panic attacks: no Agoraphobia: no  Obscessions/compulsions: no Depressed mood: no Depression screen [facility] 2/9 01/19/2021 01/06/2020 10/05/2019 07/02/2019 02/27/2019  Decreased Interest 0 2 2 0 0  Down, Depressed, Hopeless 0 2 2 1  0  PHQ - 2 Score 0 4 4 1  0  Altered sleeping 0 0 2 0 0  Tired, decreased energy 0 0 2 0 0  Change in appetite 1 0 2 1 0  Feeling bad or failure about yourself  0 0 0 0 0  Trouble concentrating 0 0 2 0 0  Moving slowly or fidgety/restless 0 0 2 1 0  Suicidal thoughts 0 0 0 0 0  PHQ-9 Score 1 4 14 3  0  Difficult doing work/chores Not difficult at all Not difficult at all Somewhat difficult - Not difficult at all  Some recent data might be [name]   Anhedonia: no Weight changes: no Insomnia: no   Hypersomnia: no Fatigue/loss of energy: no Feelings of worthlessness: no Feelings of guilt: no Impaired concentration/indecisiveness: no Suicidal ideations: no  Crying spells: no Recent Stressors/Life Changes: no   Relationship problems: no   Family stress: no     Financial stress: no    Job stress: no    Recent death/loss: no  HYPERLIPIDEMIA Hyperlipidemia status: excellent compliance Satisfied with current treatment?  yes Side effects:  no Medication compliance: good compliance Past cholesterol meds: none Supplements: fish oil Aspirin:  no The 10-year ASCVD risk score (Arnett DK, et al., 2019) is: 20.3%    Values used to calculate the score:     Age: 66 years     Sex: Female     Is Non-Hispanic African American: No     Diabetic: No     Tobacco smoker: Yes     Systolic Blood Pressure: 166 mmHg     Is BP treated: No     HDL Cholesterol: 48 mg/dL     Total Cholesterol: 239 mg/dL Chest pain:  no  She currently lives with: grandson Menopausal Symptoms: no  Depression Screen done today and results listed below:  Depression screen [facility] 2/9 01/19/2021 01/06/2020 10/05/2019 07/02/2019 02/27/2019  Decreased Interest 0 2 2 0 0  Down, Depressed, Hopeless 0 2 2 1  0  PHQ - 2 Score 0 4 4 1  0  Altered sleeping 0 0 2 0 0  Tired, decreased energy 0 0 2 0 0  Change in appetite 1 0 2 1 0  Feeling bad or failure about yourself  0 0 0 0 0  Trouble concentrating 0 0 2 0 0  Moving slowly or fidgety/restless 0 0 2 1 0  Suicidal thoughts 0 0 0 0 0  PHQ-9 Score 1 4 14 3  0  Difficult doing work/chores Not difficult at all Not difficult at all Somewhat difficult - Not difficult at all  Some recent data might be [name]   Past  Medical History:  Past Medical History:  Diagnosis Date   Arthritis 2005   Breast cancer, left breast ([place]) 2011   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 This was a histologic grade 2 tumor measuring just under 1 cm in diameter. No invasive cancer was identified. ER 90%, PR 40%.   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   Discoid lupus 2005   GERD (gastroesophageal reflux disease)    Heart murmur 2005   Hypercholesterolemia 2012   Mammographic microcalcification 2011   Panic attack    Personal history of chemotherapy 1994   Personal history of colonic polyps    Colon polyps   Personal history of tobacco use, presenting hazards to health    PUD (peptic ulcer disease)    Last EGD 1/09   Rectal polyp 2008   villous adenoma; last colonoscopy 1/09 WNL    Skin cancer, basal cell 2015   right cheek Mohs surgery at [facility]     Surgical History:  Past Surgical History:  Procedure Laterality Date   APPENDECTOMY  2011   BREAST BIOPSY  12/27/2009   right breast   CARDIAC CATHETERIZATION  1996   Negative Cath   COLONOSCOPY N/A 08/05/2014   Procedure: COLONOSCOPY;  Surgeon: [name], MD;  Location: [facility] ENDOSCOPY;  EGD, tubular adenoma 1.   COLONOSCOPY W/ BIOPSIES  2005,2009   Dr. Bary Castilla. Normal exam 2009. Villous adenoma of the rectum in 2005 adenomatous polyp from the ascending colon and rectum in 2002.   ESOPHAGOGASTRODUODENOSCOPY N/A 08/05/2014   Procedure: ESOPHAGOGASTRODUODENOSCOPY (EGD);  Surgeon: [name], MD;  Location: [facility] ENDOSCOPY;  Service: Endoscopy;  Laterality: N/A;   ESOPHAGOGASTRODUODENOSCOPY (EGD) WITH PROPOFOL N/A 07/25/2017   Procedure: ESOPHAGOGASTRODUODENOSCOPY (EGD) WITH PROPOFOL;  Surgeon: [name], MD;  Location: [facility] ENDOSCOPY;  Service: Endoscopy;  Laterality: N/A;   MASTECTOMY     bilateral   SIMPLE MASTECTOMY  1993   right   SIMPLE MASTECTOMY  2011   left   TONSILLECTOMY  1997   UPPER GI ENDOSCOPY  2003, 2005, 2009, 2012, 2014   2012 showed mild reflux changes without evidence of Barrett's epithelial changes 2014 biopsies at 37 cm showed changes suggestive of reflux esophagitis. No metaplasia.   VAGINAL HYSTERECTOMY  1977    Medications:  Current Outpatient Medications on File Prior to Visit  Medication Sig   acetaminophen (TYLENOL) 500 MG tablet Take 500 mg by mouth every 6 (six) hours as needed for headache.   albuterol (PROVENTIL) (2.5 MG/3ML) 0.083% nebulizer solution Take 3 mLs (2.5 mg total) by nebulization every 6 (six) hours as needed for wheezing or shortness of breath.   Ascorbic Acid (VITAMIN C) 100 MG tablet Take 100 mg by mouth daily.   Calcium-Vitamins C & D (CALCIUM/C/D PO) Take by mouth daily.   Cholecalciferol (VITAMIN D3) 50 MCG (2000 UT) TABS  Take by mouth.   clobetasol cream (TEMOVATE) 3.97 % Apply 1 application topically daily.   Homeopathic Products (ZICAM ALLERGY RELIEF NA) Place 2 sprays into the nose daily as needed (allergies).    KRILL OIL PO Take by mouth daily.   Multiple Vitamin (MULTIVITAMIN PO) Take by mouth daily.   vitamin E 100 UNIT capsule Take by mouth daily.   zinc gluconate 50 MG tablet Take 50 mg by mouth every other day.   No current facility-administered medications on  file prior to visit.    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 She has two adult daughters. She lives in her home. Her youngest daughter and grandchildren live with her. She has two dogs. 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 She loves watching her grandson play basketball. She also loves gardening and being outdoors and working in the yard.    Social Determinants of Health   Financial Resource Strain: Not on file   Food Insecurity: Not on file  Transportation Needs: Not on file  Physical Activity: Not on file  Stress: Not on file  Social Connections: Not on file  Intimate Partner Violence: Not on file   Social History   Tobacco Use  Smoking Status Every Day   Packs/day: 0.50   Years: 40.00   Pack years: 20.00   Types: Cigarettes  Smokeless Tobacco Never  Tobacco Comments   Pt wants to do hypnotizing   Social History   Substance and Sexual Activity  Alcohol Use Yes   Alcohol/week: 1.0 - 2.0 standard drink   Types: 1 - 2 Standard drinks or equivalent per week    Family History:  Family History  Problem Relation Age of Onset   Cancer Father        esophagus   Cancer Maternal Aunt        breast cancer   Cancer Maternal Grandmother        breast cancer    Past medical history, surgical history, medications, allergies, family history and social history reviewed with patient today and changes made to appropriate areas of the chart.   Review of Systems  Constitutional: Negative.   HENT: Negative.    Eyes: Negative.   Respiratory: Negative.    Cardiovascular: Negative.   Gastrointestinal: Negative.   Genitourinary: Negative.   Musculoskeletal: Negative.   Neurological: Negative.   Endo/Heme/Allergies: Negative.   Psychiatric/Behavioral:  Negative for depression, hallucinations, memory loss, substance abuse and suicidal ideas. The patient is nervous/anxious. The patient does not have insomnia.   All other ROS negative except what is listed above and in the HPI.      Objective:    BP 124/71   Pulse 67   Temp 97.8 F (36.6 C)  Ht 5\' 4"  (1.626 m)   Wt 153 lb 3.2 oz (69.5 kg)   SpO2 98%   BMI 26.30 kg/m   Wt Readings from Last 3 Encounters:  01/19/21 153 lb 3.2 oz (69.5 kg)  11/11/20 148 lb 9.6 oz (67.4 kg)  09/13/20 151 lb 9.6 oz (68.8 kg)    Physical Exam Vitals and nursing note reviewed.  Constitutional:      General: She is not in acute distress.     Appearance: Normal appearance. She is not ill-appearing, toxic-appearing or diaphoretic.  HENT:     Head: Normocephalic and atraumatic.     Right Ear: Tympanic membrane, ear canal and external ear normal. There is no impacted cerumen.     Left Ear: Tympanic membrane, ear canal and external ear normal. There is no impacted cerumen.     Nose: Nose normal. No congestion or rhinorrhea.     Mouth/Throat:     Mouth: Mucous membranes are moist.     Pharynx: Oropharynx is clear. No oropharyngeal exudate or posterior oropharyngeal erythema.  Eyes:     General: No scleral icterus.       Right eye: No discharge.        Left eye: No discharge.     Extraocular Movements: Extraocular movements intact.     Conjunctiva/sclera: Conjunctivae normal.     Pupils: Pupils are equal, round, and reactive to light.  Neck:     Vascular: No carotid bruit.  Cardiovascular:     Rate and Rhythm: Normal rate and regular rhythm.     Pulses: Normal pulses.     Heart sounds: No murmur heard.   No friction rub. No gallop.  Pulmonary:     Effort: Pulmonary effort is normal. No respiratory distress.     Breath sounds: Normal breath sounds. No stridor. No wheezing, rhonchi or rales.  Chest:     Chest wall: No tenderness.  Abdominal:     General: Abdomen is flat. Bowel sounds are normal. There is no distension.     Palpations: Abdomen is soft. There is no mass.     Tenderness: There is no abdominal tenderness. There is no right CVA tenderness, left CVA tenderness, guarding or rebound.     Hernia: No hernia is present.  Genitourinary:    Comments: Breast and pelvic exams deferred with shared decision making Musculoskeletal:        General: No swelling, tenderness, deformity or signs of injury.     Cervical back: Normal range of motion and neck supple. No rigidity. No muscular tenderness.     Right lower leg: No edema.     Left lower leg: No edema.  Lymphadenopathy:     Cervical: No cervical adenopathy.  Skin:     General: Skin is warm and dry.     Capillary Refill: Capillary refill takes less than 2 seconds.     Coloration: Skin is not jaundiced or pale.     Findings: No bruising, erythema, lesion or rash.  Neurological:     General: No focal deficit present.     Mental Status: She is alert and oriented to person, place, and time. Mental status is at baseline.     Cranial Nerves: No cranial nerve deficit.     Sensory: No sensory deficit.     Motor: No weakness.     Coordination: Coordination normal.     Gait: Gait normal.     Deep Tendon Reflexes: Reflexes normal.  Psychiatric:  Mood and Affect: Mood normal.        Behavior: Behavior normal.        Thought Content: Thought content normal.        Judgment: Judgment normal.    Results for orders placed or performed in visit on 02/03/20  CBC with Differential/Platelet  Result Value Ref Range   WBC 6.1 3.4 - 10.8 x10E3/uL   RBC 4.33 3.77 - 5.28 x10E6/uL   Hemoglobin 13.5 11.1 - 15.9 g/dL   Hematocrit 40.2 34.0 - 46.6 %   MCV 93 79 - 97 fL   MCH 31.2 26.6 - 33.0 pg   MCHC 33.6 31.5 - 35.7 g/dL   RDW 12.9 11.7 - 15.4 %   Platelets 194 150 - 450 x10E3/uL   Neutrophils 53 Not Estab. %   Lymphs 38 Not Estab. %   Monocytes 6 Not Estab. %   Eos 2 Not Estab. %   Basos 1 Not Estab. %   Neutrophils Absolute 3.2 1.4 - 7.0 x10E3/uL   Lymphocytes Absolute 2.4 0.7 - 3.1 x10E3/uL   Monocytes Absolute 0.4 0.1 - 0.9 x10E3/uL   EOS (ABSOLUTE) 0.1 0.0 - 0.4 x10E3/uL   Basophils Absolute 0.1 0.0 - 0.2 x10E3/uL   Immature Granulocytes 0 Not Estab. %   Immature Grans (Abs) 0.0 0.0 - 0.1 x10E3/uL  Comprehensive metabolic panel  Result Value Ref Range   Glucose 93 65 - 99 mg/dL   BUN 13 8 - 27 mg/dL   Creatinine, Ser 1.02 (H) 0.57 - 1.00 mg/dL   GFR calc non Af Amer 55 (L) >59 mL/min/1.73   GFR calc Af Amer 63 >59 mL/min/1.73   BUN/Creatinine Ratio 13 12 - 28   Sodium 140 134 - 144 mmol/L   Potassium 4.0 3.5 - 5.2 mmol/L   Chloride 103 96 -  106 mmol/L   CO2 22 20 - 29 mmol/L   Calcium 9.6 8.7 - 10.3 mg/dL   Total Protein 6.5 6.0 - 8.5 g/dL   Albumin 4.6 3.7 - 4.7 g/dL   Globulin, Total 1.9 1.5 - 4.5 g/dL   Albumin/Globulin Ratio 2.4 (H) 1.2 - 2.2   Bilirubin Total 0.3 0.0 - 1.2 mg/dL   Alkaline Phosphatase 96 44 - 121 IU/L   AST 13 0 - 40 IU/L   ALT 15 0 - 32 IU/L  VITAMIN D 25 Hydroxy (Vit-D Deficiency, Fractures)  Result Value Ref Range   Vit D, 25-Hydroxy 27.1 (L) 30.0 - 100.0 ng/mL  Urinalysis, Routine w reflex microscopic  Result Value Ref Range   Specific Gravity, UA 1.016 1.005 - 1.030   pH, UA 5.0 5.0 - 7.5   Color, UA Yellow Yellow   Appearance Ur Clear Clear   Leukocytes,UA Negative Negative   Protein,UA Negative Negative/Trace   Glucose, UA Negative Negative   Ketones, UA Negative Negative   RBC, UA Negative Negative   Bilirubin, UA Negative Negative   Urobilinogen, Ur 0.2 0.2 - 1.0 mg/dL   Nitrite, UA Negative Negative   Microscopic Examination Comment   TSH  Result Value Ref Range   TSH 1.650 0.450 - 4.500 uIU/mL  Lipid Panel With LDL/HDL Ratio  Result Value Ref Range   Cholesterol, Total 239 (H) 100 - 199 mg/dL   Triglycerides 124 0 - 149 mg/dL   HDL 48 >39 mg/dL   VLDL Cholesterol Cal 22 5 - 40 mg/dL   LDL Chol Calc (NIH) 169 (H) 0 - 99 mg/dL   LDL/HDL Ratio 3.5 (H) 0.0 -  3.2 ratio      Assessment & Plan:   Problem List Items Addressed This Visit       Cardiovascular and Mediastinum   Atherosclerosis of aortic bifurcation and common iliac arteries ([place])    Will keep her BP and cholesterol under good control. Continue to monitor. Call with any concerns.       Relevant Orders   CBC with Differential/Platelet   Comprehensive metabolic panel     Respiratory   COPD (chronic obstructive pulmonary disease) with chronic bronchitis (HCC)    Under good control on current regimen. Continue current regimen. Continue to monitor. Call with any concerns. Refills given. Labs drawn today.        Relevant Medications   montelukast (SINGULAIR) 10 MG tablet   albuterol (VENTOLIN HFA) 108 (90 Base) MCG/ACT inhaler   umeclidinium bromide (INCRUSE ELLIPTA) 62.5 MCG/ACT AEPB   Other Relevant Orders   CBC with Differential/Platelet   Comprehensive metabolic panel     Other   Lupus (HCC)    Stable. Continue to monitor. Continue to follow with rheumatology.      Relevant Orders   CBC with Differential/Platelet   Comprehensive metabolic panel   Urinalysis, Routine w reflex microscopic   Generalized anxiety disorder    Under good control on current regimen. Continue current regimen. Continue to monitor. Call with any concerns. Refills given for 3 months. Follow up 3 months.        Relevant Medications   ALPRAZolam (XANAX) 0.5 MG tablet   Other Relevant Orders   CBC with Differential/Platelet   Comprehensive metabolic panel   TSH   Hyperlipidemia    Rechecking labs. Has declined statins. Await results. Treat as needed.       Relevant Orders   CBC with Differential/Platelet   Comprehensive metabolic panel   Lipid Panel w/o Chol/HDL Ratio   Vitamin D deficiency    Rechecking labs. Await results. Treat as needed.       Relevant Orders   CBC with Differential/Platelet   Comprehensive metabolic panel   VITAMIN D 25 Hydroxy (Vit-D Deficiency, Fractures)   Other Visit Diagnoses     Routine general medical examination at a health care facility    -  Primary   Vaccines up to date. Screening labs checked today. Mammo and colonoscopy up to date. Continue diet and exercise. Call with any concerns. Continue to monitor.   Need for influenza vaccination       Relevant Orders   Flu Vaccine QUAD High Dose(Fluad) (Completed)        Follow up plan: Return in about 3 months (around 04/21/2021).   LABORATORY TESTING:  - Pap smear: not applicable  IMMUNIZATIONS:   - Tdap: Tetanus vaccination status reviewed: last tetanus booster within 10 years. - Influenza: Administered  today - Pneumovax: Up to date - Prevnar: Up to date - COVID: Up to date - Shingrix vaccine: Refused  SCREENING: -Mammogram: Not applicable  - Colonoscopy: Up to date  - Bone Density: Up to date   PATIENT COUNSELING:   Advised to take 1 mg of folate supplement per day if capable of pregnancy.   Sexuality: Discussed sexually transmitted diseases, partner selection, use of condoms, avoidance of unintended pregnancy  and contraceptive alternatives.   Advised to avoid cigarette smoking.  I discussed with the patient that most people either abstain from alcohol or drink within safe limits (<=14/week and <=4 drinks/occasion for males, <=7/weeks and <= 3 drinks/occasion for females) and that the risk  for alcohol disorders and other health effects rises proportionally with the number of drinks per week and how often a drinker exceeds daily limits.  Discussed cessation/primary prevention of drug use and availability of treatment for abuse.   Diet: Encouraged to adjust caloric intake to maintain  or achieve ideal body weight, to reduce intake of dietary saturated fat and total fat, to limit sodium intake by avoiding high sodium foods and not adding table salt, and to maintain adequate dietary potassium and calcium preferably from fresh fruits, vegetables, and low-fat dairy products.    stressed the importance of regular exercise  Injury prevention: Discussed safety belts, safety helmets, smoke detector, smoking near bedding or upholstery.   Dental health: Discussed importance of regular tooth brushing, flossing, and dental visits.    NEXT PREVENTATIVE PHYSICAL DUE IN 1 YEAR. Return in about 3 months (around 04/21/2021).

## 2021-01-22 NOTE — Assessment & Plan Note (Signed)
Under good control on current regimen. Continue current regimen. Continue to monitor. Call with any concerns. Refills given for 3 months. Follow up 3 months.    

## 2021-01-22 NOTE — Assessment & Plan Note (Signed)
Will keep her BP and cholesterol under good control. Continue to monitor. Call with any concerns.

## 2021-01-22 NOTE — Assessment & Plan Note (Signed)
Stable. Continue to monitor. Continue to follow with rheumatology.

## 2021-01-22 NOTE — Assessment & Plan Note (Signed)
Rechecking labs. Await results. Treat as needed.  

## 2021-01-22 NOTE — Assessment & Plan Note (Signed)
Rechecking labs. Has declined statins. Await results. Treat as needed.

## 2021-01-22 NOTE — Assessment & Plan Note (Signed)
Under good control on current regimen. Continue current regimen. Continue to monitor. Call with any concerns. Refills given. Labs drawn today.   

## 2021-01-27 ENCOUNTER — Other Ambulatory Visit: Payer: Self-pay | Admitting: Family Medicine

## 2021-01-28 ENCOUNTER — Encounter: Payer: Self-pay | Admitting: Family Medicine

## 2021-01-28 LAB — CBC WITH DIFFERENTIAL/PLATELET
Basophils Absolute: 0.1 10*3/uL (ref 0.0–0.2)
Basos: 1 %
EOS (ABSOLUTE): 0.2 10*3/uL (ref 0.0–0.4)
Eos: 3 %
Hematocrit: 42.3 % (ref 34.0–46.6)
Hemoglobin: 14.2 g/dL (ref 11.1–15.9)
Immature Grans (Abs): 0 10*3/uL (ref 0.0–0.1)
Immature Granulocytes: 0 %
Lymphocytes Absolute: 1.9 10*3/uL (ref 0.7–3.1)
Lymphs: 35 %
MCH: 32.1 pg (ref 26.6–33.0)
MCHC: 33.6 g/dL (ref 31.5–35.7)
MCV: 96 fL (ref 79–97)
Monocytes Absolute: 0.4 10*3/uL (ref 0.1–0.9)
Monocytes: 7 %
Neutrophils Absolute: 3 10*3/uL (ref 1.4–7.0)
Neutrophils: 54 %
Platelets: 174 10*3/uL (ref 150–450)
RBC: 4.43 x10E6/uL (ref 3.77–5.28)
RDW: 13.4 % (ref 11.7–15.4)
WBC: 5.5 10*3/uL (ref 3.4–10.8)

## 2021-01-28 LAB — URINALYSIS, ROUTINE W REFLEX MICROSCOPIC
Bilirubin, UA: NEGATIVE
Glucose, UA: NEGATIVE
Ketones, UA: NEGATIVE
Leukocytes,UA: NEGATIVE
Nitrite, UA: NEGATIVE
Protein,UA: NEGATIVE
RBC, UA: NEGATIVE
Specific Gravity, UA: 1.013 (ref 1.005–1.030)
Urobilinogen, Ur: 0.2 mg/dL (ref 0.2–1.0)
pH, UA: 6 (ref 5.0–7.5)

## 2021-01-28 LAB — LIPID PANEL W/O CHOL/HDL RATIO
Cholesterol, Total: 233 mg/dL — ABNORMAL HIGH (ref 100–199)
HDL: 47 mg/dL (ref >39)
LDL Chol Calc (NIH): 159 mg/dL — ABNORMAL HIGH (ref 0–99)
Triglycerides: 148 mg/dL (ref 0–149)
VLDL Cholesterol Cal: 27 mg/dL (ref 5–40)

## 2021-01-28 LAB — COMPREHENSIVE METABOLIC PANEL
ALT: 26 IU/L (ref 0–32)
AST: 22 IU/L (ref 0–40)
Albumin/Globulin Ratio: 2.3 — ABNORMAL HIGH (ref 1.2–2.2)
Albumin: 4.5 g/dL (ref 3.7–4.7)
Alkaline Phosphatase: 89 IU/L (ref 44–121)
BUN/Creatinine Ratio: 15 (ref 12–28)
BUN: 16 mg/dL (ref 8–27)
Bilirubin Total: 0.3 mg/dL (ref 0.0–1.2)
CO2: 22 mmol/L (ref 20–29)
Calcium: 10.1 mg/dL (ref 8.7–10.3)
Chloride: 108 mmol/L — ABNORMAL HIGH (ref 96–106)
Creatinine, Ser: 1.09 mg/dL — ABNORMAL HIGH (ref 0.57–1.00)
Globulin, Total: 2 g/dL (ref 1.5–4.5)
Glucose: 89 mg/dL (ref 70–99)
Potassium: 4.7 mmol/L (ref 3.5–5.2)
Sodium: 141 mmol/L (ref 134–144)
Total Protein: 6.5 g/dL (ref 6.0–8.5)
eGFR: 53 mL/min/{1.73_m2} — ABNORMAL LOW (ref >59)

## 2021-01-28 LAB — SPECIMEN STATUS REPORT

## 2021-01-28 LAB — VITAMIN D 25 HYDROXY (VIT D DEFICIENCY, FRACTURES): Vit D, 25-Hydroxy: 23.6 ng/mL — ABNORMAL LOW (ref 30.0–100.0)

## 2021-01-28 LAB — TSH: TSH: 1.75 u[IU]/mL (ref 0.450–4.500)

## 2021-03-28 ENCOUNTER — Other Ambulatory Visit: Payer: Self-pay | Admitting: General Surgery

## 2021-03-28 DIAGNOSIS — R911 Solitary pulmonary nodule: Secondary | ICD-10-CM

## 2021-04-19 ENCOUNTER — Telehealth: Payer: Self-pay | Admitting: Acute Care

## 2021-04-19 ENCOUNTER — Other Ambulatory Visit: Payer: Self-pay

## 2021-04-19 DIAGNOSIS — F17210 Nicotine dependence, cigarettes, uncomplicated: Secondary | ICD-10-CM

## 2021-04-19 DIAGNOSIS — Z87891 Personal history of nicotine dependence: Secondary | ICD-10-CM

## 2021-04-19 NOTE — Telephone Encounter (Signed)
Attempt x2 to contact to schedule LDCT.  No voicemail on home or cell phone.

## 2021-04-21 ENCOUNTER — Other Ambulatory Visit: Payer: Self-pay

## 2021-04-21 ENCOUNTER — Encounter: Payer: Self-pay | Admitting: Family Medicine

## 2021-04-21 ENCOUNTER — Ambulatory Visit (INDEPENDENT_AMBULATORY_CARE_PROVIDER_SITE_OTHER): Payer: Medicare HMO | Admitting: Family Medicine

## 2021-04-21 DIAGNOSIS — F411 Generalized anxiety disorder: Secondary | ICD-10-CM | POA: Diagnosis not present

## 2021-04-21 DIAGNOSIS — I714 Abdominal aortic aneurysm, without rupture, unspecified: Secondary | ICD-10-CM

## 2021-04-21 MED ORDER — ALPRAZOLAM 0.5 MG PO TABS: 0.2500 mg | ORAL_TABLET | Freq: Two times a day (BID) | ORAL | 0 refills | Status: AC | PRN

## 2021-04-21 NOTE — Progress Notes (Signed)
BP 124/73    Pulse 83    Temp 98.3 F (36.8 C)    Wt 153 lb (69.4 kg)    SpO2 97%    BMI 26.26 kg/m    Subjective:    Patient ID: Maureen [name], female    DOB: Dec 09, 1945, 76 y.o.   MRN: [id number]  HPI: Maureen [name] is a 76 y.o. female  Chief Complaint  Patient presents with   Anxiety   Abdominal aortic aneurysm (AAA) without rupture   ANXIETY/STRESS Duration: chronic Status:controlled Anxious mood: yes  Excessive worrying: no Irritability: no  Sweating: no Nausea: no Palpitations:no Hyperventilation: no Panic attacks: no Agoraphobia: no  Obscessions/compulsions: no Depressed mood: no Depression screen [facility] 2/9 01/19/2021 01/06/2020 10/05/2019 07/02/2019 02/27/2019  Decreased Interest 0 2 2 0 0  Down, Depressed, Hopeless 0 _0 0  PHQ - 2 Score 0 _1 0  Altered sleeping 0 0 2 0 0  Tired, decreased energy 0 0 2 0 0  Change in appetite 1 0 2 1 0  Feeling bad or failure about yourself  0 0 0 0 0  Trouble concentrating 0 0 2 0 0  Moving slowly or fidgety/restless 0 0 2 1 0  Suicidal thoughts 0 0 0 0 0  PHQ-9 Score _2 0  Difficult doing work/chores Not difficult at all Not difficult at all Somewhat difficult - Not difficult at all  Some recent data might be [name]   GAD 7 : Generalized Anxiety Score 04/21/2021 01/19/2021 01/06/2020 10/05/2019  Nervous, Anxious, on Edge _3 Control/stop worrying 0 _4 Worry too much - different things _5 Trouble relaxing 0 1 0 2  Restless 0 1 0 2  Easily annoyed or irritable 0 0 0 2  Afraid - awful might happen 1 0 0 2  Total GAD 7 Score _6 Anxiety Difficulty Not difficult at all - Not difficult at all Somewhat difficult   Anhedonia: no Weight changes: no Insomnia: no   Hypersomnia: no Fatigue/loss of energy: no Feelings of worthlessness: no Feelings of guilt: no Impaired concentration/indecisiveness: no Suicidal ideations: no  Crying spells: no Recent Stressors/Life Changes: no    Relationship problems: no   Family stress: no     Financial stress: no    Job stress: no    Recent death/loss: no  Has been worried about her AAA. Following with vascular. No pain.   Relevant past medical, surgical, family and social history reviewed and updated as indicated. Interim medical history since our last visit reviewed. Allergies and medications reviewed and updated.  Review of Systems  Constitutional: Negative.   Respiratory: Negative.    Cardiovascular: Negative.   Gastrointestinal: Negative.   Musculoskeletal: Negative.   Neurological: Negative.   Psychiatric/Behavioral: Negative.     Per HPI unless specifically indicated above     Objective:    BP 124/73    Pulse 83    Temp 98.3 F (36.8 C)    Wt 153 lb (69.4 kg)    SpO2 97%    BMI 26.26 kg/m   Wt Readings from Last 3 Encounters:  04/21/21 153 lb (69.4 kg)  01/19/21 153 lb 3.2 oz (69.5 kg)  11/11/20 148 lb 9.6 oz (67.4 kg)    Physical Exam Vitals and nursing note reviewed.  Constitutional:      General: She is not in acute distress.  Appearance: Normal appearance. She is not ill-appearing, toxic-appearing or diaphoretic.  HENT:     Head: Normocephalic and atraumatic.     Right Ear: External ear normal.     Left Ear: External ear normal.     Nose: Nose normal.     Mouth/Throat:     Mouth: Mucous membranes are moist.     Pharynx: Oropharynx is clear.  Eyes:     General: No scleral icterus.       Right eye: No discharge.        Left eye: No discharge.     Extraocular Movements: Extraocular movements intact.     Conjunctiva/sclera: Conjunctivae normal.     Pupils: Pupils are equal, round, and reactive to light.  Cardiovascular:     Rate and Rhythm: Normal rate and regular rhythm.     Pulses: Normal pulses.     Heart sounds: Normal heart sounds. No murmur heard.   No friction rub. No gallop.  Pulmonary:     Effort: Pulmonary effort is normal. No respiratory distress.     Breath sounds: Normal  breath sounds. No stridor. No wheezing, rhonchi or rales.  Chest:     Chest wall: No tenderness.  Musculoskeletal:        General: Normal range of motion.     Cervical back: Normal range of motion and neck supple.  Skin:    General: Skin is warm and dry.     Capillary Refill: Capillary refill takes less than 2 seconds.     Coloration: Skin is not jaundiced or pale.     Findings: No bruising, erythema, lesion or rash.  Neurological:     General: No focal deficit present.     Mental Status: She is alert and oriented to person, place, and time. Mental status is at baseline.  Psychiatric:        Mood and Affect: Mood normal.        Behavior: Behavior normal.        Thought Content: Thought content normal.        Judgment: Judgment normal.    Results for orders placed or performed in visit on 01/27/21  CBC with Differential/Platelet  Result Value Ref Range   WBC 5.5 3.4 - 10.8 x10E3/uL   RBC 4.43 3.77 - 5.28 x10E6/uL   Hemoglobin 14.2 11.1 - 15.9 g/dL   Hematocrit 42.3 34.0 - 46.6 %   MCV 96 79 - 97 fL   MCH 32.1 26.6 - 33.0 pg   MCHC 33.6 31.5 - 35.7 g/dL   RDW 13.4 11.7 - 15.4 %   Platelets 174 150 - 450 x10E3/uL   Neutrophils 54 Not Estab. %   Lymphs 35 Not Estab. %   Monocytes 7 Not Estab. %   Eos 3 Not Estab. %   Basos 1 Not Estab. %   Neutrophils Absolute 3.0 1.4 - 7.0 x10E3/uL   Lymphocytes Absolute 1.9 0.7 - 3.1 x10E3/uL   Monocytes Absolute 0.4 0.1 - 0.9 x10E3/uL   EOS (ABSOLUTE) 0.2 0.0 - 0.4 x10E3/uL   Basophils Absolute 0.1 0.0 - 0.2 x10E3/uL   Immature Granulocytes 0 Not Estab. %   Immature Grans (Abs) 0.0 0.0 - 0.1 x10E3/uL  Comprehensive metabolic panel  Result Value Ref Range   Glucose 89 70 - 99 mg/dL   BUN 16 8 - 27 mg/dL   Creatinine, Ser 1.09 (H) 0.57 - 1.00 mg/dL   eGFR 53 (L) >59 mL/min/1.73   BUN/Creatinine Ratio 15 12 -  28   Sodium 141 134 - 144 mmol/L   Potassium 4.7 3.5 - 5.2 mmol/L   Chloride 108 (H) 96 - 106 mmol/L   CO2 22 20 - 29 mmol/L    Calcium 10.1 8.7 - 10.3 mg/dL   Total Protein 6.5 6.0 - 8.5 g/dL   Albumin 4.5 3.7 - 4.7 g/dL   Globulin, Total 2.0 1.5 - 4.5 g/dL   Albumin/Globulin Ratio 2.3 (H) 1.2 - 2.2   Bilirubin Total 0.3 0.0 - 1.2 mg/dL   Alkaline Phosphatase 89 44 - 121 IU/L   AST 22 0 - 40 IU/L   ALT 26 0 - 32 IU/L  Urinalysis, Routine w reflex microscopic  Result Value Ref Range   Specific Gravity, UA 1.013 1.005 - 1.030   pH, UA 6.0 5.0 - 7.5   Color, UA Yellow Yellow   Appearance Ur Clear Clear   Leukocytes,UA Negative Negative   Protein,UA Negative Negative/Trace   Glucose, UA Negative Negative   Ketones, UA Negative Negative   RBC, UA Negative Negative   Bilirubin, UA Negative Negative   Urobilinogen, Ur 0.2 0.2 - 1.0 mg/dL   Nitrite, UA Negative Negative   Microscopic Examination Comment   Lipid Panel w/o Chol/HDL Ratio  Result Value Ref Range   Cholesterol, Total 233 (H) 100 - 199 mg/dL   Triglycerides 148 0 - 149 mg/dL   HDL 47 >39 mg/dL   VLDL Cholesterol Cal 27 5 - 40 mg/dL   LDL Chol Calc (NIH) 159 (H) 0 - 99 mg/dL  TSH  Result Value Ref Range   TSH 1.750 0.450 - 4.500 uIU/mL  VITAMIN D 25 Hydroxy (Vit-D Deficiency, Fractures)  Result Value Ref Range   Vit D, 25-Hydroxy 23.6 (L) 30.0 - 100.0 ng/mL  Specimen status report  Result Value Ref Range   specimen status report Comment       Assessment & Plan:   Problem List Items Addressed This Visit       Cardiovascular and Mediastinum   Abdominal aortic aneurysm (AAA) without rupture    Following with vascular and general surgery. To see them in 2 weeks. Continue to monitor. Call with any concerns.         Other   Generalized anxiety disorder    Under good control on current regimen. Continue current regimen. Continue to monitor. Call with any concerns. Has 1 refill left on her xanax. Will get her 1 more to make it to her follow up. Follow up 3 months.       Relevant Medications   ALPRAZolam (XANAX) 0.5 MG tablet      Follow up plan: Return in about 3 months (around 07/19/2021).

## 2021-04-21 NOTE — Assessment & Plan Note (Addendum)
Under good control on current regimen. Continue current regimen. Continue to monitor. Call with any concerns. Has 1 refill left on her xanax. Will get her 1 more to make it to her follow up. Follow up 3 months.

## 2021-04-21 NOTE — Assessment & Plan Note (Signed)
Following with vascular and general surgery. To see them in 2 weeks. Continue to monitor. Call with any concerns.

## 2021-04-27 ENCOUNTER — Other Ambulatory Visit: Payer: Self-pay

## 2021-04-27 ENCOUNTER — Ambulatory Visit
Admission: RE | Admit: 2021-04-27 | Discharge: 2021-04-27 | Disposition: A | Discharge status: Home / Self Care | Payer: Medicare HMO | Source: Ambulatory Visit | Attending: Acute Care | Admitting: Acute Care

## 2021-04-27 DIAGNOSIS — Z87891 Personal history of nicotine dependence: Secondary | ICD-10-CM | POA: Diagnosis not present

## 2021-04-27 DIAGNOSIS — F17210 Nicotine dependence, cigarettes, uncomplicated: Secondary | ICD-10-CM | POA: Diagnosis present

## 2021-05-05 ENCOUNTER — Other Ambulatory Visit: Payer: Self-pay | Admitting: General Surgery

## 2021-05-05 DIAGNOSIS — R911 Solitary pulmonary nodule: Secondary | ICD-10-CM

## 2021-05-11 ENCOUNTER — Other Ambulatory Visit: Payer: Self-pay

## 2021-05-11 DIAGNOSIS — R911 Solitary pulmonary nodule: Secondary | ICD-10-CM

## 2021-05-11 DIAGNOSIS — Z87891 Personal history of nicotine dependence: Secondary | ICD-10-CM

## 2021-05-12 ENCOUNTER — Other Ambulatory Visit: Payer: Self-pay

## 2021-05-12 ENCOUNTER — Ambulatory Visit (INDEPENDENT_AMBULATORY_CARE_PROVIDER_SITE_OTHER): Payer: Medicare HMO

## 2021-05-12 ENCOUNTER — Encounter (INDEPENDENT_AMBULATORY_CARE_PROVIDER_SITE_OTHER): Payer: Self-pay | Admitting: Vascular Surgery

## 2021-05-12 ENCOUNTER — Ambulatory Visit (INDEPENDENT_AMBULATORY_CARE_PROVIDER_SITE_OTHER): Payer: Medicare Other

## 2021-05-12 ENCOUNTER — Other Ambulatory Visit: Payer: Medicare HMO

## 2021-05-12 ENCOUNTER — Ambulatory Visit (INDEPENDENT_AMBULATORY_CARE_PROVIDER_SITE_OTHER): Payer: Medicare HMO | Admitting: Vascular Surgery

## 2021-05-12 VITALS — BP 121/66 | HR 80 | Resp 16 | Wt 148.4 lb

## 2021-05-12 DIAGNOSIS — I700 Atherosclerosis of aorta: Secondary | ICD-10-CM

## 2021-05-12 DIAGNOSIS — E782 Mixed hyperlipidemia: Secondary | ICD-10-CM

## 2021-05-12 DIAGNOSIS — J449 Chronic obstructive pulmonary disease, unspecified: Secondary | ICD-10-CM | POA: Diagnosis not present

## 2021-05-12 DIAGNOSIS — I708 Atherosclerosis of other arteries: Secondary | ICD-10-CM

## 2021-05-12 DIAGNOSIS — K219 Gastro-esophageal reflux disease without esophagitis: Secondary | ICD-10-CM | POA: Diagnosis not present

## 2021-05-12 DIAGNOSIS — I714 Abdominal aortic aneurysm, without rupture, unspecified: Secondary | ICD-10-CM

## 2021-05-12 NOTE — Progress Notes (Signed)
Case Not discussed

## 2021-05-15 ENCOUNTER — Encounter (INDEPENDENT_AMBULATORY_CARE_PROVIDER_SITE_OTHER): Payer: Self-pay | Admitting: Vascular Surgery

## 2021-05-15 NOTE — Progress Notes (Signed)
MRN : [id number]  Maureen [name] is a 76 y.o. ([birth year]) female who presents with chief complaint of check aneurysm.  History of Present Illness:  The patient presents to the office for evaluation of an abdominal aortic aneurysm. The aneurysm was found incidentally by CT scan of the chest that was ordered to evaluate a lung nodule.  However, since this was a study focusing on the chest the abdominal aortic aneurysm was incompletely characterized.  Subsequently duplex ultrasound of the abdominal aorta was obtained to more completely characterize the aneurysm.  The infrarenal aortic aneurysm measures 4.1 cm patient denies abdominal pain or unusual back pain, no other abdominal complaints.  No history of an acute onset of painful blue discoloration of the toes.      No family history of AAA.    There is a history of claudication but no rest pain symptoms of the lower extremities.  No prior interventions.  No wounds or sores   Patient denies amaurosis fugax or TIA symptoms.    The patient denies angina or shortness of breath.  ABI's Rt=1.18 and Lt=0.87  Aortic Duplex shows AAA 4.33 cm   Current Meds  Medication Sig   acetaminophen (TYLENOL) 500 MG tablet Take 500 mg by mouth every 6 (six) hours as needed for headache.   albuterol (PROVENTIL) (2.5 MG/3ML) 0.083% nebulizer solution Take 3 mLs (2.5 mg total) by nebulization every 6 (six) hours as needed for wheezing or shortness of breath.   albuterol (VENTOLIN HFA) 108 (90 Base) MCG/ACT inhaler Inhale 1-2 puffs into the lungs every 6 (six) hours as needed for wheezing or shortness of breath.   ALPRAZolam (XANAX) 0.5 MG tablet Take 0.5-1 tablets (0.25-0.5 mg total) by mouth 2 (two) times daily as needed for anxiety.   Ascorbic Acid (VITAMIN C) 100 MG tablet Take 100 mg by mouth daily.   Calcium-Vitamins C & D (CALCIUM/C/D PO) Take by mouth daily.   Cholecalciferol (VITAMIN D3) 50 MCG (2000 UT) TABS Take by mouth.   clobetasol cream  (TEMOVATE) 0.35 % Apply 1 application topically daily.   Homeopathic Products (ZICAM ALLERGY RELIEF NA) Place 2 sprays into the nose daily as needed (allergies).    KRILL OIL PO Take by mouth daily.   montelukast (SINGULAIR) 10 MG tablet TAKE 1 TABLET BY MOUTH EVERYDAY AT BEDTIME   Multiple Vitamin (MULTIVITAMIN PO) Take by mouth daily.   omeprazole (PRILOSEC) 20 MG capsule Take 1 capsule (20 mg total) by mouth daily.   umeclidinium bromide (INCRUSE ELLIPTA) 62.5 MCG/ACT AEPB Inhale 1 puff into the lungs daily.   vitamin E 100 UNIT capsule Take by mouth daily.   zinc gluconate 50 MG tablet Take 50 mg by mouth every other day.    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 This was a histologic grade 2 tumor measuring just under 1 cm in diameter. No invasive cancer was identified. ER 90%, PR 40%.   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   Discoid lupus 2005   GERD (gastroesophageal reflux disease)    Heart murmur 2005   Hypercholesterolemia 2012   Mammographic microcalcification 2011   Panic attack    Personal history of chemotherapy 1994   Personal history of colonic polyps    Colon polyps  Personal history of tobacco use, presenting hazards to health    PUD (peptic ulcer disease)    Last EGD 1/09   Rectal polyp 2008   villous adenoma; last colonoscopy 1/09 WNL   Skin cancer, basal cell 2015   right cheek Mohs surgery at [place]     Past Surgical History:  Procedure Laterality Date   APPENDECTOMY  2011   BREAST BIOPSY  12/27/2009   right breast   CARDIAC CATHETERIZATION  1996   Negative Cath   COLONOSCOPY N/A 08/05/2014   Procedure: COLONOSCOPY;  Surgeon: [name], MD;  Location: [facility] ENDOSCOPY;  EGD, tubular adenoma 1.    COLONOSCOPY W/ BIOPSIES  2005,2009   Dr. Bary Castilla. Normal exam 2009. Villous adenoma of the rectum in 2005 adenomatous polyp from the ascending colon and rectum in 2002.   ESOPHAGOGASTRODUODENOSCOPY N/A 08/05/2014   Procedure: ESOPHAGOGASTRODUODENOSCOPY (EGD);  Surgeon: [name], MD;  Location[facility] END[facility]COPY;  Service: Endoscopy;  Laterality: N/A;   ESOPHAGOGASTRODUODENOSCOPY (EGD) WITH PROPOFOL N/A 07/25/2017   Procedure: ESOPHAGOGASTRODUODENOSCOPY (EGD) WITH PROPOFOL;  Surgeon: [name], MD;  Location: [facility] ENDOSCOPY;  Service: Endoscopy;  Laterality: N/A;   MASTECTOMY     bilateral   SIMPLE MASTECTOMY  1993   right   SIMPLE MASTECTOMY  2011   left   TONSILLECTOMY  1997   UPPER GI ENDOSCOPY  2003, 2005, 2009, 2012, 2014   2012 showed mild reflux changes without evidence of Barrett's epithelial changes 2014 biopsies at 37 cm showed changes suggestive of reflux esophagitis. No metaplasia.   VAGINAL HYSTERECTOMY  1977    Social History Social History   Tobacco Use   Smoking status: Every Day    Packs/day: 0.50    Years: 40.00    Pack years: 20.00    Types: Cigarettes   Smokeless tobacco: Never   Tobacco comments:    Pt wants to do hypnotizing  Vaping Use   Vaping Use: Never used  Substance Use Topics   Alcohol use: Not Currently    Alcohol/week: 1.0 standard drink    Types: 1 Cans of beer per week   Drug use: No    Family History Family History  Problem Relation Age of Onset   Cancer Father        esophagus   Cancer Maternal Aunt        breast cancer   Cancer Maternal Grandmother        breast cancer    Allergies  Allergen Reactions   Codeine Itching   Sulfa Antibiotics Hives   Sulfinpyrazone    Erythromycin Rash   Penicillins Rash     REVIEW OF SYSTEMS (Negative unless checked)  Constitutional: [] Weight loss  [] Fever  [] Chills Cardiac: [] Chest pain   [] Chest pressure   [] Palpitations   [] Shortness of breath when laying flat   [] Shortness  of breath with exertion. Vascular:  [] Pain in legs with walking   [] Pain in legs at rest  [] History of DVT   [] Phlebitis   [] Swelling in legs   [] Varicose veins   [] Non-healing ulcers Pulmonary:   [] Uses home oxygen   [] Productive cough   [] Hemoptysis   [] Wheeze  [] COPD   [] Asthma Neurologic:  [] Dizziness   [] Seizures   [] History of stroke   [] History of TIA  [] Aphasia   [] Vissual changes   [] Weakness or numbness in arm   [] Weakness or numbness in leg Musculoskeletal:   [] Joint swelling   [] Joint pain   []   Low back pain Hematologic:  [] Easy bruising  [] Easy bleeding   [] Hypercoagulable state   [] Anemic Gastrointestinal:  [] Diarrhea   [] Vomiting  [x] Gastroesophageal reflux/heartburn   [] Difficulty swallowing. Genitourinary:  [] Chronic kidney disease   [] Difficult urination  [] Frequent urination   [] Blood in urine Skin:  [] Rashes   [] Ulcers  Psychological:  [] History of anxiety   []  History of major depression.  Physical Examination  Vitals:   05/12/21 0947  BP: 121/66  Pulse: 80  Resp: 16  Weight: 148 lb 6.4 oz (67.3 kg)   Body mass index is 26.29 kg/m. Gen: WD/WN, NAD Head: [place]/AT, No temporalis wasting.  Ear/Nose/Throat: Hearing grossly intact, nares w/o erythema or drainage Eyes: PER, EOMI, sclera nonicteric.  Neck: Supple, no masses.  No bruit or JVD.  Pulmonary:  Good air movement, no audible wheezing, no use of accessory muscles.  Cardiac: RRR, normal S1, S2, no Murmurs. Vascular:   Vessel Right Left  Radial Palpable Palpable  Carotid Palpable Palpable  PT Palpable Not Palpable  DP Palpable Not Palpable  Gastrointestinal: soft, non-distended. No guarding/no peritoneal signs.  Musculoskeletal: M/S 5/5 throughout.  No visible deformity.  Neurologic: CN 2-12 intact. Pain and light touch intact in extremities.  Symmetrical.  Speech is fluent. Motor exam as listed above. Psychiatric: Judgment intact, Mood & affect appropriate for pt's clinical situation. Dermatologic: No rashes  or ulcers noted.  No changes consistent with cellulitis.   CBC Lab Results  Component Value Date   WBC 5.5 01/27/2021   HGB 14.2 01/27/2021   HCT 42.3 01/27/2021   MCV 96 01/27/2021   PLT 174 01/27/2021    BMET    Component Value Date/Time   NA 141 01/27/2021 0000   K 4.7 01/27/2021 0000   CL 108 (H) 01/27/2021 0000   CO2 22 01/27/2021 0000   GLUCOSE 89 01/27/2021 0000   GLUCOSE 95 12/10/2014 1616   BUN 16 01/27/2021 0000   CREATININE 1.09 (H) 01/27/2021 0000   CALCIUM 10.1 01/27/2021 0000   GFRNONAA 55 (L) 02/03/2020 0859   GFRAA 63 02/03/2020 0859   CrCl cannot be calculated (Patient's most recent lab result is older than the maximum 21 days allowed.).  COAG No results found for: INR, PROTIME  Radiology CT CHEST LUNG CA SCREEN LOW DOSE W/O CM  Result Date: 04/28/2021 CLINICAL DATA:  Current smoker with 43 pack-year history EXAM: CT CHEST WITHOUT CONTRAST LOW-DOSE FOR LUNG CANCER SCREENING TECHNIQUE: Multidetector CT imaging of the chest was performed following the standard protocol without IV contrast. RADIATION DOSE REDUCTION: This exam was performed according to the departmental dose-optimization program which includes automated exposure control, adjustment of the mA and/or kV according to patient size and/or use of iterative reconstruction technique. COMPARISON:  Multiple priors, most recent dated April 19, 2020 FINDINGS: Cardiovascular: Normal heart size. No pericardial effusion. Left main and LAD and circumflex coronary artery calcifications. Atherosclerotic disease of the thoracic aorta. Mediastinum/Nodes: Esophagus and thyroid are unremarkable. Surgical clips of the right axilla. No pathologically enlarged lymph nodes seen in the chest. Lungs/Pleura: Central airways are patent. Centrilobular emphysema. No consolidation, pleural effusion or pneumothorax. New part solid nodule of the right upper lobe located image 113 measuring 9.8 mm in overall diameter with 4.6 mm solid  component. New solid nodule of the left upper lobe measuring 5.8 mm in mean diameter. Nodule of the left upper lobe which was seen on prior exam is no longer present. Upper Abdomen: Partially imaged low-attenuation lesion of the right kidney which is  likely a simple cyst. Unchanged bilateral adrenal gland nodules which are likely benign adenomas. No acute abnormality. Musculoskeletal: No chest wall mass or suspicious bone lesions identified. IMPRESSION: 1. New part solid nodule of the right upper lobe measuring 9.8 mm in overall diameter with 4.6 mm solid component. New solid nodule of the left upper lobemeasuring 5.8 mm in mean diameter. Lung-RADS 3, probably benign findings. Short-term follow-up in 6 months is recommended with repeat low-dose chest CT without contrast (please use the following order, "CT CHEST LCS NODULE FOLLOW-UP W/O CM"). 2. Coronary artery calcifications. 3. Aortic Atherosclerosis (ICD10-I70.0) and Emphysema (ICD10-J43.9). Electronically Signed   By: Yetta Glassman M.D.   On: 04/28/2021 16:33     Assessment/Plan 1. Abdominal aortic aneurysm (AAA) without rupture, unspecified part No surgery or intervention at this time. The patient has an asymptomatic abdominal aortic aneurysm that is greater than 4 cm but less than 5 cm in maximal diameter.  I have discussed the natural history of abdominal aortic aneurysm and the small risk of rupture for aneurysm less than 5 cm in size.  However, as these small aneurysms tend to enlarge over time, continued surveillance with ultrasound or CT scan is mandatory.  I have also discussed optimizing medical management with hypertension and lipid control and the importance of abstinence from tobacco.  The patient is also encouraged to exercise a minimum of 30 minutes 4 times a week.  Should the patient develop new onset abdominal or back pain or signs of peripheral embolization they are instructed to seek medical attention immediately and to alert the  physician providing care that they have an aneurysm.  The patient voices their understanding. I have scheduled the patient to return in 6 months with an aortic duplex.  - VAS US AORTA/IVC/ILIACS; Future  2. 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  The patient does not voice lifestyle limiting changes at this point in time.  Noninvasive studies do not suggest clinically significant change.  No invasive studies, angiography or surgery at this time The patient should continue walking and begin a more formal exercise program.  The patient should continue antiplatelet therapy and aggressive treatment of the lipid abnormalities  No changes in the patient's medications at this time  The patient should continue wearing graduated compression socks 10-15 mmHg strength to control the mild edema.   - VAS [place] ABI WITH/WO TBI; Future  3. COPD (chronic obstructive pulmonary disease) with chronic bronchitis (HCC) Continue pulmonary medications and aerosols as already ordered, these medications have been reviewed and there are no changes at this time.    4. Gastroesophageal reflux disease, unspecified whether esophagitis present Continue PPI as already ordered, this medication has been reviewed and there are no changes at this time.  Avoidence of caffeine and alcohol  Moderate elevation of the head of the bed    5. Mixed hyperlipidemia Continue statin as ordered and reviewed, no changes at this time     Hortencia Pilar, MD  05/15/2021 1:45 PM

## 2021-05-17 ENCOUNTER — Encounter: Payer: Self-pay | Admitting: *Deleted

## 2021-05-25 ENCOUNTER — Other Ambulatory Visit: Payer: Self-pay

## 2021-05-25 ENCOUNTER — Encounter: Payer: Self-pay | Admitting: Radiation Oncology

## 2021-05-25 ENCOUNTER — Ambulatory Visit
Admission: RE | Admit: 2021-05-25 | Discharge: 2021-05-25 | Disposition: A | Discharge status: Home / Self Care | Payer: Medicare HMO | Source: Ambulatory Visit | Attending: Radiation Oncology | Admitting: Radiation Oncology

## 2021-05-25 ENCOUNTER — Other Ambulatory Visit: Payer: Self-pay | Admitting: *Deleted

## 2021-05-25 DIAGNOSIS — Z9013 Acquired absence of bilateral breasts and nipples: Secondary | ICD-10-CM | POA: Diagnosis not present

## 2021-05-25 DIAGNOSIS — M129 Arthropathy, unspecified: Secondary | ICD-10-CM | POA: Insufficient documentation

## 2021-05-25 DIAGNOSIS — R911 Solitary pulmonary nodule: Secondary | ICD-10-CM

## 2021-05-25 DIAGNOSIS — Z86010 Personal history of colonic polyps: Secondary | ICD-10-CM | POA: Insufficient documentation

## 2021-05-25 DIAGNOSIS — E7800 Pure hypercholesterolemia, unspecified: Secondary | ICD-10-CM | POA: Diagnosis not present

## 2021-05-25 DIAGNOSIS — Z923 Personal history of irradiation: Secondary | ICD-10-CM | POA: Insufficient documentation

## 2021-05-25 DIAGNOSIS — C3490 Malignant neoplasm of unspecified part of unspecified bronchus or lung: Secondary | ICD-10-CM

## 2021-05-25 DIAGNOSIS — F17210 Nicotine dependence, cigarettes, uncomplicated: Secondary | ICD-10-CM | POA: Insufficient documentation

## 2021-05-25 DIAGNOSIS — J449 Chronic obstructive pulmonary disease, unspecified: Secondary | ICD-10-CM | POA: Insufficient documentation

## 2021-05-25 DIAGNOSIS — Z79899 Other long term (current) drug therapy: Secondary | ICD-10-CM | POA: Diagnosis not present

## 2021-05-25 DIAGNOSIS — R011 Cardiac murmur, unspecified: Secondary | ICD-10-CM | POA: Insufficient documentation

## 2021-05-25 DIAGNOSIS — J439 Emphysema, unspecified: Secondary | ICD-10-CM | POA: Diagnosis not present

## 2021-05-25 DIAGNOSIS — Z809 Family history of malignant neoplasm, unspecified: Secondary | ICD-10-CM | POA: Insufficient documentation

## 2021-05-25 DIAGNOSIS — I714 Abdominal aortic aneurysm, without rupture, unspecified: Secondary | ICD-10-CM | POA: Diagnosis not present

## 2021-05-25 DIAGNOSIS — Z853 Personal history of malignant neoplasm of breast: Secondary | ICD-10-CM | POA: Diagnosis not present

## 2021-05-25 DIAGNOSIS — K219 Gastro-esophageal reflux disease without esophagitis: Secondary | ICD-10-CM | POA: Insufficient documentation

## 2021-05-25 DIAGNOSIS — R918 Other nonspecific abnormal finding of lung field: Secondary | ICD-10-CM | POA: Diagnosis not present

## 2021-05-25 NOTE — Consult Note (Signed)
NEW PATIENT EVALUATION  Name: Maureen [name]  MRN: [id number]  Date:   05/25/2021     DOB: Sep 05, 1945   This 76 y.o. female patient presents to the clinic for initial evaluation of presumed non-small cell lung cancer of the right upper lobe measuring approximate 1 cm in greatest dimension picked up on screening CT scan.  REFERRING PHYSICIAN: Valerie Roys, DO  CHIEF COMPLAINT:  Chief Complaint  Patient presents with   Lung Cancer    Initial consultation    DIAGNOSIS: The encounter diagnosis was Lung nodule.   PREVIOUS INVESTIGATIONS:  CT scan reviewed PET CT scan ordered Labs reviewed Clinical notes reviewed  HPI: Patient is a 76 year old female with history of bilateral breast cancer emphysema COPD abdominal aortic aneurysm.  Patient also has discoid lupus.  She has breast status post bilateral mastectomies also had chemotherapy for her breast cancer.  She was picked up a screening CT scan to have a new solid nodule in the right upper lobe measuring 9.8 mm.  She is asymptomatic does have a mild nonproductive cough no hemoptysis or chest tightness.  She is now referred to ration collagen for consideration of an opinion.  PLANNED TREATMENT REGIMEN: PET scan followed by possible SBRT  PAST MEDICAL HISTORY:  has a past medical history of Abdominal aortic aneurysm (AAA) without rupture, Arthritis (2005), Breast cancer, left breast ([place]) (2011), Breast cancer, right breast ([place]) (1994), COPD (chronic obstructive pulmonary disease) ([place]), Discoid lupus (2005), Discoid lupus (2005), Emphysematous bleb ([place]), GERD (gastroesophageal reflux disease), Heart murmur (2005), Hypercholesterolemia (2012), Mammographic microcalcification (2011), Panic attack, Personal history of chemotherapy (1994), Personal history of colonic polyps, Personal history of other malignant neoplasm of skin (2015), Personal history of tobacco use, presenting hazards to health, PUD (peptic ulcer disease), Pulmonary  nodule, Rectal polyp (2008), Skin cancer, basal cell (2015), Tobacco use, and Ulcer.    PAST SURGICAL HISTORY:  Past Surgical History:  Procedure Laterality Date   APPENDECTOMY  2011   BREAST BIOPSY  12/27/2009   right breast   CARDIAC CATHETERIZATION  1996   Negative Cath   COLONOSCOPY N/A 08/05/2014   Procedure: COLONOSCOPY;  Surgeon: [name], MD;  Location: [facility] ENDOSCOPY;  EGD, tubular adenoma 1.   COLONOSCOPY W/ BIOPSIES  2005,2009   Dr. Bary Castilla. Normal exam 2009. Villous adenoma of the rectum in 2005 adenomatous polyp from the ascending colon and rectum in 2002.   ESOPHAGOGASTRODUODENOSCOPY N/A 08/05/2014   Procedure: ESOPHAGOGASTRODUODENOSCOPY (EGD);  Surgeon: [name], MD;  Location: [facility] ENDOSCOPY;  Service: Endoscopy;  Laterality: N/A;   ESOPHAGOGASTRODUODENOSCOPY (EGD) WITH PROPOFOL N/A 07/25/2017   Procedure: ESOPHAGOGASTRODUODENOSCOPY (EGD) WITH PROPOFOL;  Surgeon: [name], MD;  Location: [facility] ENDOSCOPY;  Service: Endoscopy;  Laterality: N/A;   MASTECTOMY     bilateral   SIMPLE MASTECTOMY  1993   right   SIMPLE MASTECTOMY  2011   left   TONSILLECTOMY  1997   UPPER GI ENDOSCOPY  2003, 2005, 2009, 2012, 2014   2012 showed mild reflux changes without evidence of Barrett's epithelial changes 2014 biopsies at 37 cm showed changes suggestive of reflux esophagitis. No metaplasia.   VAGINAL HYSTERECTOMY  1977    FAMILY HISTORY: family history includes Cancer in her father, maternal aunt, and maternal grandmother.  SOCIAL HISTORY:  reports that she has been smoking cigarettes. She has a 20.00 pack-year smoking history. She has never used smokeless tobacco. She reports that she does not currently use alcohol after a past usage of  about 1.0 standard drink per week. She reports that she does not use drugs.  ALLERGIES: Codeine, Sulfa antibiotics, Sulfinpyrazone, Erythromycin, and Penicillins  MEDICATIONS:  Current Outpatient Medications  Medication Sig  Dispense Refill   acetaminophen (TYLENOL) 500 MG tablet Take 500 mg by mouth every 6 (six) hours as needed for headache.     albuterol (PROVENTIL) (2.5 MG/3ML) 0.083% nebulizer solution Take 3 mLs (2.5 mg total) by nebulization every 6 (six) hours as needed for wheezing or shortness of breath. 150 mL 1   albuterol (VENTOLIN HFA) 108 (90 Base) MCG/ACT inhaler Inhale 1-2 puffs into the lungs every 6 (six) hours as needed for wheezing or shortness of breath. 18 each 6   ALPRAZolam (XANAX) 0.5 MG tablet Take 0.5 mg by mouth at bedtime as needed for anxiety.     Ascorbic Acid (VITAMIN C) 100 MG tablet Take 100 mg by mouth daily.     azithromycin (ZITHROMAX) 250 MG tablet Take by mouth daily. For 21 days  filled 05/06/21     Calcium-Vitamins C & D (CALCIUM/C/D PO) Take by mouth daily.     Cholecalciferol (VITAMIN D3) 50 MCG (2000 UT) TABS Take by mouth.     clobetasol cream (TEMOVATE) 2.50 % Apply 1 application topically daily.     Homeopathic Products (ZICAM ALLERGY RELIEF NA) Place 2 sprays into the nose daily as needed (allergies).      KRILL OIL PO Take by mouth daily.     Multiple Vitamin (MULTIVITAMIN PO) Take by mouth daily.     omeprazole (PRILOSEC) 20 MG capsule Take 1 capsule (20 mg total) by mouth daily. 90 capsule 1   predniSONE (DELTASONE) 10 MG tablet Take 10 mg by mouth daily. Take for 21 days, Filled 05/06/21     umeclidinium bromide (INCRUSE ELLIPTA) 62.5 MCG/ACT AEPB Inhale 1 puff into the lungs daily. 30 each 6   vitamin E 100 UNIT capsule Take by mouth daily.     zinc gluconate 50 MG tablet Take 50 mg by mouth every other day.     montelukast (SINGULAIR) 10 MG tablet TAKE 1 TABLET BY MOUTH EVERYDAY AT BEDTIME (Patient not taking: Reported on 05/25/2021) 90 tablet 1   No current facility-administered medications for this encounter.    ECOG PERFORMANCE STATUS:  0 - Asymptomatic  REVIEW OF SYSTEMS: Patient denies any weight loss, fatigue, weakness, fever, chills or night sweats.  Patient denies any loss of vision, blurred vision. Patient denies any ringing  of the ears or hearing loss. No irregular heartbeat. Patient denies heart murmur or history of fainting. Patient denies any chest pain or pain radiating to her upper extremities. Patient denies any shortness of breath, difficulty breathing at night, cough or hemoptysis. Patient denies any swelling in the lower legs. Patient denies any nausea vomiting, vomiting of blood, or coffee ground material in the vomitus. Patient denies any stomach pain. Patient states has had normal bowel movements no significant constipation or diarrhea. Patient denies any dysuria, hematuria or significant nocturia. Patient denies any problems walking, swelling in the joints or loss of balance. Patient denies any skin changes, loss of hair or loss of weight. Patient denies any excessive worrying or anxiety or significant depression. Patient denies any problems with insomnia. Patient denies excessive thirst, polyuria, polydipsia. Patient denies any swollen glands, patient denies easy bruising or easy bleeding. Patient denies any recent infections, allergies or URI. Patient "s visual fields have not changed significantly in recent time.   PHYSICAL EXAM: BP (P) 130/89 (  BP Location: Left Arm, Patient Position: Sitting)    Pulse (P) 91    Temp (!) (P) 97.4 F (36.3 C) (Tympanic)    Resp (P) 16    Wt (P) 150 lb 4.8 oz (68.2 kg)    BMI (P) 26.62 kg/m  Well-developed well-nourished patient in NAD. HEENT reveals PERLA, EOMI, discs not visualized.  Oral cavity is clear. No oral mucosal lesions are identified. Neck is clear without evidence of cervical or supraclavicular adenopathy. Lungs are clear to A&P. Cardiac examination is essentially unremarkable with regular rate and rhythm without murmur rub or thrill. Abdomen is benign with no organomegaly or masses noted. Motor sensory and DTR levels are equal and symmetric in the upper and lower extremities. Cranial nerves  II through XII are grossly intact. Proprioception is intact. No peripheral adenopathy or edema is identified. No motor or sensory levels are noted. Crude visual fields are within normal range.  LABORATORY DATA: Labs reviewed    RADIOLOGY RESULTS: CT scan reviewed PET CT scan ordered   IMPRESSION: Probable non-small cell lung cancer of the right upper lobe and 76 year old female with multiple comorbidities.  PLAN: I have discussed the case with pulmonology.  She is not a suitable candidate for biopsy either with CT-guided fine-needle or navigational bronc.  I believe the best strategy would be to wait another 2 months and perform a PET CT scan.  Should there be hypermetabolic activity would offer SBRT 60 Gray in 5 fractions.  Risks and benefits of SBRT including the low side effect profile possible fatigue possible development of cough all were discussed with the patient and her family.  They all seem to comprehend my treatment plan well.  We will see her back in follow-up shortly after her PET scan.  Patient comprehends my recommendations well.  I would like to take this opportunity to thank you for allowing me to participate in the care of your patient.Noreene Filbert, MD

## 2021-07-14 ENCOUNTER — Telehealth: Payer: Self-pay | Admitting: Family Medicine

## 2021-07-14 NOTE — Telephone Encounter (Signed)
Patient states her nebulizer machine broke and would like PCP to send in a new machine. ? ?[id number] - [place], [place] - [address] Phone:  [phone]  ?Fax:  [phone]  ?  ? ?

## 2021-07-15 ENCOUNTER — Other Ambulatory Visit: Payer: Self-pay

## 2021-07-15 DIAGNOSIS — J449 Chronic obstructive pulmonary disease, unspecified: Secondary | ICD-10-CM

## 2021-07-15 NOTE — Telephone Encounter (Signed)
Placed RX in folder for signature.  ?

## 2021-07-15 NOTE — Telephone Encounter (Signed)
Please write Rx and I'll sign ?

## 2021-07-18 ENCOUNTER — Ambulatory Visit: Admission: RE | Admit: 2021-07-18 | Payer: Medicare HMO | Source: Ambulatory Visit

## 2021-07-19 ENCOUNTER — Ambulatory Visit: Payer: Medicare HMO | Admitting: Family Medicine

## 2021-07-21 ENCOUNTER — Ambulatory Visit: Payer: Medicare HMO | Admitting: Radiation Oncology

## 2021-07-22 ENCOUNTER — Ambulatory Visit (HOSPITAL_COMMUNITY)
Admission: RE | Admit: 2021-07-22 | Discharge: 2021-07-22 | Disposition: A | Discharge status: Home / Self Care | Payer: Medicare HMO | Source: Ambulatory Visit | Attending: Radiation Oncology | Admitting: Radiation Oncology

## 2021-07-22 DIAGNOSIS — C3490 Malignant neoplasm of unspecified part of unspecified bronchus or lung: Secondary | ICD-10-CM | POA: Diagnosis present

## 2021-07-22 LAB — GLUCOSE, CAPILLARY: Glucose-Capillary: 97 mg/dL (ref 70–99)

## 2021-07-22 MED ORDER — FLUDEOXYGLUCOSE F - 18 (FDG) INJECTION
7.4100 | Freq: Once | INTRAVENOUS | Status: AC
  Administered 2021-07-22: 7.41 via INTRAVENOUS

## 2021-07-26 ENCOUNTER — Other Ambulatory Visit: Payer: Self-pay | Admitting: General Surgery

## 2021-07-26 NOTE — Progress Notes (Signed)
Mayer Masker, MD - 07/26/2021 11:00 AM EDT ?Formatting of this note is different from the original. ?Subjective:  ? ?Patient ID: Maureen [name] is a 76 y.o. female. ? ?HPI ? ?The following portions of the patient's history were reviewed and updated as appropriate. ? ?This an established patient is here today for: office visit. Here for evaluation of reflux symptoms. Patient reports she had a recent PET scan and has seen Dr. Baruch Gouty since her last office visit.  ? ?Review of Systems  ?Constitutional: Negative for chills and fatigue.  ?Respiratory: Negative for cough.  ? ?Chief Complaint  ?Patient presents with  ? Follow-up  ? ? ?BP 138/62  Pulse 95  Temp 36.4 ?C (97.6 ?F)  Ht 160 cm (5\' 3" )  Wt 68.5 kg (151 lb)  SpO2 97%  BMI 26.75 kg/m?  ? ?Past Medical History:  ?Diagnosis Date  ? Arthritis 2005  ? Cardiac murmur 2005  ? Discoid lupus 2005  ? GERD (gastroesophageal reflux disease)  ? Hypercholesterolemia  ? Malignant neoplasm of left female breast (CMS-HCC) 2011  ? Malignant neoplasm of right female breast (CMS-HCC) 1994  ? Mammographic microcalcification 2011  ? Panic attack  ? Personal history of chemotherapy 1994  ? Personal history of colonic polyps  ? Personal history of tobacco use, presenting hazards to health  ? PUD (peptic ulcer disease)  ? Rectal polyp 2008  ? Skin cancer 2015  ?basal cell  ? ? ?Past Surgical History:  ?Procedure Laterality Date  ? VAGINAL HYSTERECTOMY 1977  ? MASTECTOMY SIMPLE Right 1993  ? cardiac catheterization 1996  ? TONSILLECTOMY 1997  ? EGD 2003  ? COLONOSCOPY 2005  ? EGD 2005  ? COLONOSCOPY 2009  ? EGD 2009  ? MASTECTOMY SIMPLE Left 2011  ? APPENDECTOMY 2011  ? BREAST EXCISIONAL BIOPSY Right 12/27/2009  ? EGD 2012  ? EGD 2014  ? COLONOSCOPY 08/05/2014  ? EGD 08/05/2014  ? EGD 07/25/2017  ? COLONOSCOPY 09/29/2019  ?Sessile serrated adenoma/PHx CP/Repeat 74yrs/JWB  ? EGD 09/29/2019  ?Reflux esophagitis/No repeat needed at this time/JWB  ? ? ?OB History  ?Gravida   ?2  ?Para  ?2  ?Term  ? ?Preterm  ? ?AB  ? ?Living  ? ? ?SAB  ? ?IAB  ? ?Ectopic  ? ?Molar  ? ?Multiple  ? ?Live Births  ? ? ? ?Obstetric Comments  ?Age at first period 64 ?Age of first pregnancy 10 ? ? ? ? ?Social History  ? ?Socioeconomic History  ? Marital status: Divorced  ?Tobacco Use  ? Smoking status: Every Day  ?Packs/day: 0.75  ?Years: 55.00  ?Pack years: 41.25  ?Types: Cigarettes  ? Smokeless tobacco: Never  ? ? ?Allergies  ?Allergen Reactions  ? Codeine Itching  ? Erythromycin Rash  ? Penicillin Other (See Comments)  ?Break out  ? Sulfa (Sulfonamide Antibiotics) Other (See Comments)  ?Break out  ? ?Current Outpatient Medications  ?Medication Sig Dispense Refill  ? ALPRAZolam (XANAX) 0.5 MG tablet Take 0.5 mg by mouth nightly as needed for Sleep.  ? ascorbic acid, vitamin C, (VITAMIN C) 100 MG tablet Take 100 mg by mouth once daily  ? budesonide-formoterol (SYMBICORT) 160-4.5 mcg/actuation inhaler Inhale 2 inhalations into the lungs 2 (two) times daily  ? cetirizine (ZYRTEC) 10 MG tablet Take 10 mg by mouth once daily  ? cholecalciferol (VITAMIN D3) 2,000 unit tablet Take by mouth  ? hydroxychloroquine (PLAQUENIL) 200 mg tablet Take 200 mg by mouth once daily  ? krill oil  500 mg Cap Take by mouth once daily  ? multivit-min/iron/folic acid/K (ADULTS MULTIVITAMIN ORAL) Take by mouth once daily  ? omeprazole (PRILOSEC) 20 MG DR capsule Take 20 mg by mouth once daily.  ? umeclidinium (INCRUSE ELLIPTA) 62.5 mcg/actuation DsDv inhalation unit Inhale 1 inhalation into the lungs once daily  ? VITAMIN D3 ORAL Take by mouth once daily  ? vitamin E, dl,tocopheryl acet, (VITAMIN E, DL, ACETATE,) 100 unit capsule Take by mouth once daily  ? zinc gluconate 50 mg tablet Take by mouth once daily  ? fluticasone (FLONASE) 50 mcg/actuation nasal spray Place 2 sprays into both nostrils once daily. (Patient not taking: Reported on 07/06/2021)  ? predniSONE (DELTASONE) 10 MG tablet TAKE 1 TABLET BY MOUTH EVERY DAY (Patient not  taking: Reported on 07/06/2021) 21 tablet 0  ? ?No current facility-administered medications for this visit.  ? ?Family History  ?Problem Relation Age of Onset  ? Esophageal cancer Father  ? Breast cancer Maternal Grandmother  ? Breast cancer Maternal Aunt  ? ?Labs and Radiology:  ? ?Jul 22, 2021 PET/CT partial results: ? ?7. Increased uptake within the distal esophagus is noted, ?nonspecific and may be related to esophagitis. ? ?Addendum: ? ?10. There are bilateral indeterminate solid-appearing adnexal ?masses. These are indeterminate. There is no mention of this finding ?on remote pelvic ultrasound from 11/17/2008. Initial evaluation with ?pelvic sonogram is recommended. Alternatively contrast enhanced ?pelvic MRI may provide a more definitive characterization. ? ?Review of the referenced 2010 ultrasound showed no adnexal masses. Status post hysterectomy. ? ?Jul 25, 2017 upper endoscopy: ? ?Scattered areas of salmon-colored mucosa at 15 cm extending 2 cm in length. 6 mm polyp 18 cm from the incisors. 10 mm polyp at the GE junction. ? ?DIAGNOSIS:  ?A.  STOMACH, ANTRUM; COLD BIOPSY:  ?- MILD REACTIVE GASTROPATHY.  ?- NEGATIVE FOR ACTIVE INFLAMMATION, INTESTINAL METAPLASIA, DYSPLASIA,  ?AND MALIGNANCY.  ?- NEGATIVE FOR HELICOBACTER PYLORI IN HEMATOXYLIN AND EOSIN SECTIONS.  ? ?B.  GASTROESOPHAGEAL JUNCTION POLYP X 1; COLD BIOPSY:  ?- SQUAMOCOLUMNAR MUCOSA WITH ACTIVE INFLAMMATION CONSISTENT WITH REFLUX  ?ESOPHAGITIS.  ?- REACTIVE FOVEOLAR HYPERPLASIA.  ?- NEGATIVE FOR GOBLET CELLS, DYSPLASIA, AND MALIGNANCY.  ? ?C.  ESOPHAGUS, 17 CM; COLD BIOPSY:  ?- SQUAMOUS PAPILLOMA.  ?- NEGATIVE FOR DYSPLASIA AND MALIGNANCY.  ? ?D.  ESOPHAGUS, 15 CM; COLD BIOPSY:  ?- HETEROTOPIC GASTRIC MUCOSA CONSISTENT WITH INLET PATCH.  ?- NEGATIVE FOR GOBLET CELLS, DYSPLASIA AND MALIGNANCY.  ? ?Objective:  ?Physical Exam ?Constitutional:  ?Appearance: Normal appearance.  ?Cardiovascular:  ?Rate and Rhythm: Normal rate and regular rhythm.   ?Pulses: Normal pulses.  ?Heart sounds: Normal heart sounds.  ?Pulmonary:  ?Effort: Pulmonary effort is normal.  ?Breath sounds: Normal breath sounds.  ?Musculoskeletal:  ?Cervical back: Neck supple.  ?Skin: ?General: Skin is warm and dry.  ?Neurological:  ?Mental Status: She is alert and oriented to person, place, and time.  ?Psychiatric:  ?Mood and Affect: Mood normal.  ?Behavior: Behavior normal.  ? ? ?Assessment:  ? ?Lung nodule less prominent on recent PET/CT, follow-up low-dose CT scan to schedule III months. Case reviewed with both pulmonology and radiation oncology. ? ?Increased uptake in the distal esophagus, likely longstanding esophagitis. Repeat upper endoscopy indicated due to her long smoking history. ? ?New pelvic adnexal masses. We will review with GYN for further evaluation. ? ?Plan:  ? ?EGD Aug 03, 2021. ? ? ?This note is partially prepared by Karie Fetch, RN, acting as a scribe in the presence of Dr. Dellis Filbert  Bary Castilla, MD.  ?The documentation recorded by the scribe accurately reflects the service I personally performed and the decisions made by me.  ? ?[name], MD FACS ? ?Electronically signed by Mayer Masker, MD at 07/26/2021 9:08 PM EDT ?

## 2021-07-27 ENCOUNTER — Ambulatory Visit: Payer: Medicare HMO | Admitting: Radiation Oncology

## 2021-08-02 ENCOUNTER — Encounter: Payer: Self-pay | Admitting: General Surgery

## 2021-08-03 ENCOUNTER — Ambulatory Visit
Admission: RE | Admit: 2021-08-03 | Discharge: 2021-08-03 | Disposition: A | Discharge status: Home / Self Care | Payer: Medicare HMO | Attending: General Surgery | Admitting: General Surgery

## 2021-08-03 ENCOUNTER — Encounter
Admission: RE | Disposition: A | Discharge status: Home / Self Care | Payer: Self-pay | Source: Home / Self Care | Attending: General Surgery

## 2021-08-03 ENCOUNTER — Ambulatory Visit: Payer: Medicare HMO | Admitting: Certified Registered"

## 2021-08-03 DIAGNOSIS — F17210 Nicotine dependence, cigarettes, uncomplicated: Secondary | ICD-10-CM | POA: Diagnosis not present

## 2021-08-03 DIAGNOSIS — R933 Abnormal findings on diagnostic imaging of other parts of digestive tract: Secondary | ICD-10-CM | POA: Insufficient documentation

## 2021-08-03 DIAGNOSIS — Z8711 Personal history of peptic ulcer disease: Secondary | ICD-10-CM | POA: Insufficient documentation

## 2021-08-03 DIAGNOSIS — Z85828 Personal history of other malignant neoplasm of skin: Secondary | ICD-10-CM | POA: Insufficient documentation

## 2021-08-03 DIAGNOSIS — J449 Chronic obstructive pulmonary disease, unspecified: Secondary | ICD-10-CM | POA: Diagnosis not present

## 2021-08-03 DIAGNOSIS — Z86010 Personal history of colonic polyps: Secondary | ICD-10-CM | POA: Insufficient documentation

## 2021-08-03 DIAGNOSIS — Z9013 Acquired absence of bilateral breasts and nipples: Secondary | ICD-10-CM | POA: Diagnosis not present

## 2021-08-03 DIAGNOSIS — K2100 Gastro-esophageal reflux disease with esophagitis, without bleeding: Secondary | ICD-10-CM | POA: Insufficient documentation

## 2021-08-03 HISTORY — PX: ESOPHAGOGASTRODUODENOSCOPY (EGD) WITH PROPOFOL: SHX5813

## 2021-08-03 SURGERY — ESOPHAGOGASTRODUODENOSCOPY (EGD) WITH PROPOFOL
Anesthesia: General

## 2021-08-03 MED ORDER — SODIUM CHLORIDE 0.9 % IV SOLN
INTRAVENOUS | Status: DC
  Administered 2021-08-03: 20 mL/h via INTRAVENOUS

## 2021-08-03 MED ORDER — PROPOFOL 500 MG/50ML IV EMUL
INTRAVENOUS | Status: AC
  Filled 2021-08-03: qty 50

## 2021-08-03 MED ORDER — LIDOCAINE HCL (CARDIAC) PF 100 MG/5ML IV SOSY
PREFILLED_SYRINGE | INTRAVENOUS | Status: DC | PRN
  Administered 2021-08-03: 50 mg via INTRAVENOUS

## 2021-08-03 MED ORDER — PROPOFOL 10 MG/ML IV BOLUS
INTRAVENOUS | Status: DC | PRN
  Administered 2021-08-03: 20 mg via INTRAVENOUS
  Administered 2021-08-03: 50 mg via INTRAVENOUS

## 2021-08-03 NOTE — H&P (Signed)
Maureen [name] ?[id number] ?1945-07-11 ? ?  ? ?HPI:  76 y/o with abnormal PET scan.  For EGD. ? ?Medications Prior to Admission  ?Medication Sig Dispense Refill Last Dose  ? acetaminophen (TYLENOL) 500 MG tablet Take 500 mg by mouth every 6 (six) hours as needed for headache.   08/02/2021  ? albuterol (PROVENTIL) (2.5 MG/3ML) 0.083% nebulizer solution Take 3 mLs (2.5 mg total) by nebulization every 6 (six) hours as needed for wheezing or shortness of breath. 150 mL 1 08/02/2021  ? albuterol (VENTOLIN HFA) 108 (90 Base) MCG/ACT inhaler Inhale 1-2 puffs into the lungs every 6 (six) hours as needed for wheezing or shortness of breath. 18 each 6 08/02/2021  ? ALPRAZolam (XANAX) 0.5 MG tablet Take 0.5 mg by mouth at bedtime as needed for anxiety.   08/03/2021 at 0700  ? Ascorbic Acid (VITAMIN C) 100 MG tablet Take 100 mg by mouth daily.   08/02/2021  ? azithromycin (ZITHROMAX) 250 MG tablet Take by mouth daily. For 21 days  filled 05/06/21   08/02/2021  ? budesonide-formoterol (SYMBICORT) 160-4.5 MCG/ACT inhaler Inhale 2 puffs into the lungs 2 (two) times daily.   08/02/2021  ? Calcium-Vitamins C & D (CALCIUM/C/D PO) Take by mouth daily.   08/02/2021  ? cetirizine (ZYRTEC) 10 MG tablet Take 10 mg by mouth daily.   08/02/2021  ? Cholecalciferol (VITAMIN D3) 50 MCG (2000 UT) TABS Take by mouth.   08/02/2021  ? clobetasol cream (TEMOVATE) 6.01 % Apply 1 application topically daily.   08/02/2021  ? Homeopathic Products (ZICAM ALLERGY RELIEF NA) Place 2 sprays into the nose daily as needed (allergies).    08/02/2021  ? hydroxychloroquine (PLAQUENIL) 200 MG tablet Take by mouth daily.   08/02/2021  ? KRILL OIL PO Take by mouth daily.   08/02/2021  ? Multiple Vitamin (MULTIVITAMIN PO) Take by mouth daily.   08/02/2021  ? omeprazole (PRILOSEC) 20 MG capsule Take 1 capsule (20 mg total) by mouth daily. 90 capsule 1 08/02/2021  ? predniSONE (DELTASONE) 10 MG tablet Take 10 mg by mouth daily. Take for 21 days, Filled 05/06/21   Past Month  ?  umeclidinium bromide (INCRUSE ELLIPTA) 62.5 MCG/ACT AEPB Inhale 1 puff into the lungs daily. 30 each 6 08/02/2021  ? vitamin E 100 UNIT capsule Take by mouth daily.   08/02/2021  ? zinc gluconate 50 MG tablet Take 50 mg by mouth every other day.   08/02/2021  ? montelukast (SINGULAIR) 10 MG tablet TAKE 1 TABLET BY MOUTH EVERYDAY AT BEDTIME (Patient not taking: Reported on 05/25/2021) 90 tablet 1   ? ?Allergies  ?Allergen Reactions  ? Codeine Itching  ? Sulfa Antibiotics Hives  ? Sulfinpyrazone   ? Erythromycin Rash  ? Penicillins Rash  ? ?Past Medical History:  ?Diagnosis Date  ? Abdominal aortic aneurysm (AAA) without rupture ([place])   ? Arthritis 2005  ? Breast cancer, left breast ([place]) 2011  ? 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 This was a histologic grade 2 tumor measuring just under 1 cm in diameter. No invasive cancer was identified. ER 90%, PR 40%.  ? Breast cancer, right breast ([place]) 1994  ? Invasive ductal carcinoma.The patient underwent right mastectomy in 1994 for invasive ductal carcinoma.  ? COPD (chronic obstructive pulmonary disease) ([place])   ? Discoid lupus 2005  ? Discoid lupus 2005  ? Emphysematous bleb (HCC)   ? GERD (gastroesophageal reflux disease)   ? Heart murmur 2005  ? Hypercholesterolemia  2012  ? Mammographic microcalcification 2011  ? Panic attack   ? Personal history of chemotherapy 1994  ? Personal history of colonic polyps   ? Colon polyps  ? Personal history of other malignant neoplasm of skin 2015  ? basal cell  ? Personal history of tobacco use, presenting hazards to health   ? PUD (peptic ulcer disease)   ? Last EGD 1/09  ? Pulmonary nodule   ? Rectal polyp 2008  ? villous adenoma; last colonoscopy 1/09 WNL  ? Skin cancer, basal cell 2015  ? right cheek [facility]  ? Tobacco use   ? Ulcer   ? ?Past Surgical History:  ?Procedure Laterality Date  ? APPENDECTOMY  2011  ? BREAST BIOPSY  12/27/2009  ? right breast  ? [place]   ? Negative Cath  ? COLONOSCOPY N/A 08/05/2014  ? Procedure: COLONOSCOPY;  Surgeon: [name], MD;  Location: [facility] ENDOSCOPY;  EGD, tubular adenoma ?1.  ? COLONOSCOPY W/ BIOPSIES  2005,2009  ? Dr. Bary Castilla. Normal exam 2009. Villous adenoma of the rectum in 2005 adenomatous polyp from the ascending colon and rectum in 2002.  ? ESOPHAGOGASTRODUODENOSCOPY N/A 08/05/2014  ? Procedure: ESOPHAGOGASTRODUODENOSCOPY (EGD);  Surgeon: [name], MD;  Location: [facility] ENDOSCOPY;  Service: Endoscopy;  Laterality: N/A;  ? ESOPHAGOGASTRODUODENOSCOPY (EGD) WITH PROPOFOL N/A 07/25/2017  ? Procedure: ESOPHAGOGASTRODUODENOSCOPY (EGD) WITH PROPOFOL;  Surgeon: [name], MD;  Location: [facility] ENDOSCOPY;  Service: Endoscopy;  Laterality: N/A;  ? MASTECTOMY    ? bilateral  ? SIMPLE MASTECTOMY  1993  ? right  ? SIMPLE MASTECTOMY  2011  ? left  ? TONSILLECTOMY  1997  ? UPPER GI ENDOSCOPY  2003, 2005, 2009, 2012, 2014  ? 2012 showed mild reflux changes without evidence of Barrett's epithelial changes 2014 biopsies at 37 cm showed changes suggestive of reflux esophagitis. No metaplasia.  ? VAGINAL HYSTERECTOMY  1977  ? ?Social History  ? ?Socioeconomic History  ? Marital status: Widowed  ?  Spouse name: Not on file  ? Number of children: Not on file  ? Years of education: Not on file  ? Highest education level: Not on file  ?Occupational History  ? Occupation: Physicist, medical  ?  Employer: [employer]  ?  Comment: [employer]  ?Tobacco Use  ? Smoking status: Every Day  ?  Packs/day: 0.50  ?  Years: 40.00  ?  Pack years: 20.00  ?  Types: Cigarettes  ? Smokeless tobacco: Never  ? Tobacco comments:  ?  Pt wants to do hypnotizing  ?Vaping Use  ? Vaping Use: Never used  ?Substance and Sexual Activity  ? Alcohol use: Not Currently  ?  Alcohol/week: 1.0 standard drink  ?  Types: 1 Cans of beer per week  ? Drug use: No  ? Sexual activity: Not Currently  ?Other Topics Concern  ? Not on file  ?Social  History Narrative  ? Maureen [name] grew up in [place]. She has two adult daughters. She lives in her home. Her youngest daughter and grandchildren live with her. She has two dogs. 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 She loves watching her grandson play basketball. She also loves gardening and being outdoors and working in the yard.   ? ?Social Determinants of Health  ? ?Financial Resource Strain: Not on file  ?Food Insecurity: Not on file  ?Transportation Needs: Not on file  ?Physical Activity:  Not on file  ?Stress: Not on file  ?Social Connections: Not on file  ?Intimate Partner Violence: Not on file  ? ?Social History  ? ?Social History Narrative  ? Maureen [name] grew up in [place]. She has two adult daughters. She lives in her home. Her youngest daughter and grandchildren live with her. She has two dogs. 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 She loves watching her grandson play basketball. She also loves gardening and being outdoors and working in the yard.   ? ? ? ?ROS: Negative.  ? ? ? ?PE: ?HEENT: Negative. ?Lungs: Clear. ?Cardio: RR. ? ? ?Assessment/Plan: ? ?Proceed with planned endoscopy.  ?[name] ?08/03/2021 ?  ?

## 2021-08-03 NOTE — Transfer of Care (Signed)
Immediate Anesthesia Transfer of Care Note ? ?Patient: BRIGITT MCCLISH ? ?Procedure(s) Performed: ESOPHAGOGASTRODUODENOSCOPY (EGD) WITH PROPOFOL ? ?Patient Location: PACU and Endoscopy Unit ? ?Anesthesia Type:General ? ?Level of Consciousness: awake, drowsy and patient cooperative ? ?Airway & Oxygen Therapy: Patient Spontanous Breathing ? ?Post-op Assessment: Report given to RN and Post -op Vital signs reviewed and stable ? ?Post vital signs: Reviewed and stable ? ?Last Vitals:  ?Vitals Value Taken Time  ?BP 121/62   ?Temp    ?Pulse 72 08/03/21 0840  ?Resp 18 08/03/21 0840  ?SpO2 98 % 08/03/21 0840  ?Vitals shown include unvalidated device data. ? ?Last Pain:  ?Vitals:  ? 08/03/21 0758  ?TempSrc: Temporal  ?PainSc: 0-No pain  ?   ? ?  ? ?Complications: No notable events documented. ?

## 2021-08-03 NOTE — Anesthesia Procedure Notes (Signed)
Procedure Name: [place] ?Date/Time: 08/03/2021 8:26 AM ?Performed by: Jerrye Noble, CRNA ?Pre-anesthesia Checklist: Patient identified, Emergency Drugs available, Suction available and Patient being monitored ?Patient Re-evaluated:Patient Re-evaluated prior to induction ?Oxygen Delivery Method: Nasal cannula ? ? ? ? ?

## 2021-08-03 NOTE — Op Note (Signed)
[facility] ?Gastroenterology ?Patient Name: Maureen [name] ?Procedure Date: 08/03/2021 8:09 AM ?MRN: [id number] ?Account #: [id number] ?Date of Birth: 1945/06/18 ?Admit Type: Outpatient ?Age: 76 ?Room: [facility] ENDO ROOM 1 ?Gender: Female ?Note Status: Finalized ?Instrument Name: Upper Endoscope [id number] ?Procedure:             Upper GI endoscopy ?Indications:           Abnormal PET scan of the GI tract ?Providers:             [name], MD ?Medicines:             Propofol per Anesthesia ?Complications:         No immediate complications. ?Procedure:             Pre-Anesthesia Assessment: ?                       - Prior to the procedure, a History and Physical was  ?                       performed, and patient medications, allergies and  ?                       sensitivities were reviewed. The patient's tolerance  ?                       of previous anesthesia was reviewed. ?                       - The risks and benefits of the procedure and the  ?                       sedation options and risks were discussed with the  ?                       patient. All questions were answered and informed  ?                       consent was obtained. ?                       After obtaining informed consent, the endoscope was  ?                       passed under direct vision. Throughout the procedure,  ?                       the patient's blood pressure, pulse, and oxygen  ?                       saturations were monitored continuously. The Endoscope  ?                       was introduced through the mouth, and advanced to the  ?                       second part of duodenum. The upper GI endoscopy was  ?                       accomplished without difficulty. The patient tolerated  ?  the procedure well. ?Findings: ?     LA Grade A (one or more mucosal breaks less than 5 mm, not extending  ?     between tops of 2 mucosal folds) esophagitis with no bleeding was found  ?     38 to  39 cm from the incisors. Biopsies were taken with a cold forceps  ?     for histology. Biopsies were taken with a cold forceps for histology. ?     The stomach was normal. ?     The examined duodenum was normal. ?Impression:            - LA Grade A reflux esophagitis with no bleeding.  ?                       Biopsied. ?                       - Normal stomach. ?                       - Normal examined duodenum. ?Recommendation:        - Telephone endoscopist for pathology results in 1  ?                       week. ?Procedure Code(s):     --- Professional --- ?                       [phone], Esophagogastroduodenoscopy, flexible,  ?                       transoral; with biopsy, single or multiple ?Diagnosis Code(s):     --- Professional --- ?                       K21.00, Gastro-esophageal reflux disease with  ?                       esophagitis, without bleeding ?                       R93.3, Abnormal findings on diagnostic imaging of  ?                       other parts of digestive tract ?CPT copyright 2019 American Medical Association. All rights reserved. ?The codes documented in this report are preliminary and upon coder review may  ?be revised to meet current compliance requirements. ?[name], MD ?08/03/2021 8:38:56 AM ?This report has been signed electronically. ?Number of Addenda: 0 ?Note Initiated On: 08/03/2021 8:09 AM ?     [facility] ?

## 2021-08-03 NOTE — Anesthesia Preprocedure Evaluation (Signed)
Anesthesia Evaluation  ?Patient identified by MRN, date of birth, ID band ?Patient awake ? ? ? ?Reviewed: ?Allergy & Precautions, H&P , NPO status , Patient's Chart, lab work & pertinent test results, reviewed documented beta blocker date and time  ? ?Airway ?Mallampati: II ? ? ?Neck ROM: full ? ? ? Dental ? ?(+) Poor Dentition ?  ?Pulmonary ?COPD,  COPD inhaler, Current Smoker and Patient abstained from smoking.,  ?  ?Pulmonary exam normal ? ? ? ? ? ? ? Cardiovascular ?Exercise Tolerance: Poor ?Normal cardiovascular exam+ Valvular Problems/Murmurs  ?Rhythm:regular Rate:Normal ? ? ?  ?Neuro/Psych ?Anxiety negative neurological ROS ? negative psych ROS  ? GI/Hepatic ?Neg liver ROS, PUD, GERD  Medicated,  ?Endo/Other  ?negative endocrine ROS ? Renal/GU ?negative Renal ROS  ?negative genitourinary ?  ?Musculoskeletal ? ? Abdominal ?  ?Peds ? Hematology ?negative hematology ROS ?(+)   ?Anesthesia Other Findings ?Past Medical History: ?No date: Abdominal aortic aneurysm (AAA) without rupture ([place]) ?2005: Arthritis ?2011: Breast cancer, left breast ([place]) ?    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 This was a histologic grade 2 tumor measuring  ?             just under 1 cm in diameter. No invasive cancer was  ?             identified. ER 90%, PR 40%. ?1994: Breast cancer, right breast ([place]) ?    Comment:  Invasive ductal carcinoma.The patient underwent right  ?             mastectomy in 1994 for invasive ductal carcinoma. 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    Comment:  Colon polyps ?2015: Personal  history of other malignant neoplasm of skin ?    Comment:  basal cell ?No date: Personal history of tobacco use, presenting hazards to health ?No date: PUD (peptic ulcer disease) ?    Comment:  Last EGD 1/09 ?No date: Pulmonary nodule ?2008: Rectal polyp ?    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    Comment:  right cheek [facility] surgery at [facility] ?No date: Tobacco use ?No date: Ulcer ?Past Surgical History: ?2011: APPENDECTOMY ?12/27/2009: BREAST BIOPSY ?    Comment:  right breast ?1996: CARDIAC CATHETERIZATION ?    Comment:  Negative Cath ?08/05/2014: COLONOSCOPY; N/A ?    Comment:  Procedure: COLONOSCOPY;  Surgeon: [name], MD; ?             Location: [facility] ENDOSCOPY;  EGD, tubular adenoma ?1. ?[id number],3419: COLONOSCOPY W/ BIOPSIES ?    Comment:  Dr. Bary Castilla. Normal exam 2009. Villous adenoma of the  ?             rectum in 2005 adenomatous polyp from the ascending colon ?             and rectum in 2002. ?08/05/2014: ESOPHAGOGASTRODUODENOSCOPY; N/A ?    Comment:  Procedure: ESOPHAGOGASTRODUODENOSCOPY (EGD);  Surgeon:  ?             [name], MD;  Location: [facility] ENDOSCOPY;   ?  Service: Endoscopy;  Laterality: N/A; ?07/25/2017: ESOPHAGOGASTRODUODENOSCOPY (EGD) WITH PROPOFOL; N/A ?    Comment:  Procedure: ESOPHAGOGASTRODUODENOSCOPY (EGD) WITH  ?             PROPOFOL;  Surgeon: [name], MD;  Location:  ?             [place] ENDOSCOPY;  Service: Endoscopy;  Laterality: N/A; ?No date: MASTECTOMY ?    Comment:  bilateral ?1993: SIMPLE MASTECTOMY ?    Comment:  right ?2011: SIMPLE MASTECTOMY ?    Comment:  left ?1997: TONSILLECTOMY ?2003, 2005, 2009, 2012, 2014: UPPER GI ENDOSCOPY ?    Comment:  2012 showed mild reflux changes without evidence of  ?             Barrett's epithelial changes 2014 biopsies at 37 cm  ?             showed changes suggestive of reflux esophagitis. No  ?             metaplasia. ?1977: VAGINAL HYSTERECTOMY ?BMI    ? Body Mass Index: 26.57 kg/m?  ?  ? Reproductive/Obstetrics ?negative OB ROS ? ?  ? ? ? ? ? ? ? ? ? ? ? ? ? ?  ?  ? ? ? ? ? ? ? ? ?Anesthesia Physical ?Anesthesia Plan ? ?ASA: 3 ? ?Anesthesia Plan: General  ? ?Post-op Pain Management:   ? ?Induction:  ? ?PONV Risk Score and Plan:  ? ?Airway Management Planned:  ? ?Additional Equipment:  ? ?Intra-op Plan:  ? ?Post-operative Plan:  ? ?Informed Consent: I have reviewed the patients History and Physical, chart, labs and discussed the procedure including the risks, benefits and alternatives for the proposed anesthesia with the patient or authorized representative who has indicated his/her understanding and acceptance.  ? ? ? ?Dental Advisory Given ? ?Plan Discussed with: CRNA ? ?Anesthesia Plan Comments:   ? ? ? ? ? ? ?Anesthesia Quick Evaluation ? ?

## 2021-08-04 LAB — SURGICAL PATHOLOGY

## 2021-08-04 NOTE — Anesthesia Postprocedure Evaluation (Signed)
Anesthesia Post Note  Patient: Maureen [name]  Procedure(s) Performed: ESOPHAGOGASTRODUODENOSCOPY (EGD) WITH PROPOFOL  Patient location during evaluation: PACU Anesthesia Type: General Level of consciousness: awake and alert Pain management: pain level controlled Vital Signs Assessment: post-procedure vital signs reviewed and stable Respiratory status: spontaneous breathing, nonlabored ventilation, respiratory function stable and patient connected to nasal cannula oxygen Cardiovascular status: blood pressure returned to baseline and stable Postop Assessment: no apparent nausea or vomiting Anesthetic complications: no   No notable events documented.   Last Vitals:  Vitals:   08/03/21 0900 08/03/21 0910  BP: (!) 147/81 140/88  Pulse: 64 64  Resp: 18 18  Temp:    SpO2: 100% 100%    Last Pain:  Vitals:   08/04/21 0750  TempSrc:   PainSc: 0-No pain                 Molli Barrows

## 2021-08-05 ENCOUNTER — Encounter: Payer: Self-pay | Admitting: General Surgery

## 2021-08-08 ENCOUNTER — Encounter: Payer: Self-pay | Admitting: Family Medicine

## 2021-08-08 ENCOUNTER — Ambulatory Visit: Payer: Medicare HMO | Admitting: Family Medicine

## 2021-08-08 VITALS — BP 115/71 | HR 78 | Temp 97.7°F | Wt 153.8 lb

## 2021-08-08 DIAGNOSIS — E782 Mixed hyperlipidemia: Secondary | ICD-10-CM | POA: Diagnosis not present

## 2021-08-08 DIAGNOSIS — F411 Generalized anxiety disorder: Secondary | ICD-10-CM

## 2021-08-08 DIAGNOSIS — E559 Vitamin D deficiency, unspecified: Secondary | ICD-10-CM | POA: Diagnosis not present

## 2021-08-08 DIAGNOSIS — J449 Chronic obstructive pulmonary disease, unspecified: Secondary | ICD-10-CM

## 2021-08-08 MED ORDER — BUDESONIDE-FORMOTEROL FUMARATE 160-4.5 MCG/ACT IN AERO: 2.0000 | INHALATION_SPRAY | Freq: Two times a day (BID) | RESPIRATORY_TRACT | 3 refills | Status: DC

## 2021-08-08 MED ORDER — ALPRAZOLAM 0.5 MG PO TABS: 0.5000 mg | ORAL_TABLET | Freq: Two times a day (BID) | ORAL | 1 refills | Status: DC | PRN

## 2021-08-08 MED ORDER — ALBUTEROL SULFATE HFA 108 (90 BASE) MCG/ACT IN AERS: 1.0000 | INHALATION_SPRAY | Freq: Four times a day (QID) | RESPIRATORY_TRACT | 6 refills | Status: DC | PRN

## 2021-08-08 MED ORDER — OMEPRAZOLE 20 MG PO CPDR: 20.0000 mg | DELAYED_RELEASE_CAPSULE | Freq: Every day | ORAL | 1 refills | Status: DC

## 2021-08-08 MED ORDER — MONTELUKAST SODIUM 10 MG PO TABS
ORAL_TABLET | ORAL | 1 refills | Status: DC
Start: 2021-08-08 — End: 2022-02-08

## 2021-08-08 NOTE — Progress Notes (Signed)
BP 115/71   Pulse 78   Temp 97.7 F (36.5 C)   Wt 153 lb 12.8 oz (69.8 kg)   SpO2 97%   BMI 27.24 kg/m    Subjective:    Patient ID: Maureen [name], female    DOB: [birth year], 76 y.o.   MRN: [id number]  HPI: Maureen [name] is a 76 y.o. female  Chief Complaint  Patient presents with   Hyperlipidemia   Anxiety   COPD    Patient states incruse inhaler makes her mouth sore, would like an alternative. Patient states she did not like trellegy either and insurance won't cover symbicort now.    HYPERLIPIDEMIA Hyperlipidemia status: excellent compliance Satisfied with current treatment?  yes Side effects:  no Medication compliance: excellent compliance Past cholesterol meds: none Supplements: krill oil Aspirin:  no The 10-year ASCVD risk score (Arnett DK, et al., 2019) is: 19%   Values used to calculate the score:     Age: 86 years     Sex: Female     Is Non-Hispanic African American: No     Diabetic: No     Tobacco smoker: Yes     Systolic Blood Pressure: 170 mmHg     Is BP treated: No     HDL Cholesterol: 47 mg/dL     Total Cholesterol: 233 mg/dL Chest pain:  no Coronary artery disease:  no  COPD COPD status: controlled Satisfied with current treatment?: no Oxygen use: no Dyspnea frequency: occasionally Cough frequency: occasionally Rescue inhaler frequency:  occasionally Limitation of activity: no Pneumovax: Up to Date Influenza: Up to Date  ANXIETY/STRESS Duration: chronic Status:stable Anxious mood: yes  Excessive worrying: yes Irritability: yes  Sweating: no Nausea: no Palpitations:no Hyperventilation: no Panic attacks: no Agoraphobia: no  Obscessions/compulsions: no Depressed mood: no    08/08/2021    2:01 PM 01/19/2021    9:21 AM 01/06/2020    2:55 PM 10/05/2019   11:05 AM 07/02/2019   10:04 AM  Depression screen PHQ 2/9  Decreased Interest 0 0 2 2 0  Down, Depressed, Hopeless 0 0 2 2 1   PHQ - 2 Score 0 0 4 4 1   Altered sleeping 1 0 0  2 0  Tired, decreased energy 1 0 0 2 0  Change in appetite 1 1 0 2 1  Feeling bad or failure about yourself  1 0 0 0 0  Trouble concentrating 1 0 0 2 0  Moving slowly or fidgety/restless 0 0 0 2 1  Suicidal thoughts 0 0 0 0 0  PHQ-9 Score 5 1 4 14 3   Difficult doing work/chores  Not difficult at all Not difficult at all Somewhat difficult    Anhedonia: no Weight changes: no Insomnia: no   Hypersomnia: no Fatigue/loss of energy: yes Feelings of worthlessness: no Feelings of guilt: no Impaired concentration/indecisiveness: no Suicidal ideations: no  Crying spells: no Recent Stressors/Life Changes: no   Relationship problems: no   Family stress: no     Financial stress: no    Job stress: no    Recent death/loss: no   Relevant past medical, surgical, family and social history reviewed and updated as indicated. Interim medical history since our last visit reviewed. Allergies and medications reviewed and updated.  Review of Systems  Constitutional: Negative.   Respiratory: Negative.    Cardiovascular: Negative.   Gastrointestinal: Negative.   Musculoskeletal: Negative.   Neurological: Negative.   Psychiatric/Behavioral: Negative.     Per HPI unless specifically indicated  above     Objective:    BP 115/71   Pulse 78   Temp 97.7 F (36.5 C)   Wt 153 lb 12.8 oz (69.8 kg)   SpO2 97%   BMI 27.24 kg/m   Wt Readings from Last 3 Encounters:  08/08/21 153 lb 12.8 oz (69.8 kg)  08/03/21 150 lb (68 kg)  05/25/21 (P) 150 lb 4.8 oz (68.2 kg)    Physical Exam Vitals and nursing note reviewed.  Constitutional:      General: She is not in acute distress.    Appearance: Normal appearance. She is not ill-appearing, toxic-appearing or diaphoretic.  HENT:     Head: Normocephalic and atraumatic.     Right Ear: External ear normal.     Left Ear: External ear normal.     Nose: Nose normal.     Mouth/Throat:     Mouth: Mucous membranes are moist.     Pharynx: Oropharynx is  clear.  Eyes:     General: No scleral icterus.       Right eye: No discharge.        Left eye: No discharge.     Extraocular Movements: Extraocular movements intact.     Conjunctiva/sclera: Conjunctivae normal.     Pupils: Pupils are equal, round, and reactive to light.  Cardiovascular:     Rate and Rhythm: Normal rate and regular rhythm.     Pulses: Normal pulses.     Heart sounds: Normal heart sounds. No murmur heard.   No friction rub. No gallop.  Pulmonary:     Effort: Pulmonary effort is normal. No respiratory distress.     Breath sounds: Normal breath sounds. No stridor. No wheezing, rhonchi or rales.  Chest:     Chest wall: No tenderness.  Musculoskeletal:        General: Normal range of motion.     Cervical back: Normal range of motion and neck supple.  Skin:    General: Skin is warm and dry.     Capillary Refill: Capillary refill takes less than 2 seconds.     Coloration: Skin is not jaundiced or pale.     Findings: No bruising, erythema, lesion or rash.  Neurological:     General: No focal deficit present.     Mental Status: She is alert and oriented to person, place, and time. Mental status is at baseline.  Psychiatric:        Mood and Affect: Mood normal.        Behavior: Behavior normal.        Thought Content: Thought content normal.        Judgment: Judgment normal.    Results for orders placed or performed during the hospital encounter of 08/03/21  Surgical pathology  Result Value Ref Range   SURGICAL PATHOLOGY      SURGICAL PATHOLOGY CASE: [id number] PATIENT: Maureen [name] Surgical Pathology Report     Specimen Submitted: A. Esophagus, distal, 39 cm; cbx  Clinical History: GERD.  Hiatal hernia      DIAGNOSIS: A. ESOPHAGUS, DISTAL AT 39 CM; COLD BIOPSY: - SQUAMOUS MUCOSA WITH CHANGES CONSISTENT WITH REFLUX. - NEGATIVE FOR DYSPLASIA AND MALIGNANCY.  GROSS DESCRIPTION: A. Labeled: cbx distal esophagus at 39 cm Received:  Formalin Collection time: 8:30 AM on 08/03/2021 Placed into formalin time: 8:30 AM on 08/03/2021 Tissue fragment(s): 4 Size: Aggregate, 1.6 x 0.3 x 0.2 cm Description: White-pink translucent soft tissue fragments Entirely submitted in 1 cassette.  RB 08/03/2021  Final Diagnosis performed by Quay Burow, MD.   Electronically signed 08/04/2021 10:41:32AM The electronic signature indicates that the named Attending Pathologist has evaluated the specimen Technical component performed at [facility], [address] [place], [place] [address] Lab: [phone] Dir: Rush Farmer, MD, MMM  Professional component performed at [facility], [facility], [place], [place], [place] [address] Lab: [phone] Dir: Kathi Simpers, MD       Assessment & Plan:   Problem List Items Addressed This Visit       Respiratory   COPD (chronic obstructive pulmonary disease) with chronic bronchitis ([place])    Under good control on current regimen. Continue current regimen. Continue to monitor. Call with any concerns. Refills given.         Relevant Medications   montelukast (SINGULAIR) 10 MG tablet   albuterol (VENTOLIN HFA) 108 (90 Base) MCG/ACT inhaler   budesonide-formoterol (SYMBICORT) 160-4.5 MCG/ACT inhaler     Other   Generalized anxiety disorder    Under good control on current regimen. Continue current regimen. Continue to monitor. Call with any concerns. Refills given for 3 months. Follow up 3 months.         Relevant Medications   ALPRAZolam (XANAX) 0.5 MG tablet   Hyperlipidemia - Primary    Under good control on current regimen. Continue current regimen. Continue to monitor. Call with any concerns. Refills given. Labs to be drawn shortly.        Relevant Orders   Comprehensive metabolic panel   Lipid Panel w/o Chol/HDL Ratio   Vitamin D deficiency    Will recheck labs. Await results. Treat as needed.        Relevant Orders   VITAMIN D 25 Hydroxy (Vit-D  Deficiency, Fractures)     Follow up plan: Return in about 3 months (around 11/08/2021).

## 2021-08-08 NOTE — Assessment & Plan Note (Signed)
Will recheck labs. Await results. Treat as needed.

## 2021-08-08 NOTE — Assessment & Plan Note (Signed)
Under good control on current regimen. Continue current regimen. Continue to monitor. Call with any concerns. Refills given.   

## 2021-08-08 NOTE — Assessment & Plan Note (Signed)
Under good control on current regimen. Continue current regimen. Continue to monitor. Call with any concerns. Refills given for 3 months. Follow up 3 months.    

## 2021-08-08 NOTE — Assessment & Plan Note (Signed)
Under good control on current regimen. Continue current regimen. Continue to monitor. Call with any concerns. Refills given. Labs to be drawn shortly.  

## 2021-08-29 DIAGNOSIS — N9489 Other specified conditions associated with female genital organs and menstrual cycle: Secondary | ICD-10-CM | POA: Insufficient documentation

## 2021-08-30 ENCOUNTER — Other Ambulatory Visit: Payer: Self-pay | Admitting: *Deleted

## 2021-08-30 ENCOUNTER — Telehealth: Payer: Self-pay

## 2021-08-30 DIAGNOSIS — R911 Solitary pulmonary nodule: Secondary | ICD-10-CM

## 2021-08-30 NOTE — Telephone Encounter (Signed)
Referral received for bilateral adnexal masses. Attempted to call to arrange appointment. Voicemail full/no voicemail. My chart message sent.

## 2021-08-31 ENCOUNTER — Inpatient Hospital Stay: Payer: Medicare HMO | Attending: Obstetrics and Gynecology | Admitting: Obstetrics and Gynecology

## 2021-08-31 ENCOUNTER — Encounter: Payer: Self-pay | Admitting: Obstetrics and Gynecology

## 2021-08-31 ENCOUNTER — Inpatient Hospital Stay: Payer: Medicare HMO

## 2021-08-31 ENCOUNTER — Other Ambulatory Visit: Payer: Self-pay

## 2021-08-31 DIAGNOSIS — D398 Neoplasm of uncertain behavior of other specified female genital organs: Secondary | ICD-10-CM | POA: Diagnosis present

## 2021-08-31 DIAGNOSIS — E785 Hyperlipidemia, unspecified: Secondary | ICD-10-CM | POA: Diagnosis not present

## 2021-08-31 DIAGNOSIS — M329 Systemic lupus erythematosus, unspecified: Secondary | ICD-10-CM | POA: Diagnosis not present

## 2021-08-31 DIAGNOSIS — F411 Generalized anxiety disorder: Secondary | ICD-10-CM | POA: Diagnosis not present

## 2021-08-31 DIAGNOSIS — J449 Chronic obstructive pulmonary disease, unspecified: Secondary | ICD-10-CM | POA: Diagnosis not present

## 2021-08-31 DIAGNOSIS — Z90710 Acquired absence of both cervix and uterus: Secondary | ICD-10-CM | POA: Insufficient documentation

## 2021-08-31 DIAGNOSIS — Z853 Personal history of malignant neoplasm of breast: Secondary | ICD-10-CM | POA: Diagnosis not present

## 2021-08-31 DIAGNOSIS — Z8719 Personal history of other diseases of the digestive system: Secondary | ICD-10-CM | POA: Diagnosis not present

## 2021-08-31 DIAGNOSIS — E559 Vitamin D deficiency, unspecified: Secondary | ICD-10-CM | POA: Insufficient documentation

## 2021-08-31 DIAGNOSIS — I700 Atherosclerosis of aorta: Secondary | ICD-10-CM | POA: Diagnosis not present

## 2021-08-31 DIAGNOSIS — F17210 Nicotine dependence, cigarettes, uncomplicated: Secondary | ICD-10-CM | POA: Diagnosis not present

## 2021-08-31 DIAGNOSIS — N838 Other noninflammatory disorders of ovary, fallopian tube and broad ligament: Secondary | ICD-10-CM

## 2021-08-31 DIAGNOSIS — K219 Gastro-esophageal reflux disease without esophagitis: Secondary | ICD-10-CM | POA: Diagnosis not present

## 2021-08-31 DIAGNOSIS — R918 Other nonspecific abnormal finding of lung field: Secondary | ICD-10-CM | POA: Insufficient documentation

## 2021-08-31 NOTE — H&P (Signed)
Gynecologic Oncology H and P    Referring Provider: Dr Prentice Docker   Chief Concern: ovarian masses   Subjective:  Maureen [name] is a 76 y.o. female who is seen in consultation from Dr. Glennon Mac for solid ovarian masses.   Patient is a 76 year old smoker with history of bilateral breast cancer s/p mastectomy, emphysema COPD abdominal aortic aneurysm.  Patient also has discoid lupus.  She has breast status post bilateral mastectomies also had chemotherapy for her breast cancer.  She was picked up a screening CT scan to have a new solid nodule in the right upper lobe measuring 9.8 mm. Saw Dr Baruch Gouty 3/23 who discussed with Pulmonology.  She is not a suitable candidate for biopsy either with CT-guided fine-needle or navigational bronc.  He believed the best strategy would be to wait another 2 months and perform a PET CT scan.  Should there be hypermetabolic activity would offer SBRT 60 Gray in 5 fractions.     PET/CT IMPRESSION: 1. Bilateral pulmonary nodules are again noted which are all technically too small to reliably characterize by PET-CT. 2. The previously characterized new solid nodule in the left upper lobe measuring 5.8 mm has an SUV max of 1.97. Differential considerations for this degree of uptake include inflammatory/infectious change versus early malignancy. 3. The previously characterized new part solid nodule within the right upper lobe is difficult to visualize on today's study and may have resolved in the interval. 4. Several new small nodules are identified within the right upper lobe, right lower lobe and left upper lobe which are nonspecific and exhibit mild SUV max (less than 1.0). 5. Recommend repeat Lung cancer screening CT in 3 months to assess for temporal change in the appearance of these lung nodules which are likely too small to successfully biopsy. 6. No signs of FDG avid nodal metastasis or distant metastatic disease. 7. Increased uptake within the distal  esophagus is noted, nonspecific and may be related to esophagitis. 8. Infrarenal abdominal aortic aneurysm measures 4.6 cm. Recommend follow-up CT/MR every 6 months and vascular consultation. This recommendation follows ACR consensus guidelines: White Paper of the ACR Incidental Findings Committee II on Vascular Findings. J Am Coll Radiol 2013; 10:789-794. 9. Aortic Atherosclerosis (ICD10-I70.0) and Emphysema (ICD10-J43.9). Coronary artery calcifications.   Within the right adnexa there is a solid-appearing mass measuring 5.4 x 5.2 cm with SUV max 2.44. Within the left adnexa there is a solid-appearing mass measuring 6.5 x 6.9 cm with SUV max of 2.6, image 172/4. The patient is status post hysterectomy   Addendum There are bilateral indeterminate solid-appearing adnexal masses. These are indeterminate. There is no mention of this finding on remote pelvic ultrasound from 11/17/2008. Initial evaluation with pelvic sonogram is recommended. Alternatively contrast enhanced pelvic MRI may provide a more definitive characterization.     She saw Dr Glennon Mac for consult. Pelvic US 08/31/21  Indications: Pelvic masses Findings:  The uterus is absent.  Right Ovary measures not definitively visualized. Left Ovary measures not definitively visualized.  Two masses noted in the pelvic region. Both have mixed echogenicity and both appear to be solid.  There are areas of increased density with possible blood flow in some areas on doppler interrogation.   They measure: Left: 3.58 x 4.39 x 3.23 cm Right: 4.51 x 3.50 x 3.57 cm  There is no free fluid in the cul de sac.   She is asymptomatic with no pelvic pain or bleeding.  Some urinary pressure.  Vaginal hysterectomy in later  20s due to abnormal PAPs     Problem List:     Patient Active Problem List    Diagnosis Date Noted   Atherosclerosis of aortic bifurcation and common iliac arteries ([place]) 10/22/2020   Abdominal aortic aneurysm (AAA) without  rupture ([place]) 11/11/2018   History of breast cancer 11/11/2018   COPD (chronic obstructive pulmonary disease) with chronic bronchitis ([place]) 06/19/2018   Skin lesion of chest wall 06/15/2016   Vitamin D deficiency 05/29/2016   Tobacco abuse 05/25/2016   Controlled substance agreement signed 05/25/2016   Advance care planning 05/25/2016   History of colonic polyps 06/12/2014   Esophageal reflux 06/12/2014   Hyperlipidemia 05/11/2014   Lupus ([place]) 04/16/2013   Generalized anxiety disorder 04/16/2013   Personal history of other malignant neoplasm of skin 03/29/2012      Past Medical History:     Past Medical History:  Diagnosis Date   Abdominal aortic aneurysm (AAA) without rupture ([facility])     Arthritis 2005   Breast cancer, left breast ([place]) 2011    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 This was a histologic grade 2 tumor measuring just under 1 cm in diameter. No invasive cancer was identified. ER 90%, PR 40%.   Breast cancer, right breast ([place]) 1994    Invasive ductal carcinoma.The patient underwent right mastectomy in 1994 for invasive ductal carcinoma.   COPD (chronic obstructive pulmonary disease) ([place])     Discoid lupus 2005   Discoid lupus 2005   Emphysematous bleb ([facility])     GERD (gastroesophageal reflux disease)     Heart murmur 2005   Hypercholesterolemia 2012   Mammographic microcalcification 2011   Panic attack     Personal history of chemotherapy 1994   Personal history of colonic polyps      Colon polyps   Personal history of other malignant neoplasm of skin 2015    basal cell   Personal history of tobacco use, presenting hazards to health     PUD (peptic ulcer disease)      Last EGD 1/09   Pulmonary nodule     Rectal polyp 2008    villous adenoma; last colonoscopy 1/09 WNL   Skin cancer, basal cell 2015    right cheek Mohs surgery at [facility] use     Ulcer        Past Surgical History:      Past Surgical History:   Procedure Laterality Date   APPENDECTOMY   2011   BREAST BIOPSY   12/27/2009    right breast   CARDIAC CATHETERIZATION   1996    Negative Cath   COLONOSCOPY N/A 08/05/2014    Procedure: COLONOSCOPY;  Surgeon: [name], MD;  Location: [facility] ENDOSCOPY;  EGD, tubular adenoma 1.   COLONOSCOPY W/ BIOPSIES   2005,2009    Dr. Bary Castilla. Normal exam 2009. Villous adenoma of the rectum in 2005 adenomatous polyp from the ascending colon and rectum in 2002.   ESOPHAGOGASTRODUODENOSCOPY N/A 08/05/2014    Procedure: ESOPHAGOGASTRODUODENOSCOPY (EGD);  Surgeon: [name], MD;  Location: [facility] ENDOSCOPY;  Service: Endoscopy;  Laterality: N/A;   ESOPHAGOGASTRODUODENOSCOPY (EGD) WITH PROPOFOL N/A 07/25/2017    Procedure: ESOPHAGOGASTRODUODENOSCOPY (EGD) WITH PROPOFOL;  Surgeon: [name], MD;  Location: [facility] ENDOSCOPY;  Service: Endoscopy;  Laterality: N/A;   ESOPHAGOGASTRODUODENOSCOPY (EGD) WITH PROPOFOL N/A 08/03/2021    Procedure: ESOPHAGOGASTRODUODENOSCOPY (EGD) WITH PROPOFOL;  Surgeon: [name], MD;  Location: [facility] ENDOSCOPY;  Service: Endoscopy;  Laterality: N/A;   MASTECTOMY        bilateral   SIMPLE MASTECTOMY   1993    right   SIMPLE MASTECTOMY   2011    left   TONSILLECTOMY   1997   UPPER GI ENDOSCOPY   2003, 2005, 2009, 2012, 2014    2012 showed mild reflux changes without evidence of Barrett's epithelial changes 2014 biopsies at 37 cm showed changes suggestive of reflux esophagitis. No metaplasia.   VAGINAL HYSTERECTOMY   1977      Past Gynecologic History:  Post menopausal.        OB History:                  OB History  Gravida Para Term Preterm AB Living  2 2 2     2   SAB IAB Ectopic Multiple Live Births                      # Outcome Date GA Lbr Len/2nd Weight Sex Delivery Anes PTL Lv  2 Term                    1 Term                       Obstetric Comments  Age with first menstruation-15  Age with first pregnancy-24  LMP-1977, hysterectomy       Family History:      Family History  Problem Relation Age of Onset   Cancer Father          esophagus   Cancer Maternal Aunt          breast cancer   Cancer Maternal Grandmother          breast cancer      Social History: Social History         Socioeconomic History   Marital status: Widowed      Spouse name: Not on file   Number of children: Not on file   Years of education: Not on file   Highest education level: Not on file  Occupational History   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 She has two adult daughters. She lives in her home. Her youngest daughter and grandchildren live with her. She has two dogs. 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 She loves watching her grandson play basketball. She also loves gardening and being outdoors and working in the yard.     Social Determinants of Health    Financial Resource Strain: Not on  file  Food Insecurity: Not on file  Transportation Needs: Not on file  Physical Activity: Not on file  Stress: Not on file  Social Connections: Not on file  Intimate Partner Violence: Not on file      Allergies:      Allergies  Allergen Reactions   Codeine Itching   Incruse Ellipta [Umeclidinium Bromide] Other (See Comments)      Oral sores   Sulfa Antibiotics Hives    Sulfinpyrazone     Erythromycin Rash   Penicillins Rash      Current Medications:       Current Outpatient Medications  Medication Sig Dispense Refill   acetaminophen (TYLENOL) 500 MG tablet Take 500 mg by mouth every 6 (six) hours as needed for headache.       albuterol (PROVENTIL) (2.5 MG/3ML) 0.083% nebulizer solution Take 3 mLs (2.5 mg total) by nebulization every 6 (six) hours as needed for wheezing or shortness of breath. 150 mL 1   albuterol (VENTOLIN HFA) 108 (90 Base) MCG/ACT inhaler Inhale 1-2 puffs into the lungs every 6 (six) hours as needed for wheezing or shortness of breath. 18 each 6   ALPRAZolam (XANAX) 0.5 MG tablet Take 1 tablet (0.5 mg total) by mouth 2 (two) times daily as needed for anxiety. 45 tablet 1   Ascorbic Acid (VITAMIN C) 100 MG tablet Take 100 mg by mouth daily.       budesonide-formoterol (SYMBICORT) 160-4.5 MCG/ACT inhaler Inhale 2 puffs into the lungs 2 (two) times daily. 3 each 3   Calcium-Vitamins C & D (CALCIUM/C/D PO) Take by mouth daily.       cetirizine (ZYRTEC) 10 MG tablet Take 10 mg by mouth daily.       Cholecalciferol (VITAMIN D3) 50 MCG (2000 UT) TABS Take by mouth.       clobetasol cream (TEMOVATE) 7.25 % Apply 1 application topically daily.       Homeopathic Products (ZICAM ALLERGY RELIEF NA) Place 2 sprays into the nose daily as needed (allergies).        hydroxychloroquine (PLAQUENIL) 200 MG tablet Take by mouth daily. (Patient not taking: Reported on 08/08/2021)       KRILL OIL PO Take by mouth daily.       montelukast (SINGULAIR) 10 MG tablet TAKE 1 TABLET BY MOUTH EVERYDAY AT BEDTIME 90 tablet 1   Multiple Vitamin (MULTIVITAMIN PO) Take by mouth daily.       omeprazole (PRILOSEC) 20 MG capsule Take 1 capsule (20 mg total) by mouth daily. 90 capsule 1   vitamin E 100 UNIT capsule Take by mouth daily.       zinc gluconate 50 MG tablet Take 50 mg by mouth every other day.        No current facility-administered medications for this visit.     General: negative for fevers, changes in weight or night sweats Skin: negative for changes in moles or sores or rash Eyes: negative for changes in vision HEENT: negative for change in hearing, tinnitus, voice changes. Positive for seasonal allergies Pulmonary: positive for cough Cardiac: negative for palpitations, pain Gastrointestinal: positive for abdominal pain Genitourinary/Sexual: positive for stress incontinence Ob/Gyn:  positive for pelvic pain Musculoskeletal: negative for pain, joint pain, back pain Hematology: negative for easy bruising, abnormal bleeding Neurologic/Psych: negative for headaches, seizures, paralysis, weakness, numbness. Positive for depression & anxiety   Objective:  Physical Examination:  BP 136/82   Pulse 84   Resp 18   Wt 152 lb (  68.9 kg)   BMI 26.93 kg/m    ECOG Performance Status: 1 - Symptomatic but completely ambulatory   General appearance: alert, cooperative, and appears stated age HEENT:extra ocular movement intact, thyroid without masses, and trachea midline Lymph node survey: non-palpable, axillary, inguinal, supraclavicular Cardiovascular: regular rate and rhythm, no murmurs or gallops Respiratory: normal air entry, lungs clear to auscultation and no rales, rhonchi or wheezing Abdomen: soft, non-tender, without masses or organomegaly, no hernias, and well healed incision Back: inspection of back is normal Extremities: extremities normal, atraumatic, no cyanosis or edema Skin exam - normal coloration and turgor, no rashes, no suspicious skin lesions noted. Neurological exam reveals alert, oriented, normal speech, no focal findings or movement disorder noted.   Pelvic: exam chaperoned by nurse;  Vulva: normal appearing vulva with no masses, tenderness or lesions; Vagina: normal vagina; Adnexa; hard mass palpable above vagina. Rectal: confirms.       Lab Review Recent Labs       Lab Results  Component Value Date    WBC 5.5  01/27/2021    HGB 14.2 01/27/2021    HCT 42.3 01/27/2021    MCV 96 01/27/2021    PLT 174 01/27/2021        Chemistry    Labs (Brief)          Component Value Date/Time    NA 141 01/27/2021 0000    K 4.7 01/27/2021 0000    CL 108 (H) 01/27/2021 0000    CO2 22 01/27/2021 0000    BUN 16 01/27/2021 0000    CREATININE 1.09 (H) 01/27/2021 0000    GLU 92 01/02/2014 0000     Labs (Brief)          Component Value Date/Time    CALCIUM 10.1 01/27/2021 0000    ALKPHOS 89 01/27/2021 0000    AST 22 01/27/2021 0000    ALT 26 01/27/2021 0000    BILITOT 0.3 01/27/2021 0000             Assessment:  Maureen [name] is a 76 y.o. female diagnosed with bilateral 6-7 cm solid ovarian masses on PET/CT done for evaluation of lung nodule.  Indeterminate on PET based on low SUV. Suspect these are benign and differential includes fibroadenoma or sex cord stromal tumors.   Smoker with indeterminate lung nodules.  Considering SBRT for possible lung cancer, but plan for now is to continue close follow up.  She has COPD, but seems fairly functional and is able to climb stairs.    Medical co-morbidities complicating care: COPD, breast cancer, lupus.  Plan:    Problem List Items Addressed This Visit              Other    Bilateral tubo-ovarian mass      We discussed options for management including surgery and she is scheduled for laparoscopic BSO, possible pelvic/aortic LN biopsies, possible laparotomy 10/05/21. Will check pre op labs as well as CEA, CA125 and inhibin.    Will check with pulmonary, but think she is a good surgical candidate.  Walks up stairs with no problems.   The risks of surgery were discussed in detail and she understands these to include infection; wound separation; hernia; vaginal cuff separation, injury to adjacent organs such as bowel, bladder, blood vessels, ureters and nerves; bleeding which may require blood transfusion; anesthesia risk; thromboembolic events;  possible death; unforeseen complications; possible need for re-exploration; medical complications such as heart attack, stroke, pleural effusion and pneumonia;  and, if staging performed the risk of lymphedema and lymphocyst.  The patient will receive DVT and antibiotic prophylaxis as indicated.  She voiced a clear understanding.  She had the opportunity to ask questions and written informed consent was obtained today.   Suggested return to clinic post op.      The patient's diagnosis, an outline of the further diagnostic and laboratory studies which will be required, the recommendation, and alternatives were discussed.  All questions were answered to the patient's satisfaction.   A total of 60 minutes were spent with the patient/family today; 50% was spent in education, counseling and coordination of care for solid ovarian masses.     Mellody Drown, MD     CC:  Will Bonnet, MD [address] [place],  [place] [address] [phone]

## 2021-08-31 NOTE — Progress Notes (Addendum)
Gynecologic Oncology Consult Visit   Referring Provider: Dr Prentice Docker  Chief Concern: ovarian masses  Subjective:  Maureen [name] is a 76 y.o. female who is seen in consultation from Dr. Glennon Mac for solid ovarian masses.  Patient is a 76 year old smoker with history of bilateral breast cancer s/p mastectomy, emphysema COPD abdominal aortic aneurysm.  Patient also has discoid lupus.  She has breast status post bilateral mastectomies also had chemotherapy for her breast cancer.  She was picked up a screening CT scan to have a new solid nodule in the right upper lobe measuring 9.8 mm. Saw Dr Baruch Gouty 3/23 who discussed with Pulmonology.  She is not a suitable candidate for biopsy either with CT-guided fine-needle or navigational bronc.  He believed the best strategy would be to wait another 2 months and perform a PET CT scan.  Should there be hypermetabolic activity would offer SBRT 60 Gray in 5 fractions.    PET/CT IMPRESSION: 1. Bilateral pulmonary nodules are again noted which are all technically too small to reliably characterize by PET-CT. 2. The previously characterized new solid nodule in the left upper lobe measuring 5.8 mm has an SUV max of 1.97. Differential considerations for this degree of uptake include inflammatory/infectious change versus early malignancy. 3. The previously characterized new part solid nodule within the right upper lobe is difficult to visualize on today's study and may have resolved in the interval. 4. Several new small nodules are identified within the right upper lobe, right lower lobe and left upper lobe which are nonspecific and exhibit mild SUV max (less than 1.0). 5. Recommend repeat Lung cancer screening CT in 3 months to assess for temporal change in the appearance of these lung nodules which are likely too small to successfully biopsy. 6. No signs of FDG avid nodal metastasis or distant metastatic disease. 7. Increased uptake within the distal  esophagus is noted, nonspecific and may be related to esophagitis. 8. Infrarenal abdominal aortic aneurysm measures 4.6 cm. Recommend follow-up CT/MR every 6 months and vascular consultation. This recommendation follows ACR consensus guidelines: White Paper of the ACR Incidental Findings Committee II on Vascular Findings. J Am Coll Radiol 2013; 10:789-794. 9. Aortic Atherosclerosis (ICD10-I70.0) and Emphysema (ICD10-J43.9). Coronary artery calcifications.  Within the right adnexa there is a solid-appearing mass measuring 5.4 x 5.2 cm with SUV max 2.44. Within the left adnexa there is a solid-appearing mass measuring 6.5 x 6.9 cm with SUV max of 2.6, image 172/4. The patient is status post hysterectomy  Addendum There are bilateral indeterminate solid-appearing adnexal masses. These are indeterminate. There is no mention of this finding on remote pelvic ultrasound from 11/17/2008. Initial evaluation with pelvic sonogram is recommended. Alternatively contrast enhanced pelvic MRI may provide a more definitive characterization.   She saw Dr Glennon Mac for consult. Pelvic US 08/31/21  Indications: Pelvic masses Findings:  The uterus is absent.  Right Ovary measures not definitively visualized. Left Ovary measures not definitively visualized.  Two masses noted in the pelvic region. Both have mixed echogenicity and both appear to be solid.  There are areas of increased density with possible blood flow in some areas on doppler interrogation.   They measure: Left: 3.58 x 4.39 x 3.23 cm Right: 4.51 x 3.50 x 3.57 cm  There is no free fluid in the cul de sac.  She is asymptomatic with no pelvic pain or bleeding.  Some urinary pressure.  Vaginal hysterectomy in later 20s due to abnormal PAPs   Problem List: Patient Active  Problem List   Diagnosis Date Noted   Atherosclerosis of aortic bifurcation and common iliac arteries ([place]) 10/22/2020   Abdominal aortic aneurysm (AAA) without rupture ([place])  11/11/2018   History of breast cancer 11/11/2018   COPD (chronic obstructive pulmonary disease) with chronic bronchitis ([place]) 06/19/2018   Skin lesion of chest wall 06/15/2016   Vitamin D deficiency 05/29/2016   Tobacco abuse 05/25/2016   Controlled substance agreement signed 05/25/2016   Advance care planning 05/25/2016   History of colonic polyps 06/12/2014   Esophageal reflux 06/12/2014   Hyperlipidemia 05/11/2014   Lupus ([facility]) 04/16/2013   Generalized anxiety disorder 04/16/2013   Personal history of other malignant neoplasm of skin 03/29/2012    Past Medical History: Past Medical History:  Diagnosis Date   Abdominal aortic aneurysm (AAA) without rupture ([place])    Arthritis 2005   Breast cancer, left breast ([place]) 2011   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 This was a histologic grade 2 tumor measuring just under 1 cm in diameter. No invasive cancer was identified. ER 90%, PR 40%.   Breast cancer, right breast ([place]) 1994   Invasive ductal carcinoma.The patient underwent right mastectomy in 1994 for invasive ductal carcinoma.   COPD (chronic obstructive pulmonary disease) ([place])    Discoid lupus 2005   Discoid lupus 2005   Emphysematous bleb ([facility])    GERD (gastroesophageal reflux disease)    Heart murmur 2005   Hypercholesterolemia 2012   Mammographic microcalcification 2011   Panic attack    Personal history of chemotherapy 1994   Personal history of colonic polyps    Colon polyps   Personal history of other malignant neoplasm of skin 2015   basal cell   Personal history of tobacco use, presenting hazards to health    PUD (peptic ulcer disease)    Last EGD 1/09   Pulmonary nodule    Rectal polyp 2008   villous adenoma; last colonoscopy 1/09 WNL   Skin cancer, basal cell 2015   right cheek Mohs surgery at [facility] use    Ulcer     Past Surgical History: Past Surgical History:  Procedure Laterality Date   APPENDECTOMY   2011   BREAST BIOPSY  12/27/2009   right breast   CARDIAC CATHETERIZATION  1996   Negative Cath   COLONOSCOPY N/A 08/05/2014   Procedure: COLONOSCOPY;  Surgeon: [name], MD;  Location: [facility] ENDOSCOPY;  EGD, tubular adenoma 1.   COLONOSCOPY W/ BIOPSIES  2005,2009   Dr. Bary Castilla. Normal exam 2009. Villous adenoma of the rectum in 2005 adenomatous polyp from the ascending colon and rectum in 2002.   ESOPHAGOGASTRODUODENOSCOPY N/A 08/05/2014   Procedure: ESOPHAGOGASTRODUODENOSCOPY (EGD);  Surgeon: [name], MD;  Location: [facility] ENDOSCOPY;  Service: Endoscopy;  Laterality: N/A;   ESOPHAGOGASTRODUODENOSCOPY (EGD) WITH PROPOFOL N/A 07/25/2017   Procedure: ESOPHAGOGASTRODUODENOSCOPY (EGD) WITH PROPOFOL;  Surgeon: [name], MD;  Location: [facility] ENDOSCOPY;  Service: Endoscopy;  Laterality: N/A;   ESOPHAGOGASTRODUODENOSCOPY (EGD) WITH PROPOFOL N/A 08/03/2021   Procedure: ESOPHAGOGASTRODUODENOSCOPY (EGD) WITH PROPOFOL;  Surgeon: [name], MD;  Location: [facility] ENDOSCOPY;  Service: Endoscopy;  Laterality: N/A;   MASTECTOMY     bilateral   SIMPLE MASTECTOMY  1993   right   SIMPLE MASTECTOMY  2011   left   TONSILLECTOMY  1997   UPPER GI ENDOSCOPY  2003, 2005, 2009, 2012, 2014   2012 showed mild reflux changes without evidence of Barrett's epithelial changes  2014 biopsies at 37 cm showed changes suggestive of reflux esophagitis. No metaplasia.   VAGINAL HYSTERECTOMY  1977    Past Gynecologic History:  Post menopausal.     OB History:  OB History  Gravida Para Term Preterm AB Living  2 2 2     2   SAB IAB Ectopic Multiple Live Births               # Outcome Date GA Lbr Len/2nd Weight Sex Delivery Anes PTL Lv  2 Term           1 Term             Obstetric Comments  Age with first menstruation-15  Age with first pregnancy-24  LMP-1977, hysterectomy    Family History: Family History  Problem Relation Age of Onset   Cancer Father        esophagus   Cancer  Maternal Aunt        breast cancer   Cancer Maternal Grandmother        breast cancer    Social History: Social History   Socioeconomic History   Marital status: Widowed    Spouse name: Not on file   Number of children: Not on file   Years of education: Not on file   Highest education level: Not on file  Occupational History   Occupation: Optician, 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 She has two adult daughters. She lives in her home. Her youngest daughter and grandchildren live with her. She has two dogs. 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 She loves watching her grandson play basketball. She also loves gardening and being outdoors and working in the yard.    Social Determinants of Health   Financial Resource Strain: Not on file  Food Insecurity: Not on file  Transportation Needs: Not on file  Physical Activity: Not on file  Stress: Not on file  Social Connections: Not on file  Intimate Partner Violence: Not on file    Allergies: Allergies  Allergen Reactions   Codeine Itching   Incruse Ellipta [Umeclidinium Bromide] Other (See Comments)    Oral sores   Sulfa Antibiotics Hives   Sulfinpyrazone    Erythromycin Rash   Penicillins Rash    Current Medications: Current Outpatient Medications  Medication Sig Dispense Refill   acetaminophen (TYLENOL) 500 MG  tablet Take 500 mg by mouth every 6 (six) hours as needed for headache.     albuterol (PROVENTIL) (2.5 MG/3ML) 0.083% nebulizer solution Take 3 mLs (2.5 mg total) by nebulization every 6 (six) hours as needed for wheezing or shortness of breath. 150 mL 1   albuterol (VENTOLIN HFA) 108 (90 Base) MCG/ACT inhaler Inhale 1-2 puffs into  the lungs every 6 (six) hours as needed for wheezing or shortness of breath. 18 each 6   ALPRAZolam (XANAX) 0.5 MG tablet Take 1 tablet (0.5 mg total) by mouth 2 (two) times daily as needed for anxiety. 45 tablet 1   Ascorbic Acid (VITAMIN C) 100 MG tablet Take 100 mg by mouth daily.     budesonide-formoterol (SYMBICORT) 160-4.5 MCG/ACT inhaler Inhale 2 puffs into the lungs 2 (two) times daily. 3 each 3   Calcium-Vitamins C & D (CALCIUM/C/D PO) Take by mouth daily.     cetirizine (ZYRTEC) 10 MG tablet Take 10 mg by mouth daily.     Cholecalciferol (VITAMIN D3) 50 MCG (2000 UT) TABS Take by mouth.     clobetasol cream (TEMOVATE) 4.31 % Apply 1 application topically daily.     Homeopathic Products (ZICAM ALLERGY RELIEF NA) Place 2 sprays into the nose daily as needed (allergies).      hydroxychloroquine (PLAQUENIL) 200 MG tablet Take by mouth daily. (Patient not taking: Reported on 08/08/2021)     KRILL OIL PO Take by mouth daily.     montelukast (SINGULAIR) 10 MG tablet TAKE 1 TABLET BY MOUTH EVERYDAY AT BEDTIME 90 tablet 1   Multiple Vitamin (MULTIVITAMIN PO) Take by mouth daily.     omeprazole (PRILOSEC) 20 MG capsule Take 1 capsule (20 mg total) by mouth daily. 90 capsule 1   vitamin E 100 UNIT capsule Take by mouth daily.     zinc gluconate 50 MG tablet Take 50 mg by mouth every other day.     No current facility-administered medications for this visit.   General: negative for fevers, changes in weight or night sweats Skin: negative for changes in moles or sores or rash Eyes: negative for changes in vision HEENT: negative for change in hearing, tinnitus, voice  changes. Positive for seasonal allergies Pulmonary: positive for cough Cardiac: negative for palpitations, pain Gastrointestinal: positive for abdominal pain Genitourinary/Sexual: positive for stress incontinence Ob/Gyn:  positive for pelvic pain Musculoskeletal: negative for pain, joint pain, back pain Hematology: negative for easy bruising, abnormal bleeding Neurologic/Psych: negative for headaches, seizures, paralysis, weakness, numbness. Positive for depression & anxiety  Objective:  Physical Examination:  BP 136/82   Pulse 84   Resp 18   Wt 152 lb (68.9 kg)   BMI 26.93 kg/m    ECOG Performance Status: 1 - Symptomatic but completely ambulatory  General appearance: alert, cooperative, and appears stated age HEENT:extra ocular movement intact, thyroid without masses, and trachea midline Lymph node survey: non-palpable, axillary, inguinal, supraclavicular Cardiovascular: regular rate and rhythm, no murmurs or gallops Respiratory: normal air entry, lungs clear to auscultation and no rales, rhonchi or wheezing Abdomen: soft, non-tender, without masses or organomegaly, no hernias, and well healed incision Back: inspection of back is normal Extremities: extremities normal, atraumatic, no cyanosis or edema Skin exam - normal coloration and turgor, no rashes, no suspicious skin lesions noted. Neurological exam reveals alert, oriented, normal speech, no focal findings or movement disorder noted.  Pelvic: exam chaperoned by nurse;  Vulva: normal appearing vulva with no masses, tenderness or lesions; Vagina: normal vagina; Adnexa; hard mass palpable above vagina. Rectal: confirms.    Lab Review Lab Results  Component Value Date   WBC 5.5 01/27/2021   HGB 14.2 01/27/2021   HCT 42.3 01/27/2021   MCV 96 01/27/2021   PLT 174 01/27/2021      Chemistry      Component Value Date/Time   NA 141 01/27/2021 0000  K 4.7 01/27/2021 0000   CL 108 (H) 01/27/2021 0000   CO2 22 01/27/2021  0000   BUN 16 01/27/2021 0000   CREATININE 1.09 (H) 01/27/2021 0000   GLU 92 01/02/2014 0000      Component Value Date/Time   CALCIUM 10.1 01/27/2021 0000   ALKPHOS 89 01/27/2021 0000   AST 22 01/27/2021 0000   ALT 26 01/27/2021 0000   BILITOT 0.3 01/27/2021 0000         Assessment:  Maureen [name] is a 76 y.o. female diagnosed with bilateral 6-7 cm solid ovarian masses on PET/CT done for evaluation of lung nodule.  Indeterminate on PET based on low SUV. Suspect these are benign and differential includes fibroadenoma or sex cord stromal tumors.  Smoker with indeterminate lung nodules.  Considering SBRT for possible lung cancer, but plan for now is to continue close follow up.  She has COPD, but seems fairly functional and is able to climb stairs.   Medical co-morbidities complicating care: COPD, breast cancer, lupus.  Plan:   Problem List Items Addressed This Visit       Other   Bilateral tubo-ovarian mass    We discussed options for management including surgery and she is scheduled for laparoscopic BSO, possible pelvic/aortic LN biopsies, possible laparotomy 10/05/21. Will check pre op labs as well as CEA, CA125 and inhibin.   Will check with pulmonary, but think she is a good surgical candidate.  Walks up stairs with no problems.  The risks of surgery were discussed in detail and she understands these to include infection; wound separation; hernia; vaginal cuff separation, injury to adjacent organs such as bowel, bladder, blood vessels, ureters and nerves; bleeding which may require blood transfusion; anesthesia risk; thromboembolic events; possible death; unforeseen complications; possible need for re-exploration; medical complications such as heart attack, stroke, pleural effusion and pneumonia; and, if staging performed the risk of lymphedema and lymphocyst.  The patient will receive DVT and antibiotic prophylaxis as indicated.  She voiced a clear understanding.  She had the  opportunity to ask questions and written informed consent was obtained today.   Suggested return to clinic post op.     The patient's diagnosis, an outline of the further diagnostic and laboratory studies which will be required, the recommendation, and alternatives were discussed.  All questions were answered to the patient's satisfaction.  A total of 60 minutes were spent with the patient/family today; 50% was spent in education, counseling and coordination of care for solid ovarian masses.    Mellody Drown, MD   CC:  Will Bonnet, MD [address] [place],  [place] [address] [phone]

## 2021-08-31 NOTE — Patient Instructions (Signed)
Surgery will be scheduled for 10/05/21. You will have a telephone pre-admission visit. You will be notified with the date/time once scheduled. We will see you back for a post operative check 4-6 weeks after surgery.                DIVISION OF GYNECOLOGIC ONCOLOGY BOWEL PREP   The following instructions are extremely important to prepare for your surgery. Please follow them carefully   Step 1: Liquid Diet Instructions              Clear Liquid Diet for GYN Oncology Patients Day Before Surgery The day before your scheduled surgery DO NOT EAT any solid foods.  We do want you to drink enough liquids, but NO MILK products.  We do not want you to be dehydrated.  Clear liquids are defined as no milk products and no pieces of any solid food. Drink at least 64 oz. of fluid.  The following are all approved for you to drink the day before you surgery. Chicken, Beef or Vegetable Broth (bouillon or consomm) - NO BROTH AFTER MIDNIGHT Plain Jello  (no fruit) Water Strained lemonade or fruit punch Gatorade (any flavor) CLEAR Ensure or Boost Breeze Fruit juices without pulp, such as apple, grape, or cranberry juice Clear sodas - NO SODA AFTER MIDNIGHT Ice Pops without bits of fruit or fruit pulp Honey Tea or coffee without milk or cream                 Any foods not on the above list should be avoided                                                                                             Step 2: Laxatives           The evening before surgery:   Time: around 5pm   Follow these instructions carefully.   Administer 1 Dulcolax suppository according to manufacturer instructions on the box. You will need to purchase this laxative at a pharmacy or grocery store.    Individual responses to laxatives vary; this prep may cause multiple bowel movements. It often works in 30 minutes and may take as long as 3 hours. Stay near an available bathroom.    It is important to stay hydrated. Ensure you are  still drinking clear liquids.     IMPORTANT: FOR YOUR SAFETY, WE WILL HAVE TO CANCEL YOUR SURGERY IF YOU DO NOT FOLLOW THESE INSTRUCTIONS.   Do not eat anything after midnight (including gum or candy) prior to your surgery. Avoid drinking carbonated beverages after midnight. You can have clear liquids up until one hour before you arrive at the hospital. "Nothing by mouth" means no liquids, gum, candy, etc for one hour before your arrival time.      Laparoscopy Laparoscopy is a procedure to diagnose diseases in the abdomen. During the procedure, a thin, lighted, pencil-sized instrument called a laparoscope is inserted into the abdomen through an incision. The laparoscope allows your health care provider to look at the organs inside your body. LET [facility] CARE PROVIDER KNOW ABOUT: Any allergies  you have. All medicines you are taking, including vitamins, herbs, eye drops, creams, and over-the-counter medicines. Previous problems you or members of your family have had with the use of anesthetics. Any blood disorders you have. Previous surgeries you have had. Medical conditions you have. RISKS AND COMPLICATIONS  Generally, this is a safe procedure. However, problems can occur, which may include: Infection. Bleeding. Damage to other organs. Allergic reaction to the anesthetics used during the procedure. BEFORE THE PROCEDURE Do not eat or drink anything after midnight on the night before the procedure or as directed by your health care provider. Ask your health care provider about: Changing or stopping your regular medicines. Taking medicines such as aspirin and ibuprofen. These medicines can thin your blood. Do not take these medicines before your procedure if your health care provider instructs you not to. Plan to have someone take you home after the procedure. PROCEDURE You may be given a medicine to help you relax (sedative). You will be given a medicine to make you sleep (general  anesthetic). Your abdomen will be inflated with a gas. This will make your organs easier to see. Small incisions will be made in your abdomen. A laparoscope and other small instruments will be inserted into the abdomen through the incisions. A tissue sample may be removed from an organ in the abdomen for examination. The instruments will be removed from the abdomen. The gas will be released. The incisions will be closed with stitches (sutures). AFTER THE PROCEDURE  Your blood pressure, heart rate, breathing rate, and blood oxygen level will be monitored often until the medicines you were given have worn off.   This information is not intended to replace advice given to you by your health care provider. Make sure you discuss any questions you have with your health care provider.                                             Bowel Symptoms After Surgery After gynecologic surgery, women often have temporary changes in bowel function (constipation and gas pain).  Following are tips to help prevent and treat common bowel problems.  It also tells you when to call the doctor.  This is important because some symptoms might be a sign of a more serious bowel problem such as obstruction (bowel blockage).  These problems are rare but can happen after gynecologic surgery.   Besides surgery, what can temporarily affect bowel function? 1. Dietary changes   2. Decreased physical activity   3.Antibiotics   4. Pain medication   How can I prevent constipation (three days or more without a stool)? Include fiber in your diet: whole grains, raw or dried fruits & vegetables, prunes, prune/pear juiceDrink at least 8 glasses of liquid (preferably water) every day Avoid: Gas forming foods such as broccoli, beans, peas, salads, cabbage, sweet potatoes Greasy, fatty, or fried foods Activity helps bowel function return to normal, walk around the house at least 3-4 times each day for 15 minutes or longer, if tolerated.   Rocking in a rocking chair is preferable to sitting still. Stool softeners: these are not laxatives, but serve to soften the stool to avoid straining.  Take 2-4 times a day until normal bowel function returns         Examples: Colace or generic equivalent (Docusate) Bulk laxatives: provide a concentrated source of fiber.  They do not stimulate the bowel.  Take 1-2 times each day until normal bowel function return.              Examples: Citrucel, Metamucil, Fiberal, Fibercon   What can I take for "Gas Pains"? Simethicone (Mylicon, Gas-X, Maalox-Gas, Mylanta-Gas) take 3-4 times a day Maalox Regular - take 3-4 times a day Mylanta Regular - take 3-4 times a day   What can I take if I become constipated? Start with stool softeners and add additional laxatives below as needed to have a bowel movement every 1-2 days  Stool softeners 1-2 tablets, 2 times a day Senokot 1-2 tablets, 1-2 times a day Glycerin suppository can soften hard stool take once a day Bisacodyl suppository once a day  Milk of Magnesia 30 mL 1-2 times a day Fleets or tap water enema    What can I do for nausea?  Limit most solid foods for 24-48 hours Continue eating small frequent amounts of liquids and/or bland soft foods Toast, crackers, cooked cereal (grits, cream of wheat, rice) Benadryl: a mild anti-nausea medicine can be obtained without a prescription. May cause drowsiness, especially if taken with narcotic pain medicines Contact provider for prescription nausea medication     What can I do, or take for diarrhea (more than five loose stools per day)? Drink plenty of clear fluids to prevent dehydration May take Kaopectate, Pepto-Bismol, Imodium, or probiotics for 1-2 days Anusol or Preparation-H can be helpful for hemorrhoids and irritated tissue around anus   When should I call the doctor?             CONSTIPATION:  Not relieved after three days following the above program VOMITING: That contains blood, "coffee  ground" material More the three times/hour and unable to keep down nausea medication for more than eight hours With dry mouth, dark or strong urine, feeling light-headed, dizzy, or confused With severe abdominal pain or bloating for more than 24 hours DIARRHEA: That continues for more then 24-48 hours despite treatment That contains blood or tarry material With dry mouth, dark or strong urine, feeling light~headed, dizzy, or confused FEVER: 101 F or higher along with nausea, vomiting, gas pain, diarrhea UNABLE TO: Pass gas from rectum for more than 24 hours Tolerate liquids by mouth for more than 24 hours        Laparoscopic Hysterectomy, Care After Refer to this sheet in the next few weeks. These instructions provide you with information on caring for yourself after your procedure. Your health care provider may also give you more specific instructions. Your treatment has been planned according to current medical practices, but problems sometimes occur. Call your health care provider if you have any problems or questions after your procedure. What can I expect after the procedure? Pain and bruising at the incision sites. You will be given pain medicine to control it. Menopausal symptoms such as hot flashes, night sweats, and insomnia if your ovaries were removed. Sore throat from the breathing tube that was inserted during surgery. Follow these instructions at home: Only take over-the-counter or prescription medicines for pain, discomfort, or fever as directed by your health care provider. Do not take aspirin. It can cause bleeding. Do not drive when taking pain medicine. Follow your health care provider's advice regarding diet, exercise, lifting, driving, and general activities. Resume your usual diet as directed and allowed. Get plenty of rest and sleep. Do not douche, use tampons, or have sexual intercourse for at least 6 weeks, or  until your health care provider gives you  permission. Change your bandages (dressings) as directed by your health care provider. Monitor your temperature and notify your health care provider of a fever. Take showers instead of baths for 2-3 weeks. Do not drink alcohol until your health care provider gives you permission. If you develop constipation, you may take a mild laxative with your health care provider's permission. Bran foods may help with constipation problems. Drinking enough fluids to keep your urine clear or pale yellow may help as well. Try to have someone home with you for 1-2 weeks to help around the house. Keep all of your follow-up appointments as directed by your health care provider. Contact a health care provider if: You have swelling, redness, or increasing pain around your incision sites. You have pus coming from your incision. You notice a bad smell coming from your incision. Your incision breaks open. You feel dizzy or lightheaded. You have pain or bleeding when you urinate. You have persistent diarrhea. You have persistent nausea and vomiting. You have abnormal vaginal discharge. You have a rash. You have any type of abnormal reaction or develop an allergy to your medicine. You have poor pain control with your prescribed medicine. Get help right away if: You have chest pain or shortness of breath. You have severe abdominal pain that is not relieved with pain medicine. You have pain or swelling in your legs. This information is not intended to replace advice given to you by your health care provider. Make sure you discuss any questions you have with your health care provider. Document Released: 12/25/2012 Document Revised: 08/12/2015 Document Reviewed: 09/24/2012 Elsevier Interactive Patient Education  2017 [employer].

## 2021-09-01 LAB — CEA: CEA: 4.2 ng/mL (ref 0.0–4.7)

## 2021-09-01 LAB — CA 125: Cancer Antigen (CA) 125: 30.6 U/mL (ref 0.0–38.1)

## 2021-09-02 LAB — INHIBIN B: Inhibin B: <7 pg/mL (ref 0.0–16.9)

## 2021-09-05 ENCOUNTER — Ambulatory Visit: Payer: Medicare HMO | Admitting: Radiation Oncology

## 2021-09-09 ENCOUNTER — Other Ambulatory Visit: Payer: Medicare HMO

## 2021-09-09 DIAGNOSIS — E782 Mixed hyperlipidemia: Secondary | ICD-10-CM

## 2021-09-09 DIAGNOSIS — E559 Vitamin D deficiency, unspecified: Secondary | ICD-10-CM

## 2021-09-10 LAB — LIPID PANEL W/O CHOL/HDL RATIO
Cholesterol, Total: 218 mg/dL — ABNORMAL HIGH (ref 100–199)
HDL: 44 mg/dL (ref >39)
LDL Chol Calc (NIH): 153 mg/dL — ABNORMAL HIGH (ref 0–99)
Triglycerides: 119 mg/dL (ref 0–149)
VLDL Cholesterol Cal: 21 mg/dL (ref 5–40)

## 2021-09-10 LAB — COMPREHENSIVE METABOLIC PANEL
ALT: 21 IU/L (ref 0–32)
AST: 17 IU/L (ref 0–40)
Albumin/Globulin Ratio: 2.4 — ABNORMAL HIGH (ref 1.2–2.2)
Albumin: 4.4 g/dL (ref 3.7–4.7)
Alkaline Phosphatase: 99 IU/L (ref 44–121)
BUN/Creatinine Ratio: 11 — ABNORMAL LOW (ref 12–28)
BUN: 13 mg/dL (ref 8–27)
Bilirubin Total: 0.2 mg/dL (ref 0.0–1.2)
CO2: 20 mmol/L (ref 20–29)
Calcium: 9.8 mg/dL (ref 8.7–10.3)
Chloride: 107 mmol/L — ABNORMAL HIGH (ref 96–106)
Creatinine, Ser: 1.15 mg/dL — ABNORMAL HIGH (ref 0.57–1.00)
Globulin, Total: 1.8 g/dL (ref 1.5–4.5)
Glucose: 91 mg/dL (ref 70–99)
Potassium: 4.5 mmol/L (ref 3.5–5.2)
Sodium: 142 mmol/L (ref 134–144)
Total Protein: 6.2 g/dL (ref 6.0–8.5)
eGFR: 50 mL/min/{1.73_m2} — ABNORMAL LOW (ref >59)

## 2021-09-10 LAB — VITAMIN D 25 HYDROXY (VIT D DEFICIENCY, FRACTURES): Vit D, 25-Hydroxy: 19.5 ng/mL — ABNORMAL LOW (ref 30.0–100.0)

## 2021-09-12 ENCOUNTER — Other Ambulatory Visit: Payer: Self-pay | Admitting: Family Medicine

## 2021-09-12 MED ORDER — VITAMIN D (ERGOCALCIFEROL) 1.25 MG (50000 UNIT) PO CAPS: 50000.0000 [IU] | ORAL_CAPSULE | ORAL | 1 refills | Status: DC

## 2021-09-26 ENCOUNTER — Encounter: Payer: Self-pay | Admitting: Emergency Medicine

## 2021-09-26 ENCOUNTER — Other Ambulatory Visit: Payer: Self-pay

## 2021-09-26 ENCOUNTER — Ambulatory Visit
Admission: EM | Admit: 2021-09-26 | Discharge: 2021-09-26 | Disposition: A | Discharge status: Home / Self Care | Payer: Medicare HMO | Attending: Emergency Medicine | Admitting: Emergency Medicine

## 2021-09-26 ENCOUNTER — Encounter
Admission: RE | Admit: 2021-09-26 | Discharge: 2021-09-26 | Disposition: A | Discharge status: Home / Self Care | Payer: Medicare HMO | Source: Ambulatory Visit | Attending: Obstetrics and Gynecology | Admitting: Obstetrics and Gynecology

## 2021-09-26 DIAGNOSIS — Z01818 Encounter for other preprocedural examination: Secondary | ICD-10-CM

## 2021-09-26 DIAGNOSIS — L03011 Cellulitis of right finger: Secondary | ICD-10-CM | POA: Diagnosis not present

## 2021-09-26 HISTORY — DX: Abnormal electrocardiogram (ECG) (EKG): R94.31

## 2021-09-26 HISTORY — DX: Bronchitis, not specified as acute or chronic: J40

## 2021-09-26 MED ORDER — DOXYCYCLINE HYCLATE 100 MG PO CAPS: 100.0000 mg | ORAL_CAPSULE | Freq: Two times a day (BID) | ORAL | 0 refills | Status: DC

## 2021-09-26 NOTE — Patient Instructions (Addendum)
Your procedure is scheduled on:10-05-21 Wednesday Report to the Registration Desk on the 1st floor of the [facility].Then proceed to the 2nd floor Surgery Desk  To find out your arrival time, please call [phone] between 1PM - 3PM on:10-04-21 Tuesday If your arrival time is 6:00 am, do not arrive prior to that time as the [facility] entrance doors do not open until 6:00 am.  REMEMBER: Instructions that are not followed completely may result in serious medical risk, up to and including death; or upon the discretion of your surgeon and anesthesiologist your surgery may need to be rescheduled.  Do not eat food OR drink any liquids after midnight the night before surgery.  No gum chewing, lozengers or hard candies.  TAKE THESE MEDICATIONS THE MORNING OF SURGERY WITH A SIP OF WATER: -cetirizine (ZYRTEC ALLERGY)  -omeprazole (PRILOSEC) -take one the night before and one on the morning of surgery - helps to prevent nausea after surgery.) -You may take your ALPRAZolam Duanne Moron) the day of surgery if needed for anxiety  Use your SYMBICORT inhaler and your Albuterol Inhaler the day of surgery and bring your Albuterol Inhaler to the hospital  One week prior to surgery: Stop Anti-inflammatories (NSAIDS) such as Advil, Aleve, Ibuprofen, Motrin, Naproxen, Naprosyn and Aspirin based products such as Excedrin, Goodys Powder, BC Powder.You may however, take Tylenol if needed for pain up until the day of surgery.  Stop ANY OVER THE COUNTER supplements/vitamins NOW (09-27-21) until after surgery (Vitamin C, Calcium-C and D, Vitamin D3, Krill oil, Vitamin E and zinc)  No Alcohol for 24 hours before or after surgery.  No Smoking including e-cigarettes for 24 hours prior to surgery.  No chewable tobacco products for at least 6 hours prior to surgery.  No nicotine patches on the day of surgery.  Do not use any "recreational" drugs for at least a week prior to your surgery.  Please be advised that the  combination of cocaine and anesthesia may have negative outcomes, up to and including death. If you test positive for cocaine, your surgery will be cancelled.  On the morning of surgery brush your teeth with toothpaste and water, you may rinse your mouth with mouthwash if you wish. Do not swallow any toothpaste or mouthwash.  Use CHG Soap as directed on instruction sheet.  Do not wear jewelry, make-up, hairpins, clips or nail polish.  Do not wear lotions, powders, or perfumes.   Do not shave body from the neck down 48 hours prior to surgery just in case you cut yourself which could leave a site for infection.  Also, freshly shaved skin may become irritated if using the CHG soap.  Contact lenses, hearing aids and dentures may not be worn into surgery.  Do not bring valuables to the hospital. [facility] is not responsible for any missing/lost belongings or valuables.   Fleets enema as directed-Do Fleet enema at home the morning of surgery 1 hour prior to your arrival time to the hospital  Notify your doctor if there is any change in your medical condition (cold, fever, infection).  Wear comfortable clothing (specific to your surgery type) to the hospital.  After surgery, you can help prevent lung complications by doing breathing exercises.  Take deep breaths and cough every 1-2 hours. Your doctor may order a device called an Incentive Spirometer to help you take deep breaths. When coughing or sneezing, hold a pillow firmly against your incision with both hands. This is called "splinting." Doing this helps  protect your incision. It also decreases belly discomfort.  If you are being admitted to the hospital overnight, leave your suitcase in the car. After surgery it may be brought to your room.  If you are being discharged the day of surgery, you will not be allowed to drive home. You will need a responsible adult (18 years or older) to drive you home and stay with you that night.    If you are taking public transportation, you will need to have a responsible adult (18 years or older) with you. Please confirm with your physician that it is acceptable to use public transportation.   Please call the [facility] at [phone] if you have any questions about these instructions.  Surgery Visitation Policy:  Patients undergoing a surgery or procedure may have two family members or support persons with them as long as the person is not COVID-19 positive or experiencing its symptoms.

## 2021-09-26 NOTE — ED Triage Notes (Signed)
Patient reports getting a sand spur in right middle finger.  Right middle tip with swelling and pus.

## 2021-09-26 NOTE — ED Provider Notes (Signed)
[facility]    CSN: [id number] Arrival date & time: 09/26/21  1455      History   Chief Complaint Chief Complaint  Patient presents with   Finger Injury    HPI Maureen [name] is a 76 y.o. female.   HPI  76 year old female here for evaluation of pain in right middle finger.  Patient reports that she has been experiencing pain and swelling around the cuticle of her right middle finger for the past week.  She states that she was helping her daughter get a sand spur out of her dogs for and she felt one of the barbs going into her finger.  She states she has not been able to get it down.  She denies any drainage or fever but states that the finger is swollen and hot.  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 This was a histologic grade 2 tumor measuring just under 1 cm in diameter. No invasive cancer was identified. ER 90%, PR 40%.   Breast cancer, right breast ([place]) 1994   Invasive ductal carcinoma.The patient underwent right mastectomy in 1994 for invasive ductal carcinoma.   Bronchitis    COPD (chronic obstructive pulmonary disease) ([place])    Discoid lupus 2005   Discoid lupus 2005   Emphysematous bleb (HCC)    GERD (gastroesophageal reflux disease)    Heart murmur 2005   Hypercholesterolemia 2012   Mammographic microcalcification 2011   Panic attack    Personal history of chemotherapy 1994   Personal history of colonic polyps    Colon polyps   Personal history of other malignant neoplasm of skin 2015   basal cell   Personal history of tobacco use, presenting hazards to health    PUD (peptic ulcer disease)    Last EGD 1/09   Pulmonary nodule    Rectal polyp 2008   villous  adenoma; last colonoscopy 1/09 WNL   Skin cancer, basal cell 2015   right cheek Mohs surgery at [place] use    Ulcer     Patient Active Problem List   Diagnosis Date Noted   Bilateral tubo-ovarian mass 08/31/2021   Adnexal mass 08/29/2021   Atherosclerosis of aortic bifurcation and common iliac arteries ([place]) 10/22/2020   Abdominal aortic aneurysm (AAA) without rupture ([place]) 11/11/2018   History of breast cancer 11/11/2018   COPD (chronic obstructive pulmonary disease) with chronic bronchitis ([place]) 06/19/2018   Skin lesion of chest wall 06/15/2016   Vitamin D deficiency 05/29/2016   Tobacco abuse 05/25/2016   Controlled substance agreement signed 05/25/2016   Advance care planning 05/25/2016   History of colonic polyps 06/12/2014   Esophageal reflux 06/12/2014   Hyperlipidemia 05/11/2014   Lupus ([place]) 04/16/2013   Generalized anxiety disorder 04/16/2013   Personal history of other malignant neoplasm of skin 03/29/2012    Past Surgical History:  Procedure Laterality Date   APPENDECTOMY  2011   BREAST BIOPSY  12/27/2009   right breast   CARDIAC CATHETERIZATION  1996   Negative Cath   COLONOSCOPY N/A 08/05/2014   Procedure: COLONOSCOPY;  Surgeon: [name], MD;  Location: [facility] ENDOSCOPY;  EGD,  tubular adenoma 1.   COLONOSCOPY W/ BIOPSIES  8563,1497   Dr. Bary Castilla. Normal exam 2009. Villous adenoma of the rectum in 2005 adenomatous polyp from the ascending colon and rectum in 2002.   ESOPHAGOGASTRODUODENOSCOPY N/A 08/05/2014   Procedure: ESOPHAGOGASTRODUODENOSCOPY (EGD);  Surgeon: [name], MD;  Location: [facility] ENDOSCOPY;  Service: Endoscopy;  Laterality: N/A;   ESOPHAGOGASTRODUODENOSCOPY (EGD) WITH PROPOFOL N/A 07/25/2017   Procedure: ESOPHAGOGASTRODUODENOSCOPY (EGD) WITH PROPOFOL;  Surgeon: [name], MD;  Location: [facility] ENDOSCOPY;  Service: Endoscopy;  Laterality: N/A;   ESOPHAGOGASTRODUODENOSCOPY (EGD) WITH PROPOFOL N/A 08/03/2021    Procedure: ESOPHAGOGASTRODUODENOSCOPY (EGD) WITH PROPOFOL;  Surgeon: [name], MD;  Location: [facility] ENDOSCOPY;  Service: Endoscopy;  Laterality: N/A;   MASTECTOMY     bilateral   SIMPLE MASTECTOMY  1993   right   SIMPLE MASTECTOMY  2011   left   TONSILLECTOMY  1997   UPPER GI ENDOSCOPY  2003, 2005, 2009, 2012, 2014   2012 showed mild reflux changes without evidence of Barrett's epithelial changes 2014 biopsies at 37 cm showed changes suggestive of reflux esophagitis. No metaplasia.   VAGINAL HYSTERECTOMY  1977    OB History     Gravida  2   Para  2   Term  2   Preterm      AB      Living  2      SAB      IAB      Ectopic      Multiple      Live Births           Obstetric Comments  Age with first menstruation-15 Age with first pregnancy-24 LMP-1977, hysterectomy          Home Medications    Prior to Admission medications   Medication Sig Start Date End Date Taking? Authorizing Provider  doxycycline (VIBRAMYCIN) 100 MG capsule Take 1 capsule (100 mg total) by mouth 2 (two) times daily. 09/26/21  Yes Margarette Canada, NP  acetaminophen (TYLENOL) 500 MG tablet Take 500 mg by mouth every 6 (six) hours as needed for headache.    [provider]  albuterol (PROVENTIL) (2.5 MG/3ML) 0.083% nebulizer solution Take 3 mLs (2.5 mg total) by nebulization every 6 (six) hours as needed for wheezing or shortness of breath. 08/05/20   Jon Billings, NP  albuterol (VENTOLIN HFA) 108 (90 Base) MCG/ACT inhaler Inhale 1-2 puffs into the lungs every 6 (six) hours as needed for wheezing or shortness of breath. 08/08/21   Wynetta Emery, Megan P, DO  ALPRAZolam Duanne Moron) 0.5 MG tablet Take 1 tablet (0.5 mg total) by mouth 2 (two) times daily as needed for anxiety. 08/08/21   Johnson, Megan P, DO  Ascorbic Acid (VITAMIN C) 100 MG tablet Take 100 mg by mouth daily.    [provider]  budesonide-formoterol (SYMBICORT) 160-4.5 MCG/ACT inhaler Inhale 2 puffs into the  lungs 2 (two) times daily. 08/08/21   Johnson, Megan P, DO  Calcium-Vitamins C & D (CALCIUM/C/D PO) Take 1 tablet by mouth daily.    [provider]  cetirizine (ZYRTEC ALLERGY) 10 MG tablet Take 10 mg by mouth every morning.    [provider]  Cholecalciferol (VITAMIN D3) 50 MCG (2000 UT) TABS Take 1 tablet by mouth daily at 6 (six) AM.    [provider]  fluticasone (FLONASE) 50 MCG/ACT nasal spray Place 2 sprays into the nose every morning.    [provider]  hydroxychloroquine (PLAQUENIL) 200 MG tablet  Take by mouth daily. Patient not taking: Reported on 08/08/2021    [provider]  KRILL OIL PO Take by mouth daily.    [provider]  montelukast (SINGULAIR) 10 MG tablet TAKE 1 TABLET BY MOUTH EVERYDAY AT BEDTIME 08/08/21   Johnson, Megan P, DO  omeprazole (PRILOSEC) 20 MG capsule Take 1 capsule (20 mg total) by mouth daily. Patient taking differently: Take 20 mg by mouth every morning. 08/08/21   Park Liter P, DO  vitamin E 100 UNIT capsule Take 100 Units by mouth daily.    [provider]  zinc gluconate 50 MG tablet Take 50 mg by mouth as needed.    [provider]    Family History Family History  Problem Relation Age of Onset   Cancer Father        esophagus   Cancer Maternal Aunt        breast cancer   Cancer Maternal Grandmother        breast cancer    Social History Social History   Tobacco Use   Smoking status: Every Day    Packs/day: 0.50    Years: 40.00    Total pack years: 20.00    Types: Cigarettes   Smokeless tobacco: Never   Tobacco comments:    Pt wants to do hypnotizing  Vaping Use   Vaping Use: Never used  Substance Use Topics   Alcohol use: Yes    Alcohol/week: 1.0 standard drink of alcohol    Types: 1 Cans of beer per week    Comment: 1 beer weekly   Drug use: No     Allergies   Codeine, Incruse ellipta [umeclidinium bromide], Sulfa antibiotics, Sulfinpyrazone,  Erythromycin, and Penicillins   Review of Systems Review of Systems  Constitutional:  Negative for fever.  Musculoskeletal:  Positive for myalgias. Negative for arthralgias and joint swelling.  Skin:  Positive for color change. Negative for wound.  Hematological: Negative.   Psychiatric/Behavioral: Negative.       Physical Exam Triage Vital Signs ED Triage Vitals  Enc Vitals Group     BP 09/26/21 1530 (!) 150/76     Pulse Rate 09/26/21 1530 83     Resp 09/26/21 1530 20     Temp 09/26/21 1530 98 F (36.7 C)     Temp Source 09/26/21 1530 Oral     SpO2 09/26/21 1530 97 %     Weight --      Height --      Head Circumference --      Peak Flow --      Pain Score 09/26/21 1526 7     Pain Loc --      Pain Edu? --      Excl. in [place]? --    No data found.  Updated Vital Signs BP 135/75 (BP Location: Right Arm)   Pulse 83   Temp 98 F (36.7 C) (Oral)   Resp 20   SpO2 97%   Visual Acuity Right Eye Distance:   Left Eye Distance:   Bilateral Distance:    Right Eye Near:   Left Eye Near:    Bilateral Near:     Physical Exam Vitals and nursing note reviewed.  Constitutional:      Appearance: Normal appearance. She is not ill-appearing.  HENT:     Head: Normocephalic and atraumatic.  Skin:    General: Skin is warm and dry.     Capillary Refill: Capillary refill takes less  than 2 seconds.     Findings: Erythema present.  Neurological:     General: No focal deficit present.     Mental Status: She is alert and oriented to person, place, and time.  Psychiatric:        Mood and Affect: Mood normal.        Behavior: Behavior normal.        Thought Content: Thought content normal.        Judgment: Judgment normal.      UC Treatments / Results  Labs (all labs ordered are listed, but only abnormal results are displayed) Labs Reviewed - No data to display  EKG   Radiology No results found.  Procedures Incision and Drainage  Date/Time: 09/26/2021 4:03  PM  Performed by: Margarette Canada, NP Authorized by: Margarette Canada, NP   Consent:    Consent obtained:  Verbal   Consent given by:  Patient   Risks discussed:  Bleeding, incomplete drainage, infection and pain   Alternatives discussed:  Referral Universal protocol:    Patient identity confirmed:  Verbally with patient Location:    Type:  Abscess   Location:  Upper extremity   Upper extremity location:  Finger   Finger location:  R long finger Pre-procedure details:    Skin preparation:  Chlorhexidine Sedation:    Sedation type:  None Anesthesia:    Anesthesia method:  Topical application   Topical anesthesia: Painezz spray. Procedure type:    Complexity:  Simple Procedure details:    Incision types:  Stab incision   Incision depth:  Dermal   Drainage:  Purulent   Drainage amount:  Moderate   Wound treatment:  Wound left open   Packing materials:  None Post-procedure details:    Procedure completion:  Tolerated well, no immediate complications  (including critical care time)  Medications Ordered in UC Medications - No data to display  Initial Impression / Assessment and Plan / UC Course  I have reviewed the triage vital signs and the nursing notes.  Pertinent labs & imaging results that were available during my care of the patient were reviewed by me and considered in my medical decision making (see chart for details).  Is a very pleasant, nontoxic-appearing 76 year old female here for evaluation of pain, swelling, and redness to the lateral edge of the cuticle of her right third finger.  Is been like this for a week per her report.  She denies any systemic fevers or drainage.  On exam there is some erythema, fluctuance, tenderness, and warmth to the lateral edge of the cuticle and there is obvious pus collection present.  She has no tenderness or swelling of the DIP joint and she is full range of motion sensation of the finger.  I cleansed the finger with chlorhexidine and  saline, anesthetized it with a 10-second bursts of topical free spray, and used a #11 blade to perform a stab incision.  A moderate volume of thick pus was expressed from the wound.  I will discharge patient home on doxycycline as she has an allergy to penicillin twice daily for 10 days and I have encouraged her to soak her finger in warm water and Epsom salts 2-3 times a day to help promote further drainage.  Return precautions reviewed.   Final Clinical Impressions(s) / UC Diagnoses   Final diagnoses:  Paronychia of finger of right hand     Discharge Instructions      Take the Doxycycline twice daily with food for  10 days.  Soak your finger in warm water and Epsom salts 2-3 times a day to help facilitate drainage.  Keep a dressing on your finger until the drainage has stopped.  You can use over-the-counter Tylenol and ibuprofen according to the package instructions as needed for pain.  Return for reevaluation if you develop any increased redness, swelling, drainage, or red streaks going up your finger, or fever.      ED Prescriptions     Medication Sig Dispense Auth. Provider   doxycycline (VIBRAMYCIN) 100 MG capsule Take 1 capsule (100 mg total) by mouth 2 (two) times daily. 20 capsule Margarette Canada, NP      PDMP not reviewed this encounter.   Margarette Canada, NP 09/26/21 [phone]

## 2021-09-26 NOTE — Discharge Instructions (Signed)
Take the Doxycycline twice daily with food for 10 days.  Soak your finger in warm water and Epsom salts 2-3 times a day to help facilitate drainage.  Keep a dressing on your finger until the drainage has stopped.  You can use over-the-counter Tylenol and ibuprofen according to the package instructions as needed for pain.  Return for reevaluation if you develop any increased redness, swelling, drainage, or red streaks going up your finger, or fever.

## 2021-09-28 ENCOUNTER — Encounter
Admission: RE | Admit: 2021-09-28 | Discharge: 2021-09-28 | Disposition: A | Discharge status: Home / Self Care | Payer: Medicare HMO | Source: Ambulatory Visit | Attending: Obstetrics and Gynecology | Admitting: Obstetrics and Gynecology

## 2021-09-28 DIAGNOSIS — Z01818 Encounter for other preprocedural examination: Secondary | ICD-10-CM | POA: Diagnosis not present

## 2021-09-28 DIAGNOSIS — Z01810 Encounter for preprocedural cardiovascular examination: Secondary | ICD-10-CM

## 2021-09-28 DIAGNOSIS — N838 Other noninflammatory disorders of ovary, fallopian tube and broad ligament: Secondary | ICD-10-CM | POA: Insufficient documentation

## 2021-09-28 LAB — CBC WITH DIFFERENTIAL/PLATELET
Abs Immature Granulocytes: 0.01 10*3/uL (ref 0.00–0.07)
Basophils Absolute: 0.1 10*3/uL (ref 0.0–0.1)
Basophils Relative: 1 %
Eosinophils Absolute: 0.1 10*3/uL (ref 0.0–0.5)
Eosinophils Relative: 2 %
HCT: 40 % (ref 36.0–46.0)
Hemoglobin: 13.2 g/dL (ref 12.0–15.0)
Immature Granulocytes: 0 %
Lymphocytes Relative: 32 %
Lymphs Abs: 2 10*3/uL (ref 0.7–4.0)
MCH: 30.8 pg (ref 26.0–34.0)
MCHC: 33 g/dL (ref 30.0–36.0)
MCV: 93.5 fL (ref 80.0–100.0)
Monocytes Absolute: 0.5 10*3/uL (ref 0.1–1.0)
Monocytes Relative: 7 %
Neutro Abs: 3.6 10*3/uL (ref 1.7–7.7)
Neutrophils Relative %: 58 %
Platelets: 193 10*3/uL (ref 150–400)
RBC: 4.28 MIL/uL (ref 3.87–5.11)
RDW: 13.1 % (ref 11.5–15.5)
WBC: 6.2 10*3/uL (ref 4.0–10.5)
nRBC: 0 % (ref 0.0–0.2)

## 2021-09-28 LAB — TYPE AND SCREEN
ABO/RH(D): A POS
Antibody Screen: NEGATIVE

## 2021-09-29 LAB — CEA: CEA: 3.6 ng/mL (ref 0.0–4.7)

## 2021-10-04 ENCOUNTER — Telehealth: Payer: Self-pay

## 2021-10-04 NOTE — Telephone Encounter (Signed)
Called and spoke with Maureen [name]. Answered several questions regarding gyn onc prep and PAT prep that she was given.

## 2021-10-05 ENCOUNTER — Ambulatory Visit: Payer: Medicare HMO | Admitting: Certified Registered"

## 2021-10-05 ENCOUNTER — Ambulatory Visit
Admission: RE | Admit: 2021-10-05 | Discharge: 2021-10-05 | Disposition: A | Discharge status: Home / Self Care | Payer: Medicare HMO | Attending: Obstetrics and Gynecology | Admitting: Obstetrics and Gynecology

## 2021-10-05 ENCOUNTER — Other Ambulatory Visit: Payer: Self-pay

## 2021-10-05 ENCOUNTER — Encounter
Admission: RE | Disposition: A | Discharge status: Home / Self Care | Payer: Self-pay | Source: Home / Self Care | Attending: Obstetrics and Gynecology

## 2021-10-05 ENCOUNTER — Encounter: Payer: Self-pay | Admitting: Obstetrics and Gynecology

## 2021-10-05 DIAGNOSIS — D270 Benign neoplasm of right ovary: Secondary | ICD-10-CM | POA: Insufficient documentation

## 2021-10-05 DIAGNOSIS — N838 Other noninflammatory disorders of ovary, fallopian tube and broad ligament: Secondary | ICD-10-CM | POA: Diagnosis present

## 2021-10-05 DIAGNOSIS — Z8711 Personal history of peptic ulcer disease: Secondary | ICD-10-CM | POA: Insufficient documentation

## 2021-10-05 DIAGNOSIS — F17200 Nicotine dependence, unspecified, uncomplicated: Secondary | ICD-10-CM | POA: Diagnosis not present

## 2021-10-05 DIAGNOSIS — D271 Benign neoplasm of left ovary: Secondary | ICD-10-CM | POA: Insufficient documentation

## 2021-10-05 HISTORY — PX: LAPAROSCOPIC BILATERAL SALPINGO OOPHERECTOMY: SHX5890

## 2021-10-05 LAB — ABO/RH: ABO/RH(D): A POS

## 2021-10-05 SURGERY — SALPINGO-OOPHORECTOMY, BILATERAL, LAPAROSCOPIC
Anesthesia: General | Laterality: Bilateral

## 2021-10-05 MED ORDER — ACETAMINOPHEN 500 MG PO TABS
1000.0000 mg | ORAL_TABLET | ORAL | Status: AC
  Administered 2021-10-05: 1000 mg via ORAL

## 2021-10-05 MED ORDER — ROCURONIUM BROMIDE 100 MG/10ML IV SOLN
INTRAVENOUS | Status: DC | PRN
  Administered 2021-10-05: 40 mg via INTRAVENOUS

## 2021-10-05 MED ORDER — MIDAZOLAM HCL 2 MG/2ML IJ SOLN
INTRAMUSCULAR | Status: AC
  Filled 2021-10-05: qty 2

## 2021-10-05 MED ORDER — LIDOCAINE HCL (CARDIAC) PF 100 MG/5ML IV SOSY
PREFILLED_SYRINGE | INTRAVENOUS | Status: DC | PRN
  Administered 2021-10-05: 80 mg via INTRAVENOUS

## 2021-10-05 MED ORDER — HEPARIN SODIUM (PORCINE) 5000 UNIT/ML IJ SOLN: 5000.0000 [IU] | Freq: Once | INTRAMUSCULAR | Status: AC

## 2021-10-05 MED ORDER — FENTANYL CITRATE (PF) 100 MCG/2ML IJ SOLN
INTRAMUSCULAR | Status: AC
  Administered 2021-10-05: 25 ug via INTRAVENOUS
  Filled 2021-10-05: qty 2

## 2021-10-05 MED ORDER — LACTATED RINGERS IV SOLN: INTRAVENOUS | Status: DC

## 2021-10-05 MED ORDER — SUGAMMADEX SODIUM 200 MG/2ML IV SOLN
INTRAVENOUS | Status: DC | PRN
  Administered 2021-10-05: 400 mg via INTRAVENOUS

## 2021-10-05 MED ORDER — OXYCODONE HCL 5 MG/5ML PO SOLN: 5.0000 mg | Freq: Once | ORAL | Status: AC | PRN

## 2021-10-05 MED ORDER — IBUPROFEN 600 MG PO TABS: 600.0000 mg | ORAL_TABLET | Freq: Three times a day (TID) | ORAL | 0 refills | Status: DC | PRN

## 2021-10-05 MED ORDER — GLYCOPYRROLATE 0.2 MG/ML IJ SOLN
INTRAMUSCULAR | Status: DC | PRN
  Administered 2021-10-05: .2 mg via INTRAVENOUS

## 2021-10-05 MED ORDER — CHLORHEXIDINE GLUCONATE CLOTH 2 % EX PADS
6.0000 | MEDICATED_PAD | Freq: Once | CUTANEOUS | Status: AC
  Administered 2021-10-05: 6 via TOPICAL

## 2021-10-05 MED ORDER — CHLORHEXIDINE GLUCONATE 0.12 % MT SOLN: 15.0000 mL | Freq: Once | OROMUCOSAL | Status: AC

## 2021-10-05 MED ORDER — MIDAZOLAM HCL 2 MG/2ML IJ SOLN
INTRAMUSCULAR | Status: DC | PRN
  Administered 2021-10-05: 2 mg via INTRAVENOUS

## 2021-10-05 MED ORDER — PROPOFOL 1000 MG/100ML IV EMUL
INTRAVENOUS | Status: AC
  Filled 2021-10-05: qty 100

## 2021-10-05 MED ORDER — PROPOFOL 10 MG/ML IV BOLUS
INTRAVENOUS | Status: DC | PRN
  Administered 2021-10-05: 100 mg via INTRAVENOUS

## 2021-10-05 MED ORDER — ONDANSETRON HCL 4 MG/2ML IJ SOLN
INTRAMUSCULAR | Status: DC | PRN
  Administered 2021-10-05 (×2): 4 mg via INTRAVENOUS

## 2021-10-05 MED ORDER — HEPARIN SODIUM (PORCINE) 5000 UNIT/ML IJ SOLN
INTRAMUSCULAR | Status: AC
  Administered 2021-10-05: 5000 [IU] via SUBCUTANEOUS
  Filled 2021-10-05: qty 1

## 2021-10-05 MED ORDER — LABETALOL HCL 5 MG/ML IV SOLN
INTRAVENOUS | Status: DC | PRN
  Administered 2021-10-05: 5 mg via INTRAVENOUS

## 2021-10-05 MED ORDER — OXYCODONE HCL 5 MG PO TABS
ORAL_TABLET | ORAL | Status: AC
  Filled 2021-10-05: qty 1

## 2021-10-05 MED ORDER — HYDROMORPHONE HCL 1 MG/ML IJ SOLN
INTRAMUSCULAR | Status: DC | PRN
  Administered 2021-10-05: .5 mg via INTRAVENOUS

## 2021-10-05 MED ORDER — DEXMEDETOMIDINE HCL IN NACL 80 MCG/20ML IV SOLN
INTRAVENOUS | Status: AC
  Filled 2021-10-05: qty 20

## 2021-10-05 MED ORDER — LIDOCAINE HCL (PF) 2 % IJ SOLN
INTRAMUSCULAR | Status: AC
  Filled 2021-10-05: qty 15

## 2021-10-05 MED ORDER — DEXAMETHASONE SODIUM PHOSPHATE 10 MG/ML IJ SOLN
INTRAMUSCULAR | Status: DC | PRN
  Administered 2021-10-05: 10 mg via INTRAVENOUS

## 2021-10-05 MED ORDER — BUPIVACAINE HCL (PF) 0.5 % IJ SOLN
INTRAMUSCULAR | Status: AC
Start: 2021-10-05 — End: ?
  Filled 2021-10-05: qty 30

## 2021-10-05 MED ORDER — EPHEDRINE SULFATE (PRESSORS) 50 MG/ML IJ SOLN
INTRAMUSCULAR | Status: DC | PRN
  Administered 2021-10-05: 5 mg via INTRAVENOUS

## 2021-10-05 MED ORDER — HYDROCODONE-ACETAMINOPHEN 5-325 MG PO TABS: 1.0000 | ORAL_TABLET | Freq: Four times a day (QID) | ORAL | 0 refills | Status: DC | PRN

## 2021-10-05 MED ORDER — FENTANYL CITRATE (PF) 100 MCG/2ML IJ SOLN: 25.0000 ug | INTRAMUSCULAR | Status: DC | PRN

## 2021-10-05 MED ORDER — DEXMEDETOMIDINE HCL IN NACL 200 MCG/50ML IV SOLN
INTRAVENOUS | Status: DC | PRN
  Administered 2021-10-05: 8 ug via INTRAVENOUS
  Administered 2021-10-05: 12 ug via INTRAVENOUS

## 2021-10-05 MED ORDER — FENTANYL CITRATE (PF) 100 MCG/2ML IJ SOLN
INTRAMUSCULAR | Status: AC
  Filled 2021-10-05: qty 2

## 2021-10-05 MED ORDER — BUPIVACAINE HCL (PF) 0.5 % IJ SOLN
INTRAMUSCULAR | Status: DC | PRN
  Administered 2021-10-05: 10 mL

## 2021-10-05 MED ORDER — ORAL CARE MOUTH RINSE: 15.0000 mL | Freq: Once | OROMUCOSAL | Status: AC

## 2021-10-05 MED ORDER — 0.9 % SODIUM CHLORIDE (POUR BTL) OPTIME
TOPICAL | Status: DC | PRN
  Administered 2021-10-05: 1000 mL

## 2021-10-05 MED ORDER — ACETAMINOPHEN 500 MG PO TABS
ORAL_TABLET | ORAL | Status: AC
  Filled 2021-10-05: qty 2

## 2021-10-05 MED ORDER — SUCCINYLCHOLINE CHLORIDE 200 MG/10ML IV SOSY
PREFILLED_SYRINGE | INTRAVENOUS | Status: DC | PRN
  Administered 2021-10-05: 100 mg via INTRAVENOUS

## 2021-10-05 MED ORDER — HYDROMORPHONE HCL 1 MG/ML IJ SOLN
INTRAMUSCULAR | Status: AC
  Filled 2021-10-05: qty 1

## 2021-10-05 MED ORDER — METHYLENE BLUE 1 % INJ SOLN
INTRAVENOUS | Status: AC
  Filled 2021-10-05: qty 10

## 2021-10-05 MED ORDER — PHENYLEPHRINE 80 MCG/ML (10ML) SYRINGE FOR IV PUSH (FOR BLOOD PRESSURE SUPPORT)
PREFILLED_SYRINGE | INTRAVENOUS | Status: DC | PRN
  Administered 2021-10-05: 80 ug via INTRAVENOUS
  Administered 2021-10-05: 160 ug via INTRAVENOUS
  Administered 2021-10-05: 80 ug via INTRAVENOUS

## 2021-10-05 MED ORDER — CHLORHEXIDINE GLUCONATE 0.12 % MT SOLN
OROMUCOSAL | Status: AC
  Administered 2021-10-05: 15 mL via OROMUCOSAL
  Filled 2021-10-05: qty 15

## 2021-10-05 MED ORDER — FENTANYL CITRATE (PF) 100 MCG/2ML IJ SOLN
INTRAMUSCULAR | Status: DC | PRN
  Administered 2021-10-05 (×2): 50 ug via INTRAVENOUS

## 2021-10-05 MED ORDER — PHENYLEPHRINE HCL-NACL 20-0.9 MG/250ML-% IV SOLN
INTRAVENOUS | Status: DC | PRN
  Administered 2021-10-05: 25 ug/min via INTRAVENOUS

## 2021-10-05 MED ORDER — FLEET ENEMA 7-19 GM/118ML RE ENEM
1.0000 | ENEMA | Freq: Once | RECTAL | Status: AC
  Administered 2021-10-05: 1 via RECTAL

## 2021-10-05 MED ORDER — OXYCODONE HCL 5 MG PO TABS
5.0000 mg | ORAL_TABLET | Freq: Once | ORAL | Status: AC | PRN
  Administered 2021-10-05: 5 mg via ORAL

## 2021-10-05 SURGICAL SUPPLY — 69 items
ADH SKN CLS APL DERMABOND .7 (GAUZE/BANDAGES/DRESSINGS) ×1
AGENT HMST KT MTR STRL THRMB (HEMOSTASIS)
APL ESCP 34 STRL LF DISP (HEMOSTASIS)
APL PRP STRL LF DISP 70% ISPRP (MISCELLANEOUS) ×1
APPLICATOR SURGIFLO ENDO (HEMOSTASIS) IMPLANT
BAG DRN RND TRDRP ANRFLXCHMBR (UROLOGICAL SUPPLIES) ×1
BAG URINE DRAIN 2000ML AR STRL (UROLOGICAL SUPPLIES) ×2 IMPLANT
BLADE SURG 15 STRL LF DISP TIS (BLADE) ×1 IMPLANT
BLADE SURG 15 STRL SS (BLADE) ×2
CANNULA DILATOR 5 W/SLV (CANNULA) ×2 IMPLANT
CATH FOLEY 2WAY  5CC 16FR (CATHETERS) ×2
CATH FOLEY 2WAY 5CC 16FR (CATHETERS) ×1
CATH URTH 16FR FL 2W BLN LF (CATHETERS) ×1 IMPLANT
CHLORAPREP W/TINT 26 (MISCELLANEOUS) ×2 IMPLANT
CORD MONOPOLAR M/FML 12FT (MISCELLANEOUS) ×2 IMPLANT
DEFOGGER SCOPE WARMER CLEARIFY (MISCELLANEOUS) ×2 IMPLANT
DERMABOND ADVANCED (GAUZE/BANDAGES/DRESSINGS) ×1
DERMABOND ADVANCED .7 DNX12 (GAUZE/BANDAGES/DRESSINGS) ×1 IMPLANT
DEVICE SUTURE ENDOST 10MM (ENDOMECHANICALS) ×2 IMPLANT
DRAPE STERI POUCH LG 24X46 STR (DRAPES) ×4 IMPLANT
ELECT BLADE 6.5 EXT (BLADE) ×1 IMPLANT
GAUZE 4X4 16PLY ~~LOC~~+RFID DBL (SPONGE) ×2 IMPLANT
GLOVE BIO SURGEON STRL SZ8 (GLOVE) ×8 IMPLANT
GLOVE SURG UNDER LTX SZ8 (GLOVE) ×2 IMPLANT
GOWN STRL REUS W/ TWL LRG LVL3 (GOWN DISPOSABLE) ×2 IMPLANT
GOWN STRL REUS W/ TWL XL LVL3 (GOWN DISPOSABLE) ×1 IMPLANT
GOWN STRL REUS W/TWL LRG LVL3 (GOWN DISPOSABLE) ×4
GOWN STRL REUS W/TWL XL LVL3 (GOWN DISPOSABLE) ×2
GRASPER SUT TROCAR 14GX15 (MISCELLANEOUS) ×2 IMPLANT
HANDLE YANKAUER SUCT BULB TIP (MISCELLANEOUS) ×1 IMPLANT
IRRIGATION STRYKERFLOW (MISCELLANEOUS) IMPLANT
IRRIGATOR STRYKERFLOW (MISCELLANEOUS) ×2
IV LACTATED RINGERS 1000ML (IV SOLUTION) ×2 IMPLANT
KIT PINK PAD W/HEAD ARE REST (MISCELLANEOUS) ×2
KIT PINK PAD W/HEAD ARM REST (MISCELLANEOUS) ×1 IMPLANT
LABEL OR SOLS (LABEL) ×2 IMPLANT
LIGASURE VESSEL 5MM BLUNT TIP (ELECTROSURGICAL) ×2 IMPLANT
MANIFOLD NEPTUNE II (INSTRUMENTS) ×2 IMPLANT
NDL INSUFF ACCESS 14 VERSASTEP (NEEDLE) ×2 IMPLANT
NDL SPNL 22GX5 LNG QUINC BK (NEEDLE) ×1 IMPLANT
NEEDLE SPNL 22GX5 LNG QUINC BK (NEEDLE) ×2 IMPLANT
NS IRRIG 500ML POUR BTL (IV SOLUTION) ×2 IMPLANT
OCCLUDER COLPOPNEUMO (BALLOONS) ×2 IMPLANT
PACK GYN LAPAROSCOPIC (MISCELLANEOUS) ×2 IMPLANT
PAD OB MATERNITY 4.3X12.25 (PERSONAL CARE ITEMS) ×2 IMPLANT
PAD PREP 24X41 OB/GYN DISP (PERSONAL CARE ITEMS) ×2 IMPLANT
PENCIL SMOKE EVACUATOR (MISCELLANEOUS) ×1 IMPLANT
POUCH ENDO CATCH II 15MM (MISCELLANEOUS) ×1 IMPLANT
SET CYSTO W/LG BORE CLAMP LF (SET/KITS/TRAYS/PACK) ×2 IMPLANT
SHEARS ENDO 5MM 31CM (CUTTER) ×2 IMPLANT
SPONGE T-LAP 18X18 ~~LOC~~+RFID (SPONGE) ×1 IMPLANT
SPONGE T-LAP 4X18 ~~LOC~~+RFID (SPONGE) ×1 IMPLANT
SURGIFLO W/THROMBIN 8M KIT (HEMOSTASIS) IMPLANT
SUT ENDO VLOC 180-0-8IN (SUTURE) ×2 IMPLANT
SUT MNCRL 4-0 (SUTURE) ×8
SUT MNCRL 4-0 27XMFL (SUTURE) ×4
SUT VIC AB 0 CT1 36 (SUTURE) ×4 IMPLANT
SUT VIC AB 2-0 UR6 27 (SUTURE) IMPLANT
SUT VIC AB 3-0 SH 27 (SUTURE) ×2
SUT VIC AB 3-0 SH 27X BRD (SUTURE) IMPLANT
SUTURE MNCRL 4-0 27XMF (SUTURE) ×2 IMPLANT
SYR 10ML LL (SYRINGE) ×2 IMPLANT
SYR 3ML LL SCALE MARK (SYRINGE) ×4 IMPLANT
SYR 50ML LL SCALE MARK (SYRINGE) ×4 IMPLANT
TROCAR BLUNT TIP 12MM OMST12BT (TROCAR) ×2 IMPLANT
TROCAR VERSASTEP PLUS 12MM (TROCAR) ×2 IMPLANT
TROCAR VERSASTEP PLUS 5MM (TROCAR) ×4 IMPLANT
TUBING EVAC SMOKE HEATED PNEUM (TUBING) ×2 IMPLANT
WATER STERILE IRR 500ML POUR (IV SOLUTION) ×2 IMPLANT

## 2021-10-05 NOTE — Op Note (Signed)
  Operative Note   10/05/2021 10:13 AM  PRE-OP DIAGNOSIS: PELVIC MASSES    POST-OP DIAGNOSIS: bilateral ovarian fibromas  SURGEON: Surgeon(s) and Role:    Mellody Drown, MD - Primary    * Will Bonnet, MD  ANESTHESIA: General   PROCEDURE: LAPAROSCOPIC BILATERAL SALPINGO OOPHORECTOMY, REMOVAL OF PELVIC MASSES, MINI LAPAROTOMY   ESTIMATED BLOOD LOSS: Minimal  DRAINS: none   TOTAL IV FLUIDS: per Anesthsia  SPECIMENS: Bilateral fallopian tubes and ovaries  COMPLICATIONS: none  DISPOSITION: PACU - hemodynamically stable.  CONDITION: stable  INDICATIONS: bilateral solid ovarian masses  FINDINGS:  Bilateral solid ovarian masses: left 8 cm, right 5 cm.  Uterus absent.  Abdominal survey negative.   PROCEDURE IN DETAIL: After informed consent was obtained, the patient was taken to the operating room where anesthesia was obtained without difficulty. The patient was positioned in the dorsal lithotomy position in [place] and her arms were carefully tucked at her sides and the usual precautions were taken.  She was prepped and draped in normal sterile fashion.  Time-out was performed and a Foley catheter was placed into the bladder without incident.    An open Hasson technique was used to place an supraumbilical 17-OH baloon trocar under direct visualization. The laparoscope was introduced and CO2 gas was infused for pneumoperitoneum to a pressure of 15 mm Hg.  Right and left lateral 5-mm ports and a 5-12 mm suprapubic port were placed under direct visualization of the laparoscope using an EndoStep technique.  Cytologic washings were obtained.  The patient was placed in Trendelenburg and the bowel was displaced up into the upper abdomen.    The pelvic sidewall peritoneum was opened and the infundibulopelvic ligaments were skeletonized and sealed and divided with the LigaSure device with care to identify the ureters.  Attachments of the masses to the vaginal cuff were taken  down and masses freed up.  Because the masses were solid it was not possible to morcellate them in a bag.  The larger mass was placed in a 15 endocatch bag and the bag brought out through the lower incision. The lower 5/12 trochar was removed and a small 8 cm mini laparotomy incision was made via the old low transverse incision.  The skin was incised with a scalpel. The fat and fascia were taken down with the bovie.The peritoneum was stretched and the endocatch bag was delivers through the incision.  The smaller right ovarian tumor was then removed manually.  Frozen section showed benign fibromas.  The fascia was then closed with running 0 Vicryl suture and the subcutaneous tissue with running 3-0 Vicryl. The skin was closed with a running subcuticular using 4-0 Monocryl.        The laparoscope was reinserted and hemostasis was observed. The intraperitoneal pressure was dropped, and all planes of dissection, vascular pedicles were found to be hemostatic.  The lateral trocar sites were closed with 2-0 Vicryl suture using the PMI. The Hasson  trocar was removed and the CO2 gas was released.  The fascia there was closed with 0 Vicryl suture.   The skin incisions were closed with subcuticular stitches and glue.  The patient tolerated the procedure well.  Sponge, lap and needle counts were correct x2.  The patient was taken to recovery room in excellent condition.  Antibiotics: not indicated  VTE prophylaxis: was ordered perioperatively.  Mellody Drown, MD

## 2021-10-05 NOTE — Anesthesia Postprocedure Evaluation (Signed)
Anesthesia Post Note  Patient: RUBLE BUTTLER  Procedure(s) Performed: LAPAROSCOPIC BILATERAL SALPINGO OOPHORECTOMY, REMOVAL OF PELVIC MASSES, MINI LAPAROTOMY (Bilateral)  Patient location during evaluation: PACU Anesthesia Type: General Level of consciousness: awake and alert Pain management: pain level controlled Vital Signs Assessment: post-procedure vital signs reviewed and stable Respiratory status: spontaneous breathing, nonlabored ventilation, respiratory function stable and patient connected to nasal cannula oxygen Cardiovascular status: blood pressure returned to baseline and stable Postop Assessment: no apparent nausea or vomiting Anesthetic complications: no   No notable events documented.   Last Vitals:  Vitals:   10/05/21 1125 10/05/21 1201  BP: (!) 100/56 117/60  Pulse: 73 77  Resp: 16   Temp: (!) 36.1 C   SpO2: 95% 94%    Last Pain:  Vitals:   10/05/21 1209  TempSrc:   PainSc: 4                  Precious Haws Quayshaun Hubbert

## 2021-10-05 NOTE — Transfer of Care (Signed)
Immediate Anesthesia Transfer of Care Note  Patient: Maureen [name]  Procedure(s) Performed: LAPAROSCOPIC BILATERAL SALPINGO OOPHORECTOMY, REMOVAL OF PELVIC MASSES, MINI LAPAROTOMY (Bilateral)  Patient Location: PACU  Anesthesia Type:General  Level of Consciousness: awake, drowsy and patient cooperative  Airway & Oxygen Therapy: Patient Spontanous Breathing and Patient connected to face mask oxygen  Post-op Assessment: Report given to RN and Post -op Vital signs reviewed and stable  Post vital signs: Reviewed and stable  Last Vitals:  Vitals Value Taken Time  BP    Temp    Pulse    Resp    SpO2      Last Pain:  Vitals:   10/05/21 0629  TempSrc: Oral  PainSc: 0-No pain         Complications: No notable events documented.

## 2021-10-05 NOTE — Discharge Instructions (Signed)

## 2021-10-05 NOTE — Anesthesia Procedure Notes (Signed)

## 2021-10-05 NOTE — Progress Notes (Signed)
Gynecologic Oncology history and physical  Referring Provider: Dr Prentice Docker  Chief Concern: ovarian masses  Subjective:  Maureen [name] is a 76 y.o. female who is seen in consultation from Dr. Glennon Mac for solid ovarian masses.  Patient is a 76 year old smoker with history of bilateral breast cancer s/p mastectomy, emphysema COPD abdominal aortic aneurysm.  Patient also has discoid lupus.  She has breast status post bilateral mastectomies also had chemotherapy for her breast cancer.  She was picked up a screening CT scan to have a new solid nodule in the right upper lobe measuring 9.8 mm. Saw Dr Baruch Gouty 3/23 who discussed with Pulmonology.  She is not a suitable candidate for biopsy either with CT-guided fine-needle or navigational bronc.  He believed the best strategy would be to wait another 2 months and perform a PET CT scan.  Should there be hypermetabolic activity would offer SBRT 60 Gray in 5 fractions.    PET/CT IMPRESSION: 1. Bilateral pulmonary nodules are again noted which are all technically too small to reliably characterize by PET-CT. 2. The previously characterized new solid nodule in the left upper lobe measuring 5.8 mm has an SUV max of 1.97. Differential considerations for this degree of uptake include inflammatory/infectious change versus early malignancy. 3. The previously characterized new part solid nodule within the right upper lobe is difficult to visualize on today's study and may have resolved in the interval. 4. Several new small nodules are identified within the right upper lobe, right lower lobe and left upper lobe which are nonspecific and exhibit mild SUV max (less than 1.0). 5. Recommend repeat Lung cancer screening CT in 3 months to assess for temporal change in the appearance of these lung nodules which are likely too small to successfully biopsy. 6. No signs of FDG avid nodal metastasis or distant metastatic disease. 7. Increased uptake within the distal  esophagus is noted, nonspecific and may be related to esophagitis. 8. Infrarenal abdominal aortic aneurysm measures 4.6 cm. Recommend follow-up CT/MR every 6 months and vascular consultation. This recommendation follows ACR consensus guidelines: White Paper of the ACR Incidental Findings Committee II on Vascular Findings. J Am Coll Radiol 2013; 10:789-794. 9. Aortic Atherosclerosis (ICD10-I70.0) and Emphysema (ICD10-J43.9). Coronary artery calcifications.  Within the right adnexa there is a solid-appearing mass measuring 5.4 x 5.2 cm with SUV max 2.44. Within the left adnexa there is a solid-appearing mass measuring 6.5 x 6.9 cm with SUV max of 2.6, image 172/4. The patient is status post hysterectomy  Addendum There are bilateral indeterminate solid-appearing adnexal masses. These are indeterminate. There is no mention of this finding on remote pelvic ultrasound from 11/17/2008. Initial evaluation with pelvic sonogram is recommended. Alternatively contrast enhanced pelvic MRI may provide a more definitive characterization.   She saw Dr Glennon Mac for consult. Pelvic US 08/31/21  Indications: Pelvic masses Findings:  The uterus is absent.  Right Ovary measures not definitively visualized. Left Ovary measures not definitively visualized.  Two masses noted in the pelvic region. Both have mixed echogenicity and both appear to be solid.  There are areas of increased density with possible blood flow in some areas on doppler interrogation.   They measure: Left: 3.58 x 4.39 x 3.23 cm Right: 4.51 x 3.50 x 3.57 cm  There is no free fluid in the cul de sac.  She is asymptomatic with no pelvic pain or bleeding.  Some urinary pressure.  Vaginal hysterectomy in later 20s due to abnormal PAPs   Problem List: Patient Active  Problem List   Diagnosis Date Noted   Atherosclerosis of aortic bifurcation and common iliac arteries ([place]) 10/22/2020   Abdominal aortic aneurysm (AAA) without rupture ([place])  11/11/2018   History of breast cancer 11/11/2018   COPD (chronic obstructive pulmonary disease) with chronic bronchitis ([place]) 06/19/2018   Skin lesion of chest wall 06/15/2016   Vitamin D deficiency 05/29/2016   Tobacco abuse 05/25/2016   Controlled substance agreement signed 05/25/2016   Advance care planning 05/25/2016   History of colonic polyps 06/12/2014   Esophageal reflux 06/12/2014   Hyperlipidemia 05/11/2014   Lupus ([place]) 04/16/2013   Generalized anxiety disorder 04/16/2013   Personal history of other malignant neoplasm of skin 03/29/2012    Past Medical History: Past Medical History:  Diagnosis Date   Abdominal aortic aneurysm (AAA) without rupture ([place])    Arthritis 2005   Breast cancer, left breast ([place]) 2011   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 This was a histologic grade 2 tumor measuring just under 1 cm in diameter. No invasive cancer was identified. ER 90%, PR 40%.   Breast cancer, right breast ([place]) 1994   Invasive ductal carcinoma.The patient underwent right mastectomy in 1994 for invasive ductal carcinoma.   COPD (chronic obstructive pulmonary disease) ([place])    Discoid lupus 2005   Discoid lupus 2005   Emphysematous bleb ([facility])    GERD (gastroesophageal reflux disease)    Heart murmur 2005   Hypercholesterolemia 2012   Mammographic microcalcification 2011   Panic attack    Personal history of chemotherapy 1994   Personal history of colonic polyps    Colon polyps   Personal history of other malignant neoplasm of skin 2015   basal cell   Personal history of tobacco use, presenting hazards to health    PUD (peptic ulcer disease)    Last EGD 1/09   Pulmonary nodule    Rectal polyp 2008   villous adenoma; last colonoscopy 1/09 WNL   Skin cancer, basal cell 2015   right cheek Mohs surgery at [facility] use    Ulcer     Past Surgical History: Past Surgical History:  Procedure Laterality Date   APPENDECTOMY   2011   BREAST BIOPSY  12/27/2009   right breast   CARDIAC CATHETERIZATION  1996   Negative Cath   COLONOSCOPY N/A 08/05/2014   Procedure: COLONOSCOPY;  Surgeon: [name], MD;  Location: [facility] ENDOSCOPY;  EGD, tubular adenoma 1.   COLONOSCOPY W/ BIOPSIES  2005,2009   Dr. Bary Castilla. Normal exam 2009. Villous adenoma of the rectum in 2005 adenomatous polyp from the ascending colon and rectum in 2002.   ESOPHAGOGASTRODUODENOSCOPY N/A 08/05/2014   Procedure: ESOPHAGOGASTRODUODENOSCOPY (EGD);  Surgeon: [name], MD;  Location: [facility] ENDOSCOPY;  Service: Endoscopy;  Laterality: N/A;   ESOPHAGOGASTRODUODENOSCOPY (EGD) WITH PROPOFOL N/A 07/25/2017   Procedure: ESOPHAGOGASTRODUODENOSCOPY (EGD) WITH PROPOFOL;  Surgeon: [name], MD;  Location: [facility] ENDOSCOPY;  Service: Endoscopy;  Laterality: N/A;   ESOPHAGOGASTRODUODENOSCOPY (EGD) WITH PROPOFOL N/A 08/03/2021   Procedure: ESOPHAGOGASTRODUODENOSCOPY (EGD) WITH PROPOFOL;  Surgeon: [name], MD;  Location: [facility] ENDOSCOPY;  Service: Endoscopy;  Laterality: N/A;   MASTECTOMY     bilateral   SIMPLE MASTECTOMY  1993   right   SIMPLE MASTECTOMY  2011   left   TONSILLECTOMY  1997   UPPER GI ENDOSCOPY  2003, 2005, 2009, 2012, 2014   2012 showed mild reflux changes without evidence of Barrett's epithelial changes  2014 biopsies at 37 cm showed changes suggestive of reflux esophagitis. No metaplasia.   VAGINAL HYSTERECTOMY  1977    Past Gynecologic History:  Post menopausal.     OB History:  OB History  Gravida Para Term Preterm AB Living  2 2 2     2   SAB IAB Ectopic Multiple Live Births               # Outcome Date GA Lbr Len/2nd Weight Sex Delivery Anes PTL Lv  2 Term           1 Term             Obstetric Comments  Age with first menstruation-15  Age with first pregnancy-24  LMP-1977, hysterectomy    Family History: Family History  Problem Relation Age of Onset   Cancer Father        esophagus   Cancer  Maternal Aunt        breast cancer   Cancer Maternal Grandmother        breast cancer    Social History: Social History   Socioeconomic History   Marital status: Widowed    Spouse name: Not on file   Number of children: Not on file   Years of education: Not on file   Highest education level: Not on file  Occupational History   Occupation: Optician, dispensing: [employer]  Tobacco Use   Smoking status: Every Day    Packs/day: 0.50    Years: 40.00    Total pack years: 20.00    Types: Cigarettes   Smokeless tobacco: Never   Tobacco comments:    Pt wants to do hypnotizing  Vaping Use   Vaping Use: Never used  Substance and Sexual Activity   Alcohol use: Not Currently    Alcohol/week: 1.0 standard drink of alcohol    Types: 1 Cans of beer per week   Drug use: No   Sexual activity: Not Currently  Other Topics Concern   Not on file  Social History Narrative   Persis grew up in [place]. She has two adult daughters. She lives in her home. Her youngest daughter and grandchildren live with her. She has two dogs. 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 She loves watching her grandson play basketball. She also loves gardening and being outdoors and working in the yard.    Social Determinants of Health   Financial Resource Strain: Not on file  Food Insecurity: Not on file  Transportation Needs: Not on file  Physical Activity: Not on file  Stress: Not on file  Social Connections: Not on file  Intimate Partner Violence: Not on file    Allergies: Allergies  Allergen Reactions   Codeine Itching   Incruse Ellipta [Umeclidinium Bromide] Other (See Comments)    Oral sores   Sulfa Antibiotics Hives   Sulfinpyrazone    Erythromycin Rash   Penicillins Rash    Current Medications: Current Outpatient Medications  Medication Sig Dispense Refill   acetaminophen (TYLENOL) 500 MG  tablet Take 500 mg by mouth every 6 (six) hours as needed for headache.     albuterol (PROVENTIL) (2.5 MG/3ML) 0.083% nebulizer solution Take 3 mLs (2.5 mg total) by nebulization every 6 (six) hours as needed for wheezing or shortness of breath. 150 mL 1   albuterol (VENTOLIN HFA) 108 (90 Base) MCG/ACT inhaler Inhale 1-2 puffs into  the lungs every 6 (six) hours as needed for wheezing or shortness of breath. 18 each 6   ALPRAZolam (XANAX) 0.5 MG tablet Take 1 tablet (0.5 mg total) by mouth 2 (two) times daily as needed for anxiety. 45 tablet 1   Ascorbic Acid (VITAMIN C) 100 MG tablet Take 100 mg by mouth daily.     budesonide-formoterol (SYMBICORT) 160-4.5 MCG/ACT inhaler Inhale 2 puffs into the lungs 2 (two) times daily. 3 each 3   Calcium-Vitamins C & D (CALCIUM/C/D PO) Take by mouth daily.     cetirizine (ZYRTEC) 10 MG tablet Take 10 mg by mouth daily.     Cholecalciferol (VITAMIN D3) 50 MCG (2000 UT) TABS Take by mouth.     clobetasol cream (TEMOVATE) 5.46 % Apply 1 application topically daily.     Homeopathic Products (ZICAM ALLERGY RELIEF NA) Place 2 sprays into the nose daily as needed (allergies).      hydroxychloroquine (PLAQUENIL) 200 MG tablet Take by mouth daily. (Patient not taking: Reported on 08/08/2021)     KRILL OIL PO Take by mouth daily.     montelukast (SINGULAIR) 10 MG tablet TAKE 1 TABLET BY MOUTH EVERYDAY AT BEDTIME 90 tablet 1   Multiple Vitamin (MULTIVITAMIN PO) Take by mouth daily.     omeprazole (PRILOSEC) 20 MG capsule Take 1 capsule (20 mg total) by mouth daily. 90 capsule 1   vitamin E 100 UNIT capsule Take by mouth daily.     zinc gluconate 50 MG tablet Take 50 mg by mouth every other day.     No current facility-administered medications for this visit.   General: negative for fevers, changes in weight or night sweats Skin: negative for changes in moles or sores or rash Eyes: negative for changes in vision HEENT: negative for change in hearing, tinnitus, voice  changes. Positive for seasonal allergies Pulmonary: positive for cough Cardiac: negative for palpitations, pain Gastrointestinal: positive for abdominal pain Genitourinary/Sexual: positive for stress incontinence Ob/Gyn:  positive for pelvic pain Musculoskeletal: negative for pain, joint pain, back pain Hematology: negative for easy bruising, abnormal bleeding Neurologic/Psych: negative for headaches, seizures, paralysis, weakness, numbness. Positive for depression & anxiety  Objective:  Physical Examination:  BP 136/82   Pulse 84   Resp 18   Wt 152 lb (68.9 kg)   BMI 26.93 kg/m    ECOG Performance Status: 1 - Symptomatic but completely ambulatory  General appearance: alert, cooperative, and appears stated age HEENT:extra ocular movement intact, thyroid without masses, and trachea midline Lymph node survey: non-palpable, axillary, inguinal, supraclavicular Cardiovascular: regular rate and rhythm, no murmurs or gallops Respiratory: normal air entry, lungs clear to auscultation and no rales, rhonchi or wheezing Abdomen: soft, non-tender, without masses or organomegaly, no hernias, and well healed incision Back: inspection of back is normal Extremities: extremities normal, atraumatic, no cyanosis or edema Skin exam - normal coloration and turgor, no rashes, no suspicious skin lesions noted. Neurological exam reveals alert, oriented, normal speech, no focal findings or movement disorder noted.  Pelvic: exam chaperoned by nurse;  Vulva: normal appearing vulva with no masses, tenderness or lesions; Vagina: normal vagina; Adnexa; hard mass palpable above vagina. Rectal: confirms.    Lab Review Lab Results  Component Value Date   WBC 5.5 01/27/2021   HGB 14.2 01/27/2021   HCT 42.3 01/27/2021   MCV 96 01/27/2021   PLT 174 01/27/2021      Chemistry      Component Value Date/Time   NA 141 01/27/2021 0000  K 4.7 01/27/2021 0000   CL 108 (H) 01/27/2021 0000   CO2 22 01/27/2021  0000   BUN 16 01/27/2021 0000   CREATININE 1.09 (H) 01/27/2021 0000   GLU 92 01/02/2014 0000      Component Value Date/Time   CALCIUM 10.1 01/27/2021 0000   ALKPHOS 89 01/27/2021 0000   AST 22 01/27/2021 0000   ALT 26 01/27/2021 0000   BILITOT 0.3 01/27/2021 0000         Assessment:  Maureen [name] is a 76 y.o. female diagnosed with bilateral 6-7 cm solid ovarian masses on PET/CT done for evaluation of lung nodule.  Indeterminate on PET based on low SUV. Suspect these are benign and differential includes fibroadenoma or sex cord stromal tumors.  Smoker with indeterminate lung nodules.  Considering SBRT for possible lung cancer, but plan for now is to continue close follow up.  She has COPD, but seems fairly functional and is able to climb stairs.   Medical co-morbidities complicating care: COPD, breast cancer, lupus.  Plan:   Problem List Items Addressed This Visit       Other   Bilateral tubo-ovarian mass    We discussed options for management including surgery and she is scheduled for laparoscopic BSO, possible pelvic/aortic LN biopsies, possible laparotomy 10/05/21. Will check pre op labs as well as CEA, CA125 and inhibin.   Will check with pulmonary, but think she is a good surgical candidate.  Walks up stairs with no problems.  The risks of surgery were discussed in detail and she understands these to include infection; wound separation; hernia; vaginal cuff separation, injury to adjacent organs such as bowel, bladder, blood vessels, ureters and nerves; bleeding which may require blood transfusion; anesthesia risk; thromboembolic events; possible death; unforeseen complications; possible need for re-exploration; medical complications such as heart attack, stroke, pleural effusion and pneumonia; and, if staging performed the risk of lymphedema and lymphocyst.  The patient will receive DVT and antibiotic prophylaxis as indicated.  She voiced a clear understanding.  She had the  opportunity to ask questions and written informed consent was obtained today.   Suggested return to clinic post op.     The patient's diagnosis, an outline of the further diagnostic and laboratory studies which will be required, the recommendation, and alternatives were discussed.  All questions were answered to the patient's satisfaction.  A total of 60 minutes were spent with the patient/family today; 50% was spent in education, counseling and coordination of care for solid ovarian masses.    Mellody Drown, MD   CC:  Will Bonnet, MD [address] [place],  [place] [address] [phone]

## 2021-10-05 NOTE — Anesthesia Preprocedure Evaluation (Signed)
Anesthesia Evaluation  Patient identified by MRN, date of birth, ID band Patient awake    Reviewed: Allergy & Precautions, NPO status , Patient's Chart, lab work & pertinent test results  History of Anesthesia Complications Negative for: history of anesthetic complications  Airway Mallampati: III  TM Distance: <3 FB Neck ROM: full    Dental  (+) Chipped, Missing   Pulmonary shortness of breath and with exertion, COPD, Current Smoker and Patient abstained from smoking.,    Pulmonary exam normal        Cardiovascular Exercise Tolerance: Good (-) angina(-) Past MI and (-) DOE negative cardio ROS Normal cardiovascular exam     Neuro/Psych PSYCHIATRIC DISORDERS negative neurological ROS     GI/Hepatic Neg liver ROS, PUD, GERD  Controlled,  Endo/Other  negative endocrine ROS  Renal/GU      Musculoskeletal   Abdominal   Peds  Hematology negative hematology ROS (+)   Anesthesia Other Findings Past Medical History: No date: Abdominal aortic aneurysm (AAA) without rupture ([place])     Comment:  followed by Dr Delana Meyer No date: Abnormal EKG     Comment:  pt states ekg always shows she is having a heart attack 2005: Arthritis 2011: Breast cancer, left breast [facility])     Comment:  Left simple mastectomy completed on February 07, 2010               for DCIS. This was a histologic grade 2 tumor measuring               just under 1 cm in diameter. No invasive cancer was               identified. ER 90%, PR 40%. 1994: Breast cancer, right breast ([place])     Comment:  Invasive ductal carcinoma.The patient underwent right               mastectomy in 1994 for invasive ductal carcinoma. No date: Bronchitis No date: COPD (chronic obstructive pulmonary disease) ([place]) 2005: Discoid lupus 2005: Discoid lupus No date: Emphysematous bleb ([facility]) No date: GERD (gastroesophageal reflux disease) 2005: Heart murmur 2012:  Hypercholesterolemia 2011: Mammographic microcalcification No date: Panic attack 1994: Personal history of chemotherapy No date: Personal history of colonic polyps     Comment:  Colon polyps 2015: Personal history of other malignant neoplasm of skin     Comment:  basal cell No date: Personal history of tobacco use, presenting hazards to health No date: PUD (peptic ulcer disease)     Comment:  Last EGD 1/09 No date: Pulmonary nodule 2008: Rectal polyp     Comment:  villous adenoma; last colonoscopy 1/09 WNL 2015: Skin cancer, basal cell     Comment:  right cheek Mohs surgery at [facility] No date: Tobacco use No date: Ulcer  Past Surgical History: 2011: APPENDECTOMY 12/27/2009: BREAST BIOPSY     Comment:  right breast 1996: CARDIAC CATHETERIZATION     Comment:  Negative Cath 08/05/2014: COLONOSCOPY; N/A     Comment:  Procedure: COLONOSCOPY;  Surgeon: [name], MD;              Location: [facility] ENDOSCOPY;  EGD, tubular adenoma 1. 1856,3149: COLONOSCOPY W/ BIOPSIES     Comment:  Dr. Bary Castilla. Normal exam 2009. Villous adenoma of the               rectum in 2005 adenomatous polyp from the ascending colon  and rectum in 2002. 08/05/2014: ESOPHAGOGASTRODUODENOSCOPY; N/A     Comment:  Procedure: ESOPHAGOGASTRODUODENOSCOPY (EGD);  Surgeon:               [name], MD;  Location: [facility] ENDOSCOPY;                Service: Endoscopy;  Laterality: N/A; 07/25/2017: ESOPHAGOGASTRODUODENOSCOPY (EGD) WITH PROPOFOL; N/A     Comment:  Procedure: ESOPHAGOGASTRODUODENOSCOPY (EGD) WITH               PROPOFOL;  Surgeon: [name], MD;  Location:               [facility] ENDOSCOPY;  Service: Endoscopy;  Laterality: N/A; 08/03/2021: ESOPHAGOGASTRODUODENOSCOPY (EGD) WITH PROPOFOL; N/A     Comment:  Procedure: ESOPHAGOGASTRODUODENOSCOPY (EGD) WITH               PROPOFOL;  Surgeon: [name], MD;  Location:               [facility] ENDOSCOPY;  Service: Endoscopy;   Laterality: N/A; No date: MASTECTOMY     Comment:  bilateral 1993: SIMPLE MASTECTOMY     Comment:  right 2011: SIMPLE MASTECTOMY     Comment:  left 1997: TONSILLECTOMY 2003, 2005, 2009, 2012, 2014: UPPER GI ENDOSCOPY     Comment:  2012 showed mild reflux changes without evidence of               Barrett's epithelial changes 2014 biopsies at 37 cm               showed changes suggestive of reflux esophagitis. No               metaplasia. 1977: VAGINAL HYSTERECTOMY     Reproductive/Obstetrics negative OB ROS                             Anesthesia Physical Anesthesia Plan  ASA: 3  Anesthesia Plan: General ETT   Post-op Pain Management:    Induction: Intravenous  PONV Risk Score and Plan: Ondansetron, Dexamethasone, Midazolam and Treatment may vary due to age or medical condition  Airway Management Planned: Oral ETT  Additional Equipment:   Intra-op Plan:   Post-operative Plan: Extubation in OR  Informed Consent: I have reviewed the patients History and Physical, chart, labs and discussed the procedure including the risks, benefits and alternatives for the proposed anesthesia with the patient or authorized representative who has indicated his/her understanding and acceptance.     Dental Advisory Given  Plan Discussed with: Anesthesiologist, CRNA and Surgeon  Anesthesia Plan Comments: (Patient consented for risks of anesthesia including but not limited to:  - adverse reactions to medications - damage to eyes, teeth, lips or other oral mucosa - nerve damage due to positioning  - sore throat or hoarseness - Damage to heart, brain, nerves, lungs, other parts of body or loss of life  Patient voiced understanding.)        Anesthesia Quick Evaluation

## 2021-10-05 NOTE — Consult Note (Signed)
Patient seen and examined.  No change in plan for LS BSO for bilateral ovarian masses.   Mellody Drown, MD

## 2021-10-06 LAB — SURGICAL PATHOLOGY

## 2021-10-06 LAB — CYTOLOGY - NON PAP

## 2021-11-08 ENCOUNTER — Ambulatory Visit: Payer: Medicare HMO | Admitting: Family Medicine

## 2021-11-08 ENCOUNTER — Encounter: Payer: Self-pay | Admitting: Family Medicine

## 2021-11-08 DIAGNOSIS — F411 Generalized anxiety disorder: Secondary | ICD-10-CM

## 2021-11-08 MED ORDER — ALPRAZOLAM 0.5 MG PO TABS
0.5000 mg | ORAL_TABLET | Freq: Two times a day (BID) | ORAL | 1 refills | Status: DC | PRN
Start: 2021-11-08 — End: 2022-02-08

## 2021-11-08 NOTE — Progress Notes (Signed)
BP 109/69   Pulse (!) 108   Temp 98.2 F (36.8 C)   Wt 144 lb (65.3 kg)   SpO2 96%   BMI 25.51 kg/m    Subjective:    Patient ID: Maureen [name], female    DOB: [birth year], 76 y.o.   MRN: [id number]  HPI: Maureen [name] is a 76 y.o. female  Chief Complaint  Patient presents with   Anxiety   Had surgery about a month ago. Has been starting to feel better.   ANXIETY/STRESS Duration: chronic Status:stable Anxious mood: yes  Excessive worrying: yes Irritability: no  Sweating: no Nausea: no Palpitations:no Hyperventilation: no Panic attacks: no Agoraphobia: no  Obscessions/compulsions: no Depressed mood: no    11/08/2021    2:53 PM 08/08/2021    2:01 PM 01/19/2021    9:21 AM 01/06/2020    2:55 PM 10/05/2019   11:05 AM  Depression screen PHQ 2/9  Decreased Interest 1 0 0 2 2  Down, Depressed, Hopeless 2 0 0 2 2  PHQ - 2 Score 3 0 0 4 4  Altered sleeping 1 1 0 0 2  Tired, decreased energy 2 1 0 0 2  Change in appetite 2 1 1  0 2  Feeling bad or failure about yourself  0 1 0 0 0  Trouble concentrating 1 1 0 0 2  Moving slowly or fidgety/restless 0 0 0 0 2  Suicidal thoughts 0 0 0 0 0  PHQ-9 Score 9 5 1 4 14   Difficult doing work/chores   Not difficult at all Not difficult at all Somewhat difficult      11/08/2021    2:53 PM 08/08/2021    2:01 PM 04/21/2021    8:57 AM 01/19/2021    9:21 AM  GAD 7 : Generalized Anxiety Score  Nervous, Anxious, on Edge 2 1 1 1   Control/stop worrying 2 1 0 1  Worry too much - different things 2 1 1 1   Trouble relaxing 2 0 0 1  Restless 2 1 0 1  Easily annoyed or irritable 1 1 0 0  Afraid - awful might happen 0 1 1 0  Total GAD 7 Score 11 6 3 5   Anxiety Difficulty  Not difficult at all Not difficult at all    Anhedonia: no Weight changes: no Insomnia: no   Hypersomnia: no Fatigue/loss of energy: no Feelings of worthlessness: no Feelings of guilt: no Impaired concentration/indecisiveness: no Suicidal ideations: no   Crying spells: no Recent Stressors/Life Changes: no   Relationship problems: no   Family stress: no     Financial stress: no    Job stress: no    Recent death/loss: no   Relevant past medical, surgical, family and social history reviewed and updated as indicated. Interim medical history since our last visit reviewed. Allergies and medications reviewed and updated.  Review of Systems  Constitutional: Negative.   Respiratory: Negative.    Cardiovascular: Negative.   Gastrointestinal: Negative.   Musculoskeletal: Negative.   Neurological: Negative.   Psychiatric/Behavioral: Negative.      Per HPI unless specifically indicated above     Objective:    BP 109/69   Pulse (!) 108   Temp 98.2 F (36.8 C)   Wt 144 lb (65.3 kg)   SpO2 96%   BMI 25.51 kg/m   Wt Readings from Last 3 Encounters:  11/08/21 144 lb (65.3 kg)  10/05/21 150 lb (68 kg)  08/31/21 152 lb (68.9 kg)  Physical Exam Vitals and nursing note reviewed.  Constitutional:      General: She is not in acute distress.    Appearance: Normal appearance. She is obese. She is not ill-appearing, toxic-appearing or diaphoretic.  HENT:     Head: Normocephalic and atraumatic.     Right Ear: External ear normal.     Left Ear: External ear normal.     Nose: Nose normal.     Mouth/Throat:     Mouth: Mucous membranes are moist.     Pharynx: Oropharynx is clear.  Eyes:     General: No scleral icterus.       Right eye: No discharge.        Left eye: No discharge.     Extraocular Movements: Extraocular movements intact.     Conjunctiva/sclera: Conjunctivae normal.     Pupils: Pupils are equal, round, and reactive to light.  Cardiovascular:     Rate and Rhythm: Normal rate and regular rhythm.     Pulses: Normal pulses.     Heart sounds: Normal heart sounds. No murmur heard.    No friction rub. No gallop.  Pulmonary:     Effort: Pulmonary effort is normal. No respiratory distress.     Breath sounds: Normal breath  sounds. No stridor. No wheezing, rhonchi or rales.  Chest:     Chest wall: No tenderness.  Musculoskeletal:        General: Normal range of motion.     Cervical back: Normal range of motion and neck supple.  Skin:    General: Skin is warm and dry.     Capillary Refill: Capillary refill takes less than 2 seconds.     Coloration: Skin is not jaundiced or pale.     Findings: No bruising, erythema, lesion or rash.  Neurological:     General: No focal deficit present.     Mental Status: She is alert and oriented to person, place, and time. Mental status is at baseline.  Psychiatric:        Mood and Affect: Mood normal.        Behavior: Behavior normal.        Thought Content: Thought content normal.        Judgment: Judgment normal.     Results for orders placed or performed during the hospital encounter of 10/05/21  ABO/Rh  Result Value Ref Range   ABO/RH(D)      A POS Performed at [facility], [address] [place], [place] [address]   Cytology - Non PAP;  Result Value Ref Range   CYTOLOGY - NON GYN      CYTOLOGY - NON PAP CASE: [id number] PATIENT: Maureen [name] Non-Gynecological Cytology Report     Specimen Submitted: A. Pelvic washings  Clinical History: Pelvic masses      DIAGNOSIS: A.  PELVIC WASHINGS; CYTOLOGY: - NO EVIDENCE OF SIGNIFICANT ATYPIA OR MALIGNANCY. - BENIGN MESOTHELIAL CELLS AND MILD MIXED INFLAMMATION.   GROSS DESCRIPTION: A. Labeled: Pelvic washings Received: Fresh Collection time: 8:26 AM on 10/05/2021 Placed into formalin time: Not applicable Volume: Approximately 40 mL Description of fluid and container in which it is received: Received in a clear plastic specimen container with a white screw top lid is pale yellow transparent fluid. Cytospin slide(s) received: 2  Specimen material submitted for: Cell block and ThinPrep  The cell block material is fixed in formalin for 6 hours prior to processing.  RB  10/05/2021  Final Diagnosis performed by Theodora Blow,  MD.   Electronically signed 10/06/2021 1:18:52PM The electronic sig nature indicates that the named Attending Pathologist has evaluated the specimen Technical component performed at [facility], [address] [place], [place] [address] Lab: [phone] Dir: Rush Farmer, MD, MMM  Professional component performed at [facility], [facility], [place], [place],  [address] Lab: [phone] Dir: Kathi Simpers, MD   Surgical pathology  Result Value Ref Range   SURGICAL PATHOLOGY      SURGICAL PATHOLOGY CASE: [id number] PATIENT: Maureen [name] Surgical Pathology Report     Specimen Submitted: A. Ovary, tube, left B. Ovary, tube, right  Clinical History: Pelvic masses      DIAGNOSIS: A.  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 - OVARIAN SURFACE ADHESIONS. - FALLOPIAN TUBE WITH NO SPECIFIC HISTOLOGIC ABNORMALITY.  B.  RIGHT OVARY AND FALLOPIAN TUBE; RIGHT SALPINGO-OOPHORECTOMY: - APPROXIMATELY 5 CM BENIGN FIBROMA. - OVARIAN SURFACE ADHESIONS. - FALLOPIAN TUBE WITH NO SPECIFIC HISTOLOGIC ABNORMALITY.   GROSS DESCRIPTION: Intraoperative Consultation:     Labeled: Left tube and ovary     Received: Fresh     Specimen: Salpingo-oophorectomy     Pathologic evaluation performed: Frozen section diagnosis     Diagnosis: FS: Bland fibrous appearing tissue, preliminarily favor fibroma     Communicated to: Called to Dr. Fransisca Connors at 9:36 AM on 10/05/2021 Theodora Blow M.D.     Tissue submitted:  A representative section of the ovary is frozen as FSA1 with 2 frozen section slides performed.  A. Labeled: Left tube and ovary Received: Fresh Collection time: 9:05 AM on 10/05/2021 Placed into formalin time: 9:36 AM on 10/05/2021 Type of procedure: Salpingo-oophorectomy Integrity: Intact Weight of specimen: 189.72 grams Size of specimen:           Ovary:  8.2 x 7 x 6.8 cm           Fallopian tube: 8.9 cm long by 0.3 cm in diameter Ovarian external surface: The serosa is tan-pink and diffusely lobulated.  The serosa is inked black. Ovarian internal surface: The cut surface is diffusely tan-white and fibrotic.  No distinct ovarian parenchyma is grossly identified. Fallopian tube lumen: Received adherent to the ovary is a fimbriated fallopian tube with a tan-pink, smooth, and glistening serosa.  There are multiple paratubal cysts up to 1.4 cm in greatest dimension.  The lumen is pinpoint and patent.  Block summary: 1 - FSA1 frozen section remnant, representative sec tion of ovary 2 - 4 - representative sections of ovary (1/cm) 5 - fallopian tube fimbria, longitudinally bisected and submitted entirely 6 - representative fallopian tube cross-sections  B. Labeled: Right tube and ovary Received: Fresh Collection time: 9:16 AM on 10/05/2021 Placed into formalin time: 10:06 AM on 10/05/2021 Type of procedure: Salpingo-oophorectomy Integrity: Intact Weight of specimen: 84.38 grams Size of specimen:           Ovary: 6 x 5.7 x 5.3 cm           Fallopian tube: 4.9 cm long by 0.8 cm in diameter Ovarian external surface: The serosa is tan-pink, smooth, and glistening with 2 unilocular cysts ranging from 0.6 to 0.8 cm in greatest dimension.  The serosa is entirely inked blue. Ovarian internal surface: The cut surface is diffusely tan-white and fibrotic.  There is a potential peripheral area of tan ovarian parenchyma. Fallopian tube lumen: The fallopian tube is received separated and has a tan-pink, smooth, glistening serosa.  The lumen is  pinpoint and patent.  Block summary: 1 - 3 - representative sections of ovary (1/cm) 4 - representative potential ovarian parenchyma 5 - fallopian tube fimbria, longitudinally bisected and submitted entirely 6 - representative fallopian tube cross-sections  RB 10/05/2021   Final Diagnosis performed by  Theodora Blow, MD.   Electronically signed 10/06/2021 9:28:41AM The electronic signature indicates that the named Attending Pathologist has evaluated the specimen Technical component performed at [facility], [address] [place], [place] [address] Lab: [phone] Dir: Rush Farmer, MD, MMM  Professional component performed at [facility], [facility], [place], [place], [place] [address] Lab: [phone] Dir: Kathi Simpers, MD       Assessment & Plan:   Problem List Items Addressed This Visit       Other   Generalized anxiety disorder    Under good control on current regimen. Continue current regimen. Continue to monitor. Call with any concerns. Refills given for 3 months. Follow up 3 months.        Relevant Medications   ALPRAZolam (XANAX) 0.5 MG tablet     Follow up plan: Return in about 3 months (around 02/08/2022) for physical.

## 2021-11-08 NOTE — Assessment & Plan Note (Signed)
Under good control on current regimen. Continue current regimen. Continue to monitor. Call with any concerns. Refills given for 3 months. Follow up 3 months.    

## 2021-11-09 ENCOUNTER — Inpatient Hospital Stay: Payer: Medicare HMO | Attending: Obstetrics and Gynecology | Admitting: Nurse Practitioner

## 2021-11-09 VITALS — BP 131/66 | HR 93 | Temp 98.7°F | Resp 20 | Wt 144.4 lb

## 2021-11-09 DIAGNOSIS — D270 Benign neoplasm of right ovary: Secondary | ICD-10-CM | POA: Insufficient documentation

## 2021-11-09 DIAGNOSIS — D271 Benign neoplasm of left ovary: Secondary | ICD-10-CM

## 2021-11-09 NOTE — Progress Notes (Signed)
Gynecologic Oncology history and physical  Referring Provider: Dr Prentice Docker  Chief Concern: ovarian masses  Subjective:  Maureen [name] is a 76 y.o. female initially seen in consultation from Dr. Glennon Mac for solid ovarian masses, now s/p Laparoscopic BSO, removal of pelvic masses, and mini lap with Dr. Glennon Mac and Dr. Fransisca Connors at [facility] on 10/05/21.   DIAGNOSIS:  A.  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  - OVARIAN SURFACE ADHESIONS.  - FALLOPIAN TUBE WITH NO SPECIFIC HISTOLOGIC ABNORMALITY.   B.  RIGHT OVARY AND FALLOPIAN TUBE; RIGHT SALPINGO-OOPHORECTOMY:  - APPROXIMATELY 5 CM BENIGN FIBROMA.  - OVARIAN SURFACE ADHESIONS.  - FALLOPIAN TUBE WITH NO SPECIFIC HISTOLOGIC ABNORMALITY.   DIAGNOSIS:  A.  PELVIC WASHINGS; CYTOLOGY:  - NO EVIDENCE OF SIGNIFICANT ATYPIA OR MALIGNANCY.  - BENIGN MESOTHELIAL CELLS AND MILD MIXED INFLAMMATION.  Post-op she noticed redness, yellow discharge, and pus on surgical site incision of pannus. She was seen by pcp and felt to have fungal skin infection. She was provided ketoconazole cream and clindamycin for possible secondary skin infection. Symptoms have now resolved. She continues to have some tenderness and fatigue post surgery but recovering.    Gynecology History:  Patient is a 76 year old smoker with history of bilateral breast cancer s/p mastectomy, emphysema COPD abdominal aortic aneurysm.  Patient also has discoid lupus.  She has breast status post bilateral mastectomies also had chemotherapy for her breast cancer.  She was picked up a screening CT scan to have a new solid nodule in the right upper lobe measuring 9.8 mm. Saw Dr Baruch Gouty 3/23 who discussed with Pulmonology.  She is not a suitable candidate for biopsy either with CT-guided fine-needle or navigational bronc.  He believed the best strategy would be to wait another 2 months and perform a PET CT scan.  Should there be hypermetabolic  activity would offer SBRT 60 Gray in 5 fractions.    PET/CT IMPRESSION: 1. Bilateral pulmonary nodules are again noted which are all technically too small to reliably characterize by PET-CT. 2. The previously characterized new solid nodule in the left upper lobe measuring 5.8 mm has an SUV max of 1.97. Differential considerations for this degree of uptake include inflammatory/infectious change versus early malignancy. 3. The previously characterized new part solid nodule within the right upper lobe is difficult to visualize on today's study and may have resolved in the interval. 4. Several new small nodules are identified within the right upper lobe, right lower lobe and left upper lobe which are nonspecific and exhibit mild SUV max (less than 1.0). 5. Recommend repeat Lung cancer screening CT in 3 months to assess for temporal change in the appearance of these lung nodules which are likely too small to successfully biopsy. 6. No signs of FDG avid nodal metastasis or distant metastatic disease. 7. Increased uptake within the distal esophagus is noted, nonspecific and may be related to esophagitis. 8. Infrarenal abdominal aortic aneurysm measures 4.6 cm. Recommend follow-up CT/MR every 6 months and vascular consultation. This recommendation follows ACR consensus guidelines: White Paper of the ACR Incidental Findings Committee II on Vascular Findings. J Am Coll Radiol 2013; 10:789-794. 9. Aortic Atherosclerosis (ICD10-I70.0) and Emphysema (ICD10-J43.9). Coronary artery calcifications.  Within the right adnexa there is a solid-appearing mass measuring 5.4 x 5.2 cm with SUV max 2.44. Within the left adnexa there is a solid-appearing mass measuring 6.5 x 6.9 cm with SUV max of 2.6, image 172/4. The patient is status post  hysterectomy  Addendum There are bilateral indeterminate solid-appearing adnexal masses. These are indeterminate. There is no mention of this finding on remote pelvic ultrasound from  11/17/2008. Initial evaluation with pelvic sonogram is recommended. Alternatively contrast enhanced pelvic MRI may provide a more definitive characterization.   She saw Dr Glennon Mac for consult. Pelvic US 08/31/21  Indications: Pelvic masses Findings:  The uterus is absent. Right Ovary measures not definitively visualized. Left Ovary measures not definitively visualized. Two masses noted in the pelvic region. Both have mixed echogenicity and both appear to be solid.  There are areas of increased density with possible blood flow in some areas on doppler interrogation: Left: 3.58 x 4.39 x 3.23 cm Right: 4.51 x 3.50 x 3.57 cm No free fluid in the cul de sac.  She is asymptomatic with no pelvic pain or bleeding.  Some urinary pressure.  Vaginal hysterectomy in later 20s due to abnormal PAPs  We discussed options for management including surgery and she is scheduled for laparoscopic BSO, possible pelvic/aortic LN biopsies, possible laparotomy 10/05/21.   CEA 4.2 CA125 30.6 Inhibin <7    Patient Active Problem List   Diagnosis Date Noted   Bilateral tubo-ovarian mass 08/31/2021   Adnexal mass 08/29/2021   Atherosclerosis of aortic bifurcation and common iliac arteries ([place]) 10/22/2020   Abdominal aortic aneurysm (AAA) without rupture ([place]) 11/11/2018   History of breast cancer 11/11/2018   COPD (chronic obstructive pulmonary disease) with chronic bronchitis ([place]) 06/19/2018   Skin lesion of chest wall 06/15/2016   Vitamin D deficiency 05/29/2016   Tobacco abuse 05/25/2016   Controlled substance agreement signed 05/25/2016   Advance care planning 05/25/2016   History of colonic polyps 06/12/2014   Esophageal reflux 06/12/2014   Hyperlipidemia 05/11/2014   Lupus ([place]) 04/16/2013   Generalized anxiety disorder 04/16/2013   Personal history of other malignant neoplasm of skin 03/29/2012   Past Medical History:  Diagnosis Date   Abdominal aortic aneurysm (AAA) without rupture ([place])     followed by Dr Delana Meyer   Abnormal EKG    pt states ekg always shows she is having a heart attack   Arthritis 2005   Breast cancer, left breast ([place]) 2011   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 This was a histologic grade 2 tumor measuring just under 1 cm in diameter. No invasive cancer was identified. ER 90%, PR 40%.   Breast cancer, right breast ([place]) 1994   Invasive ductal carcinoma.The patient underwent right mastectomy in 1994 for invasive ductal carcinoma.   Bronchitis    COPD (chronic obstructive pulmonary disease) ([place])    Discoid lupus 2005   Discoid lupus 2005   Emphysematous bleb ([facility])    GERD (gastroesophageal reflux disease)    Heart murmur 2005   Hypercholesterolemia 2012   Mammographic microcalcification 2011   Panic attack    Personal history of chemotherapy 1994   Personal history of colonic polyps    Colon polyps   Personal history of other malignant neoplasm of skin 2015   basal cell   Personal history of tobacco use, presenting hazards to health    PUD (peptic ulcer disease)    Last EGD 1/09   Pulmonary nodule    Rectal polyp 2008   villous adenoma; last colonoscopy 1/09 WNL   Skin cancer, basal cell 2015   right cheek Mohs surgery at [place] use    Ulcer    Past Surgical History:  Procedure Laterality Date  APPENDECTOMY  2011   BREAST BIOPSY  12/27/2009   right breast   CARDIAC CATHETERIZATION  1996   Negative Cath   COLONOSCOPY N/A 08/05/2014   Procedure: COLONOSCOPY;  Surgeon: [name], MD;  Location: [facility] ENDOSCOPY;  EGD, tubular adenoma 1.   COLONOSCOPY W/ BIOPSIES  2005,2009   Dr. Bary Castilla. Normal exam 2009. Villous adenoma of the rectum in 2005 adenomatous polyp from the ascending colon and rectum in 2002.   ESOPHAGOGASTRODUODENOSCOPY N/A 08/05/2014   Procedure: ESOPHAGOGASTRODUODENOSCOPY (EGD);  Surgeon: [name], MD;  Location: [facility] ENDOSCOPY;  Service: Endoscopy;  Laterality:  N/A;   ESOPHAGOGASTRODUODENOSCOPY (EGD) WITH PROPOFOL N/A 07/25/2017   Procedure: ESOPHAGOGASTRODUODENOSCOPY (EGD) WITH PROPOFOL;  Surgeon: [name], MD;  Location: [facility] ENDOSCOPY;  Service: Endoscopy;  Laterality: N/A;   ESOPHAGOGASTRODUODENOSCOPY (EGD) WITH PROPOFOL N/A 08/03/2021   Procedure: ESOPHAGOGASTRODUODENOSCOPY (EGD) WITH PROPOFOL;  Surgeon: [name], MD;  Location: [facility] ENDOSCOPY;  Service: Endoscopy;  Laterality: N/A;   LAPAROSCOPIC BILATERAL SALPINGO OOPHERECTOMY Bilateral 10/05/2021   Procedure: LAPAROSCOPIC BILATERAL SALPINGO OOPHORECTOMY, REMOVAL OF PELVIC MASSES, MINI LAPAROTOMY;  Surgeon: Mellody Drown, MD;  Location: [facility] ORS;  Service: Gynecology;  Laterality: Bilateral;   MASTECTOMY     bilateral   SIMPLE MASTECTOMY  1993   right   SIMPLE MASTECTOMY  2011   left   TONSILLECTOMY  1997   UPPER GI ENDOSCOPY  2003, 2005, 2009, 2012, 2014   2012 showed mild reflux changes without evidence of Barrett's epithelial changes 2014 biopsies at 37 cm showed changes suggestive of reflux esophagitis. No metaplasia.   VAGINAL HYSTERECTOMY  1977   Past Gynecologic History:  Post menopausal.   OB History:  OB History  Gravida Para Term Preterm AB Living  2 2 2     2   SAB IAB Ectopic Multiple Live Births               # Outcome Date GA Lbr Len/2nd Weight Sex Delivery Anes PTL Lv  2 Term           1 Term             Obstetric Comments  Age with first menstruation-15  Age with first pregnancy-24  LMP-1977, hysterectomy   Family History  Problem Relation Age of Onset   Cancer Father        esophagus   Cancer Maternal Aunt        breast cancer   Cancer Maternal Grandmother        breast cancer   Social History   Socioeconomic History   Marital status: Widowed    Spouse name: Not on file   Number of children: Not on file   Years of education: Not on file   Highest education level: Not on file  Occupational History   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 She has two adult daughters. She lives in her home. Her youngest daughter and grandchildren live with her. She has two dogs. 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 She loves watching her grandson play basketball. She also loves gardening and being outdoors and working in the yard.    Social Determinants of Health   Financial Resource Strain: Not on file  Food Insecurity: Not on file  Transportation Needs: Not on file  Physical Activity: Not on file  Stress: Not on file  Social Connections: Not on file   Allergies  Allergen Reactions   Doxycycline Rash   Codeine Itching   Incruse Ellipta [Umeclidinium Bromide] Other (See Comments)    Oral sores   Sulfa Antibiotics Hives   Sulfinpyrazone    Erythromycin Rash   Penicillins Rash   Current Outpatient Medications on File Prior to Visit  Medication Sig Dispense Refill   albuterol (PROVENTIL) (2.5 MG/3ML) 0.083% nebulizer solution Take 3 mLs (2.5 mg total) by nebulization every 6 (six) hours as needed for wheezing or shortness of breath. 150 mL 1   albuterol (VENTOLIN HFA) 108 (90 Base) MCG/ACT inhaler Inhale 1-2 puffs into the lungs every 6 (six) hours as needed for wheezing or shortness of breath. 18 each 6   ALPRAZolam (XANAX) 0.5 MG tablet Take 1 tablet (0.5 mg total) by mouth 2  (two) times daily as needed for anxiety. 45 tablet 1   Ascorbic Acid (VITAMIN C) 100 MG tablet Take 100 mg by mouth daily.     budesonide-formoterol (SYMBICORT) 160-4.5 MCG/ACT inhaler Inhale 2 puffs into the lungs 2 (two) times daily. 3 each 3   Calcium-Vitamins C & D (CALCIUM/C/D PO) Take 1 tablet by mouth daily.     cetirizine (ZYRTEC ALLERGY) 10 MG tablet Take 10 mg by mouth every morning.     Cholecalciferol (VITAMIN D3) 50 MCG (2000 UT) TABS Take 1 tablet by mouth daily at 6 (six) AM.     clindamycin (CLEOCIN) 300 MG capsule Take 300 mg by mouth 3 (three) times daily.     fluticasone (FLONASE) 50 MCG/ACT nasal spray Place 2 sprays into the nose every morning.     ketoconazole (NIZORAL) 2 % cream Apply 1 Application topically daily.     KRILL OIL PO Take by mouth daily.     montelukast (SINGULAIR) 10 MG tablet TAKE 1 TABLET BY MOUTH EVERYDAY AT BEDTIME 90 tablet 1   omeprazole (PRILOSEC) 20 MG capsule Take 1 capsule (20 mg total) by mouth every morning.     vitamin E 100 UNIT capsule Take 100 Units by mouth daily.     zinc gluconate 50 MG tablet Take 50 mg by mouth as needed.     No current facility-administered medications on file prior to visit.   Review of Systems General:  fatigue Skin: post-op yeast & bacterial infection of pannus at wound site. Eyes: no complaints HEENT: no complaints Breasts: no complaints Pulmonary: no complaints Cardiac: no complaints Gastrointestinal: no complaints Genitourinary/Sexual: no complaints Ob/Gyn: no complaints Musculoskeletal: no complaints Hematology: no complaints Neurologic/Psych: no complaints   Objective:  Physical Examination:  Today's Vitals   11/09/21 0920  BP: 131/66  Pulse: 93  Resp: 20  Temp: 98.7 F (37.1 C)  SpO2: 99%  Weight: 144 lb 6.4 oz (65.5 kg)   Body mass index is  25.58 kg/m.    ECOG Performance Status: 1 - Symptomatic but completely ambulatory  GENERAL: Patient is a well appearing female in no acute  distress HEENT:  Sclera clear. Anicteric NODES:  Negative axillary, supraclavicular, inguinal lymph node survery LUNGS:  Clear to auscultation bilaterally.   HEART:  Regular rate and rhythm.  ABDOMEN:  Soft, nontender.  No hernias. Incisions are healing well. Pannus incision is healing, ointment in place. No masses or ascites EXTREMITIES:  No peripheral edema. Atraumatic. No cyanosis SKIN:  Clear with no obvious rashes or skin changes.  NEURO:  Nonfocal. Well oriented.  Appropriate affect.  Pelvic: exam chaperoned by nurse;  Vulva: No masses, tenderness or lesions; Vagina: Vaginal cuff has healed well. Adnexa, Rectal: deferred.   Lab Review Per hpi   Assessment:  Maureen [name] is a 76 y.o. female diagnosed with bilateral 6-7 cm solid ovarian masses on PET/CT done for evaluation of lung nodule.  Indeterminate on PET based on low SUV. Now post lap BSO, minilap on 10/05/21 who returns to clinic for post-op. Pathology was reviewed including diagnosis of bilateral ovarian fibromas. Washings were also negative.   Smoker with indeterminate lung nodules.  Considering SBRT for possible lung cancer, but plan for now is to continue close follow up.  She has COPD, but seems fairly functional and is able to climb stairs.   Medical co-morbidities complicating care: COPD, breast cancer, lupus.  Plan:   Problem List Items Addressed This Visit       Other   Bilateral tubo-ovarian mass   Reviewed the benign pathology and that no additional treatment is recommended. I reviewed this with Dr. Fransisca Connors who agrees. She continues to heal well post operatively. Vaginal cuff well healed. She can continue follow up with Dr. Glennon Mac and return to the cancer center as needed.   Candida of skin fold- encouraged her to keep skin clean and dry. Losing weight may help reduce recurrent symptoms. Can continue antifungal ointment as prescribed.   Beckey Rutter, DNP, AGNP-C [place] at [facility] [phone] (clinic)  CC:  Dr. Glennon Mac

## 2021-11-10 ENCOUNTER — Other Ambulatory Visit (INDEPENDENT_AMBULATORY_CARE_PROVIDER_SITE_OTHER): Payer: Self-pay | Admitting: Vascular Surgery

## 2021-11-10 DIAGNOSIS — I700 Atherosclerosis of aorta: Secondary | ICD-10-CM

## 2021-11-10 DIAGNOSIS — I714 Abdominal aortic aneurysm, without rupture, unspecified: Secondary | ICD-10-CM

## 2021-11-14 ENCOUNTER — Ambulatory Visit (INDEPENDENT_AMBULATORY_CARE_PROVIDER_SITE_OTHER): Payer: Medicare HMO

## 2021-11-14 ENCOUNTER — Encounter (INDEPENDENT_AMBULATORY_CARE_PROVIDER_SITE_OTHER): Payer: Self-pay | Admitting: Vascular Surgery

## 2021-11-14 ENCOUNTER — Ambulatory Visit (INDEPENDENT_AMBULATORY_CARE_PROVIDER_SITE_OTHER): Payer: Medicare HMO | Admitting: Vascular Surgery

## 2021-11-14 VITALS — BP 104/56 | HR 87 | Resp 16 | Wt 143.2 lb

## 2021-11-14 DIAGNOSIS — I708 Atherosclerosis of other arteries: Secondary | ICD-10-CM

## 2021-11-14 DIAGNOSIS — E782 Mixed hyperlipidemia: Secondary | ICD-10-CM

## 2021-11-14 DIAGNOSIS — J449 Chronic obstructive pulmonary disease, unspecified: Secondary | ICD-10-CM

## 2021-11-14 DIAGNOSIS — I700 Atherosclerosis of aorta: Secondary | ICD-10-CM | POA: Diagnosis not present

## 2021-11-14 DIAGNOSIS — I70212 Atherosclerosis of native arteries of extremities with intermittent claudication, left leg: Secondary | ICD-10-CM

## 2021-11-14 DIAGNOSIS — I714 Abdominal aortic aneurysm, without rupture, unspecified: Secondary | ICD-10-CM

## 2021-11-14 NOTE — Progress Notes (Signed)
MRN : [id number]  Maureen [name] is a 76 y.o. (15-Jun-1945) female who presents with chief complaint of check circulation.  History of Present Illness:  The patient presents to the office for evaluation of an abdominal aortic aneurysm. The aneurysm was found incidentally by CT scan of the chest that was ordered to evaluate a lung nodule.  However, since this was a study focusing on the chest the abdominal aortic aneurysm was incompletely characterized.  Subsequently duplex ultrasound of the abdominal aorta was obtained to more completely characterize the aneurysm.  The infrarenal aortic aneurysm measures 4.1 cm patient denies abdominal pain or unusual back pain, no other abdominal complaints.  No history of an acute onset of painful blue discoloration of the toes.      No family history of AAA.    There is a history of claudication but no rest pain symptoms of the lower extremities.  No prior interventions.  No wounds or sores   Patient denies amaurosis fugax or TIA symptoms.    The patient denies angina or shortness of breath.   Aortic duplex shows AAA 4.35 cm  (previous aortic duplex shows AAA 4.33 cm)  ABI's Rt=1.15 and Lt=0.93  (previous ABI's Rt=1.18 and Lt=0.87)   Current Meds  Medication Sig   albuterol (PROVENTIL) (2.5 MG/3ML) 0.083% nebulizer solution Take 3 mLs (2.5 mg total) by nebulization every 6 (six) hours as needed for wheezing or shortness of breath.   albuterol (VENTOLIN HFA) 108 (90 Base) MCG/ACT inhaler Inhale 1-2 puffs into the lungs every 6 (six) hours as needed for wheezing or shortness of breath.   ALPRAZolam (XANAX) 0.5 MG tablet Take 1 tablet (0.5 mg total) by mouth 2 (two) times daily as needed for anxiety.   Ascorbic Acid (VITAMIN C) 100 MG tablet Take 100 mg by mouth daily.   budesonide-formoterol (SYMBICORT) 160-4.5 MCG/ACT inhaler Inhale 2 puffs into the lungs 2 (two) times daily.   Calcium-Vitamins C & D (CALCIUM/C/D PO) Take 1 tablet  by mouth daily.   cetirizine (ZYRTEC ALLERGY) 10 MG tablet Take 10 mg by mouth every morning.   Cholecalciferol (VITAMIN D3) 50 MCG (2000 UT) TABS Take 1 tablet by mouth daily at 6 (six) AM.   clindamycin (CLEOCIN) 300 MG capsule Take 300 mg by mouth 3 (three) times daily.   fluticasone (FLONASE) 50 MCG/ACT nasal spray Place 2 sprays into the nose every morning.   ketoconazole (NIZORAL) 2 % cream Apply 1 Application topically daily.   KRILL OIL PO Take by mouth daily.   montelukast (SINGULAIR) 10 MG tablet TAKE 1 TABLET BY MOUTH EVERYDAY AT BEDTIME   omeprazole (PRILOSEC) 20 MG capsule Take 1 capsule (20 mg total) by mouth every morning.   vitamin E 100 UNIT capsule Take 100 Units by mouth daily.   zinc gluconate 50 MG tablet Take 50 mg by mouth as needed.    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 This was a histologic grade 2 tumor measuring just under 1 cm in diameter. No invasive cancer was identified. ER 90%, PR 40%.   Breast  cancer, right breast ([place]) 1994   Invasive ductal carcinoma.The patient underwent right mastectomy in 1994 for invasive ductal carcinoma.   Bronchitis    COPD (chronic obstructive pulmonary disease) ([place])    Discoid lupus 2005   Discoid lupus 2005   Emphysematous bleb (HCC)    GERD (gastroesophageal reflux disease)    Heart murmur 2005   Hypercholesterolemia 2012   Mammographic microcalcification 2011   Panic attack    Personal history of chemotherapy 1994   Personal history of colonic polyps    Colon polyps   Personal history of other malignant neoplasm of skin 2015   basal cell   Personal history of tobacco use, presenting hazards to health    PUD (peptic ulcer disease)     Last EGD 1/09   Pulmonary nodule    Rectal polyp 2008   villous adenoma; last colonoscopy 1/09 WNL   Skin cancer, basal cell 2015   right cheek Mohs surgery at [place] use    Ulcer     Past Surgical History:  Procedure Laterality Date   APPENDECTOMY  2011   BREAST BIOPSY  12/27/2009   right breast   CARDIAC CATHETERIZATION  1996   Negative Cath   COLONOSCOPY N/A 08/05/2014   Procedure: COLONOSCOPY;  Surgeon: [name], MD;  Location: [facility] ENDOSCOPY;  EGD, tubular adenoma 1.   COLONOSCOPY W/ BIOPSIES  2005,2009   Dr. Bary Castilla. Normal exam 2009. Villous adenoma of the rectum in 2005 adenomatous polyp from the ascending colon and rectum in 2002.   ESOPHAGOGASTRODUODENOSCOPY N/A 08/05/2014   Procedure: ESOPHAGOGASTRODUODENOSCOPY (EGD);  Surgeon: [name], MD;  Location: [facility] ENDOSCOPY;  Service: Endoscopy;  Laterality: N/A;   ESOPHAGOGASTRODUODENOSCOPY (EGD) WITH PROPOFOL N/A 07/25/2017   Procedure: ESOPHAGOGASTRODUODENOSCOPY (EGD) WITH PROPOFOL;  Surgeon: [name], MD;  Location: [facility] ENDOSCOPY;  Service: Endoscopy;  Laterality: N/A;   ESOPHAGOGASTRODUODENOSCOPY (EGD) WITH PROPOFOL N/A 08/03/2021   Procedure: ESOPHAGOGASTRODUODENOSCOPY (EGD) WITH PROPOFOL;  Surgeon: [name], MD;  Location: [facility] ENDOSCOPY;  Service: Endoscopy;  Laterality: N/A;   LAPAROSCOPIC BILATERAL SALPINGO OOPHERECTOMY Bilateral 10/05/2021   Procedure: LAPAROSCOPIC BILATERAL SALPINGO OOPHORECTOMY, REMOVAL OF PELVIC MASSES, MINI LAPAROTOMY;  Surgeon: Mellody Drown, MD;  Location: [facility] ORS;  Service: Gynecology;  Laterality: Bilateral;   MASTECTOMY     bilateral   SIMPLE MASTECTOMY  1993   right   SIMPLE MASTECTOMY  2011   left   TONSILLECTOMY  1997   UPPER GI ENDOSCOPY  2003, 2005, 2009, 2012, 2014   2012 showed mild reflux changes without evidence of Barrett's epithelial changes 2014 biopsies at 37 cm showed changes suggestive of reflux esophagitis. No  metaplasia.   VAGINAL HYSTERECTOMY  1977    Social History Social History   Tobacco Use   Smoking status: Every Day    Packs/day: 0.50    Years: 40.00    Total pack years: 20.00    Types: Cigarettes   Smokeless tobacco: Never   Tobacco comments:    Pt wants to do hypnotizing  Vaping Use   Vaping Use: Never used  Substance Use Topics   Alcohol use: Yes    Alcohol/week: 1.0 standard drink of alcohol    Types: 1 Cans of beer per week    Comment: 1 beer weekly   Drug use: No    Family History Family History  Problem Relation Age of Onset   Cancer Father        esophagus   Cancer Maternal  Aunt        breast cancer   Cancer Maternal Grandmother        breast cancer    Allergies  Allergen Reactions   Doxycycline Rash   Codeine Itching   Incruse Ellipta [Umeclidinium Bromide] Other (See Comments)    Oral sores   Sulfa Antibiotics Hives   Sulfinpyrazone    Erythromycin Rash   Penicillins Rash     REVIEW OF SYSTEMS (Negative unless checked)  Constitutional: [] Weight loss  [] Fever  [] Chills Cardiac: [] Chest pain   [] Chest pressure   [] Palpitations   [] Shortness of breath when laying flat   [] Shortness of breath with exertion. Vascular:  [x] Pain in legs with walking   [] Pain in legs at rest  [] History of DVT   [] Phlebitis   [] Swelling in legs   [] Varicose veins   [] Non-healing ulcers Pulmonary:   [] Uses home oxygen   [] Productive cough   [] Hemoptysis   [] Wheeze  [] COPD   [] Asthma Neurologic:  [] Dizziness   [] Seizures   [] History of stroke   [] History of TIA  [] Aphasia   [] Vissual changes   [] Weakness or numbness in arm   [] Weakness or numbness in leg Musculoskeletal:   [] Joint swelling   [] Joint pain   [] Low back pain Hematologic:  [] Easy bruising  [] Easy bleeding   [] Hypercoagulable state   [] Anemic Gastrointestinal:  [] Diarrhea   [] Vomiting  [x] Gastroesophageal reflux/heartburn   [] Difficulty swallowing. Genitourinary:  [] Chronic kidney disease   [] Difficult  urination  [] Frequent urination   [] Blood in urine Skin:  [] Rashes   [] Ulcers  Psychological:  [] History of anxiety   []  History of major depression.  Physical Examination  Vitals:   11/14/21 0954  BP: (!) 104/56  Pulse: 87  Resp: 16  Weight: 143 lb 3.2 oz (65 kg)   Body mass index is 25.37 kg/m. Gen: WD/WN, NAD Head: [place]/AT, No temporalis wasting.  Ear/Nose/Throat: Hearing grossly intact, nares w/o erythema or drainage Eyes: PER, EOMI, sclera nonicteric.  Neck: Supple, no masses.  No bruit or JVD.  Pulmonary:  Good air movement, no audible wheezing, no use of accessory muscles.  Cardiac: RRR, normal S1, S2, no Murmurs. Vascular:  mild trophic changes, no open wounds Vessel Right Left  Radial Palpable Palpable  PT Palpable Not Palpable  DP Palpable Not Palpable  Gastrointestinal: soft, non-distended. No guarding/no peritoneal signs.  Musculoskeletal: M/S 5/5 throughout.  No visible deformity.  Neurologic: CN 2-12 intact. Pain and light touch intact in extremities.  Symmetrical.  Speech is fluent. Motor exam as listed above. Psychiatric: Judgment intact, Mood & affect appropriate for pt's clinical situation. Dermatologic: No rashes or ulcers noted.  No changes consistent with cellulitis.   CBC Lab Results  Component Value Date   WBC 6.2 09/28/2021   HGB 13.2 09/28/2021   HCT 40.0 09/28/2021   MCV 93.5 09/28/2021   PLT 193 09/28/2021    BMET    Component Value Date/Time   NA 142 09/09/2021 0911   K 4.5 09/09/2021 0911   CL 107 (H) 09/09/2021 0911   CO2 20 09/09/2021 0911   GLUCOSE 91 09/09/2021 0911   GLUCOSE 95 12/10/2014 1616   BUN 13 09/09/2021 0911   CREATININE 1.15 (H) 09/09/2021 0911   CALCIUM 9.8 09/09/2021 0911   GFRNONAA 55 (L) 02/03/2020 0859   GFRAA 63 02/03/2020 0859   CrCl cannot be calculated (Patient's most recent lab result is older than the maximum 21 days allowed.).  COAG No results found for: "INR", "PROTIME"  Radiology No results  found.   Assessment/Plan 1. Abdominal aortic aneurysm (AAA) without rupture, unspecified part ([place]) Recommend: No surgery or intervention is indicated at this time.  The patient has an asymptomatic abdominal aortic aneurysm that is greater than 4 cm but less than 5 cm in maximal diameter.    I have reviewed the natural history of abdominal aortic aneurysm and the small risk of rupture for aneurysm less than 5 cm in size.  However, as these small aneurysms tend to enlarge over time, continued surveillance with ultrasound or CT scan is mandatory.   I have also discussed optimizing medical management with hypertension and lipid control and the importance of abstinence from tobacco.  The patient is also encouraged to exercise a minimum of 30 minutes 4 times a week.   Should the patient develop new onset abdominal or back pain or signs of peripheral embolization they are instructed to seek medical attention immediately and to alert the physician providing care that they have an aneurysm.   The patient voices their understanding.  I have scheduled the patient to return in 6 months with an aortic duplex.  - VAS US AORTA/IVC/ILIACS; Future  2. Atherosclerosis of native artery of left lower extremity with intermittent claudication (HCC)  Recommend:  The patient has evidence of atherosclerosis of the lower extremities with claudication.  The patient does not voice lifestyle limiting changes at this point in time.  Noninvasive studies do not suggest clinically significant change.  No invasive studies, angiography or surgery at this time The patient should continue walking and begin a more formal exercise program.  The patient should continue antiplatelet therapy and aggressive treatment of the lipid abnormalities  No changes in the patient's medications at this time  Continued surveillance is indicated as atherosclerosis is likely to progress with time.    The patient will continue follow up  with noninvasive studies as ordered.    3. COPD (chronic obstructive pulmonary disease) with chronic bronchitis (HCC) Continue pulmonary medications and aerosols as already ordered, these medications have been reviewed and there are no changes at this time.    4. Mixed hyperlipidemia Continue statin as ordered and reviewed, no changes at this time     Hortencia Pilar, MD  11/14/2021 1:08 PM

## 2021-11-17 ENCOUNTER — Other Ambulatory Visit: Payer: Self-pay | Admitting: *Deleted

## 2021-11-17 DIAGNOSIS — R911 Solitary pulmonary nodule: Secondary | ICD-10-CM

## 2021-11-24 ENCOUNTER — Inpatient Hospital Stay: Payer: Medicare HMO | Attending: Obstetrics and Gynecology

## 2021-11-24 DIAGNOSIS — D398 Neoplasm of uncertain behavior of other specified female genital organs: Secondary | ICD-10-CM | POA: Diagnosis present

## 2021-11-24 DIAGNOSIS — R911 Solitary pulmonary nodule: Secondary | ICD-10-CM

## 2021-11-24 LAB — CBC
HCT: 39.1 % (ref 36.0–46.0)
Hemoglobin: 13 g/dL (ref 12.0–15.0)
MCH: 30.4 pg (ref 26.0–34.0)
MCHC: 33.2 g/dL (ref 30.0–36.0)
MCV: 91.4 fL (ref 80.0–100.0)
Platelets: 221 10*3/uL (ref 150–400)
RBC: 4.28 MIL/uL (ref 3.87–5.11)
RDW: 13.8 % (ref 11.5–15.5)
WBC: 6.8 10*3/uL (ref 4.0–10.5)
nRBC: 0 % (ref 0.0–0.2)

## 2021-11-24 LAB — COMPREHENSIVE METABOLIC PANEL
ALT: 22 U/L (ref 0–44)
AST: 19 U/L (ref 15–41)
Albumin: 4.1 g/dL (ref 3.5–5.0)
Alkaline Phosphatase: 101 U/L (ref 38–126)
Anion gap: 7 (ref 5–15)
BUN: 11 mg/dL (ref 8–23)
CO2: 24 mmol/L (ref 22–32)
Calcium: 10.1 mg/dL (ref 8.9–10.3)
Chloride: 109 mmol/L (ref 98–111)
Creatinine, Ser: 1.06 mg/dL — ABNORMAL HIGH (ref 0.44–1.00)
GFR, Estimated: 55 mL/min — ABNORMAL LOW (ref >60)
Glucose, Bld: 101 mg/dL — ABNORMAL HIGH (ref 70–99)
Potassium: 4.1 mmol/L (ref 3.5–5.1)
Sodium: 140 mmol/L (ref 135–145)
Total Bilirubin: 0.5 mg/dL (ref 0.3–1.2)
Total Protein: 7.4 g/dL (ref 6.5–8.1)

## 2021-11-30 ENCOUNTER — Ambulatory Visit
Admission: RE | Admit: 2021-11-30 | Discharge: 2021-11-30 | Disposition: A | Discharge status: Home / Self Care | Payer: Medicare HMO | Source: Ambulatory Visit | Attending: Radiation Oncology | Admitting: Radiation Oncology

## 2021-11-30 DIAGNOSIS — R911 Solitary pulmonary nodule: Secondary | ICD-10-CM | POA: Insufficient documentation

## 2021-11-30 MED ORDER — IOHEXOL 300 MG/ML  SOLN
50.0000 mL | Freq: Once | INTRAMUSCULAR | Status: AC | PRN
  Administered 2021-11-30: 50 mL via INTRAVENOUS

## 2021-12-07 ENCOUNTER — Ambulatory Visit
Admission: RE | Admit: 2021-12-07 | Discharge: 2021-12-07 | Disposition: A | Discharge status: Home / Self Care | Payer: Medicare HMO | Source: Ambulatory Visit | Attending: Radiation Oncology | Admitting: Radiation Oncology

## 2021-12-07 VITALS — BP 121/75 | HR 93 | Temp 97.3°F | Resp 16 | Ht 63.5 in | Wt 141.1 lb

## 2021-12-07 DIAGNOSIS — Z923 Personal history of irradiation: Secondary | ICD-10-CM | POA: Diagnosis not present

## 2021-12-07 DIAGNOSIS — R918 Other nonspecific abnormal finding of lung field: Secondary | ICD-10-CM | POA: Diagnosis present

## 2021-12-07 DIAGNOSIS — R911 Solitary pulmonary nodule: Secondary | ICD-10-CM

## 2021-12-07 NOTE — Progress Notes (Signed)
Radiation Oncology Follow up Note  Name: Maureen [name]   Date:   12/07/2021 MRN:  [id number] DOB: [birth year]    This 76 y.o. female presents to the clinic today for for follow-up of serial imaging focusing on a possible's stage I non-small cell lung cancer of the right upper lobe.  REFERRING PROVIDER: Valerie Roys, DO  HPI: Patient is a 76 year old female we have been following with serial CT scans and PET CT scans for a 1 cm lesion in the right upper lobe..  She continues to be asymptomatic.  She had a PET scan back in May showing small bilateral pulmonary nodules technically too small to characterize by PET.  She also has a solid lesion in the left upper lobe measuring 6 mm which is not hypermetabolic.  The previously characterized solid nodule in the right upper lobe is difficult to event visualized on that PET scan in May.  Recent CT scan showed progression of numerous ill-defined subsolid and confluent bilateral pulm nodules with a distinct upper lung predominance.  Infectious inflammatory etiology should be the primary diagnosis according to radiology.  Right upper lobe nodule no longer seen 5 mm left upper pulmonary nodule measures now 8 mm.  COMPLICATIONS OF TREATMENT: none  FOLLOW UP COMPLIANCE: keeps appointments   PHYSICAL EXAM:  BP 121/75   Pulse 93   Temp (!) 97.3 F (36.3 C)   Resp 16   Ht 5' 3.5" (1.613 m)   Wt 141 lb 1.6 oz (64 kg)   BMI 24.60 kg/m  Well-developed well-nourished patient in NAD. HEENT reveals PERLA, EOMI, discs not visualized.  Oral cavity is clear. No oral mucosal lesions are identified. Neck is clear without evidence of cervical or supraclavicular adenopathy. Lungs are clear to A&P. Cardiac examination is essentially unremarkable with regular rate and rhythm without murmur rub or thrill. Abdomen is benign with no organomegaly or masses noted. Motor sensory and DTR levels are equal and symmetric in the upper and lower extremities. Cranial nerves II  through XII are grossly intact. Proprioception is intact. No peripheral adenopathy or edema is identified. No motor or sensory levels are noted. Crude visual fields are within normal range.  RADIOLOGY RESULTS: At this time I am to continue observe see nothing really that would be highly suspicious for progressive lung cancer.  We will see her back in 6 months with a follow-up CT scan at that time.  Patient meantime knows to call with any concerns.  I would like to take this opportunity to thank you for allowing me to participate in the care of your patient.Marland Kitchen  PLAN: As above    Noreene Filbert, MD

## 2021-12-26 ENCOUNTER — Other Ambulatory Visit: Payer: Medicare HMO

## 2022-01-16 ENCOUNTER — Encounter (INDEPENDENT_AMBULATORY_CARE_PROVIDER_SITE_OTHER): Payer: Self-pay

## 2022-01-19 ENCOUNTER — Telehealth: Payer: Self-pay | Admitting: Acute Care

## 2022-01-19 NOTE — Telephone Encounter (Signed)
Please see Dr. Olena Leatherwood response . He will follow the patient for the lung cancer screening and treatment as needed. Thanks so much   Noreene Filbert, MD  Magdalen Spatz, NP That is fine I will follow her and she can come out of the screening program.  Thanks GC       Previous Messages    ----- Message ----- From: Magdalen Spatz, NP Sent: 01/18/2022   3:08 PM EDT To: Ottie Glazier, MD; Christie Beckers, RN; *  Dr. Eulas Post, My name is  Eric Form, NP. I have been following Ms. Mcwright through the lung cancer screening program. The last screening scan we did on Ms. Reggio was 04/2021. It called for a 6 month follow up screening scan, which we ordered. I think Dr. Lanney Gins referred the patient to you and you did  a PET scan 07/2021, and another follow up CT Chest 11/2021.  It looks like your plan based on your note 12/07/2021  is to continue to follow  with a follow up CT Chest  in March 2024. It looks like she will be a candidate for SBRT if you plan on treating. Based on the fact that  it looks like you are following her lung nodules, please let me know that you plan to follow her from now on. If that is the case, we will remove her from the lung cancer screening program, and have her primary follow up with you. Please confirm this is an acceptable plan moving forward. I always find very clear communication is needed between providers  to ensure these patients do not fall through any cracks. I will wait for your response to confirm we can remove Ms. Durrett from the screening program as you are ordering imaging and following the nodule of concern. If you have any questions or concerns, please let me know. Thank You, Magdalen Spatz, MSN, AGACNP-BC [place]

## 2022-01-23 NOTE — Telephone Encounter (Signed)
Noted patient will be removed from lung cancer screening.

## 2022-01-30 ENCOUNTER — Other Ambulatory Visit: Payer: Self-pay | Admitting: *Deleted

## 2022-01-30 DIAGNOSIS — C3490 Malignant neoplasm of unspecified part of unspecified bronchus or lung: Secondary | ICD-10-CM

## 2022-02-08 ENCOUNTER — Ambulatory Visit (INDEPENDENT_AMBULATORY_CARE_PROVIDER_SITE_OTHER): Payer: Medicare HMO | Admitting: Family Medicine

## 2022-02-08 ENCOUNTER — Encounter: Payer: Self-pay | Admitting: Family Medicine

## 2022-02-08 VITALS — BP 107/67 | HR 91 | Temp 98.1°F | Ht 63.5 in | Wt 139.0 lb

## 2022-02-08 DIAGNOSIS — D485 Neoplasm of uncertain behavior of skin: Secondary | ICD-10-CM | POA: Diagnosis not present

## 2022-02-08 DIAGNOSIS — F411 Generalized anxiety disorder: Secondary | ICD-10-CM | POA: Diagnosis not present

## 2022-02-08 DIAGNOSIS — Z0000 Encounter for general adult medical examination without abnormal findings: Secondary | ICD-10-CM | POA: Diagnosis not present

## 2022-02-08 DIAGNOSIS — E782 Mixed hyperlipidemia: Secondary | ICD-10-CM

## 2022-02-08 DIAGNOSIS — I70212 Atherosclerosis of native arteries of extremities with intermittent claudication, left leg: Secondary | ICD-10-CM | POA: Diagnosis not present

## 2022-02-08 DIAGNOSIS — Z23 Encounter for immunization: Secondary | ICD-10-CM | POA: Diagnosis not present

## 2022-02-08 DIAGNOSIS — D489 Neoplasm of uncertain behavior, unspecified: Secondary | ICD-10-CM

## 2022-02-08 DIAGNOSIS — E559 Vitamin D deficiency, unspecified: Secondary | ICD-10-CM

## 2022-02-08 DIAGNOSIS — Z8619 Personal history of other infectious and parasitic diseases: Secondary | ICD-10-CM

## 2022-02-08 DIAGNOSIS — J4489 Other specified chronic obstructive pulmonary disease: Secondary | ICD-10-CM

## 2022-02-08 LAB — URINALYSIS, ROUTINE W REFLEX MICROSCOPIC
Bilirubin, UA: NEGATIVE
Glucose, UA: NEGATIVE
Ketones, UA: NEGATIVE
Leukocytes,UA: NEGATIVE
Nitrite, UA: NEGATIVE
Protein,UA: NEGATIVE
RBC, UA: NEGATIVE
Specific Gravity, UA: 1.025 (ref 1.005–1.030)
Urobilinogen, Ur: 0.2 mg/dL (ref 0.2–1.0)
pH, UA: 5 (ref 5.0–7.5)

## 2022-02-08 MED ORDER — MONTELUKAST SODIUM 10 MG PO TABS: ORAL_TABLET | ORAL | 1 refills | Status: DC

## 2022-02-08 MED ORDER — BUDESONIDE-FORMOTEROL FUMARATE 160-4.5 MCG/ACT IN AERO: 2.0000 | INHALATION_SPRAY | Freq: Two times a day (BID) | RESPIRATORY_TRACT | 3 refills | Status: DC

## 2022-02-08 MED ORDER — ALBUTEROL SULFATE HFA 108 (90 BASE) MCG/ACT IN AERS: 1.0000 | INHALATION_SPRAY | Freq: Four times a day (QID) | RESPIRATORY_TRACT | 6 refills | Status: DC | PRN

## 2022-02-08 MED ORDER — ALBUTEROL SULFATE (2.5 MG/3ML) 0.083% IN NEBU: 2.5000 mg | INHALATION_SOLUTION | Freq: Four times a day (QID) | RESPIRATORY_TRACT | 1 refills | Status: DC | PRN

## 2022-02-08 MED ORDER — ALPRAZOLAM 0.5 MG PO TABS: 0.5000 mg | ORAL_TABLET | Freq: Two times a day (BID) | ORAL | 1 refills | Status: DC | PRN

## 2022-02-08 NOTE — Assessment & Plan Note (Signed)
Under good control on current regimen. Continue current regimen. Continue to monitor. Call with any concerns. Refills given. Labs drawn today.   

## 2022-02-08 NOTE — Assessment & Plan Note (Signed)
Rechecking labs today. Await results. Treat as needed.  °

## 2022-02-08 NOTE — Assessment & Plan Note (Signed)
Will keep BP and cholesterol under good control. Continue to monitor. Call with any concerns.

## 2022-02-08 NOTE — Assessment & Plan Note (Signed)
Under good control on current regimen. Continue current regimen. Continue to monitor. Call with any concerns. Refills given for 3 months. Follow up 3 months.    

## 2022-02-08 NOTE — Progress Notes (Signed)
BP 107/67   Pulse 91   Temp 98.1 F (36.7 C) (Oral)   Ht 5' 3.5" (1.613 m)   Wt 139 lb (63 kg)   SpO2 95%   BMI 24.24 kg/m    Subjective:    Patient ID: Maureen [name], female    DOB: July 15, 1945, 76 y.o.   MRN: [id number]  HPI: Maureen [name] is a 76 y.o. female presenting on 02/08/2022 for comprehensive medical examination. Current medical complaints include:  Had c diff previously and started with diarrhea again a couple of days ago.   HYPERLIPIDEMIA Hyperlipidemia status:  Stable Satisfied with current treatment?  no Side effects:  N/A Past cholesterol meds: not on anything  Supplements: fish oil Aspirin:  no The 10-year ASCVD risk score (Arnett DK, et al., 2019) is: 16.6%   Values used to calculate the score:     Age: 51 years     Sex: Female     Is Non-Hispanic African American: No     Diabetic: No     Tobacco smoker: Yes     Systolic Blood Pressure: 423 mmHg     Is BP treated: No     HDL Cholesterol: 44 mg/dL     Total Cholesterol: 218 mg/dL Chest pain:  no  ANXIETY/STRESS Duration: chronic Chronic:stable Anxious mood: yes  Excessive worrying: no Irritability: no  Sweating: no Nausea: no Palpitations:no Hyperventilation: no Panic attacks: no Agoraphobia: no  Obscessions/compulsions: no Depressed mood: no    02/08/2022   10:25 AM 11/08/2021    2:53 PM 08/08/2021    2:01 PM 01/19/2021    9:21 AM 01/06/2020    2:55 PM  Depression screen PHQ 2/9  Decreased Interest 0 1 0 0 2  Down, Depressed, Hopeless 0 2 0 0 2  PHQ - 2 Score 0 3 0 0 4  Altered sleeping 0 1 1 0 0  Tired, decreased energy 0 2 1 0 0  Change in appetite 0 2 1 1  0  Feeling bad or failure about yourself  0 0 1 0 0  Trouble concentrating 0 1 1 0 0  Moving slowly or fidgety/restless 0 0 0 0 0  Suicidal thoughts 0 0 0 0 0  PHQ-9 Score 0 9 5 1 4   Difficult doing work/chores    Not difficult at all Not difficult at all   Anhedonia: no Weight changes: no Insomnia: no    Hypersomnia: no Fatigue/loss of energy: no Feelings of worthlessness: no Feelings of guilt: no Impaired concentration/indecisiveness: no Suicidal ideations: no  Crying spells: no Recent Stressors/Life Changes: no   Relationship problems: no   Family stress: no     Financial stress: no    Job stress: no    Recent death/loss: no  Menopausal Symptoms: no  Depression Screen done today and results listed below:     02/08/2022   10:25 AM 11/08/2021    2:53 PM 08/08/2021    2:01 PM 01/19/2021    9:21 AM 01/06/2020    2:55 PM  Depression screen PHQ 2/9  Decreased Interest 0 1 0 0 2  Down, Depressed, Hopeless 0 2 0 0 2  PHQ - 2 Score 0 3 0 0 4  Altered sleeping 0 1 1 0 0  Tired, decreased energy 0 2 1 0 0  Change in appetite 0 2 1 1  0  Feeling bad or failure about yourself  0 0 1 0 0  Trouble concentrating 0 1 1 0 0  Moving slowly or fidgety/restless 0 0 0 0 0  Suicidal thoughts 0 0 0 0 0  PHQ-9 Score 0 9 5 1 4   Difficult doing work/chores    Not difficult at all Not difficult at all    Past Medical History:  Past Medical History:  Diagnosis Date   Abdominal aortic aneurysm (AAA) without rupture ([place])    followed by Dr Delana Meyer   Abnormal EKG    pt states ekg always shows she is having a heart attack   Arthritis 2005   Breast cancer, left breast ([place]) 2011   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 This was a histologic grade 2 tumor measuring just under 1 cm in diameter. No invasive cancer was identified. ER 90%, PR 40%.   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[facility])    GERD (gastroesophageal reflux disease)    Heart murmur 2005   Hypercholesterolemia 2012   Mammographic microcalcification 2011   Panic attack     Personal history of chemotherapy 1994   Personal history of colonic polyps    Colon polyps   Personal history of other malignant neoplasm of skin 2015   basal cell   Personal history of tobacco use, presenting hazards to health    PUD (peptic ulcer disease)    Last EGD 1/09   Pulmonary nodule    Rectal polyp 2008   villous adenoma; last colonoscopy 1/09 WNL   Skin cancer, basal cell 2015   right cheek Mohs surgery at [place] use    Ulcer     Surgical History:  Past Surgical History:  Procedure Laterality Date   APPENDECTOMY  2011   BREAST BIOPSY  12/27/2009   right breast   CARDIAC CATHETERIZATION  1996   Negative Cath   COLONOSCOPY N/A 08/05/2014   Procedure: COLONOSCOPY;  Surgeon: [name], MD;  Location: [facility] ENDOSCOPY;  EGD, tubular adenoma 1.   COLONOSCOPY W/ BIOPSIES  2005,2009   Dr. Bary Castilla. Normal exam 2009. Villous adenoma of the rectum in 2005 adenomatous polyp from the ascending colon and rectum in 2002.   ESOPHAGOGASTRODUODENOSCOPY N/A 08/05/2014   Procedure: ESOPHAGOGASTRODUODENOSCOPY (EGD);  Surgeon: [name], MD;  Location: [facility] ENDOSCOPY;  Service: Endoscopy;  Laterality: N/A;   ESOPHAGOGASTRODUODENOSCOPY (EGD) WITH PROPOFOL N/A 07/25/2017   Procedure: ESOPHAGOGASTRODUODENOSCOPY (EGD) WITH PROPOFOL;  Surgeon: [name], MD;  Location: [facility] ENDOSCOPY;  Service: Endoscopy;  Laterality: N/A;   ESOPHAGOGASTRODUODENOSCOPY (EGD) WITH PROPOFOL N/A 08/03/2021   Procedure: ESOPHAGOGASTRODUODENOSCOPY (EGD) WITH PROPOFOL;  Surgeon: [name], MD;  Location: [facility] ENDOSCOPY;  Service: Endoscopy;  Laterality: N/A;   LAPAROSCOPIC BILATERAL SALPINGO OOPHERECTOMY Bilateral 10/05/2021   Procedure: LAPAROSCOPIC BILATERAL SALPINGO OOPHORECTOMY, REMOVAL OF PELVIC MASSES, MINI LAPAROTOMY;  Surgeon: Mellody Drown, MD;  Location: [facility] ORS;  Service: Gynecology;  Laterality: Bilateral;   MASTECTOMY     bilateral   SIMPLE MASTECTOMY   1993   right   SIMPLE MASTECTOMY  2011   left   TONSILLECTOMY  1997   UPPER GI ENDOSCOPY  2003, 2005, 2009, 2012, 2014   2012 showed mild reflux changes without evidence of Barrett's epithelial changes 2014 biopsies at 37 cm showed changes suggestive of reflux esophagitis. No metaplasia.  VAGINAL HYSTERECTOMY  1977    Medications:  Current Outpatient Medications on File Prior to Visit  Medication Sig   Ascorbic Acid (VITAMIN C) 100 MG tablet Take 100 mg by mouth daily.   Calcium-Vitamins C & D (CALCIUM/C/D PO) Take 1 tablet by mouth daily.   cetirizine (ZYRTEC ALLERGY) 10 MG tablet Take 10 mg by mouth every morning.   Cholecalciferol (VITAMIN D3) 50 MCG (2000 UT) TABS Take 1 tablet by mouth daily at 6 (six) AM.   clindamycin (CLEOCIN) 300 MG capsule Take 300 mg by mouth 3 (three) times daily.   fluticasone (FLONASE) 50 MCG/ACT nasal spray Place 2 sprays into the nose every morning.   hydrocortisone (ANUSOL-HC) 25 MG suppository Place 25 mg rectally 2 (two) times daily.   ketoconazole (NIZORAL) 2 % cream Apply 1 Application topically daily.   KRILL OIL PO Take by mouth daily.   omeprazole (PRILOSEC) 20 MG capsule Take 1 capsule (20 mg total) by mouth every morning.   vancomycin (VANCOCIN) 125 MG capsule Take 125 mg by mouth 4 (four) times daily.   vitamin E 100 UNIT capsule Take 100 Units by mouth daily.   zinc gluconate 50 MG tablet Take 50 mg by mouth as needed.   No current facility-administered medications on file prior to visit.    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 She has two adult daughters. She lives in her home. Her youngest daughter and grandchildren live with her. She has two dogs. 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 She loves watching her grandson play basketball. She also loves gardening and being outdoors and working in the yard.    Social Determinants of Health   Financial Resource Strain: Not on file  Food Insecurity: Not on file  Transportation Needs: Not on file  Physical Activity: Not on file  Stress: Not on file  Social Connections: Not on file  Intimate Partner Violence: Not on file   Social History   Tobacco Use  Smoking Status Every Day   Packs/day: 0.50   Years: 40.00   Total pack years: 20.00  Types: Cigarettes  Smokeless Tobacco Never  Tobacco Comments   Pt wants to do hypnotizing   Social History   Substance and Sexual Activity  Alcohol Use Yes   Alcohol/week: 1.0 standard drink of alcohol   Types: 1 Cans of beer per week   Comment: 1 beer weekly    Family History:  Family History  Problem Relation Age of Onset   Cancer Father        esophagus   Cancer Maternal Aunt        breast cancer   Cancer Maternal Grandmother        breast cancer    Past medical history,  surgical history, medications, allergies, family history and social history reviewed with patient today and changes made to appropriate areas of the chart.   Review of Systems  Constitutional:  Positive for chills. Negative for diaphoresis, fever, malaise/fatigue and weight loss.  HENT: Negative.    Eyes: Negative.   Respiratory:  Positive for cough. Negative for hemoptysis, sputum production, shortness of breath and wheezing.   Cardiovascular:  Positive for palpitations. Negative for chest pain, orthopnea, claudication, leg swelling and PND.  Gastrointestinal:  Positive for diarrhea. Negative for abdominal pain, blood in stool, constipation, heartburn, melena, nausea and vomiting.  Genitourinary: Negative.   Musculoskeletal: Negative.   Skin: Negative.        Mole on her back  Neurological: Negative.   Endo/Heme/Allergies:  Positive for environmental allergies. Negative for polydipsia. Does not bruise/bleed easily.  Psychiatric/Behavioral:  Negative for depression, hallucinations, memory loss, substance abuse and suicidal ideas. The patient is nervous/anxious. The patient does not have insomnia.    All other ROS negative except what is listed above and in the HPI.      Objective:    BP 107/67   Pulse 91   Temp 98.1 F (36.7 C) (Oral)   Ht 5' 3.5" (1.613 m)   Wt 139 lb (63 kg)   SpO2 95%   BMI 24.24 kg/m   Wt Readings from Last 3 Encounters:  02/08/22 139 lb (63 kg)  12/07/21 141 lb 1.6 oz (64 kg)  11/14/21 143 lb 3.2 oz (65 kg)    Physical Exam Vitals and nursing note reviewed.  Constitutional:      General: She is not in acute distress.    Appearance: Normal appearance. She is not ill-appearing, toxic-appearing or diaphoretic.  HENT:     Head: Normocephalic and atraumatic.     Right Ear: Tympanic membrane, ear canal and external ear normal. There is no impacted cerumen.     Left Ear: Tympanic membrane, ear canal and external ear normal. There is no impacted cerumen.      Nose: Nose normal. No congestion or rhinorrhea.     Mouth/Throat:     Mouth: Mucous membranes are moist.     Pharynx: Oropharynx is clear. No oropharyngeal exudate or posterior oropharyngeal erythema.  Eyes:     General: No scleral icterus.       Right eye: No discharge.        Left eye: No discharge.     Extraocular Movements: Extraocular movements intact.     Conjunctiva/sclera: Conjunctivae normal.     Pupils: Pupils are equal, round, and reactive to light.  Neck:     Vascular: No carotid bruit.  Cardiovascular:     Rate and Rhythm: Normal rate and regular rhythm.     Pulses: Normal pulses.     Heart sounds: No murmur heard.  No friction rub. No gallop.  Pulmonary:     Effort: Pulmonary effort is normal. No respiratory distress.     Breath sounds: Normal breath sounds. No stridor. No wheezing, rhonchi or rales.  Chest:     Chest wall: No tenderness.  Abdominal:     General: Abdomen is flat. Bowel sounds are normal. There is no distension.     Palpations: Abdomen is soft. There is no mass.     Tenderness: There is no abdominal tenderness. There is no right CVA tenderness, left CVA tenderness, guarding or rebound.     Hernia: No hernia is present.  Genitourinary:    Comments: Breast and pelvic exams deferred with shared decision making Musculoskeletal:        General: No swelling, tenderness, deformity or signs of injury.     Cervical back: Normal range of motion and neck supple. No rigidity. No muscular tenderness.     Right lower leg: No edema.     Left lower leg: No edema.  Lymphadenopathy:     Cervical: No cervical adenopathy.  Skin:    General: Skin is warm and dry.     Capillary Refill: Capillary refill takes less than 2 seconds.     Coloration: Skin is not jaundiced or pale.     Findings: No bruising, erythema, lesion or rash.     Comments: 1cm rased hyperpigmented mole on back, concern for basal cell  Neurological:     General: No focal deficit present.      Mental Status: She is alert and oriented to person, place, and time. Mental status is at baseline.     Cranial Nerves: No cranial nerve deficit.     Sensory: No sensory deficit.     Motor: No weakness.     Coordination: Coordination normal.     Gait: Gait normal.     Deep Tendon Reflexes: Reflexes normal.  Psychiatric:        Mood and Affect: Mood normal.        Behavior: Behavior normal.        Thought Content: Thought content normal.        Judgment: Judgment normal.     Results for orders placed or performed in visit on 11/24/21  Comprehensive metabolic panel  Result Value Ref Range   Sodium 140 135 - 145 mmol/L   Potassium 4.1 3.5 - 5.1 mmol/L   Chloride 109 98 - 111 mmol/L   CO2 24 22 - 32 mmol/L   Glucose, Bld 101 (H) 70 - 99 mg/dL   BUN 11 8 - 23 mg/dL   Creatinine, Ser 1.06 (H) 0.44 - 1.00 mg/dL   Calcium 10.1 8.9 - 10.3 mg/dL   Total Protein 7.4 6.5 - 8.1 g/dL   Albumin 4.1 3.5 - 5.0 g/dL   AST 19 15 - 41 U/L   ALT 22 0 - 44 U/L   Alkaline Phosphatase 101 38 - 126 U/L   Total Bilirubin 0.5 0.3 - 1.2 mg/dL   GFR, Estimated 55 (L) >60 mL/min   Anion gap 7 5 - 15  CBC  Result Value Ref Range   WBC 6.8 4.0 - 10.5 K/uL   RBC 4.28 3.87 - 5.11 MIL/uL   Hemoglobin 13.0 12.0 - 15.0 g/dL   HCT 39.1 36.0 - 46.0 %   MCV 91.4 80.0 - 100.0 fL   MCH 30.4 26.0 - 34.0 pg   MCHC 33.2 30.0 - 36.0 g/dL   RDW 13.8 11.5 - 15.5 %  Platelets 221 150 - 400 K/uL   nRBC 0.0 0.0 - 0.2 %      Assessment & Plan:   Problem List Items Addressed This Visit       Cardiovascular and Mediastinum   Atherosclerosis of native arteries of extremity with intermittent claudication (HCC)    Will keep BP and cholesterol under good control. Continue to monitor. Call with any concerns.       Relevant Orders   CBC with Differential/Platelet   Comprehensive metabolic panel   Urinalysis, Routine w reflex microscopic   TSH     Respiratory   COPD (chronic obstructive pulmonary disease) with  chronic bronchitis    Under good control on current regimen. Continue current regimen. Continue to monitor. Call with any concerns. Refills given. Labs drawn today.        Relevant Medications   albuterol (PROVENTIL) (2.5 MG/3ML) 0.083% nebulizer solution   albuterol (VENTOLIN HFA) 108 (90 Base) MCG/ACT inhaler   budesonide-formoterol (SYMBICORT) 160-4.5 MCG/ACT inhaler   montelukast (SINGULAIR) 10 MG tablet   Other Relevant Orders   CBC with Differential/Platelet   Comprehensive metabolic panel   Urinalysis, Routine w reflex microscopic   TSH     Other   Generalized anxiety disorder    Under good control on current regimen. Continue current regimen. Continue to monitor. Call with any concerns. Refills given for 3 months. Follow up 3 months.        Relevant Medications   ALPRAZolam (XANAX) 0.5 MG tablet   Other Relevant Orders   CBC with Differential/Platelet   Comprehensive metabolic panel   Urinalysis, Routine w reflex microscopic   TSH   Hyperlipidemia    Rechecking labs today. Await results. Treat as needed.       Relevant Orders   CBC with Differential/Platelet   Comprehensive metabolic panel   Lipid Panel w/o Chol/HDL Ratio   Urinalysis, Routine w reflex microscopic   TSH   Vitamin D deficiency    Rechecking labs today. Await results. Treat as needed.       Relevant Orders   CBC with Differential/Platelet   Comprehensive metabolic panel   Urinalysis, Routine w reflex microscopic   TSH   VITAMIN D 25 Hydroxy (Vit-D Deficiency, Fractures)   Other Visit Diagnoses     Routine general medical examination at a health care facility    -  Primary   Vaccines updated. Screening labs checked today. Mammo N/A. Colonoscopy and DEXA up to date. Continue diet and exercise. Call with any concerns.   Neoplasm of uncertain behavior       Will shave biopsy to send for path, but we are out of the specimen cups- will get her back ASAP. Td given today. Call with any concerns.    Relevant Orders   Td : Tetanus/diphtheria >7yo Preservative  free (Completed)   Surgical pathology   History of Clostridioides difficile colitis       If diarrhea persists will do sample. Await results.   Relevant Orders   Stool C-Diff Toxin Assay   Needs flu shot       Relevant Orders   Flu Vaccine QUAD High Dose(Fluad) (Completed)        Follow up plan: Return As soon as we get the pathology specimen cups in.   LABORATORY TESTING:  - Pap smear: not applicable  IMMUNIZATIONS:   - Tdap: Tetanus vaccination status reviewed: Td vaccination indicated and given today. - Influenza: Administered today - Pneumovax: Up to date -  Prevnar: Up to date - COVID: Up to date - HPV: Not applicable - Shingrix vaccine: Refused  SCREENING: -Mammogram: Not applicable  - Colonoscopy: Up to date  - Bone Density: Up to date   PATIENT COUNSELING:   Advised to take 1 mg of folate supplement per day if capable of pregnancy.   Sexuality: Discussed sexually transmitted diseases, partner selection, use of condoms, avoidance of unintended pregnancy  and contraceptive alternatives.   Advised to avoid cigarette smoking.  I discussed with the patient that most people either abstain from alcohol or drink within safe limits (<=14/week and <=4 drinks/occasion for males, <=7/weeks and <= 3 drinks/occasion for females) and that the risk for alcohol disorders and other health effects rises proportionally with the number of drinks per week and how often a drinker exceeds daily limits.  Discussed cessation/primary prevention of drug use and availability of treatment for abuse.   Diet: Encouraged to adjust caloric intake to maintain  or achieve ideal body weight, to reduce intake of dietary saturated fat and total fat, to limit sodium intake by avoiding high sodium foods and not adding table salt, and to maintain adequate dietary potassium and calcium preferably from fresh fruits, vegetables, and low-fat  dairy products.    stressed the importance of regular exercise  Injury prevention: Discussed safety belts, safety helmets, smoke detector, smoking near bedding or upholstery.   Dental health: Discussed importance of regular tooth brushing, flossing, and dental visits.    NEXT PREVENTATIVE PHYSICAL DUE IN 1 YEAR. Return As soon as we get the pathology specimen cups in.

## 2022-02-09 LAB — COMPREHENSIVE METABOLIC PANEL
ALT: 20 IU/L (ref 0–32)
AST: 17 IU/L (ref 0–40)
Albumin/Globulin Ratio: 1.9 (ref 1.2–2.2)
Albumin: 4.4 g/dL (ref 3.8–4.8)
Alkaline Phosphatase: 131 IU/L — ABNORMAL HIGH (ref 44–121)
BUN/Creatinine Ratio: 14 (ref 12–28)
BUN: 17 mg/dL (ref 8–27)
Bilirubin Total: 0.2 mg/dL (ref 0.0–1.2)
CO2: 18 mmol/L — ABNORMAL LOW (ref 20–29)
Calcium: 10.2 mg/dL (ref 8.7–10.3)
Chloride: 105 mmol/L (ref 96–106)
Creatinine, Ser: 1.2 mg/dL — ABNORMAL HIGH (ref 0.57–1.00)
Globulin, Total: 2.3 g/dL (ref 1.5–4.5)
Glucose: 90 mg/dL (ref 70–99)
Potassium: 4.3 mmol/L (ref 3.5–5.2)
Sodium: 142 mmol/L (ref 134–144)
Total Protein: 6.7 g/dL (ref 6.0–8.5)
eGFR: 47 mL/min/{1.73_m2} — ABNORMAL LOW (ref >59)

## 2022-02-09 LAB — VITAMIN D 25 HYDROXY (VIT D DEFICIENCY, FRACTURES): Vit D, 25-Hydroxy: 13.2 ng/mL — ABNORMAL LOW (ref 30.0–100.0)

## 2022-02-09 LAB — CBC WITH DIFFERENTIAL/PLATELET
Basophils Absolute: 0.1 10*3/uL (ref 0.0–0.2)
Basos: 1 %
EOS (ABSOLUTE): 0.2 10*3/uL (ref 0.0–0.4)
Eos: 2 %
Hematocrit: 40.4 % (ref 34.0–46.6)
Hemoglobin: 13.2 g/dL (ref 11.1–15.9)
Immature Grans (Abs): 0 10*3/uL (ref 0.0–0.1)
Immature Granulocytes: 0 %
Lymphocytes Absolute: 2 10*3/uL (ref 0.7–3.1)
Lymphs: 27 %
MCH: 30.4 pg (ref 26.6–33.0)
MCHC: 32.7 g/dL (ref 31.5–35.7)
MCV: 93 fL (ref 79–97)
Monocytes Absolute: 0.5 10*3/uL (ref 0.1–0.9)
Monocytes: 6 %
Neutrophils Absolute: 4.7 10*3/uL (ref 1.4–7.0)
Neutrophils: 64 %
Platelets: 204 10*3/uL (ref 150–450)
RBC: 4.34 x10E6/uL (ref 3.77–5.28)
RDW: 14.1 % (ref 11.7–15.4)
WBC: 7.4 10*3/uL (ref 3.4–10.8)

## 2022-02-09 LAB — LIPID PANEL W/O CHOL/HDL RATIO
Cholesterol, Total: 237 mg/dL — ABNORMAL HIGH (ref 100–199)
HDL: 42 mg/dL (ref >39)
LDL Chol Calc (NIH): 167 mg/dL — ABNORMAL HIGH (ref 0–99)
Triglycerides: 155 mg/dL — ABNORMAL HIGH (ref 0–149)
VLDL Cholesterol Cal: 28 mg/dL (ref 5–40)

## 2022-02-09 LAB — TSH: TSH: 1.54 u[IU]/mL (ref 0.450–4.500)

## 2022-02-16 ENCOUNTER — Other Ambulatory Visit: Payer: Self-pay | Admitting: Family Medicine

## 2022-02-16 MED ORDER — VITAMIN D (ERGOCALCIFEROL) 1.25 MG (50000 UNIT) PO CAPS: 50000.0000 [IU] | ORAL_CAPSULE | ORAL | 1 refills | Status: DC

## 2022-02-17 LAB — CLOSTRIDIUM DIFFICILE EIA: C difficile Toxins A+B, EIA: NEGATIVE

## 2022-03-09 ENCOUNTER — Other Ambulatory Visit (HOSPITAL_COMMUNITY)
Admission: RE | Admit: 2022-03-09 | Discharge: 2022-03-09 | Disposition: A | Discharge status: Home / Self Care | Payer: Medicare HMO | Source: Ambulatory Visit | Attending: Family Medicine | Admitting: Family Medicine

## 2022-03-09 ENCOUNTER — Ambulatory Visit (INDEPENDENT_AMBULATORY_CARE_PROVIDER_SITE_OTHER): Payer: Medicare HMO | Admitting: Family Medicine

## 2022-03-09 ENCOUNTER — Encounter: Payer: Self-pay | Admitting: Family Medicine

## 2022-03-09 VITALS — BP 113/73 | HR 85 | Temp 98.2°F | Ht 63.5 in | Wt 139.1 lb

## 2022-03-09 DIAGNOSIS — D485 Neoplasm of uncertain behavior of skin: Secondary | ICD-10-CM | POA: Insufficient documentation

## 2022-03-09 DIAGNOSIS — C4492 Squamous cell carcinoma of skin, unspecified: Secondary | ICD-10-CM

## 2022-03-09 DIAGNOSIS — C44529 Squamous cell carcinoma of skin of other part of trunk: Secondary | ICD-10-CM

## 2022-03-09 HISTORY — DX: Squamous cell carcinoma of skin, unspecified: C44.92

## 2022-03-09 NOTE — Progress Notes (Signed)
BP 113/73   Pulse 85   Temp 98.2 F (36.8 C) (Oral)   Ht 5' 3.5" (1.613 m)   Wt 139 lb 1.6 oz (63.1 kg)   SpO2 96%   BMI 24.25 kg/m    Subjective:    Patient ID: Maureen [name], female    DOB: 1945/05/19, 76 y.o.   MRN: [id number]  HPI: Maureen [name] is a 76 y.o. female  Chief Complaint  Patient presents with   Skin Problem   SKIN LESION Duration: weeks Location: back Painful: no Itching: yes Onset: gradual Context: bigger History of skin cancer: no History of precancerous skin lesions: yes Family history of skin cancer: no  Relevant past medical, surgical, family and social history reviewed and updated as indicated. Interim medical history since our last visit reviewed. Allergies and medications reviewed and updated.  Review of Systems  Constitutional: Negative.   Respiratory: Negative.    Cardiovascular: Negative.   Gastrointestinal: Negative.   Musculoskeletal: Negative.   Psychiatric/Behavioral: Negative.      Per HPI unless specifically indicated above     Objective:    BP 113/73   Pulse 85   Temp 98.2 F (36.8 C) (Oral)   Ht 5' 3.5" (1.613 m)   Wt 139 lb 1.6 oz (63.1 kg)   SpO2 96%   BMI 24.25 kg/m   Wt Readings from Last 3 Encounters:  03/09/22 139 lb 1.6 oz (63.1 kg)  02/08/22 139 lb (63 kg)  12/07/21 141 lb 1.6 oz (64 kg)    Physical Exam Vitals and nursing note reviewed.  Constitutional:      General: She is not in acute distress.    Appearance: Normal appearance. She is well-developed.  HENT:     Head: Normocephalic and atraumatic.     Right Ear: Hearing and external ear normal.     Left Ear: Hearing and external ear normal.     Nose: Nose normal.     Mouth/Throat:     Mouth: Mucous membranes are moist.     Pharynx: Oropharynx is clear.  Eyes:     General: Lids are normal. No scleral icterus.       Right eye: No discharge.        Left eye: No discharge.     Conjunctiva/sclera: Conjunctivae normal.  Pulmonary:      Effort: Pulmonary effort is normal. No respiratory distress.  Musculoskeletal:        General: Normal range of motion.  Skin:    Coloration: Skin is not jaundiced or pale.     Findings: No bruising, erythema, lesion or rash.     Comments: 1cm rased hyperpigmented mole on back, concern for basal cell   Neurological:     Mental Status: She is alert. Mental status is at baseline. She is disoriented.  Psychiatric:        Mood and Affect: Mood normal.        Speech: Speech normal.        Behavior: Behavior normal.        Thought Content: Thought content normal.        Judgment: Judgment normal.     Results for orders placed or performed in visit on 02/08/22  Stool C-Diff Toxin Assay   Specimen: Stool   ST  Result Value Ref Range   C difficile Toxins A+B, EIA Negative Negative  CBC with Differential/Platelet  Result Value Ref Range   WBC 7.4 3.4 - 10.8 x10E3/uL   RBC  4.34 3.77 - 5.28 x10E6/uL   Hemoglobin 13.2 11.1 - 15.9 g/dL   Hematocrit 40.4 34.0 - 46.6 %   MCV 93 79 - 97 fL   MCH 30.4 26.6 - 33.0 pg   MCHC 32.7 31.5 - 35.7 g/dL   RDW 14.1 11.7 - 15.4 %   Platelets 204 150 - 450 x10E3/uL   Neutrophils 64 Not Estab. %   Lymphs 27 Not Estab. %   Monocytes 6 Not Estab. %   Eos 2 Not Estab. %   Basos 1 Not Estab. %   Neutrophils Absolute 4.7 1.4 - 7.0 x10E3/uL   Lymphocytes Absolute 2.0 0.7 - 3.1 x10E3/uL   Monocytes Absolute 0.5 0.1 - 0.9 x10E3/uL   EOS (ABSOLUTE) 0.2 0.0 - 0.4 x10E3/uL   Basophils Absolute 0.1 0.0 - 0.2 x10E3/uL   Immature Granulocytes 0 Not Estab. %   Immature Grans (Abs) 0.0 0.0 - 0.1 x10E3/uL  Comprehensive metabolic panel  Result Value Ref Range   Glucose 90 70 - 99 mg/dL   BUN 17 8 - 27 mg/dL   Creatinine, Ser 1.20 (H) 0.57 - 1.00 mg/dL   eGFR 47 (L) >59 mL/min/1.73   BUN/Creatinine Ratio 14 12 - 28   Sodium 142 134 - 144 mmol/L   Potassium 4.3 3.5 - 5.2 mmol/L   Chloride 105 96 - 106 mmol/L   CO2 18 (L) 20 - 29 mmol/L   Calcium 10.2 8.7 -  10.3 mg/dL   Total Protein 6.7 6.0 - 8.5 g/dL   Albumin 4.4 3.8 - 4.8 g/dL   Globulin, Total 2.3 1.5 - 4.5 g/dL   Albumin/Globulin Ratio 1.9 1.2 - 2.2   Bilirubin Total 0.2 0.0 - 1.2 mg/dL   Alkaline Phosphatase 131 (H) 44 - 121 IU/L   AST 17 0 - 40 IU/L   ALT 20 0 - 32 IU/L  Lipid Panel w/o Chol/HDL Ratio  Result Value Ref Range   Cholesterol, Total 237 (H) 100 - 199 mg/dL   Triglycerides 155 (H) 0 - 149 mg/dL   HDL 42 >39 mg/dL   VLDL Cholesterol Cal 28 5 - 40 mg/dL   LDL Chol Calc (NIH) 167 (H) 0 - 99 mg/dL  Urinalysis, Routine w reflex microscopic  Result Value Ref Range   Specific Gravity, UA 1.025 1.005 - 1.030   pH, UA 5.0 5.0 - 7.5   Color, UA Yellow Yellow   Appearance Ur Clear Clear   Leukocytes,UA Negative Negative   Protein,UA Negative Negative/Trace   Glucose, UA Negative Negative   Ketones, UA Negative Negative   RBC, UA Negative Negative   Bilirubin, UA Negative Negative   Urobilinogen, Ur 0.2 0.2 - 1.0 mg/dL   Nitrite, UA Negative Negative   Microscopic Examination Comment   TSH  Result Value Ref Range   TSH 1.540 0.450 - 4.500 uIU/mL  VITAMIN D 25 Hydroxy (Vit-D Deficiency, Fractures)  Result Value Ref Range   Vit D, 25-Hydroxy 13.2 (L) 30.0 - 100.0 ng/mL      Assessment & Plan:   Problem List Items Addressed This Visit   None Visit Diagnoses     Neoplasm of uncertain behavior of skin    -  Primary   Removed today as below. Will send for pathology.   Relevant Orders   Surgical pathology      Skin Procedure  Procedure: Informed consent given.  Sterile prep of the area.  Area infiltrated with lidocaine without epinephrine.  Using a surgical blade, part of the  upper dermis shaved off and sent  for pathology.  Area cauterized. Pt ed on scarring.  Diagnosis:   ICD-10-CM   1. Neoplasm of uncertain behavior of skin  D48.5 Surgical pathology   Removed today as below. Will send for pathology.      Lesion Location/Size: 1cm rased hyperpigmented  mole on back, concern for basal cell   Physician: MJ Consent:  Risks, benefits, and alternative treatments discussed and all questions were answered.  Patient elected to proceed and verbal consent obtained.  Description: Area prepped and draped using semi-sterile technique. Area locally anesthetized using 3 cc's of lidocaine 1% plain. Shave biopsy of lesion performed using a dermablade.  Adequate hemostastis achieved using Silver Nitrate. Wound dressed with application of bacitratin.  Post Procedure Instructions:  Wound care instructions discussed and patient was instructed to keep area clean and dry.  Signs and symptoms of infection discussed, patient agrees to contact the office ASAP should they occur.  Dressing change recommended every other day.   Follow up plan: Return if symptoms worsen or fail to improve.

## 2022-03-14 LAB — SURGICAL PATHOLOGY

## 2022-03-15 ENCOUNTER — Telehealth: Payer: Self-pay | Admitting: Family Medicine

## 2022-03-15 NOTE — Addendum Note (Signed)
Addended by: Valerie Roys on: 03/15/2022 09:42 AM   Modules accepted: Orders

## 2022-03-15 NOTE — Telephone Encounter (Signed)
Spoke with referral coordinator to assist with referral to Dermatology. Patient wanted to have the location changed to [facility], but they are booked all the way out until September per Gustavus Messing referral coordinator. Patient spoke with Evelena Peat and is all taken care of and good to go.

## 2022-03-15 NOTE — Telephone Encounter (Signed)
Copied from [place] [phone]. Topic: General - Other >> Mar 15, 2022 10:03 AM Cyndi Bender wrote: Reason for CRM: Pt requested to speak with Destiny. Pt stated that she needs an appt at [facility] with Dr. Nicole Kindred phone# [phone]

## 2022-03-16 ENCOUNTER — Ambulatory Visit: Payer: Self-pay

## 2022-03-16 ENCOUNTER — Ambulatory Visit: Payer: Medicare HMO | Admitting: Family Medicine

## 2022-03-16 ENCOUNTER — Encounter: Payer: Self-pay | Admitting: Family Medicine

## 2022-03-16 VITALS — BP 120/70 | HR 84 | Temp 98.4°F | Ht 63.5 in | Wt 139.7 lb

## 2022-03-16 DIAGNOSIS — L089 Local infection of the skin and subcutaneous tissue, unspecified: Secondary | ICD-10-CM | POA: Diagnosis not present

## 2022-03-16 DIAGNOSIS — T148XXA Other injury of unspecified body region, initial encounter: Secondary | ICD-10-CM

## 2022-03-16 MED ORDER — MUPIROCIN 2 % EX OINT: 1.0000 | TOPICAL_OINTMENT | Freq: Two times a day (BID) | CUTANEOUS | 0 refills | Status: DC

## 2022-03-16 NOTE — Progress Notes (Signed)
BP 120/70   Pulse 84   Temp 98.4 F (36.9 C) (Oral)   Ht 5' 3.5" (1.613 m)   Wt 139 lb 11.2 oz (63.4 kg)   SpO2 96%   BMI 24.36 kg/m    Subjective:    Patient ID: Maureen [name], female    DOB: 1945-12-23, 76 y.o.   MRN: [id number]  HPI: Maureen [name] is a 76 y.o. female  Chief Complaint  Patient presents with   Wound Check   SKIN INFECTION Duration: about a week Location: back History of trauma in area: yes Pain: yes Quality: sore and shooting Severity: moderate Redness: yes Swelling: yes Oozing: no Pus: no Fevers: no Nausea/vomiting: no Status: worse Treatments attempted:none  Tetanus: UTD   Relevant past medical, surgical, family and social history reviewed and updated as indicated. Interim medical history since our last visit reviewed. Allergies and medications reviewed and updated.  Review of Systems  Constitutional: Negative.   Respiratory: Negative.    Cardiovascular: Negative.   Gastrointestinal: Negative.   Musculoskeletal: Negative.   Skin:  Positive for color change and wound. Negative for pallor and rash.  Psychiatric/Behavioral: Negative.      Per HPI unless specifically indicated above     Objective:    BP 120/70   Pulse 84   Temp 98.4 F (36.9 C) (Oral)   Ht 5' 3.5" (1.613 m)   Wt 139 lb 11.2 oz (63.4 kg)   SpO2 96%   BMI 24.36 kg/m   Wt Readings from Last 3 Encounters:  03/16/22 139 lb 11.2 oz (63.4 kg)  03/09/22 139 lb 1.6 oz (63.1 kg)  02/08/22 139 lb (63 kg)    Physical Exam Vitals and nursing note reviewed.  Constitutional:      General: She is not in acute distress.    Appearance: Normal appearance. She is normal weight. She is not ill-appearing, toxic-appearing or diaphoretic.  HENT:     Head: Normocephalic and atraumatic.     Right Ear: External ear normal.     Left Ear: External ear normal.     Nose: Nose normal.     Mouth/Throat:     Mouth: Mucous membranes are moist.     Pharynx: Oropharynx is clear.   Eyes:     General: No scleral icterus.       Right eye: No discharge.        Left eye: No discharge.     Extraocular Movements: Extraocular movements intact.     Conjunctiva/sclera: Conjunctivae normal.     Pupils: Pupils are equal, round, and reactive to light.  Cardiovascular:     Rate and Rhythm: Normal rate and regular rhythm.     Pulses: Normal pulses.     Heart sounds: Normal heart sounds. No murmur heard.    No friction rub. No gallop.  Pulmonary:     Effort: Pulmonary effort is normal. No respiratory distress.     Breath sounds: Normal breath sounds. No stridor. No wheezing, rhonchi or rales.  Chest:     Chest wall: No tenderness.  Musculoskeletal:        General: Normal range of motion.     Cervical back: Normal range of motion and neck supple.  Skin:    General: Skin is warm and dry.     Capillary Refill: Capillary refill takes less than 2 seconds.     Coloration: Skin is not jaundiced or pale.     Findings: No bruising, erythema, lesion or rash.  Comments: Well healing wound, mild redness around area with slight increased heat  Neurological:     General: No focal deficit present.     Mental Status: She is alert and oriented to person, place, and time. Mental status is at baseline.  Psychiatric:        Mood and Affect: Mood normal.        Behavior: Behavior normal.        Thought Content: Thought content normal.        Judgment: Judgment normal.         Assessment & Plan:   Problem List Items Addressed This Visit   None Visit Diagnoses     Wound infection    -  Primary   Will treat with bactroban due to allergies. Call with any concern. Continue to monitor.   Relevant Medications   mupirocin ointment (BACTROBAN) 2 %        Follow up plan: Return if symptoms worsen or fail to improve.

## 2022-03-16 NOTE — Telephone Encounter (Signed)
   Chief Complaint: Skin lesion removed last week and now has pus, redness, very painful, chills.Pt. Asking to be worked in today. Symptoms: Above Frequency: Last night Pertinent Negatives: Patient denies fever Disposition: [] ED /[] Urgent Care (no appt availability in office) / [] Appointment(In office/virtual)/ []  [place] [facility]/ [] Home Care/ [] Refused Recommended Disposition /[] [place] [facility]/ [x]  Follow-up with PCP Additional Notes: Please advise pt.   Answer Assessment - Initial Assessment Questions 1. APPEARANCE of LESION: "What does it look like?"      Removed in office 2. SIZE: "How big is it?" (e.g., inches, cm; or compare to size of pinhead, tip of pen, eraser, coin, pea, grape, ping pong ball)      Quarter 3. COLOR: "What color is it?" "Is there more than one color?"     Has pus inn/a middle 4. SHAPE: "What shape is it?" (e.g., round, irregular)     Circle 5. RAISED: "Does it stick up above the skin or is it flat?" (e.g., raised or elevated)     N/a 6. TENDER: "Does it hurt when you touch it?"  (Scale 1-10; or mild, moderate, severe)     Painful 7. LOCATION: "Where is it located?"      Back 8. ONSET: "When did it first appear?"      Weeks 9. NUMBER: "Is there just one?" or "Are there others?"     1 10. CAUSE: "What do you think it is?"       N/a 11. OTHER SYMPTOMS: "Do you have any other symptoms?" (e.g., fever)       Redness 12. PREGNANCY: "Is there any chance you are pregnant?" "When was your last menstrual period?"       No  Protocols used: Skin Lesion - Moles or Growths-A-AH

## 2022-03-16 NOTE — Telephone Encounter (Signed)
On schedule at 2:40

## 2022-05-05 ENCOUNTER — Other Ambulatory Visit: Payer: Self-pay | Admitting: Family Medicine

## 2022-05-05 DIAGNOSIS — N838 Other noninflammatory disorders of ovary, fallopian tube and broad ligament: Secondary | ICD-10-CM

## 2022-05-05 NOTE — Telephone Encounter (Signed)
Requested medication (s) are due for refill today: Yes  Requested medication (s) are on the active medication list: Yes  Last refill:  10/05/21  Future visit scheduled: No  Notes to clinic: Last filled by Dr. Glennon Mac.    Requested Prescriptions  Pending Prescriptions Disp Refills   omeprazole (PRILOSEC) 20 MG capsule [Pharmacy Med Name: OMEPRAZOLE DR 20 MG CAPSULE] 90 capsule 1    Sig: TAKE 1 CAPSULE BY MOUTH EVERY DAY     Gastroenterology: Proton Pump Inhibitors Passed - 05/05/2022  1:29 AM      Passed - Valid encounter within last 12 months    Recent Outpatient Visits           1 month ago Wound infection   [place], [place], DO   1 month ago Neoplasm of uncertain behavior of skin   [place], [place], DO   2 months ago Routine general medical examination at a health care facility   [facility], [place], DO   5 months ago Generalized anxiety disorder   [place], DO   9 months ago Mixed hyperlipidemia   [place], [place], DO

## 2022-05-08 ENCOUNTER — Other Ambulatory Visit: Payer: Self-pay | Admitting: Family Medicine

## 2022-05-10 NOTE — Telephone Encounter (Signed)
Requested medication (s) are due for refill today: yes  Requested medication (s) are on the active medication list: yes  Last refill:  02/08/22  Future visit scheduled: no  Notes to clinic:  Unable to refill per protocol, cannot delegate.      Requested Prescriptions  Pending Prescriptions Disp Refills   ALPRAZolam (XANAX) 0.5 MG tablet [Pharmacy Med Name: ALPRAZOLAM 0.5 MG TABLET] 45 tablet 1    Sig: TAKE 1 TABLET BY MOUTH 2 TIMES DAILY AS NEEDED FOR ANXIETY.     Not Delegated - Psychiatry: Anxiolytics/Hypnotics 2 Failed - 05/08/2022  8:25 PM      Failed - This refill cannot be delegated      Failed - Urine Drug Screen completed in last 360 days      Passed - Patient is not pregnant      Passed - Valid encounter within last 6 months    Recent Outpatient Visits           1 month ago Wound infection   [place], [place], DO   2 months ago Neoplasm of uncertain behavior of skin   [place], DO   3 months ago Routine general medical examination at a health care facility   [facility], [place], DO   6 months ago Generalized anxiety disorder   [place], DO   9 months ago Mixed hyperlipidemia   [place], [place], DO

## 2022-05-11 NOTE — Telephone Encounter (Signed)
Needs appt- OK to do virtual

## 2022-05-12 NOTE — Telephone Encounter (Signed)
Pt has made an appointment on 05/10/2022 @ 9:02 for 05/29/2022 @ 1:40 with Dr. Wynetta Emery.

## 2022-05-17 ENCOUNTER — Ambulatory Visit (INDEPENDENT_AMBULATORY_CARE_PROVIDER_SITE_OTHER): Payer: Medicare HMO

## 2022-05-17 ENCOUNTER — Encounter (INDEPENDENT_AMBULATORY_CARE_PROVIDER_SITE_OTHER): Payer: Self-pay | Admitting: Nurse Practitioner

## 2022-05-17 ENCOUNTER — Ambulatory Visit (INDEPENDENT_AMBULATORY_CARE_PROVIDER_SITE_OTHER): Payer: Medicare HMO | Admitting: Nurse Practitioner

## 2022-05-17 VITALS — BP 107/63 | HR 85 | Resp 16 | Wt 141.2 lb

## 2022-05-17 DIAGNOSIS — I70212 Atherosclerosis of native arteries of extremities with intermittent claudication, left leg: Secondary | ICD-10-CM

## 2022-05-17 DIAGNOSIS — E782 Mixed hyperlipidemia: Secondary | ICD-10-CM

## 2022-05-17 DIAGNOSIS — I714 Abdominal aortic aneurysm, without rupture, unspecified: Secondary | ICD-10-CM

## 2022-05-17 NOTE — Progress Notes (Signed)
Subjective:    Patient ID: Maureen [name], female    DOB: 07-16-1945, 77 y.o.   MRN: [id number] Chief Complaint  Patient presents with   Follow-up    Ultrasound follow up    The patient returns to the office for surveillance of a known abdominal aortic aneurysm. Patient denies abdominal pain or back pain, no other abdominal complaints. No changes suggesting embolic episodes.   There have been no interval changes in the patient's overall health care since his last visit.  Patient denies amaurosis fugax or TIA symptoms. There is no history of claudication or rest pain symptoms of the lower extremities. The patient denies angina or shortness of breath.   Patient continues to smoke  Duplex US of the aorta and iliac arteries shows an AAA measured 4.72 x 4.96 cm.  The previous scan 11/14/2021 measured 4.35 x 4.13    Review of Systems  Cardiovascular:  Negative for chest pain.  All other systems reviewed and are negative.      Objective:   Physical Exam Vitals reviewed.  HENT:     Head: Normocephalic.  Cardiovascular:     Rate and Rhythm: Normal rate.     Pulses:          Radial pulses are 2+ on the right side and 2+ on the left side.       Dorsalis pedis pulses are 1+ on the right side and 1+ on the left side.  Pulmonary:     Effort: Pulmonary effort is normal.  Skin:    General: Skin is warm and dry.  Neurological:     Mental Status: She is alert and oriented to person, place, and time.  Psychiatric:        Mood and Affect: Mood normal.        Behavior: Behavior normal.        Thought Content: Thought content normal.        Judgment: Judgment normal.     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 This was a histologic grade 2 tumor measuring just under 1 cm in diameter. No invasive cancer was identified. ER 90%, PR 40%.   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[facility])    GERD (gastroesophageal reflux disease)    Heart murmur 2005   Hypercholesterolemia 2012   Mammographic microcalcification 2011   Panic attack    Personal history of chemotherapy 1994   Personal history of colonic polyps    Colon polyps   Personal history of other malignant neoplasm of skin 2015   basal cell   Personal history of tobacco use, presenting hazards to health    PUD (peptic ulcer disease)    Last EGD 1/09   Pulmonary nodule  Rectal polyp 2008   villous adenoma; last colonoscopy 1/09 WNL   Skin cancer, basal cell 2015   right cheek Mohs surgery at [place] use    Ulcer     Social History   Socioeconomic History   Marital status: Widowed    Spouse name: Not on file   Number of children: Not on file   Years of education: Not on file   Highest education level: Not on file  Occupational History   Occupation: Optician, 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 She has two adult daughters. She lives in her home. Her youngest daughter and grandchildren live with her. She has two dogs. 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 She loves watching her grandson play basketball. She also loves gardening and being outdoors and working in the yard.    Social Determinants of Health   Financial Resource Strain: Not on file  Food Insecurity: Not on file  Transportation Needs: Not on file  Physical Activity: Not on file  Stress: Not on file  Social Connections: Not on file  Intimate Partner Violence: Not on file    Past Surgical History:  Procedure Laterality Date   APPENDECTOMY  2011   BREAST BIOPSY  12/27/2009   right breast   CARDIAC CATHETERIZATION  1996   Negative Cath   COLONOSCOPY N/A 08/05/2014   Procedure: COLONOSCOPY;  Surgeon: [name], MD;  Location: [facility] ENDOSCOPY;  EGD, tubular adenoma 1.   COLONOSCOPY W/ BIOPSIES  [id number]   Dr. Bary Castilla. Normal exam 2009. Villous adenoma of the rectum in 2005 adenomatous polyp from the ascending colon and rectum in 2002.   ESOPHAGOGASTRODUODENOSCOPY N/A 08/05/2014   Procedure: ESOPHAGOGASTRODUODENOSCOPY (EGD);  Surgeon: [name], MD;  Location: [facility] ENDOSCOPY;  Service: Endoscopy;  Laterality: N/A;   ESOPHAGOGASTRODUODENOSCOPY (EGD) WITH PROPOFOL N/A 07/25/2017   Procedure: ESOPHAGOGASTRODUODENOSCOPY (EGD) WITH PROPOFOL;  Surgeon: [name], MD;  Location: [facility] ENDOSCOPY;  Service: Endoscopy;  Laterality: N/A;   ESOPHAGOGASTRODUODENOSCOPY (EGD) WITH PROPOFOL N/A 08/03/2021   Procedure: ESOPHAGOGASTRODUODENOSCOPY (EGD) WITH PROPOFOL;  Surgeon: [name], MD;  Location: [facility] ENDOSCOPY;  Service: Endoscopy;  Laterality: N/A;   LAPAROSCOPIC BILATERAL SALPINGO OOPHERECTOMY Bilateral 10/05/2021   Procedure: LAPAROSCOPIC  BILATERAL SALPINGO OOPHORECTOMY, REMOVAL OF PELVIC MASSES, MINI LAPAROTOMY;  Surgeon: Mellody Drown, MD;  Location: [facility] ORS;  Service: Gynecology;  Laterality: Bilateral;   MASTECTOMY     bilateral   SIMPLE MASTECTOMY  1993   right   SIMPLE MASTECTOMY  2011   left   TONSILLECTOMY  1997   UPPER GI ENDOSCOPY  2003, 2005, 2009, 2012, 2014   2012 showed mild reflux changes without evidence  of Barrett's epithelial changes 2014 biopsies at 37 cm showed changes suggestive of reflux esophagitis. No metaplasia.   VAGINAL HYSTERECTOMY  1977    Family History  Problem Relation Age of Onset   Cancer Father        esophagus   Cancer Maternal Aunt        breast cancer   Cancer Maternal Grandmother        breast cancer    Allergies  Allergen Reactions   Doxycycline Rash   Codeine Itching   Incruse Ellipta [Umeclidinium Bromide] Other (See Comments)    Oral sores   Sulfa Antibiotics Hives   Sulfinpyrazone    Erythromycin Rash   Penicillins Rash       Latest Ref Rng & Units 02/08/2022   10:40 AM 11/24/2021   11:17 AM 09/28/2021    9:13 AM  CBC  WBC 3.4 - 10.8 x10E3/uL 7.4  6.8  6.2   Hemoglobin 11.1 - 15.9 g/dL 13.2  13.0  13.2   Hematocrit 34.0 - 46.6 % 40.4  39.1  40.0   Platelets 150 - 450 x10E3/uL 204  221  193       CMP     Component Value Date/Time   NA 142 02/08/2022 1040   K 4.3 02/08/2022 1040   CL 105 02/08/2022 1040   CO2 18 (L) 02/08/2022 1040   GLUCOSE 90 02/08/2022 1040   GLUCOSE 101 (H) 11/24/2021 1117   BUN 17 02/08/2022 1040   CREATININE 1.20 (H) 02/08/2022 1040   CALCIUM 10.2 02/08/2022 1040   PROT 6.7 02/08/2022 1040   ALBUMIN 4.4 02/08/2022 1040   AST 17 02/08/2022 1040   ALT 20 02/08/2022 1040   ALKPHOS 131 (H) 02/08/2022 1040   BILITOT 0.2 02/08/2022 1040   GFRNONAA 55 (L) 11/24/2021 1117   GFRAA 63 02/03/2020 0859     No results found.     Assessment & Plan:   1. Abdominal aortic aneurysm (AAA) without rupture, unspecified part  ([place]) Recommend:  The patient has an abdominal aortic aneurysm that is nearly 5.0 cm or greater by duplex scan and based on this study it appears that it is suitable for endovascular treatment.  There is also concern that the patient has had greater than 0.5 cm growth in 6 months.  The patient is otherwise in reasonable health.   Therefore, the patient should undergo endovascular repair of the AAA to prevent future leathal rupture.   Patient will require CT angiography of the abdomen and pelvis in order to appropriately plan repair of the AAA.  The risks and benefits as well as the alternative therapies was discussed in detail with the patient. All questions were answered. The patient agrees to move forward the AAA repair.  Therefore a CT angiogram with be scheduled as an outpatient.  The patient will follow up with me in the office after the CT scan to review the study and finalize plan for repair.  The patient will also likely require cardiac clearance.  Will refer the patient to cardiology as well to help expedite the process.  2. Mixed hyperlipidemia Continue statin as ordered and reviewed, no changes at this time  3. Gastroesophageal reflux disease, unspecified whether esophagitis present Continue PPI as already ordered, this medication has been reviewed and there are no changes at this time.  Avoidence of caffeine and alcohol  Moderate elevation of the head of the bed    Current Outpatient Medications on File Prior to Visit  Medication  Sig Dispense Refill   albuterol (PROVENTIL) (2.5 MG/3ML) 0.083% nebulizer solution Take 3 mLs (2.5 mg total) by nebulization every 6 (six) hours as needed for wheezing or shortness of breath. 150 mL 1   albuterol (VENTOLIN HFA) 108 (90 Base) MCG/ACT inhaler Inhale 1-2 puffs into the lungs every 6 (six) hours as needed for wheezing or shortness of breath. 18 each 6   Ascorbic Acid (VITAMIN C) 100 MG tablet Take 100 mg by mouth daily.      budesonide-formoterol (SYMBICORT) 160-4.5 MCG/ACT inhaler Inhale 2 puffs into the lungs 2 (two) times daily. 3 each 3   Calcium-Vitamins C & D (CALCIUM/C/D PO) Take 1 tablet by mouth daily.     cetirizine (ZYRTEC ALLERGY) 10 MG tablet Take 10 mg by mouth every morning.     Cholecalciferol (VITAMIN D3) 50 MCG (2000 UT) TABS Take 1 tablet by mouth daily at 6 (six) AM.     fluticasone (FLONASE) 50 MCG/ACT nasal spray Place 2 sprays into the nose every morning.     hydrocortisone (ANUSOL-HC) 25 MG suppository Place 25 mg rectally 2 (two) times daily.     ketoconazole (NIZORAL) 2 % cream Apply 1 Application topically daily.     KRILL OIL PO Take by mouth daily.     montelukast (SINGULAIR) 10 MG tablet TAKE 1 TABLET BY MOUTH EVERYDAY AT BEDTIME 90 tablet 1   mupirocin ointment (BACTROBAN) 2 % Apply 1 Application topically 2 (two) times daily. 22 g 0   omeprazole (PRILOSEC) 20 MG capsule TAKE 1 CAPSULE BY MOUTH EVERY DAY 90 capsule 1   vancomycin (VANCOCIN) 125 MG capsule Take 125 mg by mouth 4 (four) times daily.     Vitamin D, Ergocalciferol, (DRISDOL) 1.25 MG (50000 UNIT) CAPS capsule Take 1 capsule (50,000 Units total) by mouth every 7 (seven) days. 12 capsule 1   vitamin E 100 UNIT capsule Take 100 Units by mouth daily.     zinc gluconate 50 MG tablet Take 50 mg by mouth as needed.     No current facility-administered medications on file prior to visit.    There are no Patient Instructions on file for this visit. No follow-ups on file.   Kris Hartmann, NP

## 2022-05-19 ENCOUNTER — Other Ambulatory Visit: Payer: Self-pay

## 2022-05-19 NOTE — Telephone Encounter (Signed)
Pt called about her refill for ALPRAZolam Duanne Moron) 0.5 MG tablet / pt has scheduled appt for 3.11.24/ please advise / pt has 2 tabs left

## 2022-05-21 MED ORDER — ALPRAZOLAM 0.5 MG PO TABS: 0.5000 mg | ORAL_TABLET | Freq: Two times a day (BID) | ORAL | 0 refills | Status: DC | PRN

## 2022-05-22 ENCOUNTER — Encounter (INDEPENDENT_AMBULATORY_CARE_PROVIDER_SITE_OTHER): Payer: Self-pay | Admitting: Nurse Practitioner

## 2022-05-22 IMAGING — PT NM PET TUM IMG INITIAL (PI) SKULL BASE T - THIGH
1 of 7 series · 1 of 25 positions shown · non-contrast
Comparison: CT chest Lung cancer screening 04/27/2021
COMPARISON: CT chest Lung cancer screening 04/27/2021

Addendum:
CLINICAL DATA: Initial treatment strategy for lung nodule.

EXAM:
NUCLEAR MEDICINE PET SKULL BASE TO THIGH
TECHNIQUE: 7.41 mCi F-18 FDG was injected intravenously. Full-ring PET imaging
was performed from the skull base to thigh after the radiotracer. CT
data was obtained and used for attenuation correction and anatomic
localization.
Fasting blood glucose: 97 mg/dl

[Series 4: ct sk_thigh 5.0 br38 · axial · 5.0mm · 0.98mm/px · 1 of 221 slices shown]
[im 221/221  brain]
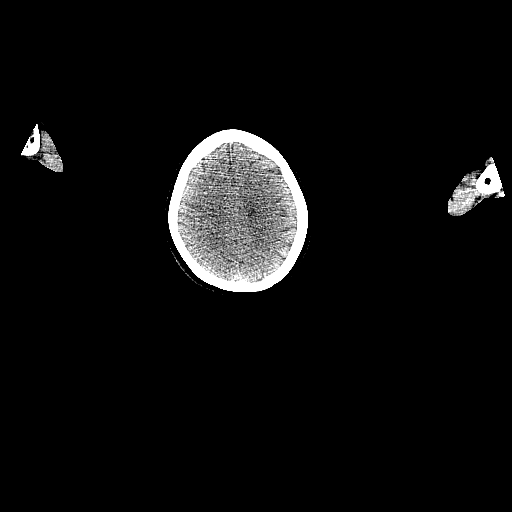

[1 of 25 positions shown; findings below may reference images not displayed]

FINDINGS: Mediastinal blood pool activity: SUV max

Liver activity: SUV max NA

NECK: No hypermetabolic lymph nodes in the neck.

Incidental CT findings: none

CHEST: No tracer avid lymph nodes identified. Focal area of mild
wall thickening involving the distal esophagus exhibits mild FDG
uptake with SUV max of 3.93, image 101/4.

-The small tiny nodule in the left upper lobe is again noted. This
measures 5 mm and has an SUV max of 1.97, image [DATE].

-The small part solid nodule within the medial right upper lobe is
difficult to visualize on today's study without FDG uptake within
the location noted on the previous exam.

-New tiny solid nodule within the right upper lobe measures 4 mm,
image [DATE] and has an SUV max of 0.99.

-Also new is a small cluster of nodules within the apical segment of
the left upper lobe. The largest nodule measures 4 mm and has an SUV
max 1.08, image [DATE].

-Also new is a nodule within the periphery of the left upper lobe
measuring 4 mm with SUV max 0.53.

-New sub solid appearing nodule within the subpleural aspect of the
posterior right upper lobe measures 6 mm with SUV max of 0.83, image
32/7.

Incidental CT findings: Centrilobular emphysema. Aortic
atherosclerosis and coronary artery calcifications. Small hiatal
hernia.

ABDOMEN/PELVIS: No abnormal hypermetabolic activity within the
liver, pancreas, adrenal glands, or spleen. No hypermetabolic lymph
nodes in the abdomen or pelvis.

Incidental CT findings:

Unchanged appearance of bilateral low-attenuation adrenal nodules
compared with 09/12/2018 compatible with benign adenomas. Aortic
atherosclerosis. Infrarenal abdominal aortic aneurysm is identified
measuring 4.6 cm, image 132/4.

Within the right adnexa there is a solid-appearing mass measuring
5.4 x 5.2 cm with SUV max 2.44. Within the left adnexa there is a
solid-appearing mass measuring 6.5 x 6.9 cm with SUV max of 2.6,
image 172/4. The patient is status post hysterectomy

SKELETON: No focal hypermetabolic activity to suggest skeletal
metastasis.

Incidental CT findings: none
IMPRESSION: 1. Bilateral pulmonary nodules are again noted which are all
technically too small to reliably characterize by PET-CT.
2. The previously characterized new solid nodule in the left upper
lobe measuring 5.8 mm has an SUV max of 1.97. Differential
considerations for this degree of uptake include
inflammatory/infectious change versus early malignancy.
3. The previously characterized new part solid nodule within the
right upper lobe is difficult to visualize on today's study and may
have resolved in the interval.
4. Several new small nodules are identified within the right upper
lobe, right lower lobe and left upper lobe which are nonspecific and
exhibit mild SUV max (less than 1.0).
5. Recommend repeat Lung cancer screening CT in 3 months to assess
for temporal change in the appearance of these lung nodules which
are likely too small to successfully biopsy.
6. No signs of FDG avid nodal metastasis or distant metastatic
disease.
7. Increased uptake within the distal esophagus is noted,
nonspecific and may be related to esophagitis.
8. Infrarenal abdominal aortic aneurysm measures 4.6 cm. Recommend
follow-up CT/MR every 6 months and vascular consultation. This
recommendation follows ACR consensus guidelines: White Paper of the
ACR Incidental Findings Committee II on Vascular Findings. [HOSPITAL] 9574; [DATE].
9. Aortic Atherosclerosis (5UC5Y-HRZ.Z) and Emphysema (5UC5Y-GMI.9).
Coronary artery calcifications.

THE FOLLOWING IS AN ADD-ON TO THE IMPRESSION PORTION OF THE EXAM.:
THE FOLLOWING IS AN ADD-ON TO THE IMPRESSION PORTION OF THE EXAM.

10. There are bilateral indeterminate solid-appearing adnexal
masses. These are indeterminate. There is no mention of this finding
on remote pelvic ultrasound from 11/17/2008. Initial evaluation with
pelvic sonogram is recommended. Alternatively contrast enhanced
pelvic MRI may provide a more definitive characterization.

*** End of Addendum ***
FINDINGS: Mediastinal blood pool activity: SUV max

Liver activity: SUV max NA

NECK: No hypermetabolic lymph nodes in the neck.

Incidental CT findings: none

CHEST: No tracer avid lymph nodes identified. Focal area of mild
wall thickening involving the distal esophagus exhibits mild FDG
uptake with SUV max of 3.93, image 101/4.

-The small tiny nodule in the left upper lobe is again noted. This
measures 5 mm and has an SUV max of 1.97, image [DATE].

-The small part solid nodule within the medial right upper lobe is
difficult to visualize on today's study without FDG uptake within
the location noted on the previous exam.

-New tiny solid nodule within the right upper lobe measures 4 mm,
image [DATE] and has an SUV max of 0.99.

-Also new is a small cluster of nodules within the apical segment of
the left upper lobe. The largest nodule measures 4 mm and has an SUV
max 1.08, image [DATE].

-Also new is a nodule within the periphery of the left upper lobe
measuring 4 mm with SUV max 0.53.

-New sub solid appearing nodule within the subpleural aspect of the
posterior right upper lobe measures 6 mm with SUV max of 0.83, image
32/7.

Incidental CT findings: Centrilobular emphysema. Aortic
atherosclerosis and coronary artery calcifications. Small hiatal
hernia.

ABDOMEN/PELVIS: No abnormal hypermetabolic activity within the
liver, pancreas, adrenal glands, or spleen. No hypermetabolic lymph
nodes in the abdomen or pelvis.

Incidental CT findings:

Unchanged appearance of bilateral low-attenuation adrenal nodules
compared with 09/12/2018 compatible with benign adenomas. Aortic
atherosclerosis. Infrarenal abdominal aortic aneurysm is identified
measuring 4.6 cm, image 132/4.

Within the right adnexa there is a solid-appearing mass measuring
5.4 x 5.2 cm with SUV max 2.44. Within the left adnexa there is a
solid-appearing mass measuring 6.5 x 6.9 cm with SUV max of 2.6,
image 172/4. The patient is status post hysterectomy

SKELETON: No focal hypermetabolic activity to suggest skeletal
metastasis.

Incidental CT findings: none
IMPRESSION: 1. Bilateral pulmonary nodules are again noted which are all
technically too small to reliably characterize by PET-CT.
2. The previously characterized new solid nodule in the left upper
lobe measuring 5.8 mm has an SUV max of 1.97. Differential
considerations for this degree of uptake include
inflammatory/infectious change versus early malignancy.
3. The previously characterized new part solid nodule within the
right upper lobe is difficult to visualize on today's study and may
have resolved in the interval.
4. Several new small nodules are identified within the right upper
lobe, right lower lobe and left upper lobe which are nonspecific and
exhibit mild SUV max (less than 1.0).
5. Recommend repeat Lung cancer screening CT in 3 months to assess
for temporal change in the appearance of these lung nodules which
are likely too small to successfully biopsy.
6. No signs of FDG avid nodal metastasis or distant metastatic
disease.
7. Increased uptake within the distal esophagus is noted,
nonspecific and may be related to esophagitis.
8. Infrarenal abdominal aortic aneurysm measures 4.6 cm. Recommend
follow-up CT/MR every 6 months and vascular consultation. This
recommendation follows ACR consensus guidelines: White Paper of the
ACR Incidental Findings Committee II on Vascular Findings. [HOSPITAL] 9574; [DATE].
9. Aortic Atherosclerosis (5UC5Y-HRZ.Z) and Emphysema (5UC5Y-GMI.9).
Coronary artery calcifications.

## 2022-05-22 NOTE — Progress Notes (Signed)
Referring-Maureen Johnson, DO Reason for referral-abdominal aortic aneurysm and preoperative evaluation  HPI: 77 year old female for evaluation of abdominal aortic aneurysm and preoperative evaluation at the request of Park Liter, DO.  Calcium score November 2016 0.  ABIs August 2023 mild on the left.  Abdominal ultrasound February 2024 showed 5 cm abdominal aortic aneurysm which had increased in size from study dated November 14, 2021 of 4.3 cm.  Patient seen by vascular surgery and cardiology asked to evaluate preoperatively prior to endovascular treatment.  Patient has not very physically active.  She does not exercise or walk routinely.  She does have dyspnea on exertion but no orthopnea, PND, pedal edema, chest pain or syncope.  Cardiology now asked to evaluate.  Current Outpatient Medications  Medication Sig Dispense Refill   albuterol (PROVENTIL) (2.5 MG/3ML) 0.083% nebulizer solution Take 3 mLs (2.5 mg total) by nebulization every 6 (six) hours as needed for wheezing or shortness of breath. 150 mL 1   albuterol (VENTOLIN HFA) 108 (90 Base) MCG/ACT inhaler Inhale 1-2 puffs into the lungs every 6 (six) hours as needed for wheezing or shortness of breath. 18 each 6   ALPRAZolam (XANAX) 0.5 MG tablet Take 1 tablet (0.5 mg total) by mouth 2 (two) times daily as needed for anxiety. 30 tablet 0   Ascorbic Acid (VITAMIN C) 100 MG tablet Take 100 mg by mouth daily.     budesonide-formoterol (SYMBICORT) 160-4.5 MCG/ACT inhaler Inhale 2 puffs into the lungs 2 (two) times daily. 3 each 3   Calcium-Vitamins C & D (CALCIUM/C/D PO) Take 1 tablet by mouth daily.     cetirizine (ZYRTEC ALLERGY) 10 MG tablet Take 10 mg by mouth every morning.     Cholecalciferol (VITAMIN D3) 50 MCG (2000 UT) TABS Take 1 tablet by mouth daily at 6 (six) AM.     fluticasone (FLONASE) 50 MCG/ACT nasal spray Place 2 sprays into the nose every morning.     hydrocortisone (ANUSOL-HC) 25 MG suppository Place 25 mg rectally 2  (two) times daily.     ketoconazole (NIZORAL) 2 % cream Apply 1 Application topically daily.     KRILL OIL PO Take by mouth daily.     montelukast (SINGULAIR) 10 MG tablet TAKE 1 TABLET BY MOUTH EVERYDAY AT BEDTIME 90 tablet 1   mupirocin ointment (BACTROBAN) 2 % Apply 1 Application topically 2 (two) times daily. 22 g 0   omeprazole (PRILOSEC) 20 MG capsule TAKE 1 CAPSULE BY MOUTH EVERY DAY 90 capsule 1   vancomycin (VANCOCIN) 125 MG capsule Take 125 mg by mouth 4 (four) times daily.     Vitamin D, Ergocalciferol, (DRISDOL) 1.25 MG (50000 UNIT) CAPS capsule Take 1 capsule (50,000 Units total) by mouth every 7 (seven) days. 12 capsule 1   vitamin E 100 UNIT capsule Take 100 Units by mouth daily.     zinc gluconate 50 MG tablet Take 50 mg by mouth as needed.     No current facility-administered medications for this visit.    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 This was a histologic grade 2 tumor measuring just under 1 cm in diameter. No invasive cancer was identified. ER 90%, PR 40%.   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   Bronchitis    COPD (chronic obstructive pulmonary disease) ([place])    Discoid lupus 2005   Emphysematous bleb (HCC)    GERD (gastroesophageal reflux disease)    Heart murmur 2005   Hypercholesterolemia 2012   Lung cancer [facility])    Mammographic  microcalcification 2011   Panic attack    Personal history of chemotherapy 1994   Personal history of colonic polyps    Colon polyps   Personal history of other malignant neoplasm of skin 2015   basal cell   Personal history of tobacco use, presenting hazards to health    PUD (peptic ulcer disease)    Last EGD 1/09   Pulmonary nodule    Rectal polyp 2008   villous adenoma; last colonoscopy 1/09 WNL   Skin cancer, basal cell 2015   right cheek Mohs surgery at [place] use    Ulcer     Past Surgical History:  Procedure Laterality Date   APPENDECTOMY  2011   BREAST BIOPSY  12/27/2009   right breast   CARDIAC CATHETERIZATION  1996   Negative Cath   COLONOSCOPY N/A 08/05/2014   Procedure: COLONOSCOPY;  Surgeon: [name], MD;  Location: [facility] ENDOSCOPY;  EGD, tubular adenoma 1.   COLONOSCOPY W/ BIOPSIES  2005,2009   Dr. Bary Castilla. Normal exam 2009. Villous adenoma of the rectum in 2005 adenomatous polyp from the ascending colon and rectum in 2002.   ESOPHAGOGASTRODUODENOSCOPY N/A 08/05/2014   Procedure: ESOPHAGOGASTRODUODENOSCOPY (EGD);  Surgeon: [name], MD;  Location: [facility] ENDOSCOPY;  Service: Endoscopy;  Laterality: N/A;   ESOPHAGOGASTRODUODENOSCOPY (EGD) WITH PROPOFOL N/A 07/25/2017   Procedure: ESOPHAGOGASTRODUODENOSCOPY (EGD) WITH PROPOFOL;  Surgeon: [name], MD;  Location: [facility] ENDOSCOPY;  Service: Endoscopy;  Laterality: N/A;   ESOPHAGOGASTRODUODENOSCOPY (EGD) WITH PROPOFOL N/A 08/03/2021   Procedure: ESOPHAGOGASTRODUODENOSCOPY (EGD) WITH PROPOFOL;  Surgeon: [name], MD;  Location: [facility] ENDOSCOPY;  Service: Endoscopy;  Laterality: N/A;   LAPAROSCOPIC BILATERAL SALPINGO OOPHERECTOMY Bilateral 10/05/2021   Procedure: LAPAROSCOPIC BILATERAL SALPINGO OOPHORECTOMY, REMOVAL OF PELVIC MASSES, MINI LAPAROTOMY;  Surgeon: Mellody Drown, MD;  Location: [facility] ORS;  Service: Gynecology;  Laterality: Bilateral;   MASTECTOMY     bilateral    SIMPLE MASTECTOMY  1993   right   SIMPLE MASTECTOMY  2011   left   TONSILLECTOMY  1997   UPPER GI ENDOSCOPY  2003, 2005, 2009, 2012, 2014   2012 showed mild reflux changes without evidence of Barrett's epithelial changes 2014 biopsies at 37 cm showed changes suggestive of reflux esophagitis. No metaplasia.   VAGINAL HYSTERECTOMY  1977    Social History   Socioeconomic History   Marital status: Widowed    Spouse name: Not on file   Number of children: 2   Years of education: Not on file   Highest education level: Not on file  Occupational History   Occupation: Physicist, medical    Employer: [employer]  Tobacco Use   Smoking status: Every Day  Packs/day: 0.50    Years: 40.00    Total pack years: 20.00    Types: Cigarettes   Smokeless tobacco: Never   Tobacco comments:    Pt wants to do hypnotizing  Vaping Use   Vaping Use: Never used  Substance and Sexual Activity   Alcohol use: Yes    Alcohol/week: 1.0 standard drink of alcohol    Types: 1 Cans of beer per week    Comment: 1 beer weekly   Drug use: No   Sexual activity: Not Currently  Other Topics Concern   Not on file  Social History Narrative   Danie grew up in [place]. She has two adult daughters. She lives in her home. Her youngest daughter and grandchildren live with her. She has two dogs. 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 She loves watching her grandson play basketball. She also loves gardening and being outdoors and working in the yard.    Social Determinants of Health   Financial Resource Strain: Not on file  Food Insecurity: Not on file  Transportation Needs: Not on file  Physical Activity: Not on file  Stress: Not on file  Social Connections: Not on file  Intimate Partner Violence: Not on file    Family History  Problem Relation Age of Onset   Heart attack Father    Cancer Father        esophagus    Cancer Maternal Grandmother        breast cancer   Cancer Maternal Aunt        breast cancer    ROS: no fevers or chills, productive cough, hemoptysis, dysphasia, odynophagia, melena, hematochezia, dysuria, hematuria, rash, seizure activity, orthopnea, PND, pedal edema, claudication. Remaining systems are negative.  Physical Exam:   Blood pressure 122/72, pulse 91, height '5\' 3"'$  (1.6 m), weight 144 lb 3.2 oz (65.4 kg), SpO2 97 %.  General:  Well developed/well nourished in NAD Skin warm/dry Patient not depressed No peripheral clubbing Back-normal HEENT-normal/normal eyelids Neck supple/normal carotid upstroke bilaterally; no bruits; no JVD; no thyromegaly chest - CTA/ normal expansion CV - RRR/normal S1 and S2; no murmurs, rubs or gallops;  PMI nondisplaced Abdomen -NT/ND, no HSM, no mass, + bowel sounds, no bruit 2+ femoral pulses, no bruits Ext-no edema, chords, 2+ DP Neuro-grossly nonfocal  ECG -normal sinus rhythm, incomplete right bundle branch block with anterior T wave inversion unchanged compared to July 2023.  Personally reviewed  A/P  1 preoperative evaluation prior to endovascular repair of abdominal aortic aneurysm-patient has risk factors including hyperlipidemia, tobacco abuse and family history.  She has limited functional capacity.  I will arrange a [place] nuclear study to screen for ischemia.  2 abdominal aortic aneurysm-patient has been seen by vascular surgery and given size as well as growth of aneurysm plan is to proceed with endovascular repair.  3 hyperlipidemia-patient apparently has had difficulties with a statin in the past but does not know the name.  I will try Crestor 10 mg daily.  Check lipids and liver in 8 weeks.  If she does not tolerate we will consider referral for PCSK9 inhibitor.  4 tobacco abuse-patient counseled on discontinuing.  Kirk Ruths, MD

## 2022-05-29 ENCOUNTER — Ambulatory Visit: Payer: Medicare HMO | Attending: Cardiology | Admitting: Cardiology

## 2022-05-29 ENCOUNTER — Encounter: Payer: Self-pay | Admitting: Family Medicine

## 2022-05-29 ENCOUNTER — Ambulatory Visit: Payer: Medicare HMO | Admitting: Family Medicine

## 2022-05-29 ENCOUNTER — Encounter: Payer: Self-pay | Admitting: Cardiology

## 2022-05-29 VITALS — BP 122/72 | HR 91 | Ht 63.0 in | Wt 144.2 lb

## 2022-05-29 VITALS — BP 125/75 | HR 94 | Temp 98.1°F | Ht 63.0 in | Wt 145.1 lb

## 2022-05-29 DIAGNOSIS — E782 Mixed hyperlipidemia: Secondary | ICD-10-CM

## 2022-05-29 DIAGNOSIS — Z85828 Personal history of other malignant neoplasm of skin: Secondary | ICD-10-CM

## 2022-05-29 DIAGNOSIS — I714 Abdominal aortic aneurysm, without rupture, unspecified: Secondary | ICD-10-CM | POA: Diagnosis not present

## 2022-05-29 DIAGNOSIS — C44529 Squamous cell carcinoma of skin of other part of trunk: Secondary | ICD-10-CM

## 2022-05-29 DIAGNOSIS — Z01810 Encounter for preprocedural cardiovascular examination: Secondary | ICD-10-CM

## 2022-05-29 DIAGNOSIS — I7142 Juxtarenal abdominal aortic aneurysm, without rupture: Secondary | ICD-10-CM | POA: Diagnosis not present

## 2022-05-29 DIAGNOSIS — F411 Generalized anxiety disorder: Secondary | ICD-10-CM | POA: Diagnosis not present

## 2022-05-29 DIAGNOSIS — Z0000 Encounter for general adult medical examination without abnormal findings: Secondary | ICD-10-CM | POA: Diagnosis not present

## 2022-05-29 DIAGNOSIS — Z01818 Encounter for other preprocedural examination: Secondary | ICD-10-CM

## 2022-05-29 DIAGNOSIS — Z7189 Other specified counseling: Secondary | ICD-10-CM | POA: Diagnosis not present

## 2022-05-29 LAB — COAGUCHEK XS/INR WAIVED
INR: 1 (ref 0.9–1.1)
Prothrombin Time: 12 s

## 2022-05-29 MED ORDER — ROSUVASTATIN CALCIUM 10 MG PO TABS: 10.0000 mg | ORAL_TABLET | Freq: Every day | ORAL | 3 refills | Status: DC

## 2022-05-29 NOTE — Patient Instructions (Signed)
[facility] [place],  [place]  [address] [address]: [phone]  Preventative Services:  AAA screening: Up to date Health Risk Assessment and Personalized Prevention Plan: Done today Bone Mass Measurements: up to date Breast Cancer Screening: N/A CVD Screening: up to date Cervical Cancer Screening: N/A Colon Cancer Screening: N/A Depression Screening: Done today Diabetes Screening: Done today Glaucoma Screening: See your eye doctor Hepatitis B vaccine: N/A Hepatitis C screening: up to date HIV Screening: up to date Flu Vaccine: up to date Lung cancer Screening: Scheduled Obesity Screening: Done today Pneumonia Vaccines (2): up to date STI Screening: N/A

## 2022-05-29 NOTE — Patient Instructions (Signed)
Medication Instructions:   START ROSUVASTATIN 10 MG ONCE DAILY  *If you need a refill on your cardiac medications before your next appointment, please call your pharmacy*   Lab Work:  Your physician recommends that you return for lab work in: [place]  If you have labs (blood work) drawn today and your tests are completely normal, you will receive your results only by: [place] (if you have MyChart) OR A paper copy in the mail If you have any lab test that is abnormal or we need to change your treatment, we will call you to review the results.   Testing/Procedures:  Your physician has requested that you have a lexiscan myoview. For further information please visit [email or web address]. Please follow instruction sheet, as given. [place], you and your health needs are our priority.  As part of our continuing mission to provide you with exceptional heart care, we have created designated Provider Care Teams.  These Care Teams include your primary Cardiologist (physician) and Advanced Practice Providers (APPs -  Physician Assistants and Nurse Practitioners) who all work together to provide you with the care you need, when you need it.  We recommend signing up for the patient portal called "MyChart".  Sign up information is provided on this After Visit Summary.  MyChart is used to connect with patients for Virtual Visits (Telemedicine).  Patients are able to view lab/test results, encounter notes, upcoming appointments, etc.  Non-urgent messages can be sent to your provider as well.   To learn more about what you can do with MyChart, go to [email or web address].    Your next appointment:   12 month(s)  Provider:   Kirk Ruths MD

## 2022-05-29 NOTE — Progress Notes (Unsigned)
BP 125/75   Pulse 94   Temp 98.1 F (36.7 C) (Oral)   Ht '5\' 3"'$  (1.6 m)   Wt 145 lb 1.6 oz (65.8 kg)   SpO2 97%   BMI 25.70 kg/m    Subjective:    Patient ID: Maureen [name], female    DOB: 1945-12-02, 77 y.o.   MRN: [id number]  HPI: Maureen [name] is a 77 y.o. female  Chief Complaint  Patient presents with   Hyperlipidemia   To have AAA repair- saw cardiology this AM. To have stress test to see if she's OK for that on Thursday. To have angiogram on Wednesday Has been very anxious about all of this. Thought her daughter had leukemia for the past month.   Has never had problems with anesthesia. No issues with extubation. No family history of problems with anesthesia. No family history of malignant hyperthermia  ANXIETY/STRESS Duration:{Blank single:19197::"controlled","uncontrolled","better","worse","exacerbated","stable"} Anxious mood: {Blank single:19197::"yes","no"}  Excessive worrying: {Blank single:19197::"yes","no"} Irritability: {Blank single:19197::"yes","no"}  Sweating: {Blank single:19197::"yes","no"} Nausea: {Blank single:19197::"yes","no"} Palpitations:{Blank single:19197::"yes","no"} Hyperventilation: {Blank single:19197::"yes","no"} Panic attacks: {Blank single:19197::"yes","no"} Agoraphobia: {Blank single:19197::"yes","no"}  Obscessions/compulsions: {Blank single:19197::"yes","no"} Depressed mood: {Blank single:19197::"yes","no"}    02/08/2022   10:25 AM 11/08/2021    2:53 PM 08/08/2021    2:01 PM 01/19/2021    9:21 AM 01/06/2020    2:55 PM  Depression screen PHQ 2/9  Decreased Interest 0 1 0 0 2  Down, Depressed, Hopeless 0 2 0 0 2  PHQ - 2 Score 0 3 0 0 4  Altered sleeping 0 1 1 0 0  Tired, decreased energy 0 2 1 0 0  Change in appetite 0 '2 1 1 '$ 0  Feeling bad or failure about yourself  0 0 1 0 0  Trouble concentrating 0 1 1 0 0  Moving slowly or fidgety/restless 0 0 0 0 0  Suicidal thoughts 0 0 0 0 0  PHQ-9 Score 0 '9 5 1 4  '$ Difficult doing  work/chores    Not difficult at all Not difficult at all   Anhedonia: {Blank single:19197::"yes","no"} Weight changes: {Blank single:19197::"yes","no"} Insomnia: {Blank single:19197::"yes","no"} {Blank single:19197::"hard to fall asleep","hard to stay asleep"}  Hypersomnia: {Blank single:19197::"yes","no"} Fatigue/loss of energy: {Blank single:19197::"yes","no"} Feelings of worthlessness: {Blank single:19197::"yes","no"} Feelings of guilt: {Blank single:19197::"yes","no"} Impaired concentration/indecisiveness: {Blank single:19197::"yes","no"} Suicidal ideations: {Blank single:19197::"yes","no"}  Crying spells: {Blank single:19197::"yes","no"} Recent Stressors/Life Changes: {Blank single:19197::"yes","no"}   Relationship problems: {Blank single:19197::"yes","no"}   Family stress: {Blank single:19197::"yes","no"}     Financial stress: {Blank single:19197::"yes","no"}    Job stress: {Blank single:19197::"yes","no"}    Recent death/loss: {Blank single:19197::"yes","no"}   Relevant past medical, surgical, family and social history reviewed and updated as indicated. Interim medical history since our last visit reviewed. Allergies and medications reviewed and updated.  Review of Systems  Per HPI unless specifically indicated above     Objective:    BP 125/75   Pulse 94   Temp 98.1 F (36.7 C) (Oral)   Ht '5\' 3"'$  (1.6 m)   Wt 145 lb 1.6 oz (65.8 kg)   SpO2 97%   BMI 25.70 kg/m   Wt Readings from Last 3 Encounters:  05/29/22 145 lb 1.6 oz (65.8 kg)  05/29/22 144 lb 3.2 oz (65.4 kg)  05/17/22 141 lb 3.2 oz (64 kg)    Physical Exam  Results for orders placed or performed in visit on 03/09/22  Surgical pathology  Result Value Ref Range   SURGICAL PATHOLOGY      SURGICAL PATHOLOGY **** THIS IS AN AMENDED  REPORT **** CASE: [id number] PATIENT: Maureen [name] Surgical Pathology Report ********** Amendment **********  Reason for Amendment #1: Clerical  Clinical History:  Neoplasm of uncertain behavior of skin (nt)     FINAL MICROSCOPIC DIAGNOSIS:  A. 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  The specimen is inked, sectioned and entirely submitted in 1 cassette.  [facility] 03/10/2022)   Final Diagnosis performed by Tobin Chad, MD.   Electronically signed 03/14/2022 Amendment #1 performed by Tobin Chad, MD.   Electronically signed 03/14/2022 Technical component performed at [facility], [place] [address] [place], [place] [address].  Professional component performed at [facility], [place] [address] [place], [place] [address].  Immunohistochemistry Technical component (if applicable) was performed at [facility]. [address], [place], [place], [place] [address].   IMMUNOHISTOCHEMISTRY DISCLAIMER (if applicable): Some of these immunohistochemical stains may have been developed and the performance characteristics determine by [facility]. Some may not have been cleared or approved by the U.S. Food and Drug Administration. The FDA has determined that such clearance or approval is not necessary. This test is used for clinical purposes. It should not be regarded as investigational or for research. This laboratory is certified under the [place] (CLIA-88) as qualified to perform high complexity clinical laboratory testing.  The controls stained appropriately.       Assessment & Plan:   Problem List Items Addressed This Visit   None    Follow up plan: No follow-ups on file.

## 2022-05-30 ENCOUNTER — Telehealth (HOSPITAL_COMMUNITY): Payer: Self-pay | Admitting: *Deleted

## 2022-05-30 ENCOUNTER — Encounter: Payer: Self-pay | Admitting: Family Medicine

## 2022-05-30 ENCOUNTER — Telehealth: Payer: Self-pay | Admitting: Cardiology

## 2022-05-30 DIAGNOSIS — C44529 Squamous cell carcinoma of skin of other part of trunk: Secondary | ICD-10-CM | POA: Insufficient documentation

## 2022-05-30 LAB — BASIC METABOLIC PANEL
BUN/Creatinine Ratio: 11 — ABNORMAL LOW (ref 12–28)
BUN: 15 mg/dL (ref 8–27)
CO2: 22 mmol/L (ref 20–29)
Calcium: 10.3 mg/dL (ref 8.7–10.3)
Chloride: 105 mmol/L (ref 96–106)
Creatinine, Ser: 1.31 mg/dL — ABNORMAL HIGH (ref 0.57–1.00)
Glucose: 74 mg/dL (ref 70–99)
Potassium: 4.4 mmol/L (ref 3.5–5.2)
Sodium: 142 mmol/L (ref 134–144)
eGFR: 42 mL/min/{1.73_m2} — ABNORMAL LOW (ref >59)

## 2022-05-30 LAB — CBC WITH DIFFERENTIAL/PLATELET
Basophils Absolute: 0.1 10*3/uL (ref 0.0–0.2)
Basos: 1 %
EOS (ABSOLUTE): 0.1 10*3/uL (ref 0.0–0.4)
Eos: 2 %
Hematocrit: 40.2 % (ref 34.0–46.6)
Hemoglobin: 13.2 g/dL (ref 11.1–15.9)
Immature Grans (Abs): 0 10*3/uL (ref 0.0–0.1)
Immature Granulocytes: 0 %
Lymphocytes Absolute: 2.2 10*3/uL (ref 0.7–3.1)
Lymphs: 37 %
MCH: 30.6 pg (ref 26.6–33.0)
MCHC: 32.8 g/dL (ref 31.5–35.7)
MCV: 93 fL (ref 79–97)
Monocytes Absolute: 0.4 10*3/uL (ref 0.1–0.9)
Monocytes: 8 %
Neutrophils Absolute: 3.1 10*3/uL (ref 1.4–7.0)
Neutrophils: 52 %
Platelets: 203 10*3/uL (ref 150–450)
RBC: 4.32 x10E6/uL (ref 3.77–5.28)
RDW: 13.8 % (ref 11.7–15.4)
WBC: 5.9 10*3/uL (ref 3.4–10.8)

## 2022-05-30 LAB — HGB A1C W/O EAG: Hgb A1c MFr Bld: 6 % — ABNORMAL HIGH (ref 4.8–5.6)

## 2022-05-30 MED ORDER — ALPRAZOLAM 0.5 MG PO TABS: 0.5000 mg | ORAL_TABLET | Freq: Two times a day (BID) | ORAL | 1 refills | Status: DC | PRN

## 2022-05-30 NOTE — Telephone Encounter (Signed)
Pt reached and given instructions for MPI study scheduled on 06/01/22. 

## 2022-05-30 NOTE — Assessment & Plan Note (Signed)
A voluntary discussion about advance care planning including the explanation and discussion of advance directives was extensively discussed  with the patient for 3 minutes with patient present.  Explanation about the health care proxy and Living will was reviewed and packet with forms with explanation of how to fill them out was given.  During this discussion, the patient was able to identify a health care proxy as her daughter Lexine Baton and plans to fill out the paperwork required.  Patient was offered a separate [place] visit for further assistance with forms.

## 2022-05-30 NOTE — Assessment & Plan Note (Signed)
To have surgery. Following with vascular. Continue to monitor.

## 2022-05-30 NOTE — Assessment & Plan Note (Signed)
S/p removal with clear margins. Will refer to dermatology. Await their input.

## 2022-05-30 NOTE — Telephone Encounter (Signed)
Patient states she gets really freaked out about going to the doctor and would like to know if she can take a xanax before her stress test Thursday. Please advise

## 2022-05-30 NOTE — Assessment & Plan Note (Signed)
Under fair control on current regimen- exacerbated with current stressors. Continue current regimen. Continue to monitor. Call with any concerns. Refills given for 3 months. Follow up 3 months.

## 2022-05-30 NOTE — Telephone Encounter (Signed)
Spoke with pt, Aware of dr Jacalyn Lefevre recommendations. She reports her daughter will be with her.

## 2022-05-30 NOTE — Progress Notes (Signed)
BP 125/75   Pulse 94   Temp 98.1 F (36.7 C) (Oral)   Ht '5\' 3"'$  (1.6 m)   Wt 145 lb 1.6 oz (65.8 kg)   SpO2 97%   BMI 25.70 kg/m    Subjective:    Patient ID: Maureen [name], female    DOB: December 19, 1945, 77 y.o.   MRN: [id number]  HPI: Maureen [name] is a 77 y.o. female presenting on 05/29/2022 for comprehensive medical examination. Current medical complaints include:  To have AAA repair- saw cardiology this AM for clearance. She is to have stress test on Thursday. She is also to have angiogram on Wednesday. Has been very anxious about all of this. Thought her daughter had leukemia for the past month. Thankfully, it is not, but her anxiety level has been through the roof.  Has never had problems with anesthesia. No issues with extubation. No family history of problems with anesthesia. No family history of malignant hyperthermia  ANXIETY/STRESS Duration: chronic Status: exacerbated Anxious mood: yes  Excessive worrying: yes Irritability: no  Sweating: no Nausea: no Palpitations:no Hyperventilation: no Panic attacks: no Agoraphobia: no  Obscessions/compulsions: no Depressed mood: no    02/08/2022   10:25 AM 11/08/2021    2:53 PM 08/08/2021    2:01 PM 01/19/2021    9:21 AM 01/06/2020    2:55 PM  Depression screen PHQ 2/9  Decreased Interest 0 1 0 0 2  Down, Depressed, Hopeless 0 2 0 0 2  PHQ - 2 Score 0 3 0 0 4  Altered sleeping 0 1 1 0 0  Tired, decreased energy 0 2 1 0 0  Change in appetite 0 '2 1 1 '$ 0  Feeling bad or failure about yourself  0 0 1 0 0  Trouble concentrating 0 1 1 0 0  Moving slowly or fidgety/restless 0 0 0 0 0  Suicidal thoughts 0 0 0 0 0  PHQ-9 Score 0 '9 5 1 4  '$ Difficult doing work/chores    Not difficult at all Not difficult at all      02/08/2022   10:25 AM 11/08/2021    2:53 PM 08/08/2021    2:01 PM 04/21/2021    8:57 AM  GAD 7 : Generalized Anxiety Score  Nervous, Anxious, on Edge 0 '2 1 1  '$ Control/stop worrying 0 2 1 0  Worry too much -  different things 0 '2 1 1  '$ Trouble relaxing 0 2 0 0  Restless 0 2 1 0  Easily annoyed or irritable 0 1 1 0  Afraid - awful might happen 0 0 1 1  Total GAD 7 Score 0 '11 6 3  '$ Anxiety Difficulty Not difficult at all  Not difficult at all Not difficult at all   Anhedonia: no Weight changes: no Insomnia: no   Hypersomnia: no Fatigue/loss of energy: yes Feelings of worthlessness: yes Feelings of guilt: no Impaired concentration/indecisiveness: no Suicidal ideations: no  Crying spells: no Recent Stressors/Life Changes: yes   Relationship problems: no   Family stress: no     Financial stress: no    Job stress: no    Recent death/loss: no  Functional Status Survey: Is the patient deaf or have difficulty hearing?: No Does the patient have difficulty seeing, even when wearing glasses/contacts?: No Does the patient have difficulty concentrating, remembering, or making decisions?: Yes Does the patient have difficulty walking or climbing stairs?: No Does the patient have difficulty dressing or bathing?: No Does the patient have difficulty doing  errands alone such as visiting a doctor's office or shopping?: No     05/30/2022   12:10 PM 02/08/2022   10:25 AM 01/19/2021    9:13 AM 01/06/2020    2:55 PM 11/11/2018    8:31 AM  Fall Risk   Falls in the past year? 1 0 0 0 0  Number falls in past yr: 0 0 0 0 0  Injury with Fall? 0 0 0 0 0  Risk for fall due to :  No Fall Risks No Fall Risks No Fall Risks   Follow up Falls evaluation completed Falls evaluation completed Falls evaluation completed Falls evaluation completed     Depression Screen    05/30/2022   12:10 PM 02/08/2022   10:25 AM 11/08/2021    2:53 PM 08/08/2021    2:01 PM 01/19/2021    9:21 AM  Depression screen PHQ 2/9  Decreased Interest 0 0 1 0 0  Down, Depressed, Hopeless 0 0 2 0 0  PHQ - 2 Score 0 0 3 0 0  Altered sleeping 0 0 1 1 0  Tired, decreased energy 0 0 2 1 0  Change in appetite 0 0 '2 1 1  '$ Feeling bad or  failure about yourself  0 0 0 1 0  Trouble concentrating 0 0 1 1 0  Moving slowly or fidgety/restless 0 0 0 0 0  Suicidal thoughts 0 0 0 0 0  PHQ-9 Score 0 0 '9 5 1  '$ Difficult doing work/chores Not difficult at all    Not difficult at all   Advanced Directives Does patient have a HCPOA?    no Does patient have a living will or MOST form?  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 This was a histologic grade 2 tumor measuring just under 1 cm in diameter. No invasive cancer was identified. ER 90%, PR 40%.   Breast cancer, right breast ([facility]) 1994   Invasive ductal carcinoma.The patient underwent right mastectomy in 1994 for invasive ductal carcinoma.   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[facility])    GERD (gastroesophageal reflux disease)    Heart murmur 2005   Hypercholesterolemia 2012   Lung cancer [facility])    Mammographic microcalcification 2011   Panic attack    Personal history of chemotherapy 1994   Personal history of colonic polyps    Colon polyps   Personal history of other malignant neoplasm of skin 2015   basal cell   Personal history of tobacco use, presenting hazards to health    PUD (peptic ulcer disease)    Last EGD 1/09   Pulmonary nodule    Rectal polyp 2008   villous adenoma; last colonoscopy 1/09 WNL   Skin cancer, basal cell 2015   right cheek Mohs surgery at [facility] use    Ulcer     Surgical History:  Past Surgical History:  Procedure Laterality Date   APPENDECTOMY  2011   BREAST BIOPSY  12/27/2009   right breast   [place]  Negative Cath    COLONOSCOPY N/A 08/05/2014   Procedure: COLONOSCOPY;  Surgeon: [name], MD;  Location: [facility] ENDOSCOPY;  EGD, tubular adenoma 1.   COLONOSCOPY W/ BIOPSIES  2005,2009   Dr. Bary Castilla. Normal exam 2009. Villous adenoma of the rectum in 2005 adenomatous polyp from the ascending colon and rectum in 2002.   ESOPHAGOGASTRODUODENOSCOPY N/A 08/05/2014   Procedure: ESOPHAGOGASTRODUODENOSCOPY (EGD);  Surgeon: [name], MD;  Location: [facility] ENDOSCOPY;  Service: Endoscopy;  Laterality: N/A;   ESOPHAGOGASTRODUODENOSCOPY (EGD) WITH PROPOFOL N/A 07/25/2017   Procedure: ESOPHAGOGASTRODUODENOSCOPY (EGD) WITH PROPOFOL;  Surgeon: [name], MD;  Location: [facility] ENDOSCOPY;  Service: Endoscopy;  Laterality: N/A;   ESOPHAGOGASTRODUODENOSCOPY (EGD) WITH PROPOFOL N/A 08/03/2021   Procedure: ESOPHAGOGASTRODUODENOSCOPY (EGD) WITH PROPOFOL;  Surgeon: [name], MD;  Location: [facility] ENDOSCOPY;  Service: Endoscopy;  Laterality: N/A;   LAPAROSCOPIC BILATERAL SALPINGO OOPHERECTOMY Bilateral 10/05/2021   Procedure: LAPAROSCOPIC BILATERAL SALPINGO OOPHORECTOMY, REMOVAL OF PELVIC MASSES, MINI LAPAROTOMY;  Surgeon: Mellody Drown, MD;  Location: [facility] ORS;  Service: Gynecology;  Laterality: Bilateral;   MASTECTOMY     bilateral   SIMPLE MASTECTOMY  1993   right   SIMPLE MASTECTOMY  2011   left   TONSILLECTOMY  1997   UPPER GI ENDOSCOPY  2003, 2005, 2009, 2012, 2014   2012 showed mild reflux changes without evidence of Barrett's epithelial changes 2014 biopsies at 37 cm showed changes suggestive of reflux esophagitis. No metaplasia.   VAGINAL HYSTERECTOMY  1977    Medications:  Current Outpatient Medications on File Prior to Visit  Medication Sig   albuterol (PROVENTIL) (2.5 MG/3ML) 0.083% nebulizer solution Take 3 mLs (2.5 mg total) by nebulization every 6 (six) hours as needed for wheezing or shortness of breath.   albuterol (VENTOLIN HFA) 108 (90 Base) MCG/ACT inhaler Inhale 1-2 puffs into the  lungs every 6 (six) hours as needed for wheezing or shortness of breath.   Ascorbic Acid (VITAMIN C) 100 MG tablet Take 100 mg by mouth daily.   budesonide-formoterol (SYMBICORT) 160-4.5 MCG/ACT inhaler Inhale 2 puffs into the lungs 2 (two) times daily.   Calcium-Vitamins C & D (CALCIUM/C/D PO) Take 1 tablet by mouth daily.   cetirizine (ZYRTEC ALLERGY) 10 MG tablet Take 10 mg by mouth every morning.   Cholecalciferol (VITAMIN D3) 50 MCG (2000 UT) TABS Take 1 tablet by mouth daily at 6 (six) AM.   fluticasone (FLONASE) 50 MCG/ACT nasal spray Place 2 sprays into the nose every morning.   hydrocortisone (ANUSOL-HC) 25 MG suppository Place 25 mg rectally 2 (two) times daily.   ketoconazole (NIZORAL) 2 % cream Apply 1 Application topically daily.   KRILL OIL PO Take by mouth daily.   montelukast (SINGULAIR) 10 MG tablet TAKE 1 TABLET BY MOUTH EVERYDAY AT BEDTIME   mupirocin ointment (BACTROBAN) 2 % Apply 1 Application topically 2 (two) times daily.   omeprazole (PRILOSEC) 20 MG capsule TAKE 1 CAPSULE BY MOUTH EVERY DAY   vancomycin (VANCOCIN) 125 MG capsule Take 125 mg by mouth 4 (four) times daily.   Vitamin D, Ergocalciferol, (DRISDOL) 1.25 MG (50000 UNIT) CAPS capsule Take 1 capsule (50,000 Units total) by mouth every 7 (seven) days.   vitamin E 100 UNIT capsule Take 100 Units by mouth daily.   zinc gluconate 50 MG tablet Take 50 mg by mouth as needed.   No current facility-administered medications on file prior to visit.    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 She has two adult daughters. She lives in her home. Her youngest daughter and grandchildren live with her. She has two dogs. 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 She loves watching her grandson play basketball. She also loves gardening and being outdoors and working in the yard.    Social Determinants of Health   Financial Resource Strain: Not on file  Food Insecurity: Not on file  Transportation Needs: Not on file  Physical Activity: Not on file  Stress: Not on file  Social Connections: Not on file  Intimate Partner Violence: Not on file   Social History   Tobacco Use  Smoking Status Every Day   Packs/day: 0.50   Years: 40.00   Total pack years: 20.00   Types: Cigarettes  Smokeless Tobacco Never  Tobacco Comments   Pt wants to do hypnotizing   Social History   Substance and Sexual Activity  Alcohol Use Yes   Alcohol/week: 1.0 standard drink of alcohol   Types: 1 Cans of beer per week   Comment: 1 beer weekly    Family History:  Family History  Problem Relation Age of Onset   Heart attack Father    Cancer Father        esophagus   Cancer Maternal Grandmother        breast cancer   Cancer Maternal Aunt        breast cancer    Past medical history,  surgical history, medications, allergies, family history and social history reviewed with patient today and changes made to appropriate areas of the chart.   Review of Systems  Constitutional: Negative.   HENT: Negative.    Respiratory: Negative.    Musculoskeletal: Negative.   Skin: Negative.   Neurological: Negative.   Psychiatric/Behavioral:  Negative for depression, hallucinations, memory loss, substance abuse and suicidal ideas. The patient is nervous/anxious. The patient does not have insomnia.     All other ROS negative except what is listed above and in the HPI.      Objective:    BP 125/75   Pulse 94   Temp 98.1 F (36.7 C) (Oral)  Ht '5\' 3"'$  (1.6 m)   Wt 145 lb 1.6 oz (65.8 kg)   SpO2 97%   BMI 25.70 kg/m   Wt Readings from Last 3 Encounters:  05/29/22 145 lb 1.6 oz (65.8 kg)  05/29/22 144 lb 3.2 oz (65.4 kg)  05/17/22 141 lb 3.2 oz (64 kg)    Physical Exam Vitals and nursing note reviewed.  Constitutional:      General: She is not in acute distress.    Appearance: Normal appearance. She is not ill-appearing, toxic-appearing or diaphoretic.  HENT:     Head: Normocephalic and atraumatic.     Right Ear: External ear normal.     Left Ear: External ear normal.     Nose: Nose normal.     Mouth/Throat:     Mouth: Mucous membranes are moist.     Pharynx: Oropharynx is clear.  Eyes:     General: No scleral icterus.       Right eye: No discharge.        Left eye: No discharge.     Extraocular Movements: Extraocular movements intact.     Conjunctiva/sclera: Conjunctivae normal.     Pupils: Pupils are equal, round, and reactive to light.  Cardiovascular:     Rate and Rhythm: Normal rate and regular rhythm.     Pulses: Normal pulses.     Heart sounds: Normal heart sounds. No murmur heard.    No friction rub. No gallop.  Pulmonary:     Effort: Pulmonary effort is normal. No respiratory distress.     Breath sounds: Normal breath sounds. No stridor. No wheezing,  rhonchi or rales.  Chest:     Chest wall: No tenderness.  Musculoskeletal:        General: Normal range of motion.     Cervical back: Normal range of motion and neck supple.  Skin:    General: Skin is warm and dry.     Capillary Refill: Capillary refill takes less than 2 seconds.     Coloration: Skin is not jaundiced or pale.     Findings: No bruising, erythema, lesion or rash.  Neurological:     General: No focal deficit present.     Mental Status: She is alert and oriented to person, place, and time. Mental status is at baseline.  Psychiatric:        Mood and Affect: Mood normal.        Behavior: Behavior normal.        Thought Content: Thought content normal.        Judgment: Judgment normal.        05/30/2022   12:11 PM 05/25/2016   10:17 AM  6CIT Screen  What Year? 0 points 0 points  What month? 0 points 0 points  What time? 0 points 0 points  Count back from 20 0 points 0 points  Months in reverse 0 points 0 points  Repeat phrase 2 points 2 points  Total Score 2 points 2 points    Results for orders placed or performed in visit on [id number]  Basic metabolic panel  Result Value Ref Range   Glucose 74 70 - 99 mg/dL   BUN 15 8 - 27 mg/dL   Creatinine, Ser 1.31 (H) 0.57 - 1.00 mg/dL   eGFR 42 (L) >59 mL/min/1.73   BUN/Creatinine Ratio 11 (L) 12 - 28   Sodium 142 134 - 144 mmol/L   Potassium 4.4 3.5 - 5.2 mmol/L   Chloride 105 96 -  106 mmol/L   CO2 22 20 - 29 mmol/L   Calcium 10.3 8.7 - 10.3 mg/dL  CoaguChek XS/INR Waived  Result Value Ref Range   INR 1.0 0.9 - 1.1   Prothrombin Time 12.0 sec  CBC with Differential/Platelet  Result Value Ref Range   WBC 5.9 3.4 - 10.8 x10E3/uL   RBC 4.32 3.77 - 5.28 x10E6/uL   Hemoglobin 13.2 11.1 - 15.9 g/dL   Hematocrit 40.2 34.0 - 46.6 %   MCV 93 79 - 97 fL   MCH 30.6 26.6 - 33.0 pg   MCHC 32.8 31.5 - 35.7 g/dL   RDW 13.8 11.7 - 15.4 %   Platelets 203 150 - 450 x10E3/uL   Neutrophils 52 Not Estab. %   Lymphs 37 Not  Estab. %   Monocytes 8 Not Estab. %   Eos 2 Not Estab. %   Basos 1 Not Estab. %   Neutrophils Absolute 3.1 1.4 - 7.0 x10E3/uL   Lymphocytes Absolute 2.2 0.7 - 3.1 x10E3/uL   Monocytes Absolute 0.4 0.1 - 0.9 x10E3/uL   EOS (ABSOLUTE) 0.1 0.0 - 0.4 x10E3/uL   Basophils Absolute 0.1 0.0 - 0.2 x10E3/uL   Immature Granulocytes 0 Not Estab. %   Immature Grans (Abs) 0.0 0.0 - 0.1 x10E3/uL  Hgb A1c w/o eAG  Result Value Ref Range   Hgb A1c MFr Bld 6.0 (H) 4.8 - 5.6 %      Assessment & Plan:   Problem List Items Addressed This Visit       Cardiovascular and Mediastinum   Abdominal aortic aneurysm (AAA) without rupture ([facility])    To have surgery. Following with vascular. Continue to monitor.         Musculoskeletal and Integument   Squamous cell carcinoma of back    S/p removal with clear margins. Will refer to dermatology. Await their input.      Relevant Medications   ALPRAZolam (XANAX) 0.5 MG tablet   Other Relevant Orders   Ambulatory referral to Dermatology     Other   Generalized anxiety disorder    Under fair control on current regimen- exacerbated with current stressors. Continue current regimen. Continue to monitor. Call with any concerns. Refills given for 3 months. Follow up 3 months.        Relevant Medications   ALPRAZolam (XANAX) 0.5 MG tablet   Advance care planning    A voluntary discussion about advance care planning including the explanation and discussion of advance directives was extensively discussed  with the patient for 3 minutes with patient present.  Explanation about the health care proxy and Living will was reviewed and packet with forms with explanation of how to fill them out was given.  During this discussion, the patient was able to identify a health care proxy as her daughter Lexine Baton and plans to fill out the paperwork required.  Patient was offered a separate [place] visit for further assistance with forms.         Other Visit  Diagnoses     Encounter for annual wellness exam in Medicare patient    -  Primary   Preventative care discussed today as below.   Preop exam for internal medicine       To have cardiac clearance. Labs are normal. Cleared from general perpective. Await cardiology.   Relevant Orders   Basic metabolic panel (Completed)   CoaguChek XS/INR Waived (Completed)   CBC with Differential/Platelet (Completed)   Hgb A1c w/o eAG (Completed)  Preventative Services:  AAA screening: Up to date Health Risk Assessment and Personalized Prevention Plan: Done today Bone Mass Measurements: up to date Breast Cancer Screening: N/A CVD Screening: up to date Cervical Cancer Screening: N/A Colon Cancer Screening: N/A Depression Screening: Done today Diabetes Screening: Done today Glaucoma Screening: See your eye doctor Hepatitis B vaccine: N/A Hepatitis C screening: up to date HIV Screening: up to date Flu Vaccine: up to date Lung cancer Screening: Scheduled Obesity Screening: Done today Pneumonia Vaccines (2): up to date STI Screening: N/A  Follow up plan: Return in about 3 months (around 08/29/2022).   LABORATORY TESTING:  - Pap smear: not applicable  IMMUNIZATIONS:   - Tdap: Tetanus vaccination status reviewed: last tetanus booster within 10 years. - Influenza: Up to date - Pneumovax: Up to date - Prevnar: Up to date - Zostavax vaccine: Refused  SCREENING: -Mammogram: Not applicable  - Colonoscopy: Up to date  - Bone Density: Up to date   PATIENT COUNSELING:   Advised to take 1 mg of folate supplement per day if capable of pregnancy.   Sexuality: Discussed sexually transmitted diseases, partner selection, use of condoms, avoidance of unintended pregnancy  and contraceptive alternatives.   Advised to avoid cigarette smoking.  I discussed with the patient that most people either abstain from alcohol or drink within safe limits (<=14/week and <=4 drinks/occasion for males,  <=7/weeks and <= 3 drinks/occasion for females) and that the risk for alcohol disorders and other health effects rises proportionally with the number of drinks per week and how often a drinker exceeds daily limits.  Discussed cessation/primary prevention of drug use and availability of treatment for abuse.   Diet: Encouraged to adjust caloric intake to maintain  or achieve ideal body weight, to reduce intake of dietary saturated fat and total fat, to limit sodium intake by avoiding high sodium foods and not adding table salt, and to maintain adequate dietary potassium and calcium preferably from fresh fruits, vegetables, and low-fat dairy products.    stressed the importance of regular exercise  Injury prevention: Discussed safety belts, safety helmets, smoke detector, smoking near bedding or upholstery.   Dental health: Discussed importance of regular tooth brushing, flossing, and dental visits.    NEXT PREVENTATIVE PHYSICAL DUE IN 1 YEAR. Return in about 3 months (around 08/29/2022).

## 2022-05-30 NOTE — Telephone Encounter (Signed)
Pt states she gets very bad panic attacks and wants to know if its okay to take 0.'5mg'$  xanax before her stress test

## 2022-05-31 ENCOUNTER — Ambulatory Visit: Payer: Medicare HMO

## 2022-06-01 ENCOUNTER — Ambulatory Visit (HOSPITAL_COMMUNITY): Payer: Medicare HMO | Attending: Cardiovascular Disease

## 2022-06-01 DIAGNOSIS — Z01810 Encounter for preprocedural cardiovascular examination: Secondary | ICD-10-CM | POA: Diagnosis present

## 2022-06-01 LAB — MYOCARDIAL PERFUSION IMAGING
LV dias vol: 43 mL (ref 46–106)
LV sys vol: 13 mL
Nuc Stress EF: 70 %
Peak HR: 105 {beats}/min
Rest HR: 75 {beats}/min
Rest Nuclear Isotope Dose: 11 mCi
SDS: 0
SRS: 0
SSS: 0
ST Depression (mm): 0 mm
Stress Nuclear Isotope Dose: 32.5 mCi
TID: 1

## 2022-06-01 MED ORDER — TECHNETIUM TC 99M TETROFOSMIN IV KIT
11.0000 | PACK | Freq: Once | INTRAVENOUS | Status: AC | PRN
  Administered 2022-06-01: 11 via INTRAVENOUS

## 2022-06-01 MED ORDER — REGADENOSON 0.4 MG/5ML IV SOLN
0.4000 mg | Freq: Once | INTRAVENOUS | Status: AC
  Administered 2022-06-01: 0.4 mg via INTRAVENOUS

## 2022-06-01 MED ORDER — TECHNETIUM TC 99M TETROFOSMIN IV KIT
32.5000 | PACK | Freq: Once | INTRAVENOUS | Status: AC | PRN
  Administered 2022-06-01: 32.5 via INTRAVENOUS

## 2022-06-05 ENCOUNTER — Ambulatory Visit
Admission: RE | Admit: 2022-06-05 | Discharge: 2022-06-05 | Disposition: A | Discharge status: Home / Self Care | Payer: Medicare HMO | Source: Ambulatory Visit | Attending: Nurse Practitioner | Admitting: Nurse Practitioner

## 2022-06-05 ENCOUNTER — Ambulatory Visit
Admission: RE | Admit: 2022-06-05 | Discharge: 2022-06-05 | Disposition: A | Discharge status: Home / Self Care | Payer: Medicare HMO | Source: Ambulatory Visit | Attending: Radiation Oncology | Admitting: Radiation Oncology

## 2022-06-05 ENCOUNTER — Other Ambulatory Visit: Payer: Medicare HMO

## 2022-06-05 ENCOUNTER — Encounter: Payer: Self-pay | Admitting: *Deleted

## 2022-06-05 DIAGNOSIS — C3490 Malignant neoplasm of unspecified part of unspecified bronchus or lung: Secondary | ICD-10-CM | POA: Insufficient documentation

## 2022-06-05 DIAGNOSIS — I714 Abdominal aortic aneurysm, without rupture, unspecified: Secondary | ICD-10-CM

## 2022-06-05 MED ORDER — IOHEXOL 350 MG/ML SOLN
60.0000 mL | Freq: Once | INTRAVENOUS | Status: AC | PRN
  Administered 2022-06-05: 60 mL via INTRAVENOUS

## 2022-06-05 NOTE — Progress Notes (Signed)
Can we get her in to see GS to review her CT and discuss next steps

## 2022-06-07 ENCOUNTER — Encounter: Payer: Self-pay | Admitting: Licensed Clinical Social Worker

## 2022-06-07 ENCOUNTER — Encounter: Payer: Self-pay | Admitting: Radiation Oncology

## 2022-06-07 ENCOUNTER — Other Ambulatory Visit: Payer: Self-pay | Admitting: *Deleted

## 2022-06-07 ENCOUNTER — Ambulatory Visit
Admission: RE | Admit: 2022-06-07 | Discharge: 2022-06-07 | Disposition: A | Discharge status: Home / Self Care | Payer: Medicare HMO | Source: Ambulatory Visit | Attending: Radiation Oncology | Admitting: Radiation Oncology

## 2022-06-07 ENCOUNTER — Telehealth: Payer: Self-pay | Admitting: *Deleted

## 2022-06-07 VITALS — BP 117/71 | HR 98 | Temp 97.4°F | Resp 16 | Wt 143.0 lb

## 2022-06-07 DIAGNOSIS — C3412 Malignant neoplasm of upper lobe, left bronchus or lung: Secondary | ICD-10-CM | POA: Insufficient documentation

## 2022-06-07 DIAGNOSIS — Z510 Encounter for antineoplastic radiation therapy: Secondary | ICD-10-CM | POA: Diagnosis not present

## 2022-06-07 DIAGNOSIS — R911 Solitary pulmonary nodule: Secondary | ICD-10-CM

## 2022-06-07 DIAGNOSIS — C3490 Malignant neoplasm of unspecified part of unspecified bronchus or lung: Secondary | ICD-10-CM

## 2022-06-07 NOTE — Progress Notes (Signed)
[place] Work  Clinical Social Work was referred by nurse for assessment of psychosocial needs.  Clinical Social Worker  attempted to contact main contact/patient's daughter Vaughan Browner   to offer support and assess for needs.   CSW was unable to leave message due to full voicemail.  CSW updated Radiation RN, and gave the RN my contact information for patient.  FA  Adelene Amas, LCSW  Clinical Social Worker [facility]

## 2022-06-07 NOTE — Telephone Encounter (Signed)
Daughter asking for letter to state that her son is the primary caregiver for patient. She states she needs this note for her food stamps. Would you give her a call and advise her on this matter thanks,

## 2022-06-08 NOTE — Progress Notes (Signed)
Radiation Oncology Follow up Note  Name: Maureen [name]   Date:   06/07/2022 MRN:  [id number] DOB: 06-Jul-1945    This 77 y.o. female presents to the clinic today for follow-up for possible stage I non-small cell lung cancer of the lung.Marland Kitchen  REFERRING PROVIDER: Valerie Roys, DO  HPI: Patient is a 77 year old female who I been following with serial CT scans.  She previous had a 1 cm lesion in the right upper lobe.  She continues to be asymptomatic.  She had a PET CT scan back in May which showed bilateral pulmonary nodules technically too small to characterize by PET.  She also had a solid lesion left upper lobe which was not hypermetabolic.Marland Kitchen  Recent CT scan this month showed enlarging irregular left upper lobe nodule highly indicative of primary bronchogenic carcinoma.  The upper and midlung zone.  Bronchial vascular lesions had groundglass and solid nodules similar to 11/30/2021 suggestive of a Langerhans' cell histiocytosis.  COMPLICATIONS OF TREATMENT: none  FOLLOW UP COMPLIANCE: keeps appointments   PHYSICAL EXAM:  BP 117/71   Pulse 98   Temp (!) 97.4 F (36.3 C) (Tympanic)   Resp 16   Wt 143 lb (64.9 kg)   BMI 25.33 kg/m  Well-developed well-nourished patient in NAD. HEENT reveals PERLA, EOMI, discs not visualized.  Oral cavity is clear. No oral mucosal lesions are identified. Neck is clear without evidence of cervical or supraclavicular adenopathy. Lungs are clear to A&P. Cardiac examination is essentially unremarkable with regular rate and rhythm without murmur rub or thrill. Abdomen is benign with no organomegaly or masses noted. Motor sensory and DTR levels are equal and symmetric in the upper and lower extremities. Cranial nerves II through XII are grossly intact. Proprioception is intact. No peripheral adenopathy or edema is identified. No motor or sensory levels are noted. Crude visual fields are within normal range.  RADIOLOGY RESULTS: Serial CT scans and PET CT scans  reviewed compatible with above-stated findings.  PLAN: At this time I have offered the patient evaluation with via bronchoscopy to try to biopsy the left upper lobe lesion.  She has declined that would like to pursue treatment at this point in time.  I have offered SBRT 60 Gray in 5 fractions.  Risks and benefits of treatment including extremely small side effect profile were explained to the patient in detail.  Would use for dimensional treatment planning as well as motion restriction for treatment planning.  I have personally 7 ordered CT simulation for next week.  Patient comprehends my recommendations well.  I would like to take this opportunity to thank you for allowing me to participate in the care of your patient.Noreene Filbert, MD

## 2022-06-12 ENCOUNTER — Ambulatory Visit (INDEPENDENT_AMBULATORY_CARE_PROVIDER_SITE_OTHER): Payer: Medicare HMO | Admitting: Vascular Surgery

## 2022-06-12 ENCOUNTER — Encounter (INDEPENDENT_AMBULATORY_CARE_PROVIDER_SITE_OTHER): Payer: Self-pay | Admitting: Vascular Surgery

## 2022-06-12 VITALS — BP 144/78 | HR 96 | Resp 16 | Wt 143.0 lb

## 2022-06-12 DIAGNOSIS — I70212 Atherosclerosis of native arteries of extremities with intermittent claudication, left leg: Secondary | ICD-10-CM

## 2022-06-12 DIAGNOSIS — J4489 Other specified chronic obstructive pulmonary disease: Secondary | ICD-10-CM | POA: Diagnosis not present

## 2022-06-12 DIAGNOSIS — E782 Mixed hyperlipidemia: Secondary | ICD-10-CM

## 2022-06-12 DIAGNOSIS — I714 Abdominal aortic aneurysm, without rupture, unspecified: Secondary | ICD-10-CM

## 2022-06-12 DIAGNOSIS — K219 Gastro-esophageal reflux disease without esophagitis: Secondary | ICD-10-CM

## 2022-06-12 NOTE — Progress Notes (Signed)
MRN : [id number]  Maureen [name] is a 77 y.o. (07-23-1945) female who presents with chief complaint of check circulation.  History of Present Illness:   The patient is seen for follow up evaluation of AAA status post CTA. There were no problems or complications related to the CT scan. The patient denies interval development of abdominal or back pain. No new lower extremity pain or discoloration of the toes.   The patient denies recent episodes of angina or shortness of breath. The patient denies interval anaurosis fugax. There is no recent history of TIA symptoms or focal motor deficits. The patient denies PAD or claudication symptoms.   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  No outpatient medications have been marked as taking for the 06/12/22 encounter (Appointment) with Delana Meyer, Dolores Lory, MD.    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 This was a histologic grade 2 tumor measuring just under 1 cm in diameter. No invasive cancer was identified. ER 90%, PR 40%.   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   Bronchitis    COPD (chronic obstructive pulmonary disease) ([place])    Discoid lupus 2005   Emphysematous bleb (HCC)    GERD (gastroesophageal reflux disease)    Heart murmur 2005   Hypercholesterolemia 2012   Lung cancer [facility])    Mammographic microcalcification 2011   Panic attack    Personal history of chemotherapy 1994   Personal history of colonic polyps    Colon polyps   Personal history of other malignant neoplasm of skin 2015   basal cell    Personal history of tobacco use, presenting hazards to health    PUD (peptic ulcer disease)    Last EGD 1/09   Pulmonary nodule    Rectal polyp 2008   villous adenoma; last colonoscopy 1/09 WNL   Skin cancer, basal cell 2015   right cheek Mohs surgery at [place] use    Ulcer     Past Surgical History:  Procedure Laterality Date   APPENDECTOMY  2011   BREAST BIOPSY  12/27/2009   right breast   CARDIAC CATHETERIZATION  1996   Negative Cath   COLONOSCOPY N/A 08/05/2014   Procedure: COLONOSCOPY;  Surgeon: [name], MD;  Location: [facility] ENDOSCOPY;  EGD, tubular adenoma 1.   COLONOSCOPY W/ BIOPSIES  2005,2009   Dr. Bary Castilla. Normal exam 2009. Villous adenoma of the rectum in 2005 adenomatous polyp from the ascending colon and rectum in 2002.   ESOPHAGOGASTRODUODENOSCOPY N/A 08/05/2014   Procedure: ESOPHAGOGASTRODUODENOSCOPY (EGD);  Surgeon: [name], MD;  Location: [facility] ENDOSCOPY;  Service: Endoscopy;  Laterality: N/A;   ESOPHAGOGASTRODUODENOSCOPY (EGD) WITH PROPOFOL N/A 07/25/2017   Procedure: ESOPHAGOGASTRODUODENOSCOPY (EGD) WITH PROPOFOL;  Surgeon: [name], MD;  Location: [facility] ENDOSCOPY;  Service: Endoscopy;  Laterality: N/A;   ESOPHAGOGASTRODUODENOSCOPY (EGD) WITH PROPOFOL N/A 08/03/2021   Procedure: ESOPHAGOGASTRODUODENOSCOPY (EGD) WITH PROPOFOL;  Surgeon: [name], MD;  Location:  [place] ENDOSCOPY;  Service: Endoscopy;  Laterality: N/A;   LAPAROSCOPIC BILATERAL SALPINGO OOPHERECTOMY Bilateral 10/05/2021   Procedure: LAPAROSCOPIC BILATERAL SALPINGO OOPHORECTOMY, REMOVAL OF PELVIC MASSES, MINI LAPAROTOMY;  Surgeon: Mellody Drown, MD;  Location: [facility] ORS;  Service: Gynecology;  Laterality: Bilateral;   MASTECTOMY     bilateral   SIMPLE MASTECTOMY  1993   right   SIMPLE MASTECTOMY  2011   left   TONSILLECTOMY  1997   UPPER GI ENDOSCOPY  2003, 2005, 2009, 2012, 2014   2012 showed mild reflux changes without evidence of Barrett's  epithelial changes 2014 biopsies at 37 cm showed changes suggestive of reflux esophagitis. No metaplasia.   VAGINAL HYSTERECTOMY  1977    Social History Social History   Tobacco Use   Smoking status: Every Day    Packs/day: 0.50    Years: 40.00    Additional pack years: 0.00    Total pack years: 20.00    Types: Cigarettes   Smokeless tobacco: Never   Tobacco comments:    Pt wants to do hypnotizing  Vaping Use   Vaping Use: Never used  Substance Use Topics   Alcohol use: Yes    Alcohol/week: 1.0 standard drink of alcohol    Types: 1 Cans of beer per week    Comment: 1 beer weekly   Drug use: No    Family History Family History  Problem Relation Age of Onset   Heart attack Father    Cancer Father        esophagus   Cancer Maternal Grandmother        breast cancer   Cancer Maternal Aunt        breast cancer    Allergies  Allergen Reactions   Doxycycline Rash   Codeine Itching   Incruse Ellipta [Umeclidinium Bromide] Other (See Comments)    Oral sores   Sulfa Antibiotics Hives   Sulfinpyrazone    Erythromycin Rash   Penicillins Rash     REVIEW OF SYSTEMS (Negative unless checked)  Constitutional: [] Weight loss  [] Fever  [] Chills Cardiac: [] Chest pain   [] Chest pressure   [] Palpitations   [] Shortness of breath when laying flat   [] Shortness of breath with exertion. Vascular:  [x] Pain in legs with walking   [] Pain in legs at rest  [] History of DVT   [] Phlebitis   [] Swelling in legs   [] Varicose veins   [] Non-healing ulcers Pulmonary:   [] Uses home oxygen   [] Productive cough   [] Hemoptysis   [] Wheeze  [x] COPD   [] Asthma Neurologic:  [] Dizziness   [] Seizures   [] History of stroke   [] History of TIA  [] Aphasia   [] Vissual changes   [] Weakness or numbness in arm   [] Weakness or numbness in leg Musculoskeletal:   [] Joint swelling   [] Joint pain   [] Low back pain Hematologic:  [] Easy bruising  [] Easy bleeding   [] Hypercoagulable state   [] Anemic Gastrointestinal:   [] Diarrhea   [] Vomiting  [x] Gastroesophageal reflux/heartburn   [] Difficulty swallowing. Genitourinary:  [] Chronic kidney disease   [] Difficult urination  [] Frequent urination   [] Blood in urine Skin:  [] Rashes   [] Ulcers  Psychological:  [x] History of anxiety   []  History of major depression.  Physical Examination  There were no vitals filed for this visit. There is no height or weight on file to calculate BMI. Gen: WD/WN, NAD Head: [place]/AT, No temporalis wasting.  Ear/Nose/Throat: Hearing grossly intact, nares w/o erythema or drainage Eyes: PER, EOMI, sclera nonicteric.  Neck: Supple, no masses.  No bruit or JVD.  Pulmonary:  Good air movement, no audible wheezing, no use of accessory muscles.  Cardiac: RRR, normal S1, S2, no Murmurs. Vascular:   Vessel Right Left  Radial Palpable Palpable  PT Not Palpable Not Palpable  DP Not Palpable Not Palpable  Gastrointestinal: soft, non-distended. No guarding/no peritoneal signs.  Musculoskeletal: M/S 5/5 throughout.  No visible deformity.  Neurologic: CN 2-12 intact. Pain and light touch intact in extremities.  Symmetrical.  Speech is fluent. Motor exam as listed above. Psychiatric: Judgment intact, Mood & affect appropriate for pt's clinical situation. Dermatologic: No rashes or ulcers noted.  No changes consistent with cellulitis.   CBC Lab Results  Component Value Date   WBC 5.9 05/29/2022   HGB 13.2 05/29/2022   HCT 40.2 05/29/2022   MCV 93 05/29/2022   PLT 203 05/29/2022    BMET    Component Value Date/Time   NA 142 05/29/2022 1449   K 4.4 05/29/2022 1449   CL 105 05/29/2022 1449   CO2 22 05/29/2022 1449   GLUCOSE 74 05/29/2022 1449   GLUCOSE 101 (H) 11/24/2021 1117   BUN 15 05/29/2022 1449   CREATININE 1.31 (H) 05/29/2022 1449   CALCIUM 10.3 05/29/2022 1449   GFRNONAA 55 (L) 11/24/2021 1117   GFRAA 63 02/03/2020 0859   Estimated Creatinine Clearance: 33.1 mL/min (A) (by C-G formula based on SCr of 1.31 mg/dL  (H)).  COAG Lab Results  Component Value Date   INR 1.0 05/29/2022    Radiology CT Chest W Contrast  Result Date: 06/06/2022 CLINICAL DATA:  Non-small cell lung cancer intermittent cough. * Tracking Code: BO * EXAM: CT CHEST WITH CONTRAST TECHNIQUE: Multidetector CT imaging of the chest was performed during intravenous contrast administration. RADIATION DOSE REDUCTION: This exam was performed according to the departmental dose-optimization program which includes automated exposure control, adjustment of the mA and/or kV according to patient size and/or use of iterative reconstruction technique. CONTRAST:  42mL OMNIPAQUE IOHEXOL 350 MG/ML SOLN COMPARISON:  CT chest 11/30/2021, PET 07/22/2021. FINDINGS: Cardiovascular: Atherosclerotic calcification of the aorta, aortic valve and coronary arteries. Lipomatous hypertrophy of the interatrial septum. Heart size stable. No pericardial effusion. Mediastinum/Nodes: Mediastinal lymph nodes measure up to 9 mm in the low right paratracheal station, unchanged. No hilar or axillary adenopathy. Surgical clips in the right axilla. Esophagus is grossly unremarkable. Lungs/Pleura: Centrilobular emphysema. Irregular solid apical left upper lobe nodule measures 11 x 13 mm (3/35), enlarged from 6 x 8 mm on 11/30/2021. Numerous additional smaller peribronchovascular ground-glass and solid nodules bilaterally, as before. Of note, these are upper and midlung zone predominant. No pleural fluid. Debris is seen in the airway. Upper Abdomen: Liver is slightly decreased in attenuation diffusely. Visualized portions of the liver and gallbladder are otherwise unremarkable. Bilateral adrenal nodules measure 2.6 cm on the right and 2.0 cm on the left. These measured -5 Hounsfield units on the right and -2 Hounsfield units on the left on noncontrast PET 07/22/2021. No specific follow-up necessary. Low-attenuation lesions in the kidneys are too small to characterize or are partially  imaged. No specific follow-up necessary. Visualized portions of the kidneys, spleen, pancreas, stomach and bowel are otherwise grossly unremarkable. No upper abdominal adenopathy. Partially imaged infrarenal aortic aneurysm measures at least 4.2 cm. Musculoskeletal: Degenerative changes in the spine. No worrisome lytic or sclerotic lesions. IMPRESSION: 1. Enlarging irregular left upper lobe nodule, highly indicative of primary bronchogenic carcinoma. 2. Upper and midlung zone predominant peribronchovascular ground-glass and solid nodules, similar to  11/30/2021 and suggestive of Langerhans cell histiocytosis. Obviously, adenocarcinoma cannot be excluded. Recommend continued attention on follow-up. 3. Liver appears steatotic. 4. Bilateral adrenal adenomas. 5. Partially imaged infrarenal aortic aneurysm. 6. Emphysema (ICD10-J43.9). 7. Aortic atherosclerosis (ICD10-I70.0). Coronary artery calcification. Electronically Signed   By: Lorin Picket M.D.   On: 06/06/2022 12:54   CT Angio Abd/Pel w/ and/or w/o  Result Date: 06/05/2022 CLINICAL DATA:  77 year old female with history of abdominal aortic aneurysm. Follow-up study. EXAM: CT ANGIOGRAPHY ABDOMEN AND PELVIS WITH CONTRAST AND WITHOUT CONTRAST TECHNIQUE: Multidetector CT imaging of the abdomen and pelvis was performed using the standard protocol during bolus administration of intravenous contrast. Multiplanar reconstructed images and MIPs were obtained and reviewed to evaluate the vascular anatomy. RADIATION DOSE REDUCTION: This exam was performed according to the departmental dose-optimization program which includes automated exposure control, adjustment of the mA and/or kV according to patient size and/or use of iterative reconstruction technique. CONTRAST:  31mL OMNIPAQUE IOHEXOL 350 MG/ML SOLN COMPARISON:  PET-CT from 07/22/2021, 10/01/2018 FINDINGS: VASCULAR Aorta: There is a bilobed, juxtarenal abdominal aortic aneurysm measuring up to 5.1 cm in greatest  short axis dimension. This is similar in size from morphology from comparison PET-CT however enlarged from 3.6 cm from abdominal ultrasound in 2020. The abdominal aorta is patent throughout. Scattered fibrofatty and calcific atherosclerotic changes. Just inferior to the IMA ostium there is moderate focal stenosis about the inferior aspect of the aneurysm resulting in approximately 60% stenosis. Celiac: Moderate proximal and ostial stenosis secondary to atherosclerotic plaque and median arcuate ligament compression. Patent distally. SMA: Mild proximal stenosis, likely secondary to atherosclerotic plaque. Patent distally. Renals: Single bilateral renal arteries are patent without evidence of aneurysm, dissection, vasculitis, fibromuscular dysplasia or significant stenosis. IMA: Possible ostial occlusion secondary to aneurysmal involvement with distal reconstitution. 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 Distal reconstitution to the external and internal iliac arteries on the left via iliolumbar collateral. Patent right inflow vessels. Proximal Outflow: Bilateral common femoral and visualized portions of the superficial and profunda femoral arteries are patent without evidence of aneurysm, dissection, vasculitis or significant stenosis. Veins: No obvious venous abnormality within the limitations of this arterial phase study. Review of the MIP images confirms the above findings. NON-VASCULAR Lower chest: Please refer to the dedicated chest CT from the same day for intrathoracic findings. Hepatobiliary: No focal liver abnormality is seen. No gallstones, gallbladder wall thickening, or biliary dilatation. Pancreas: Unremarkable. No pancreatic ductal dilatation or surrounding inflammatory changes. Spleen: Normal in size without focal abnormality. Adrenals/Urinary Tract: Similar appearing bilateral symmetric adrenal thickening. Similar appearance of multifocal bilateral simple renal cysts, the largest  about the right interpolar cortex measuring up to 3.1 cm. Kidneys are otherwise normal, without renal calculi, focal lesion, or hydronephrosis. Bladder is unremarkable. Stomach/Bowel: Stomach is within normal limits. Appendix is not definitively identified. No evidence of bowel wall thickening, distention, or inflammatory changes. Lymphatic: No abdominopelvic lymphadenopathy. Reproductive: Status post hysterectomy. No adnexal masses. Other: No abdominal wall hernia or abnormality. No abdominopelvic ascites. Musculoskeletal: No acute or significant osseous findings. IMPRESSION: VASCULAR 1. Evidence of slight enlargement of bilobed, juxtarenal abdominal aortic aneurysm measuring up to 5.1 cm. Recommend follow-up CT/MR every 6 months and vascular consultation. This recommendation follows ACR consensus guidelines: White Paper of the ACR Incidental Findings Committee II on Vascular Findings. J Am Coll Radiol 2013; 10:789-794. 2. 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 3. Approximately 60% focal stenosis of the distal infrarenal abdominal aorta, just inferior to the  IMA ostium. NON-VASCULAR No acute or significant abdominopelvic abnormality. Ruthann Cancer, MD [facility] Electronically Signed   By: Ruthann Cancer M.D.   On: 06/05/2022 09:44   MYOCARDIAL PERFUSION IMAGING  Result Date: 06/01/2022   LV perfusion is normal. There is no evidence of ischemia. There is no evidence of infarction.   Left ventricular function is normal. Nuclear stress EF: 70 %. The left ventricular ejection fraction is hyperdynamic (>65%). End diastolic cavity size is normal.   The study is normal. The study is low risk.   VAS US AORTA/IVC/ILIACS  Result Date: 05/18/2022 ABDOMINAL AORTA STUDY Patient Name:  Maureen [name]  Date of Exam:   05/17/2022 Medical Rec #: [id number]         Accession #:    [id number] Date of Birth: 1945/08/07         Patient Gender: F Patient Age:    10 years Exam Location:  [facility] Procedure:      VAS US AORTA/IVC/ILIACS Referring Phys: Hortencia Pilar --------------------------------------------------------------------------------  Indications: Follow up exam for known AAA.  Performing Technologist: Almira Coaster RVS  Examination Guidelines: A complete evaluation includes B-mode imaging, spectral Doppler, color Doppler, and power Doppler as needed of all accessible portions of each vessel. Bilateral testing is considered an integral part of a complete examination. Limited examinations for reoccurring indications may be performed as noted.  Abdominal Aorta Findings: +-----------+-------+----------+----------+----------+--------+--------+ Location   AP (cm)Trans (cm)PSV (cm/s)Waveform  ThrombusComments +-----------+-------+----------+----------+----------+--------+--------+ Proximal   2.72   2.66      61        monophasic                 +-----------+-------+----------+----------+----------+--------+--------+ Mid        2.38   2.72      70        monophasic                 +-----------+-------+----------+----------+----------+--------+--------+ Distal     4.72   4.96      47        monophasic                 +-----------+-------+----------+----------+----------+--------+--------+ RT CIA Prox1.4    1.6       134       biphasic                   +-----------+-------+----------+----------+----------+--------+--------+ LT CIA Prox1.4    1.4       127       biphasic                   +-----------+-------+----------+----------+----------+--------+--------+  Summary: Abdominal Aorta: There is evidence of abnormal dilatation of the proximal, mid and distal Abdominal aorta. The largest aortic measurement is 5.0 cm. The largest aortic diameter has increased compared to prior exam. Previous diameter measurement was 4.4 cm obtained on 11/14/2021.  *See table(s) above for measurements and observations.   Electronically signed by Hortencia Pilar MD on 05/18/2022 at 6:35:40 PM.    Final      Assessment/Plan 1. Abdominal aortic aneurysm (AAA) without rupture, unspecified part ([place]) Recommend:  The aneurysm is > 5 cm and therefore should undergo repair. Patient is status post CT scan of the abdominal aorta. The patient is not a candidate for endovascular repair with a conventional graft but I believe she is a candidate for a fenestrated stent.  I have discussed with her being evaluated at  [facility] by Dr Sammuel Hines and will place a referral.  The patient has been cleared by cardiology.   The patient will continue antiplatelet therapy as prescribed (since the patient is undergoing endovascular repair as opposed to open repair) as well as aggressive management of hyperlipidemia. Exercise is again strongly encouraged.   The patient is reminded that lifetime routine surveillance is a necessity with an endograft.   The risks and benefits of AAA repair are reviewed with the patient.  All questions are answered.  Alternative therapies are also discussed.  The patient agrees to proceed with endovascular aneurysm repair.  Patient will follow-up with me in the office after the surgery. - Ambulatory referral to Vascular Surgery  2. Atherosclerosis of native artery of left lower extremity with intermittent claudication (HCC) Her chronic left iliac artery occlusion can be addressed at the time of  AAA repair.  3. COPD (chronic obstructive pulmonary disease) with chronic bronchitis Continue pulmonary medications and aerosols as already ordered, these medications have been reviewed and there are no changes at this time.   4. Mixed hyperlipidemia Continue statin as ordered and reviewed, no changes at this time  5. Gastroesophageal reflux disease, unspecified whether esophagitis present Continue PPI as already ordered, this medication has been reviewed and there are no changes at this time.  Avoidence of caffeine  and alcohol  Moderate elevation of the head of the bed     Hortencia Pilar, MD  06/12/2022 3:09 PM

## 2022-06-13 ENCOUNTER — Encounter: Payer: Self-pay | Admitting: Licensed Clinical Social Worker

## 2022-06-13 NOTE — Progress Notes (Addendum)
[facility] CSW Progress Note  Clinical Education officer, museum contacted caregiver by phone to discuss request for provider letter.  Patient's daughter Ms. Carrom stated the patient needs a letter from her provider stating, her full-time primary caregiver is her grandson Doretha Imus to assist in meeting SNAP benefits criteria.  Ms. Marily Memos stated the patient needs constant assistance with her ADLs and full-time care due to he medical conditions.  CSW sent secure chat to radiologist provider, radiology RN and RN navigator with update with this request.   Update: radiology provider recommended patient request letter from PCP, since they are much more involved in her care.  CSW left voicemail at [phone] the patient's primary phone and sent a text to patient's daughter's phone, since her voicemail is not set up to take messages with update.  Adelene Amas, LCSW

## 2022-06-14 ENCOUNTER — Ambulatory Visit
Admission: RE | Admit: 2022-06-14 | Discharge: 2022-06-14 | Disposition: A | Discharge status: Home / Self Care | Payer: Medicare HMO | Source: Ambulatory Visit | Attending: Radiation Oncology | Admitting: Radiation Oncology

## 2022-06-14 DIAGNOSIS — Z510 Encounter for antineoplastic radiation therapy: Secondary | ICD-10-CM | POA: Diagnosis not present

## 2022-06-15 DIAGNOSIS — Z510 Encounter for antineoplastic radiation therapy: Secondary | ICD-10-CM | POA: Diagnosis not present

## 2022-06-26 ENCOUNTER — Other Ambulatory Visit: Payer: Self-pay

## 2022-06-26 ENCOUNTER — Ambulatory Visit
Admission: RE | Admit: 2022-06-26 | Discharge: 2022-06-26 | Disposition: A | Discharge status: Home / Self Care | Payer: Medicare HMO | Source: Ambulatory Visit | Attending: Radiation Oncology | Admitting: Radiation Oncology

## 2022-06-26 DIAGNOSIS — R918 Other nonspecific abnormal finding of lung field: Secondary | ICD-10-CM | POA: Insufficient documentation

## 2022-06-26 DIAGNOSIS — Z510 Encounter for antineoplastic radiation therapy: Secondary | ICD-10-CM | POA: Insufficient documentation

## 2022-06-26 LAB — RAD ONC ARIA SESSION SUMMARY
Course Elapsed Days: 0
Plan Fractions Treated to Date: 1
Plan Prescribed Dose Per Fraction: 12 Gy
Plan Total Fractions Prescribed: 5
Plan Total Prescribed Dose: 60 Gy
Reference Point Dosage Given to Date: 12 Gy
Reference Point Session Dosage Given: 12 Gy
Session Number: 1

## 2022-06-28 ENCOUNTER — Ambulatory Visit
Admission: RE | Admit: 2022-06-28 | Discharge: 2022-06-28 | Disposition: A | Discharge status: Home / Self Care | Payer: Medicare HMO | Source: Ambulatory Visit | Attending: Radiation Oncology | Admitting: Radiation Oncology

## 2022-06-28 ENCOUNTER — Other Ambulatory Visit: Payer: Self-pay

## 2022-06-28 DIAGNOSIS — Z510 Encounter for antineoplastic radiation therapy: Secondary | ICD-10-CM | POA: Diagnosis not present

## 2022-06-28 LAB — RAD ONC ARIA SESSION SUMMARY
Course Elapsed Days: 2
Plan Fractions Treated to Date: 2
Plan Prescribed Dose Per Fraction: 12 Gy
Plan Total Fractions Prescribed: 5
Plan Total Prescribed Dose: 60 Gy
Reference Point Dosage Given to Date: 24 Gy
Reference Point Session Dosage Given: 12 Gy
Session Number: 2

## 2022-06-30 ENCOUNTER — Other Ambulatory Visit: Payer: Self-pay | Admitting: Family Medicine

## 2022-06-30 NOTE — Telephone Encounter (Signed)
Requested medication (s) are due for refill today - provider review   Requested medication (s) are on the active medication list -yes  Future visit scheduled -yes  Last refill: 05/30/22 #30 1RF  Notes to clinic: non delegated Rx  Requested Prescriptions  Pending Prescriptions Disp Refills   ALPRAZolam (XANAX) 0.5 MG tablet 30 tablet 1    Sig: Take 1 tablet (0.5 mg total) by mouth 2 (two) times daily as needed for anxiety.     Not Delegated - Psychiatry: Anxiolytics/Hypnotics 2 Failed - 06/30/2022 10:53 AM      Failed - This refill cannot be delegated      Failed - Urine Drug Screen completed in last 360 days      Passed - Patient is not pregnant      Passed - Valid encounter within last 6 months    Recent Outpatient Visits           1 month ago Encounter for annual wellness exam in Medicare patient   [place] [facility] [place], Megan P, DO   3 months ago Wound infection   [place] [place], Megan P, DO   3 months ago Neoplasm of uncertain behavior of skin   [facility], Megan P, DO   4 months ago Routine general medical examination at a health care facility   [facility], Megan P, DO   7 months ago Generalized anxiety disorder   [place] [facility] Dorcas Carrow, DO       Future Appointments             In 2 months Dorcas Carrow, DO [place] [facility]   In 6 months Willeen Niece, MD [place] [facility] [facility]               Requested Prescriptions  Pending Prescriptions Disp Refills   ALPRAZolam (XANAX) 0.5 MG tablet 30 tablet 1    Sig: Take 1 tablet (0.5 mg total) by mouth 2 (two) times daily as needed for anxiety.     Not Delegated - Psychiatry: Anxiolytics/Hypnotics 2 Failed - 06/30/2022 10:53 AM      Failed - This refill cannot be delegated      Failed - Urine Drug Screen completed in last 360  days      Passed - Patient is not pregnant      Passed - Valid encounter within last 6 months    Recent Outpatient Visits           1 month ago Encounter for annual wellness exam in Medicare patient   [facility] [place], Megan P, DO   3 months ago Wound infection   [facility] [place], Megan P, DO   3 months ago Neoplasm of uncertain behavior of skin   [facility] [place], Megan P, DO   4 months ago Routine general medical examination at a health care facility   [facility] [place], Megan P, DO   7 months ago Generalized anxiety disorder    [facility] Dorcas Carrow, DO       Future Appointments             In 2 months [place], Oralia Rud, DO  [facility]   In 6 months Willeen Niece, MD [facility]

## 2022-06-30 NOTE — Telephone Encounter (Unsigned)
Copied from CRM [id number]. Topic: General - Other >> Jun 30, 2022  9:45 AM Everette C wrote: Reason for CRM: Medication Refill - Medication: ALPRAZolam (XANAX) 0.5 MG tablet [[id number]]  Has the patient contacted their pharmacy? Yes.   (Agent: If no, request that the patient contact the pharmacy for the refill. If patient does not wish to contact the pharmacy document the reason why and proceed with request.) (Agent: If yes, when and what did the pharmacy advise?)  Preferred Pharmacy (with phone number or street name): [id number] - [place], [place] - [address] [place] [address] Phone: [phone] Fax: [phone] Hours: Not open 24 hours   Has the patient been seen for an appointment in the last year OR does the patient have an upcoming appointment? Yes.    Agent: Please be advised that RX refills may take up to 3 business days. We ask that you follow-up with your pharmacy.

## 2022-07-03 ENCOUNTER — Other Ambulatory Visit: Payer: Self-pay

## 2022-07-03 ENCOUNTER — Ambulatory Visit
Admission: RE | Admit: 2022-07-03 | Discharge: 2022-07-03 | Disposition: A | Discharge status: Home / Self Care | Payer: Medicare HMO | Source: Ambulatory Visit | Attending: Radiation Oncology | Admitting: Radiation Oncology

## 2022-07-03 DIAGNOSIS — Z510 Encounter for antineoplastic radiation therapy: Secondary | ICD-10-CM | POA: Diagnosis not present

## 2022-07-03 LAB — RAD ONC ARIA SESSION SUMMARY
Course Elapsed Days: 7
Plan Fractions Treated to Date: 3
Plan Prescribed Dose Per Fraction: 12 Gy
Plan Total Fractions Prescribed: 5
Plan Total Prescribed Dose: 60 Gy
Reference Point Dosage Given to Date: 36 Gy
Reference Point Session Dosage Given: 12 Gy
Session Number: 3

## 2022-07-03 NOTE — Telephone Encounter (Signed)
Spoke with patient's local pharmacy and was informed they must be a delay in the system as the patient's requested prescription is ready for pick up.

## 2022-07-05 ENCOUNTER — Other Ambulatory Visit: Payer: Self-pay

## 2022-07-05 ENCOUNTER — Ambulatory Visit
Admission: RE | Admit: 2022-07-05 | Discharge: 2022-07-05 | Disposition: A | Discharge status: Home / Self Care | Payer: Medicare HMO | Source: Ambulatory Visit | Attending: Radiation Oncology | Admitting: Radiation Oncology

## 2022-07-05 DIAGNOSIS — Z510 Encounter for antineoplastic radiation therapy: Secondary | ICD-10-CM | POA: Diagnosis not present

## 2022-07-05 LAB — RAD ONC ARIA SESSION SUMMARY
Course Elapsed Days: 9
Plan Fractions Treated to Date: 4
Plan Prescribed Dose Per Fraction: 12 Gy
Plan Total Fractions Prescribed: 5
Plan Total Prescribed Dose: 60 Gy
Reference Point Dosage Given to Date: 48 Gy
Reference Point Session Dosage Given: 12 Gy
Session Number: 4

## 2022-07-10 ENCOUNTER — Ambulatory Visit
Admission: RE | Admit: 2022-07-10 | Discharge: 2022-07-10 | Disposition: A | Discharge status: Home / Self Care | Payer: Medicare HMO | Source: Ambulatory Visit | Attending: Radiation Oncology | Admitting: Radiation Oncology

## 2022-07-10 ENCOUNTER — Other Ambulatory Visit: Payer: Self-pay

## 2022-07-10 DIAGNOSIS — Z510 Encounter for antineoplastic radiation therapy: Secondary | ICD-10-CM | POA: Diagnosis not present

## 2022-07-10 LAB — RAD ONC ARIA SESSION SUMMARY
Course Elapsed Days: 14
Plan Fractions Treated to Date: 5
Plan Prescribed Dose Per Fraction: 12 Gy
Plan Total Fractions Prescribed: 5
Plan Total Prescribed Dose: 60 Gy
Reference Point Dosage Given to Date: 60 Gy
Reference Point Session Dosage Given: 12 Gy
Session Number: 5

## 2022-08-09 ENCOUNTER — Ambulatory Visit: Payer: Medicare HMO | Admitting: Radiation Oncology

## 2022-08-16 ENCOUNTER — Ambulatory Visit
Admission: RE | Admit: 2022-08-16 | Discharge: 2022-08-16 | Disposition: A | Discharge status: Home / Self Care | Payer: Medicare HMO | Source: Ambulatory Visit | Attending: Radiation Oncology | Admitting: Radiation Oncology

## 2022-08-16 ENCOUNTER — Encounter: Payer: Self-pay | Admitting: Radiation Oncology

## 2022-08-16 VITALS — BP 133/78 | HR 87 | Temp 97.4°F | Resp 16 | Wt 142.7 lb

## 2022-08-16 DIAGNOSIS — C3490 Malignant neoplasm of unspecified part of unspecified bronchus or lung: Secondary | ICD-10-CM | POA: Diagnosis present

## 2022-08-16 NOTE — Progress Notes (Signed)
Radiation Oncology Follow up Note  Name: Maureen [name]   Date:   08/16/2022 MRN:  [id number] DOB: [birth year]    This 77 y.o. female presents to the clinic today for 1 month follow-up status post SBRT treatment to her left upper lobe for presumed stage I non-small cell lung cancer.  REFERRING PROVIDER: Dorcas Carrow, DO  HPI: Patient is a 77 year old female now out 1 month having completed SBRT to left upper lobe for presumed stage I non-small cell lung cancer.  Seen today in routine follow-up she is doing well.  She specifically denies cough hemoptysis or any change in her pulmonary status..  COMPLICATIONS OF TREATMENT: none  FOLLOW UP COMPLIANCE: keeps appointments   PHYSICAL EXAM:  BP 133/78   Pulse 87   Temp (!) 97.4 F (36.3 C) (Tympanic)   Resp 16   Wt 142 lb 11.2 oz (64.7 kg)   BMI 25.28 kg/m  Well-developed well-nourished patient in NAD. HEENT reveals PERLA, EOMI, discs not visualized.  Oral cavity is clear. No oral mucosal lesions are identified. Neck is clear without evidence of cervical or supraclavicular adenopathy. Lungs are clear to A&P. Cardiac examination is essentially unremarkable with regular rate and rhythm without murmur rub or thrill. Abdomen is benign with no organomegaly or masses noted. Motor sensory and DTR levels are equal and symmetric in the upper and lower extremities. Cranial nerves II through XII are grossly intact. Proprioception is intact. No peripheral adenopathy or edema is identified. No motor or sensory levels are noted. Crude visual fields are within normal range.  RADIOLOGY RESULTS: CT scan of her chest ordered in 3 months  PLAN: At the present time patient is doing well with low side effect profile from SBRT.  I have asked to see her back in 3 months with a follow-up CT scan at that time.  Patient knows to call with any concerns. I would like to take this opportunity to thank you for allowing me to participate in the care of your patient.Carmina Miller, MD

## 2022-08-29 ENCOUNTER — Ambulatory Visit: Payer: Medicare HMO | Admitting: Family Medicine

## 2022-08-29 ENCOUNTER — Encounter: Payer: Self-pay | Admitting: Family Medicine

## 2022-08-29 VITALS — BP 109/71 | HR 84 | Ht 63.0 in | Wt 145.2 lb

## 2022-08-29 DIAGNOSIS — N838 Other noninflammatory disorders of ovary, fallopian tube and broad ligament: Secondary | ICD-10-CM

## 2022-08-29 DIAGNOSIS — F411 Generalized anxiety disorder: Secondary | ICD-10-CM | POA: Diagnosis not present

## 2022-08-29 DIAGNOSIS — E559 Vitamin D deficiency, unspecified: Secondary | ICD-10-CM | POA: Diagnosis not present

## 2022-08-29 DIAGNOSIS — E782 Mixed hyperlipidemia: Secondary | ICD-10-CM | POA: Diagnosis not present

## 2022-08-29 DIAGNOSIS — M329 Systemic lupus erythematosus, unspecified: Secondary | ICD-10-CM

## 2022-08-29 MED ORDER — OMEPRAZOLE 20 MG PO CPDR: 20.0000 mg | DELAYED_RELEASE_CAPSULE | Freq: Every day | ORAL | 1 refills | Status: DC

## 2022-08-29 MED ORDER — ALPRAZOLAM 0.5 MG PO TABS: 0.5000 mg | ORAL_TABLET | Freq: Two times a day (BID) | ORAL | 2 refills | Status: DC | PRN

## 2022-08-29 MED ORDER — ALBUTEROL SULFATE HFA 108 (90 BASE) MCG/ACT IN AERS: 1.0000 | INHALATION_SPRAY | Freq: Four times a day (QID) | RESPIRATORY_TRACT | 6 refills | Status: DC | PRN

## 2022-08-29 MED ORDER — MONTELUKAST SODIUM 10 MG PO TABS: ORAL_TABLET | ORAL | 1 refills | Status: DC

## 2022-08-29 NOTE — Assessment & Plan Note (Signed)
Stable. Continue to monitor. Continue to follow with rheumatology. 

## 2022-08-29 NOTE — Assessment & Plan Note (Signed)
Rechecking labs today. Await results. Treat as needed.  °

## 2022-08-29 NOTE — Progress Notes (Signed)
BP 109/71   Pulse 84   Ht 5\' 3"  (1.6 m)   Wt 145 lb 3.2 oz (65.9 kg)   SpO2 93%   BMI 25.72 kg/m    Subjective:    Patient ID: Maureen [name], female    DOB: 1945/06/14, 77 y.o.   MRN: [id number]  HPI: Maureen [name] is a 77 y.o. 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 She is very anxious about that.   ANXIETY/STRESS Duration: chronic Status:exacerbated Anxious mood: yes  Excessive worrying: yes Irritability: no  Sweating: no Nausea: no Palpitations:no Hyperventilation: no Panic attacks: no Agoraphobia: no  Obscessions/compulsions: no Depressed mood: no    05/30/2022   12:10 PM 02/08/2022   10:25 AM 11/08/2021    2:53 PM 08/08/2021    2:01 PM 01/19/2021    9:21 AM  Depression screen PHQ 2/9  Decreased Interest 0 0 1 0 0  Down, Depressed, Hopeless 0 0 2 0 0  PHQ - 2 Score 0 0 3 0 0  Altered sleeping 0 0 1 1 0  Tired, decreased energy 0 0 2 1 0  Change in appetite 0 0 2 1 1   Feeling bad or failure about yourself  0 0 0 1 0  Trouble concentrating 0 0 1 1 0  Moving slowly or fidgety/restless 0 0 0 0 0  Suicidal thoughts 0 0 0 0 0  PHQ-9 Score 0 0 9 5 1   Difficult doing work/chores Not difficult at all    Not difficult at all   Anhedonia: no Weight changes: no Insomnia: no   Hypersomnia: no Fatigue/loss of energy: no Feelings of worthlessness: no Feelings of guilt: no Impaired concentration/indecisiveness: no Suicidal ideations: no  Crying spells: no Recent Stressors/Life Changes: yes   Relationship problems: no   Family stress: no     Financial stress: no    Job stress: no    Recent death/loss: no  HYPERLIPIDEMIA Hyperlipidemia status: excellent compliance Satisfied with current treatment?  yes Side effects:  no Medication compliance: excellent compliance Past cholesterol meds: crestor Supplements: none Aspirin:  no The 10-year ASCVD risk  score (Arnett DK, et al., 2019) is: 18.5%   Values used to calculate the score:     Age: 19 years     Sex: Female     Is Non-Hispanic African American: No     Diabetic: No     Tobacco smoker: Yes     Systolic Blood Pressure: 109 mmHg     Is BP treated: No     HDL Cholesterol: 42 mg/dL     Total Cholesterol: 237 mg/dL Chest pain:  no Coronary artery disease:  no   Relevant past medical, surgical, family and social history reviewed and updated as indicated. Interim medical history since our last visit reviewed. Allergies and medications reviewed and updated.  Review of Systems  Constitutional: Negative.   Respiratory: Negative.    Cardiovascular: Negative.   Gastrointestinal: Negative.   Musculoskeletal: Negative.   Psychiatric/Behavioral: Negative.      Per HPI unless specifically indicated above     Objective:    BP 109/71   Pulse 84   Ht 5\' 3"  (1.6 m)   Wt 145 lb 3.2 oz (65.9 kg)   SpO2 93%   BMI 25.72 kg/m   Wt Readings from Last 3 Encounters:  08/29/22 145 lb 3.2 oz (  65.9 kg)  08/16/22 142 lb 11.2 oz (64.7 kg)  06/12/22 143 lb (64.9 kg)    Physical Exam Vitals and nursing note reviewed.  Constitutional:      General: She is not in acute distress.    Appearance: Normal appearance. She is not ill-appearing, toxic-appearing or diaphoretic.  HENT:     Head: Normocephalic and atraumatic.     Right Ear: External ear normal.     Left Ear: External ear normal.     Nose: Nose normal.     Mouth/Throat:     Mouth: Mucous membranes are moist.     Pharynx: Oropharynx is clear.  Eyes:     General: No scleral icterus.       Right eye: No discharge.        Left eye: No discharge.     Extraocular Movements: Extraocular movements intact.     Conjunctiva/sclera: Conjunctivae normal.     Pupils: Pupils are equal, round, and reactive to light.  Cardiovascular:     Rate and Rhythm: Normal rate and regular rhythm.     Pulses: Normal pulses.     Heart sounds: Normal  heart sounds. No murmur heard.    No friction rub. No gallop.  Pulmonary:     Effort: Pulmonary effort is normal. No respiratory distress.     Breath sounds: Normal breath sounds. No stridor. No wheezing, rhonchi or rales.  Chest:     Chest wall: No tenderness.  Musculoskeletal:        General: Normal range of motion.     Cervical back: Normal range of motion and neck supple.  Skin:    General: Skin is warm and dry.     Capillary Refill: Capillary refill takes less than 2 seconds.     Coloration: Skin is not jaundiced or pale.     Findings: No bruising, erythema, lesion or rash.  Neurological:     General: No focal deficit present.     Mental Status: She is alert and oriented to person, place, and time. Mental status is at baseline.  Psychiatric:        Mood and Affect: Mood normal.        Behavior: Behavior normal.        Thought Content: Thought content normal.        Judgment: Judgment normal.     Results for orders placed or performed in visit on 07/10/22  Rad Onc Aria Session Summary  Result Value Ref Range   Course ID [email or web address]    Course Intent Unknown    Course Start Date 06/14/2022  2:26 PM    Session Number 5    Course First Treatment Date 06/26/2022 11:21 AM    Course Last Treatment Date 07/10/2022 11:44 AM    Course Elapsed Days 14    Reference Point ID Lung_[email or web address]    Reference Point Dosage Given to Date 60 Gy   Reference Point Session Dosage Given 12 Gy   Plan ID Lung_[email or web address]    Plan Fractions Treated to Date 5    Plan Total Fractions Prescribed 5    Plan Prescribed Dose Per Fraction 12 Gy   Plan Total Prescribed Dose 60.000000 Gy   Plan Primary Reference Point Lung_[email or web address]       Assessment & Plan:   Problem List Items Addressed This Visit       Other   Lupus (HCC)    Stable. Continue to monitor. Continue to follow with rheumatology.  Generalized anxiety disorder    In exacerbation due to her medical issues going on. Continue current regimen.  Continue to monitor. Call with any concerns. Refills given for 3 months. Follow up 3 months.        Relevant Medications   ALPRAZolam (XANAX) 0.5 MG tablet   Hyperlipidemia - Primary    Under good control on current regimen. Continue current regimen. Continue to monitor. Call with any concerns. Refills through cardiology. Labs drawn today.        Relevant Medications   aspirin 81 MG chewable tablet   Other Relevant Orders   Lipid Panel w/o Chol/HDL Ratio   Comprehensive metabolic panel   Vitamin D deficiency    Rechecking labs today. Await results. Treat as needed.       Relevant Orders   VITAMIN D 25 Hydroxy (Vit-D Deficiency, Fractures)   Bilateral tubo-ovarian mass   Relevant Medications   omeprazole (PRILOSEC) 20 MG capsule     Follow up plan: Return in about 3 months (around 11/29/2022).

## 2022-08-29 NOTE — Assessment & Plan Note (Addendum)
Under good control on current regimen. Continue current regimen. Continue to monitor. Call with any concerns. Refills through cardiology. Labs drawn today.  

## 2022-08-29 NOTE — Assessment & Plan Note (Addendum)
In exacerbation due to her medical issues going on. Continue current regimen. Continue to monitor. Call with any concerns. Refills given for 3 months. Follow up 3 months.

## 2022-08-30 LAB — COMPREHENSIVE METABOLIC PANEL
ALT: 17 IU/L (ref 0–32)
AST: 14 IU/L (ref 0–40)
Albumin/Globulin Ratio: 2.1
Albumin: 4.4 g/dL (ref 3.8–4.8)
Alkaline Phosphatase: 132 IU/L — ABNORMAL HIGH (ref 44–121)
BUN/Creatinine Ratio: 13 (ref 12–28)
BUN: 16 mg/dL (ref 8–27)
Bilirubin Total: <0.2 mg/dL (ref 0.0–1.2)
CO2: 23 mmol/L (ref 20–29)
Calcium: 10.4 mg/dL — ABNORMAL HIGH (ref 8.7–10.3)
Chloride: 104 mmol/L (ref 96–106)
Creatinine, Ser: 1.2 mg/dL — ABNORMAL HIGH (ref 0.57–1.00)
Globulin, Total: 2.1 g/dL (ref 1.5–4.5)
Glucose: 105 mg/dL — ABNORMAL HIGH (ref 70–99)
Potassium: 4.3 mmol/L (ref 3.5–5.2)
Sodium: 141 mmol/L (ref 134–144)
Total Protein: 6.5 g/dL (ref 6.0–8.5)
eGFR: 47 mL/min/{1.73_m2} — ABNORMAL LOW (ref >59)

## 2022-08-30 LAB — LIPID PANEL W/O CHOL/HDL RATIO
Cholesterol, Total: 155 mg/dL (ref 100–199)
HDL: 50 mg/dL (ref >39)
LDL Chol Calc (NIH): 83 mg/dL (ref 0–99)
Triglycerides: 121 mg/dL (ref 0–149)
VLDL Cholesterol Cal: 22 mg/dL (ref 5–40)

## 2022-08-30 LAB — VITAMIN D 25 HYDROXY (VIT D DEFICIENCY, FRACTURES): Vit D, 25-Hydroxy: 24.2 ng/mL — ABNORMAL LOW (ref 30.0–100.0)

## 2022-11-15 ENCOUNTER — Ambulatory Visit
Admission: RE | Admit: 2022-11-15 | Discharge: 2022-11-15 | Disposition: A | Discharge status: Home / Self Care | Payer: Medicare HMO | Source: Ambulatory Visit | Attending: Radiation Oncology | Admitting: Radiation Oncology

## 2022-11-15 DIAGNOSIS — C3490 Malignant neoplasm of unspecified part of unspecified bronchus or lung: Secondary | ICD-10-CM | POA: Insufficient documentation

## 2022-11-15 LAB — POCT I-STAT CREATININE: Creatinine, Ser: 1.3 mg/dL — ABNORMAL HIGH (ref 0.44–1.00)

## 2022-11-15 MED ORDER — IOHEXOL 300 MG/ML  SOLN
60.0000 mL | Freq: Once | INTRAMUSCULAR | Status: AC | PRN
  Administered 2022-11-15: 60 mL via INTRAVENOUS

## 2022-11-22 ENCOUNTER — Other Ambulatory Visit: Payer: Self-pay | Admitting: *Deleted

## 2022-11-22 ENCOUNTER — Ambulatory Visit
Admission: RE | Admit: 2022-11-22 | Discharge: 2022-11-22 | Disposition: A | Discharge status: Home / Self Care | Payer: Medicare HMO | Source: Ambulatory Visit | Attending: Radiation Oncology | Admitting: Radiation Oncology

## 2022-11-22 ENCOUNTER — Encounter: Payer: Self-pay | Admitting: Radiation Oncology

## 2022-11-22 VITALS — BP 143/79 | HR 84 | Temp 97.3°F | Resp 16 | Wt 150.0 lb

## 2022-11-22 DIAGNOSIS — C3490 Malignant neoplasm of unspecified part of unspecified bronchus or lung: Secondary | ICD-10-CM

## 2022-11-22 DIAGNOSIS — Z923 Personal history of irradiation: Secondary | ICD-10-CM | POA: Insufficient documentation

## 2022-11-22 DIAGNOSIS — R918 Other nonspecific abnormal finding of lung field: Secondary | ICD-10-CM | POA: Diagnosis present

## 2022-11-22 MED ORDER — KETOCONAZOLE 2 % EX CREA: 1.0000 | TOPICAL_CREAM | Freq: Every day | CUTANEOUS | 0 refills | Status: DC

## 2022-11-22 NOTE — Progress Notes (Signed)
Radiation Oncology Follow up Note  Name: Maureen [name]   Date:   11/22/2022 MRN:  [id number] DOB: 19-Jul-1945    This 77 y.o. female presents to the clinic today for 20-month follow-up status post SBRT to her left upper lobe for presumed stage I non-small cell lung cancer.  REFERRING PROVIDER: Dorcas Carrow, DO  HPI: Patient is a 77 year old female now out for months having completed SBRT to her left upper lobe.  Seen today in routine follow-up she is doing well she has a little fungal infection of her left ear for which I am prescribing ketoconazole cream.  She otherwise specifically denies exacerbation of cough any dysphagia hemoptysis or change in pulmonary status.  She had a recent CT scan which on my review since it has not been formally read shows no evidence of disease..  COMPLICATIONS OF TREATMENT: none  FOLLOW UP COMPLIANCE: keeps appointments   PHYSICAL EXAM:  BP (!) 143/79 Comment: BP slightly elevated patient advised to contact PCP if it remains elvated  Pulse 84   Temp (!) 97.3 F (36.3 C) (Tympanic)   Resp 16   Wt 150 lb (68 kg)   BMI 26.57 kg/m  Well-developed well-nourished patient in NAD. HEENT reveals PERLA, EOMI, discs not visualized.  Oral cavity is clear. No oral mucosal lesions are identified. Neck is clear without evidence of cervical or supraclavicular adenopathy. Lungs are clear to A&P. Cardiac examination is essentially unremarkable with regular rate and rhythm without murmur rub or thrill. Abdomen is benign with no organomegaly or masses noted. Motor sensory and DTR levels are equal and symmetric in the upper and lower extremities. Cranial nerves II through XII are grossly intact. Proprioception is intact. No peripheral adenopathy or edema is identified. No motor or sensory levels are noted. Crude visual fields are within normal range.  RADIOLOGY RESULTS: CT scan reviewed compatible with above-stated findings  PLAN: Present time patient is doing well  excellent response by CT criteria of asked to see her back in 6 months for follow-up with repeat CT scan at that time.  I am starting on ketoconazole cream for her ear fungus.  Patient is to call with any concerns.  I would like to take this opportunity to thank you for allowing me to participate in the care of your patient.Carmina Miller, MD

## 2022-11-23 LAB — LIPID PANEL: Triglycerides: 111 mg/dL (ref 0–149)

## 2022-11-29 ENCOUNTER — Encounter: Payer: Self-pay | Admitting: Family Medicine

## 2022-11-29 ENCOUNTER — Ambulatory Visit (INDEPENDENT_AMBULATORY_CARE_PROVIDER_SITE_OTHER): Payer: Medicare HMO | Admitting: Family Medicine

## 2022-11-29 VITALS — BP 119/76 | HR 77 | Ht 63.0 in | Wt 149.8 lb

## 2022-11-29 DIAGNOSIS — F411 Generalized anxiety disorder: Secondary | ICD-10-CM

## 2022-11-29 DIAGNOSIS — Z23 Encounter for immunization: Secondary | ICD-10-CM

## 2022-11-29 MED ORDER — CLONAZEPAM 0.5 MG PO TABS: 0.5000 mg | ORAL_TABLET | Freq: Two times a day (BID) | ORAL | 0 refills | Status: DC

## 2022-11-29 NOTE — Progress Notes (Signed)
BP 119/76   Pulse 77   Ht 5\' 3"  (1.6 m)   Wt 149 lb 12.8 oz (67.9 kg)   SpO2 97%   BMI 26.54 kg/m    Subjective:    Patient ID: Maureen [name], female    DOB: 1946/02/05, 77 y.o.   MRN: [id number]  HPI: Maureen [name] is a 77 y.o. female  Chief Complaint  Patient presents with   Anxiety   ANXIETY/STRESS Duration: chronic, but much worse Status:exacerbated Anxious mood: yes  Excessive worrying: yes Irritability: yes  Sweating: yes Nausea: yes Palpitations:yes Hyperventilation: no Panic attacks: no Agoraphobia: no  Obscessions/compulsions: yes Depressed mood: no    05/30/2022   12:10 PM 02/08/2022   10:25 AM 11/08/2021    2:53 PM 08/08/2021    2:01 PM 01/19/2021    9:21 AM  Depression screen PHQ 2/9  Decreased Interest 0 0 1 0 0  Down, Depressed, Hopeless 0 0 2 0 0  PHQ - 2 Score 0 0 3 0 0  Altered sleeping 0 0 1 1 0  Tired, decreased energy 0 0 2 1 0  Change in appetite 0 0 2 1 1   Feeling bad or failure about yourself  0 0 0 1 0  Trouble concentrating 0 0 1 1 0  Moving slowly or fidgety/restless 0 0 0 0 0  Suicidal thoughts 0 0 0 0 0  PHQ-9 Score 0 0 9 5 1   Difficult doing work/chores Not difficult at all    Not difficult at all      02/08/2022   10:25 AM 11/08/2021    2:53 PM 08/08/2021    2:01 PM 04/21/2021    8:57 AM  GAD 7 : Generalized Anxiety Score  Nervous, Anxious, on Edge 0 2 1 1   Control/stop worrying 0 2 1 0  Worry too much - different things 0 2 1 1   Trouble relaxing 0 2 0 0  Restless 0 2 1 0  Easily annoyed or irritable 0 1 1 0  Afraid - awful might happen 0 0 1 1  Total GAD 7 Score 0 11 6 3   Anxiety Difficulty Not difficult at all  Not difficult at all Not difficult at all   Anhedonia: no Weight changes: no Insomnia: no   Hypersomnia: no Fatigue/loss of energy: yes Feelings of worthlessness: no Feelings of guilt: no Impaired concentration/indecisiveness: no Suicidal ideations: no  Crying spells: no Recent Stressors/Life  Changes: yes   Relationship problems: no   Family stress: no     Financial stress: no    Job stress: no    Recent death/loss: no   Relevant past medical, surgical, family and social history reviewed and updated as indicated. Interim medical history since our last visit reviewed. Allergies and medications reviewed and updated.  Review of Systems  Constitutional: Negative.   Respiratory: Negative.    Cardiovascular: Negative.   Gastrointestinal: Negative.   Musculoskeletal: Negative.   Neurological: Negative.   Psychiatric/Behavioral:  Positive for agitation. Negative for behavioral problems, confusion, decreased concentration, dysphoric mood, hallucinations, self-injury, sleep disturbance and suicidal ideas. The patient is nervous/anxious. The patient is not hyperactive.     Per HPI unless specifically indicated above     Objective:    BP 119/76   Pulse 77   Ht 5\' 3"  (1.6 m)   Wt 149 lb 12.8 oz (67.9 kg)   SpO2 97%   BMI 26.54 kg/m   Wt Readings from Last 3 Encounters:  11/29/22  149 lb 12.8 oz (67.9 kg)  11/22/22 150 lb (68 kg)  08/29/22 145 lb 3.2 oz (65.9 kg)    Physical Exam Vitals and nursing note reviewed.  Constitutional:      General: She is not in acute distress.    Appearance: Normal appearance. She is not ill-appearing, toxic-appearing or diaphoretic.  HENT:     Head: Normocephalic and atraumatic.     Right Ear: External ear normal.     Left Ear: External ear normal.     Nose: Nose normal.     Mouth/Throat:     Mouth: Mucous membranes are moist.     Pharynx: Oropharynx is clear.  Eyes:     General: No scleral icterus.       Right eye: No discharge.        Left eye: No discharge.     Extraocular Movements: Extraocular movements intact.     Conjunctiva/sclera: Conjunctivae normal.     Pupils: Pupils are equal, round, and reactive to light.  Cardiovascular:     Rate and Rhythm: Normal rate and regular rhythm.     Pulses: Normal pulses.     Heart  sounds: Normal heart sounds. No murmur heard.    No friction rub. No gallop.  Pulmonary:     Effort: Pulmonary effort is normal. No respiratory distress.     Breath sounds: Normal breath sounds. No stridor. No wheezing, rhonchi or rales.  Chest:     Chest wall: No tenderness.  Musculoskeletal:        General: Normal range of motion.     Cervical back: Normal range of motion and neck supple.  Skin:    General: Skin is warm and dry.     Capillary Refill: Capillary refill takes less than 2 seconds.     Coloration: Skin is not jaundiced or pale.     Findings: No bruising, erythema, lesion or rash.  Neurological:     General: No focal deficit present.     Mental Status: She is alert and oriented to person, place, and time. Mental status is at baseline.  Psychiatric:        Mood and Affect: Mood normal.        Behavior: Behavior normal.        Thought Content: Thought content normal.        Judgment: Judgment normal.     Results for orders placed or performed during the hospital encounter of 11/15/22  I-STAT creatinine  Result Value Ref Range   Creatinine, Ser 1.30 (H) 0.44 - 1.00 mg/dL      Assessment & Plan:   Problem List Items Addressed This Visit       Other   Generalized anxiety disorder    Continues to be exacerbated while waiting for her vascular surgery. Will change her to BID klonopin for better control on her anxiety and recheck in 3-4 weeks. Call with any concerns.       Other Visit Diagnoses     Flu vaccine need    -  Primary   Relevant Orders   Flu Vaccine Trivalent High Dose (Fluad) (Completed)        Follow up plan: Return 3-4 weeks, OK to double book if needed.

## 2022-11-29 NOTE — Assessment & Plan Note (Signed)
Continues to be exacerbated while waiting for her vascular surgery. Will change her to BID klonopin for better control on her anxiety and recheck in 3-4 weeks. Call with any concerns.

## 2022-12-11 ENCOUNTER — Telehealth: Payer: Self-pay | Admitting: *Deleted

## 2022-12-11 DIAGNOSIS — E782 Mixed hyperlipidemia: Secondary | ICD-10-CM

## 2022-12-11 MED ORDER — EZETIMIBE 10 MG PO TABS
10.0000 mg | ORAL_TABLET | Freq: Every day | ORAL | 3 refills | Status: AC
Start: 2022-12-11 — End: ?

## 2022-12-11 NOTE — Telephone Encounter (Signed)
Spoke with pt, she reports her muscle pain is completely gone. She is willing to try zetia. New script sent to the pharmacy and Lab orders mailed to the pt

## 2022-12-11 NOTE — Telephone Encounter (Signed)
-----   Message from Olga Millers sent at 11/27/2022  7:37 AM EDT ----- If she is having myalgias would discontinue Crestor and treat with Zetia 10 mg daily.  Check lipids and liver in 8 weeks. Olga Millers ----- Message ----- From: Lindell Spar, RN Sent: 11/24/2022   2:33 PM EDT To: Lewayne Bunting, MD  Spoke with patient. She reports dietary indiscretions over the last several months due to other health issues and not being as active. She also reports myalgias with the statin.  Routed to MD to review

## 2022-12-27 ENCOUNTER — Ambulatory Visit (INDEPENDENT_AMBULATORY_CARE_PROVIDER_SITE_OTHER): Payer: Medicare HMO | Admitting: Family Medicine

## 2022-12-27 ENCOUNTER — Encounter: Payer: Self-pay | Admitting: Family Medicine

## 2022-12-27 VITALS — BP 107/71 | HR 88 | Ht 63.0 in | Wt 149.0 lb

## 2022-12-27 DIAGNOSIS — N3001 Acute cystitis with hematuria: Secondary | ICD-10-CM

## 2022-12-27 DIAGNOSIS — R8290 Unspecified abnormal findings in urine: Secondary | ICD-10-CM | POA: Diagnosis not present

## 2022-12-27 DIAGNOSIS — F411 Generalized anxiety disorder: Secondary | ICD-10-CM

## 2022-12-27 LAB — WET PREP FOR TRICH, YEAST, CLUE
Clue Cell Exam: NEGATIVE
Trichomonas Exam: NEGATIVE
Yeast Exam: NEGATIVE

## 2022-12-27 LAB — URINALYSIS, ROUTINE W REFLEX MICROSCOPIC
Bilirubin, UA: NEGATIVE
Glucose, UA: NEGATIVE
Ketones, UA: NEGATIVE
Nitrite, UA: POSITIVE — AB
Protein,UA: NEGATIVE
Specific Gravity, UA: 1.025 (ref 1.005–1.030)
Urobilinogen, Ur: 0.2 mg/dL (ref 0.2–1.0)
pH, UA: 6 (ref 5.0–7.5)

## 2022-12-27 LAB — MICROSCOPIC EXAMINATION

## 2022-12-27 MED ORDER — CLONAZEPAM 0.5 MG PO TABS: 0.5000 mg | ORAL_TABLET | Freq: Two times a day (BID) | ORAL | 1 refills | Status: DC

## 2022-12-27 MED ORDER — NITROFURANTOIN MONOHYD MACRO 100 MG PO CAPS: 100.0000 mg | ORAL_CAPSULE | Freq: Two times a day (BID) | ORAL | 0 refills | Status: DC

## 2022-12-27 NOTE — Assessment & Plan Note (Signed)
Anxiety still very high. She can take between 1-2 pills of her klonopin BID until after her surgery. Recheck about 4-6 weeks. Call with any concerns.

## 2022-12-27 NOTE — Progress Notes (Signed)
BP 107/71 (BP Location: Left Arm, Cuff Size: Normal)   Pulse 88   Ht 5\' 3"  (1.6 m)   Wt 149 lb (67.6 kg)   SpO2 96%   BMI 26.39 kg/m    Subjective:    Patient ID: Maureen [name], female    DOB: 1945/09/28, 77 y.o.   MRN: [id number]  HPI: Maureen [name] is a 76 y.o. female  Chief Complaint  Patient presents with   Anxiety    Patient says she feels like she is sleeping less on the new prescription and would like to discuss with provider.   ANXIETY/STRESS Duration: chronic, worse Status:exacerbated Anxious mood: yes  Excessive worrying: yes Irritability: no  Sweating: no Nausea: no Palpitations:yes Hyperventilation: yes Panic attacks: yes- having panic attacks about 2x a week Agoraphobia: no  Obscessions/compulsions: yes Depressed mood: yes    05/30/2022   12:10 PM 02/08/2022   10:25 AM 11/08/2021    2:53 PM 08/08/2021    2:01 PM 01/19/2021    9:21 AM  Depression screen PHQ 2/9  Decreased Interest 0 0 1 0 0  Down, Depressed, Hopeless 0 0 2 0 0  PHQ - 2 Score 0 0 3 0 0  Altered sleeping 0 0 1 1 0  Tired, decreased energy 0 0 2 1 0  Change in appetite 0 0 2 1 1   Feeling bad or failure about yourself  0 0 0 1 0  Trouble concentrating 0 0 1 1 0  Moving slowly or fidgety/restless 0 0 0 0 0  Suicidal thoughts 0 0 0 0 0  PHQ-9 Score 0 0 9 5 1   Difficult doing work/chores Not difficult at all    Not difficult at all   Anhedonia: no Weight changes: no Insomnia: yes hard to stay asleep  Hypersomnia: no Fatigue/loss of energy: yes Feelings of worthlessness: no Feelings of guilt: no Impaired concentration/indecisiveness: no Suicidal ideations: no  Crying spells: no Recent Stressors/Life Changes: no   Relationship problems: no   Family stress: no     Financial stress: no    Job stress: no    Recent death/loss: no  URINARY SYMPTOMS Duration: a couple of weeks Dysuria: no Urinary frequency: no Urgency: yes Small volume voids: yes Symptom severity:  moderate Urinary incontinence: no Foul odor: yes Hematuria: no Abdominal pain: yes Back pain: no Suprapubic pain/pressure: yes Flank pain: no Fever:  no Vomiting: no Relief with cranberry juice: no Relief with pyridium: no Status: stable Previous urinary tract infection: yes Recurrent urinary tract infection: no History of sexually transmitted disease: no Vaginal discharge: no Treatments attempted: none    Relevant past medical, surgical, family and social history reviewed and updated as indicated. Interim medical history since our last visit reviewed. Allergies and medications reviewed and updated.  Review of Systems  Constitutional: Negative.   Respiratory: Negative.    Cardiovascular: Negative.   Genitourinary:  Positive for pelvic pain and urgency. Negative for decreased urine volume, difficulty urinating, dyspareunia, dysuria, enuresis, flank pain, frequency, genital sores, hematuria, menstrual problem, vaginal bleeding, vaginal discharge and vaginal pain.  Musculoskeletal: Negative.   Psychiatric/Behavioral:  Positive for sleep disturbance. Negative for agitation, behavioral problems, confusion, decreased concentration, dysphoric mood, hallucinations, self-injury and suicidal ideas. The patient is nervous/anxious. The patient is not hyperactive.     Per HPI unless specifically indicated above     Objective:    BP 107/71 (BP Location: Left Arm, Cuff Size: Normal)   Pulse 88   Ht 5'  3" (1.6 m)   Wt 149 lb (67.6 kg)   SpO2 96%   BMI 26.39 kg/m   Wt Readings from Last 3 Encounters:  12/27/22 149 lb (67.6 kg)  11/29/22 149 lb 12.8 oz (67.9 kg)  11/22/22 150 lb (68 kg)    Physical Exam Vitals and nursing note reviewed.  Constitutional:      General: She is not in acute distress.    Appearance: Normal appearance. She is not ill-appearing, toxic-appearing or diaphoretic.  HENT:     Head: Normocephalic and atraumatic.     Right Ear: External ear normal.     Left  Ear: External ear normal.     Nose: Nose normal.     Mouth/Throat:     Mouth: Mucous membranes are moist.     Pharynx: Oropharynx is clear.  Eyes:     General: No scleral icterus.       Right eye: No discharge.        Left eye: No discharge.     Extraocular Movements: Extraocular movements intact.     Conjunctiva/sclera: Conjunctivae normal.     Pupils: Pupils are equal, round, and reactive to light.  Cardiovascular:     Rate and Rhythm: Normal rate and regular rhythm.     Pulses: Normal pulses.     Heart sounds: Normal heart sounds. No murmur heard.    No friction rub. No gallop.  Pulmonary:     Effort: Pulmonary effort is normal. No respiratory distress.     Breath sounds: Normal breath sounds. No stridor. No wheezing, rhonchi or rales.  Chest:     Chest wall: No tenderness.  Musculoskeletal:        General: Normal range of motion.     Cervical back: Normal range of motion and neck supple.  Skin:    General: Skin is warm and dry.     Capillary Refill: Capillary refill takes less than 2 seconds.     Coloration: Skin is not jaundiced or pale.     Findings: No bruising, erythema, lesion or rash.  Neurological:     General: No focal deficit present.     Mental Status: She is alert and oriented to person, place, and time. Mental status is at baseline.  Psychiatric:        Mood and Affect: Mood normal.        Behavior: Behavior normal.        Thought Content: Thought content normal.        Judgment: Judgment normal.     Results for orders placed or performed during the hospital encounter of 11/15/22  I-STAT creatinine  Result Value Ref Range   Creatinine, Ser 1.30 (H) 0.44 - 1.00 mg/dL      Assessment & Plan:   Problem List Items Addressed This Visit       Other   Generalized anxiety disorder - Primary    Anxiety still very high. She can take between 1-2 pills of her klonopin BID until after her surgery. Recheck about 4-6 weeks. Call with any concerns.        Other Visit Diagnoses     Acute cystitis with hematuria       Will treat with nitrofurantoin. Call with any concerns or if not improving.   Relevant Orders   Urine Culture   Abnormal urine odor       + UTI- will treat   Relevant Orders   WET PREP FOR TRICH, YEAST, CLUE  Urinalysis, Routine w reflex microscopic        Follow up plan: Return 4 weeks.

## 2022-12-29 ENCOUNTER — Telehealth: Payer: Self-pay | Admitting: Family Medicine

## 2022-12-29 NOTE — Telephone Encounter (Signed)
Pt is calling to report that the insurance will not pay for clonazePAM (KLONOPIN) 0.5 MG tablet [[id number]] wanting to know can she get a script for a lower dosage. And she wil cut in half? Please advise CB- [phone]

## 2022-12-29 NOTE — Telephone Encounter (Signed)
Routing to provider to advise.  

## 2022-12-30 LAB — URINE CULTURE

## 2022-12-31 NOTE — Telephone Encounter (Signed)
Can we please find out what's going on here?

## 2023-01-03 NOTE — Telephone Encounter (Signed)
Contacted [facility] in [place] regarding medication. Pharmacy states that the patient's insurance is not covering the prescription because it is written to possibly take 4 tablets total daily. Pharmacy states that insurance only covers a max of 3 tablets in a day.   Do you want to change the RX dose and directions for the medication? Or do the PA for the way it is written now?

## 2023-01-05 MED ORDER — CLONAZEPAM 1 MG PO TABS: 0.5000 mg | ORAL_TABLET | Freq: Two times a day (BID) | ORAL | 1 refills | Status: DC

## 2023-01-05 NOTE — Telephone Encounter (Signed)
Rx sent to her pharmacy 

## 2023-01-12 ENCOUNTER — Other Ambulatory Visit: Payer: Self-pay | Admitting: Family Medicine

## 2023-01-12 NOTE — Telephone Encounter (Signed)
Requested medication (s) are due for refill today - no  Requested medication (s) are on the active medication list -not at this dose  Future visit scheduled -yes  Last refill: na  Notes to clinic: non delegated rx, not current dose for this medication  Requested Prescriptions  Pending Prescriptions Disp Refills   clonazePAM (KLONOPIN) 0.5 MG tablet [Pharmacy Med Name: CLONAZEPAM 0.5 MG TABLET] 60 tablet 0    Sig: TAKE 1 TABLET BY MOUTH 2 TIMES DAILY.     Not Delegated - Psychiatry: Anxiolytics/Hypnotics 2 Failed - 01/12/2023  7:17 AM      Failed - This refill cannot be delegated      Failed - Urine Drug Screen completed in last 360 days      Passed - Patient is not pregnant      Passed - Valid encounter within last 6 months    Recent Outpatient Visits           2 weeks ago Generalized anxiety disorder   [facility] [facility] [place], Megan P, DO   1 month ago Generalized anxiety disorder   [place] [facility] [place], Megan P, DO   4 months ago Mixed hyperlipidemia   [facility] [facility] [place], Megan P, DO   7 months ago Encounter for annual wellness exam in Medicare patient   [place] [place], [place], DO   10 months ago Wound infection   [place] [facility] [place], Oralia Rud, DO       Future Appointments             In 3 weeks Laural Benes, Oralia Rud, DO [facility]   In 3 months Elie Goody, MD [facility]               Requested Prescriptions  Pending Prescriptions Disp Refills   clonazePAM (KLONOPIN) 0.5 MG tablet [Pharmacy Med Name: CLONAZEPAM 0.5 MG TABLET] 60 tablet 0    Sig: TAKE 1 TABLET BY MOUTH 2 TIMES DAILY.     Not Delegated - Psychiatry: Anxiolytics/Hypnotics 2 Failed - 01/12/2023  7:17 AM      Failed - This refill cannot be delegated      Failed - Urine Drug Screen completed in last 360 days       Passed - Patient is not pregnant      Passed - Valid encounter within last 6 months    Recent Outpatient Visits           2 weeks ago Generalized anxiety disorder   [place] [facility] [place], Megan P, DO   1 month ago Generalized anxiety disorder   [facility] [place], Megan P, DO   4 months ago Mixed hyperlipidemia   [place] [facility] [place], Megan P, DO   7 months ago Encounter for annual wellness exam in Medicare patient   [place] [facility] [place], [place], DO   10 months ago Wound infection   [place] [facility] [place], Oralia Rud, DO       Future Appointments             In 3 weeks Laural Benes, Oralia Rud, DO  [facility]   In 3 months Elie Goody, MD [facility]

## 2023-01-16 ENCOUNTER — Ambulatory Visit: Payer: Medicare HMO | Admitting: Dermatology

## 2023-01-18 ENCOUNTER — Telehealth: Payer: Self-pay

## 2023-01-18 NOTE — Transitions of Care (Post Inpatient/ED Visit) (Signed)
01/18/2023  Name: Maureen [name] MRN: [id number] DOB: 1946-01-03  Today's TOC FU Call Status: Today's TOC FU Call Status:: Successful TOC FU Call Completed TOC FU Call Complete Date: 01/18/23 Patient's Name and Date of Birth confirmed.  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 How have you been since you were released from the hospital?: Better Any questions or concerns?: No  Items Reviewed: Did you receive and understand the discharge instructions provided?: Yes Medications obtained,verified, and reconciled?: Yes (Medications Reviewed) Any new allergies since your discharge?: No Dietary orders reviewed?: Yes Type of Diet Ordered:: Reg Heart Healthy, NAS Do you have support at home?: Yes People in Home: child(ren), adult Name of Support/Comfort Primary Source: Daughter Silva Bandy  Medications Reviewed Today: Medications Reviewed Today     Reviewed by Johnnette Barrios, RN (Registered Nurse) on 01/18/23 at 1528  Med List Status: <None>   Medication Order Taking? Sig Documenting Provider Last Dose Status Informant  acetaminophen (TYLENOL) 325 MG tablet [id number] Yes Take 650 mg by mouth every 6 (six) hours as needed. [provider] Taking Active   albuterol (PROVENTIL) (2.5 MG/3ML) 0.083% nebulizer solution [id number] Yes Take 3 mLs (2.5 mg total) by nebulization every 6 (six) hours as needed for wheezing or shortness of breath. Johnson, Megan P, DO Taking Active   albuterol (VENTOLIN HFA) 108 (90 Base) MCG/ACT inhaler [id number] Yes Inhale 1-2 puffs into the lungs every 6 (six) hours as needed for wheezing or shortness of breath. Olevia Perches P, DO Taking Active   Ascorbic Acid (VITAMIN C) 100 MG tablet [id number] Yes Take 100 mg by  mouth daily. [provider] Taking Active   budesonide-formoterol (SYMBICORT) 160-4.5 MCG/ACT inhaler [id number] Yes Inhale 2 puffs into the lungs 2 (two) times daily. Olevia Perches P, DO Taking Active   Calcium-Vitamins C & D (CALCIUM/C/D PO) [id number] Yes Take 1 tablet by mouth daily. [provider] Taking Active   cetirizine (ZYRTEC ALLERGY) 10 MG tablet [id number] Yes Take 10 mg by mouth every morning. [provider] Taking Active   Cholecalciferol (VITAMIN D3) 50 MCG (2000 UT) TABS [id number] Yes Take 1 tablet by mouth daily at 6 (six) AM. [provider] Taking Active   clonazePAM (KLONOPIN) 1 MG tablet [id number] Yes Take 0.5-1 tablets (0.5-1 mg total) by mouth 2 (two) times daily. Olevia Perches P, DO Taking Active   clopidogrel (PLAVIX) 75 MG tablet [id number] Yes Take 75 mg by mouth daily. [provider] Taking Active   ezetimibe (ZETIA) 10 MG tablet [id number] Yes Take 1 tablet (10 mg total) by mouth daily. Lewayne Bunting, MD Taking Active   fluticasone [facility]) 50 MCG/ACT nasal spray [id number] Yes Place 2 sprays into the nose every morning. [provider] Taking Active   hydrocortisone (ANUSOL-HC) 25 MG suppository [id number] No Place 25 mg rectally 2 (two) times daily.  Patient not taking: Reported on 01/18/2023   [provider] Not Taking Active   ketoconazole (NIZORAL) 2 % cream [id number] Yes Apply 1 Application topically daily. [provider] Taking Active   ketoconazole (NIZORAL) 2 % cream [id number] Yes Apply 1 Application topically daily. Carmina Miller, MD Taking Active   KRILL OIL PO [id number] Yes Take by mouth daily. [provider] Taking Active Self  montelukast (SINGULAIR) 10 MG tablet [id number] Yes TAKE 1 TABLET BY  MOUTH EVERYDAY AT BEDTIME Laural Benes, Megan P, DO Taking Active   mupirocin ointment (BACTROBAN) 2 % [id number] Yes Apply 1 Application topically 2 (two) times daily. Johnson, Megan  P, DO Taking Active   nicotine (NICODERM CQ - DOSED IN MG/24 HOURS) 21 mg/24hr patch [id number] Yes Place 21 mg onto the skin daily. [provider] Taking Active   nicotine (NICODERM CQ - DOSED IN MG/24 HR) 7 mg/24hr patch [id number] Yes Place 7 mg onto the skin daily. [provider]  Consider Medication Status and Discontinue (Change in therapy)   nitrofurantoin, macrocrystal-monohydrate, (MACROBID) 100 MG capsule [id number] No Take 1 capsule (100 mg total) by mouth 2 (two) times daily.  Patient not taking: Reported on 01/18/2023   Dorcas Carrow, DO Not Taking Active   omeprazole (PRILOSEC) 20 MG capsule [id number] Yes Take 1 capsule (20 mg total) by mouth daily. Olevia Perches P, DO Taking Active   oxycodone (OXY-IR) 5 MG capsule [id number] Yes Take 5 mg by mouth every 4 (four) hours as needed for pain. [provider] Taking Active   Vitamin D, Ergocalciferol, (DRISDOL) 1.25 MG (50000 UNIT) CAPS capsule [id number] Yes Take 1 capsule (50,000 Units total) by mouth every 7 (seven) days. Olevia Perches P, DO Taking Active   vitamin E 100 UNIT capsule [id number] Yes Take 100 Units by mouth daily. [provider] Taking Active   zinc gluconate 50 MG tablet [id number] Yes Take 50 mg by mouth as needed. [provider] Taking Active Self           Med Note (HARRIS, ABBIE R   Tue May 23, 2016 10:07 AM)              Home Care and Equipment/Supplies: Were Home Health Services Ordered?: No Any new equipment or medical supplies ordered?: No  Functional Questionnaire: Do you need assistance with bathing/showering or dressing?: No Do you need assistance with meal preparation?: No Do you need assistance with eating?: No Do you have difficulty maintaining continence: No Do you need assistance with getting out of bed/getting out of a chair/moving?: No Do you have difficulty managing or taking your medications?: No  Follow up appointments reviewed: PCP  Follow-up appointment confirmed?: No (She is calling today to schedule) MD Provider Line Number:[phone] Given: No Specialist Hospital Follow-up appointment confirmed?: Yes Date of Specialist follow-up appointment?: 03/13/23 Follow-Up Specialty Provider:: Vasc F/U 5 weeks Stents placed scheduled for 12/24 Do you need transportation to your follow-up appointment?: No (Daughter will drive) Do you understand care options if your condition(s) worsen?: Yes-patient verbalized understanding  SDOH Interventions Today    Flowsheet Row Most Recent Value  SDOH Interventions   Food Insecurity Interventions Intervention Not Indicated  Housing Interventions Intervention Not Indicated  Utilities Interventions Intervention Not Indicated  Health Literacy Interventions AMB Referral        Patient is at high risk for readmission and/or has history of  high utilization  Discussed [facility]  TOC program and weekly calls to patient to assess condition/status, medication management  and provide support/education as indicated . Patient/ Caregiver voiced understanding and declined enrollment in the 30-day TOC Program.      The patient has been provided with contact information for the care management team and has been advised to call with any health related questions or concerns.    She is doing great, She had no complaints, She is getting back into usual routine She is voiding w/ out difficulty, Had BM today, No notable rectal bleeding.  Has not smoked since 01/14/23. She is planning in using Nicotine patches She is scheduling a f/u w/ PCP . Has planned surgery 5 weeks ( 12/24) for stent placement   She had no additional questions or concerns and did not feel she needed additional follow-up at this time  Susa Loffler , BSN, RN Care Management Coordinator [place]   [facility] [email or web address] Direct Dial: [phone]

## 2023-02-06 ENCOUNTER — Encounter: Payer: Self-pay | Admitting: Family Medicine

## 2023-02-06 ENCOUNTER — Ambulatory Visit (INDEPENDENT_AMBULATORY_CARE_PROVIDER_SITE_OTHER): Payer: Medicare HMO | Admitting: Family Medicine

## 2023-02-06 VITALS — BP 129/83 | HR 102 | Temp 98.6°F | Resp 13 | Wt 145.4 lb

## 2023-02-06 DIAGNOSIS — F411 Generalized anxiety disorder: Secondary | ICD-10-CM

## 2023-02-06 MED ORDER — CLONAZEPAM 1 MG PO TABS: 1.0000 mg | ORAL_TABLET | Freq: Two times a day (BID) | ORAL | 0 refills | Status: DC | PRN

## 2023-02-06 NOTE — Progress Notes (Signed)
BP 129/83 (BP Location: Left Arm, Patient Position: Sitting, Cuff Size: Normal)   Pulse (!) 102   Temp 98.6 F (37 C) (Oral)   Resp 13   Wt 145 lb 6.4 oz (66 kg)   SpO2 97%   BMI 25.76 kg/m    Subjective:    Patient ID: Maureen [name], female    DOB: September 05, 1945, 77 y.o.   MRN: [id number]  HPI: Maureen [name] is a 77 y.o. female  Chief Complaint  Patient presents with   Hospitalization Follow-up    Feels as she is doing well only in the hospital for 3 days. OR Procedures:  Right - BYPASS GFT W/OTHER THAN VEIN; ILIOFEMORAL Date 01/15/2023     Anxiety   ANXIETY/STRESS- doing better since she had her surgery. Feeling like her mood is doing well. No concerns.  Duration: chronic Status:better Anxious mood: yes  Excessive worrying: yes Irritability: no  Sweating: no Nausea: no Palpitations:no Hyperventilation: no Panic attacks: no Agoraphobia: no  Obscessions/compulsions: no Depressed mood: no    05/30/2022   12:10 PM 02/08/2022   10:25 AM 11/08/2021    2:53 PM 08/08/2021    2:01 PM 01/19/2021    9:21 AM  Depression screen PHQ 2/9  Decreased Interest 0 0 1 0 0  Down, Depressed, Hopeless 0 0 2 0 0  PHQ - 2 Score 0 0 3 0 0  Altered sleeping 0 0 1 1 0  Tired, decreased energy 0 0 2 1 0  Change in appetite 0 0 2 1 1   Feeling bad or failure about yourself  0 0 0 1 0  Trouble concentrating 0 0 1 1 0  Moving slowly or fidgety/restless 0 0 0 0 0  Suicidal thoughts 0 0 0 0 0  PHQ-9 Score 0 0 9 5 1   Difficult doing work/chores Not difficult at all    Not difficult at all   Anhedonia: no Weight changes: no Insomnia: no   Hypersomnia: no Fatigue/loss of energy: no Feelings of worthlessness: no Feelings of guilt: no Impaired concentration/indecisiveness: no Suicidal ideations: no  Crying spells: no Recent Stressors/Life Changes: no   Relationship problems: no   Family stress: no     Financial stress: no    Job stress: no    Recent death/loss:  no   Relevant past medical, surgical, family and social history reviewed and updated as indicated. Interim medical history since our last visit reviewed. Allergies and medications reviewed and updated.  Review of Systems  Constitutional: Negative.   Respiratory: Negative.    Cardiovascular: Negative.   Musculoskeletal: Negative.   Psychiatric/Behavioral:  Negative for agitation, behavioral problems, confusion, decreased concentration, dysphoric mood, hallucinations, self-injury, sleep disturbance and suicidal ideas. The patient is nervous/anxious. The patient is not hyperactive.     Per HPI unless specifically indicated above     Objective:    BP 129/83 (BP Location: Left Arm, Patient Position: Sitting, Cuff Size: Normal)   Pulse (!) 102   Temp 98.6 F (37 C) (Oral)   Resp 13   Wt 145 lb 6.4 oz (66 kg)   SpO2 97%   BMI 25.76 kg/m   Wt Readings from Last 3 Encounters:  02/06/23 145 lb 6.4 oz (66 kg)  12/27/22 149 lb (67.6 kg)  11/29/22 149 lb 12.8 oz (67.9 kg)    Physical Exam Vitals and nursing note reviewed.  Constitutional:      General: She is not in acute distress.    Appearance:  Normal appearance. She is not ill-appearing, toxic-appearing or diaphoretic.  HENT:     Head: Normocephalic and atraumatic.     Right Ear: External ear normal.     Left Ear: External ear normal.     Nose: Nose normal.     Mouth/Throat:     Mouth: Mucous membranes are moist.     Pharynx: Oropharynx is clear.  Eyes:     General: No scleral icterus.       Right eye: No discharge.        Left eye: No discharge.     Extraocular Movements: Extraocular movements intact.     Conjunctiva/sclera: Conjunctivae normal.     Pupils: Pupils are equal, round, and reactive to light.  Cardiovascular:     Rate and Rhythm: Normal rate and regular rhythm.     Pulses: Normal pulses.     Heart sounds: Normal heart sounds. No murmur heard.    No friction rub. No gallop.  Pulmonary:     Effort:  Pulmonary effort is normal. No respiratory distress.     Breath sounds: Normal breath sounds. No stridor. No wheezing, rhonchi or rales.  Chest:     Chest wall: No tenderness.  Musculoskeletal:        General: Normal range of motion.     Cervical back: Normal range of motion and neck supple.  Skin:    General: Skin is warm and dry.     Capillary Refill: Capillary refill takes less than 2 seconds.     Coloration: Skin is not jaundiced or pale.     Findings: No bruising, erythema, lesion or rash.  Neurological:     General: No focal deficit present.     Mental Status: She is alert and oriented to person, place, and time. Mental status is at baseline.  Psychiatric:        Mood and Affect: Mood normal.        Behavior: Behavior normal.        Thought Content: Thought content normal.        Judgment: Judgment normal.     Results for orders placed or performed in visit on 12/27/22  WET PREP FOR TRICH, YEAST, CLUE   Specimen: Urine   Urine  Result Value Ref Range   Trichomonas Exam Negative Negative   Yeast Exam Negative Negative   Clue Cell Exam Negative Negative  Urine Culture   Specimen: Urine   UR  Result Value Ref Range   Urine Culture, Routine Final report (A)    Organism ID, Bacteria Escherichia coli (A)    Antimicrobial Susceptibility Comment   Microscopic Examination   Urine  Result Value Ref Range   WBC, UA 6-10 (A) 0 - 5 /hpf   RBC, Urine 0-2 0 - 2 /hpf   Epithelial Cells (non renal) 0-10 0 - 10 /hpf   Mucus, UA Present (A) Not Estab.   Bacteria, UA Many (A) None seen/Few  Urinalysis, Routine w reflex microscopic  Result Value Ref Range   Specific Gravity, UA 1.025 1.005 - 1.030   pH, UA 6.0 5.0 - 7.5   Color, UA Yellow Yellow   Appearance Ur Cloudy (A) Clear   Leukocytes,UA 1+ (A) Negative   Protein,UA Negative Negative/Trace   Glucose, UA Negative Negative   Ketones, UA Negative Negative   RBC, UA Trace (A) Negative   Bilirubin, UA Negative Negative    Urobilinogen, Ur 0.2 0.2 - 1.0 mg/dL   Nitrite, UA Positive (A)  Negative   Microscopic Examination See below:       Assessment & Plan:   Problem List Items Addressed This Visit       Other   Generalized anxiety disorder - Primary    Under good control on current regimen. Continue current regimen. Continue to monitor. Call with any concerns. Refills up to date. Recheck in about 4-6 weeks.          Follow up plan: Return in about 6 weeks (around 03/20/2023).

## 2023-02-06 NOTE — Assessment & Plan Note (Signed)
Under good control on current regimen. Continue current regimen. Continue to monitor. Call with any concerns. Refills up to date. Recheck in about 4-6 weeks.

## 2023-02-23 ENCOUNTER — Encounter: Payer: Self-pay | Admitting: *Deleted

## 2023-02-26 ENCOUNTER — Encounter: Payer: Self-pay | Admitting: Family Medicine

## 2023-02-26 ENCOUNTER — Ambulatory Visit (INDEPENDENT_AMBULATORY_CARE_PROVIDER_SITE_OTHER): Payer: Medicare HMO | Admitting: Family Medicine

## 2023-02-26 VITALS — BP 135/82 | HR 89 | Ht 63.0 in | Wt 144.4 lb

## 2023-02-26 DIAGNOSIS — Z9889 Other specified postprocedural states: Secondary | ICD-10-CM | POA: Diagnosis not present

## 2023-02-26 DIAGNOSIS — Z8679 Personal history of other diseases of the circulatory system: Secondary | ICD-10-CM

## 2023-02-26 DIAGNOSIS — F411 Generalized anxiety disorder: Secondary | ICD-10-CM

## 2023-02-26 NOTE — Progress Notes (Signed)
BP 135/82   Pulse 89   Ht 5\' 3"  (1.6 m)   Wt 144 lb 6.4 oz (65.5 kg)   SpO2 98%   BMI 25.58 kg/m    Subjective:    Patient ID: Maureen [name], female    DOB: 26-Feb-1946, 77 y.o.   MRN: [id number]  HPI: Maureen [name] is a 77 y.o. 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 No belly pain. Lots of bruising in her arms. Sore throat from the ET tube. She notes that she continues to be anxious, but has been doing OK. No other concerns or complaints at this time.    Relevant past medical, surgical, family and social history reviewed and updated as indicated. Interim medical history since our last visit reviewed. Allergies and medications reviewed and updated.  Review of Systems  Constitutional: Negative.   HENT:  Positive for sore throat. Negative for congestion, dental problem, drooling, ear discharge, ear pain, facial swelling, hearing loss, mouth sores, nosebleeds, postnasal drip, rhinorrhea, sinus pressure, sinus pain, sneezing, tinnitus, trouble swallowing and voice change.   Respiratory: Negative.    Cardiovascular: Negative.   Gastrointestinal: Negative.   Musculoskeletal: Negative.   Psychiatric/Behavioral: Negative.      Per HPI unless specifically indicated above     Objective:    BP 135/82   Pulse 89   Ht 5\' 3"  (1.6 m)   Wt 144 lb 6.4 oz (65.5 kg)   SpO2 98%   BMI 25.58 kg/m   Wt Readings from Last 3 Encounters:  02/26/23 144 lb 6.4 oz (65.5 kg)  02/06/23 145 lb 6.4 oz (66  kg)  12/27/22 149 lb (67.6 kg)    Physical Exam Vitals and nursing note reviewed.  Constitutional:      General: She is not in acute distress.    Appearance: Normal appearance. She is not ill-appearing, toxic-appearing or diaphoretic.  HENT:     Head: Normocephalic and atraumatic.     Right Ear: External ear normal.     Left Ear: External ear normal.     Nose: Nose normal.     Mouth/Throat:     Mouth: Mucous membranes are moist.     Pharynx: Oropharynx is clear.  Eyes:     General: No scleral icterus.       Right eye: No discharge.        Left eye: No discharge.     Extraocular Movements: Extraocular movements intact.     Conjunctiva/sclera: Conjunctivae normal.     Pupils: Pupils are equal, round, and reactive to light.  Cardiovascular:     Rate and Rhythm: Normal rate and regular rhythm.     Pulses: Normal pulses.     Heart sounds: Normal heart sounds. No murmur heard.    No friction rub. No gallop.  Pulmonary:     Effort: Pulmonary effort is normal. No respiratory distress.     Breath sounds: Normal breath sounds. No stridor. No wheezing, rhonchi or rales.  Chest:     Chest wall: No tenderness.  Musculoskeletal:  General: Normal range of motion.     Cervical back: Normal range of motion and neck supple.  Skin:    General: Skin is warm and dry.     Capillary Refill: Capillary refill takes less than 2 seconds.     Coloration: Skin is not jaundiced or pale.     Findings: No bruising, erythema, lesion or rash.     Comments: Wounds on abdomen and leg well approximated, no redness, pain or swelling.   Neurological:     General: No focal deficit present.     Mental Status: She is alert and oriented to person, place, and time. Mental status is at baseline.  Psychiatric:        Mood and Affect: Mood normal.        Behavior: Behavior normal.        Thought Content: Thought content normal.        Judgment: Judgment normal.     Results for orders placed or performed  in visit on 12/27/22  WET PREP FOR TRICH, YEAST, CLUE   Specimen: Urine   Urine  Result Value Ref Range   Trichomonas Exam Negative Negative   Yeast Exam Negative Negative   Clue Cell Exam Negative Negative  Urine Culture   Specimen: Urine   UR  Result Value Ref Range   Urine Culture, Routine Final report (A)    Organism ID, Bacteria Escherichia coli (A)    Antimicrobial Susceptibility Comment   Microscopic Examination   Urine  Result Value Ref Range   WBC, UA 6-10 (A) 0 - 5 /hpf   RBC, Urine 0-2 0 - 2 /hpf   Epithelial Cells (non renal) 0-10 0 - 10 /hpf   Mucus, UA Present (A) Not Estab.   Bacteria, UA Many (A) None seen/Few  Urinalysis, Routine w reflex microscopic  Result Value Ref Range   Specific Gravity, UA 1.025 1.005 - 1.030   pH, UA 6.0 5.0 - 7.5   Color, UA Yellow Yellow   Appearance Ur Cloudy (A) Clear   Leukocytes,UA 1+ (A) Negative   Protein,UA Negative Negative/Trace   Glucose, UA Negative Negative   Ketones, UA Negative Negative   RBC, UA Trace (A) Negative   Bilirubin, UA Negative Negative   Urobilinogen, Ur 0.2 0.2 - 1.0 mg/dL   Nitrite, UA Positive (A) Negative   Microscopic Examination See below:       Assessment & Plan:   Problem List Items Addressed This Visit       Other   Generalized anxiety disorder    Stable. Has refills of her medicine. Recheck as scheduled.       Other Visit Diagnoses     S/P endovascular aneurysm repair    -  Primary   Surgical wounds healing well. No sign of infection. Tolerating her pain well. Continue to follow with vascular surgery. Call with any concerns.        Follow up plan: Return for As scheduled.  15 minutes spent with patient today.

## 2023-02-26 NOTE — Assessment & Plan Note (Signed)
Stable. Has refills of her medicine. Recheck as scheduled.

## 2023-03-30 ENCOUNTER — Telehealth: Payer: Self-pay | Admitting: Cardiology

## 2023-03-30 NOTE — Telephone Encounter (Signed)
 Patient identification verified by 2 forms. Bertina Cooks, RN    Called and spoke to patient  Patient states:   -has not completed labs for 12/11/22, the Lipid and LFT ordered by Dr. Pietro   -she will complete the labs on 04/05/23  -she was unable to complete due to recent surgeries   -since recent change to Zetia  she has been feeling much better  Patient has no further questions at this time

## 2023-03-30 NOTE — Telephone Encounter (Signed)
 New Message:    Patient wants Stanton Kidney to please call her. She have had 3 surgeries. She have not  been able to get out to get her lab work.

## 2023-04-03 ENCOUNTER — Ambulatory Visit (INDEPENDENT_AMBULATORY_CARE_PROVIDER_SITE_OTHER): Payer: Medicare HMO | Admitting: Family Medicine

## 2023-04-03 ENCOUNTER — Encounter: Payer: Self-pay | Admitting: Family Medicine

## 2023-04-03 VITALS — BP 130/67 | HR 77 | Wt 141.2 lb

## 2023-04-03 DIAGNOSIS — F411 Generalized anxiety disorder: Secondary | ICD-10-CM | POA: Diagnosis not present

## 2023-04-03 MED ORDER — CLONAZEPAM 1 MG PO TABS: 1.0000 mg | ORAL_TABLET | Freq: Two times a day (BID) | ORAL | 2 refills | Status: DC | PRN

## 2023-04-03 NOTE — Progress Notes (Signed)
 BP 130/67 (BP Location: Right Arm, Cuff Size: Normal)   Pulse 77   Wt 141 lb 3.2 oz (64 kg)   SpO2 96%   BMI 25.01 kg/m    Subjective:    Patient ID: Maureen [name], female    DOB: [birth year], 78 y.o.   MRN: [id number]  HPI: Maureen [name] is a 78 y.o. female  Chief Complaint  Patient presents with   Anxiety   ANXIETY/STRESS Duration: chronic Status:better Anxious mood: yes  Excessive worrying: yes Irritability: no  Sweating: no Nausea: no Palpitations:no Hyperventilation: no Panic attacks: no Agoraphobia: no  Obscessions/compulsions: no Depressed mood: yes    05/30/2022   12:10 PM 02/08/2022   10:25 AM 11/08/2021    2:53 PM 08/08/2021    2:01 PM 01/19/2021    9:21 AM  Depression screen PHQ 2/9  Decreased Interest 0 0 1 0 0  Down, Depressed, Hopeless 0 0 2 0 0  PHQ - 2 Score 0 0 3 0 0  Altered sleeping 0 0 1 1 0  Tired, decreased energy 0 0 2 1 0  Change in appetite 0 0 2 1 1   Feeling bad or failure about yourself  0 0 0 1 0  Trouble concentrating 0 0 1 1 0  Moving slowly or fidgety/restless 0 0 0 0 0  Suicidal thoughts 0 0 0 0 0  PHQ-9 Score 0 0 9 5 1   Difficult doing work/chores Not difficult at all    Not difficult at all   Anhedonia: no Weight changes: no Insomnia: yes hard to fall asleep  Hypersomnia: no Fatigue/loss of energy: yes Feelings of worthlessness: no Feelings of guilt: no Impaired concentration/indecisiveness: no Suicidal ideations: no  Crying spells: no Recent Stressors/Life Changes: no   Relationship problems: no   Family stress: no     Financial stress: no    Job stress: no    Recent death/loss: no   Relevant past medical, surgical, family and social history reviewed and updated as indicated. Interim medical history since our last visit reviewed. Allergies and medications reviewed and updated.  Review of Systems  Constitutional: Negative.   Respiratory: Negative.    Cardiovascular: Negative.   Gastrointestinal:  Negative.   Musculoskeletal: Negative.   Psychiatric/Behavioral:  Positive for dysphoric mood. Negative for agitation, behavioral problems, confusion, decreased concentration, hallucinations, self-injury, sleep disturbance and suicidal ideas. The patient is nervous/anxious. The patient is not hyperactive.     Per HPI unless specifically indicated above     Objective:    BP 130/67 (BP Location: Right Arm, Cuff Size: Normal)   Pulse 77   Wt 141 lb 3.2 oz (64 kg)   SpO2 96%   BMI 25.01 kg/m   Wt Readings from Last 3 Encounters:  04/03/23 141 lb 3.2 oz (64 kg)  02/26/23 144 lb 6.4 oz (65.5 kg)  02/06/23 145 lb 6.4 oz (66 kg)    Physical Exam Vitals and nursing note reviewed.  Constitutional:      General: She is not in acute distress.    Appearance: Normal appearance. She is not ill-appearing, toxic-appearing or diaphoretic.  HENT:     Head: Normocephalic and atraumatic.     Right Ear: External ear normal.     Left Ear: External ear normal.     Nose: Nose normal.     Mouth/Throat:     Mouth: Mucous membranes are moist.     Pharynx: Oropharynx is clear.  Eyes:     General:  No scleral icterus.       Right eye: No discharge.        Left eye: No discharge.     Extraocular Movements: Extraocular movements intact.     Conjunctiva/sclera: Conjunctivae normal.     Pupils: Pupils are equal, round, and reactive to light.  Cardiovascular:     Rate and Rhythm: Normal rate and regular rhythm.     Pulses: Normal pulses.     Heart sounds: Normal heart sounds. No murmur heard.    No friction rub. No gallop.  Pulmonary:     Effort: Pulmonary effort is normal. No respiratory distress.     Breath sounds: Normal breath sounds. No stridor. No wheezing, rhonchi or rales.  Chest:     Chest wall: No tenderness.  Musculoskeletal:        General: Normal range of motion.     Cervical back: Normal range of motion and neck supple.  Skin:    General: Skin is warm and dry.     Capillary Refill:  Capillary refill takes less than 2 seconds.     Coloration: Skin is not jaundiced or pale.     Findings: No bruising, erythema, lesion or rash.  Neurological:     General: No focal deficit present.     Mental Status: She is alert and oriented to person, place, and time. Mental status is at baseline.  Psychiatric:        Mood and Affect: Mood normal.        Behavior: Behavior normal.        Thought Content: Thought content normal.        Judgment: Judgment normal.     Results for orders placed or performed in visit on 12/27/22  WET PREP FOR TRICH, YEAST, CLUE   Collection Time: 12/27/22 11:13 AM   Specimen: Urine   Urine  Result Value Ref Range   Trichomonas Exam Negative Negative   Yeast Exam Negative Negative   Clue Cell Exam Negative Negative  Microscopic Examination   Collection Time: 12/27/22 11:13 AM   Urine  Result Value Ref Range   WBC, UA 6-10 (A) 0 - 5 /hpf   RBC, Urine 0-2 0 - 2 /hpf   Epithelial Cells (non renal) 0-10 0 - 10 /hpf   Mucus, UA Present (A) Not Estab.   Bacteria, UA Many (A) None seen/Few  Urinalysis, Routine w reflex microscopic   Collection Time: 12/27/22 11:13 AM  Result Value Ref Range   Specific Gravity, UA 1.025 1.005 - 1.030   pH, UA 6.0 5.0 - 7.5   Color, UA Yellow Yellow   Appearance Ur Cloudy (A) Clear   Leukocytes,UA 1+ (A) Negative   Protein,UA Negative Negative/Trace   Glucose, UA Negative Negative   Ketones, UA Negative Negative   RBC, UA Trace (A) Negative   Bilirubin, UA Negative Negative   Urobilinogen, Ur 0.2 0.2 - 1.0 mg/dL   Nitrite, UA Positive (A) Negative   Microscopic Examination See below:   Urine Culture   Collection Time: 12/27/22  3:32 PM   Specimen: Urine   UR  Result Value Ref Range   Urine Culture, Routine Final report (A)    Organism ID, Bacteria Escherichia coli (A)    Antimicrobial Susceptibility Comment       Assessment & Plan:   Problem List Items Addressed This Visit       Other   Generalized  anxiety disorder - Primary   Under good control on  current regimen. Continue current regimen. Continue to monitor. Call with any concerns. Refills given for 3 months. Encouraged her to try to cut back on her medicine and only take it 2x a day if she's very anxious. Consider cutting her down to 0.5mg  next visit.          Follow up plan: Return in about 3 months (around 07/02/2023) for physical.

## 2023-04-03 NOTE — Assessment & Plan Note (Signed)
 Under good control on current regimen. Continue current regimen. Continue to monitor. Call with any concerns. Refills given for 3 months. Encouraged her to try to cut back on her medicine and only take it 2x a day if she's very anxious. Consider cutting her down to 0.5mg  next visit.

## 2023-04-06 LAB — LIPID PANEL
Chol/HDL Ratio: 4.9 {ratio} — ABNORMAL HIGH (ref 0.0–4.4)
Cholesterol, Total: 206 mg/dL — ABNORMAL HIGH (ref 100–199)
HDL: 42 mg/dL (ref >39)
LDL Chol Calc (NIH): 135 mg/dL — ABNORMAL HIGH (ref 0–99)
Triglycerides: 161 mg/dL — ABNORMAL HIGH (ref 0–149)
VLDL Cholesterol Cal: 29 mg/dL (ref 5–40)

## 2023-04-06 LAB — HEPATIC FUNCTION PANEL
ALT: 11 [IU]/L (ref 0–32)
AST: 13 [IU]/L (ref 0–40)
Albumin: 4 g/dL (ref 3.8–4.8)
Alkaline Phosphatase: 128 [IU]/L — ABNORMAL HIGH (ref 44–121)
Bilirubin Total: 0.2 mg/dL (ref 0.0–1.2)
Bilirubin, Direct: 0.1 mg/dL (ref 0.00–0.40)
Total Protein: 6.1 g/dL (ref 6.0–8.5)

## 2023-04-24 ENCOUNTER — Encounter: Payer: Self-pay | Admitting: Dermatology

## 2023-04-24 ENCOUNTER — Ambulatory Visit: Payer: Medicare HMO | Admitting: Dermatology

## 2023-04-24 DIAGNOSIS — L720 Epidermal cyst: Secondary | ICD-10-CM

## 2023-04-24 DIAGNOSIS — W908XXA Exposure to other nonionizing radiation, initial encounter: Secondary | ICD-10-CM

## 2023-04-24 DIAGNOSIS — D1801 Hemangioma of skin and subcutaneous tissue: Secondary | ICD-10-CM

## 2023-04-24 DIAGNOSIS — L821 Other seborrheic keratosis: Secondary | ICD-10-CM

## 2023-04-24 DIAGNOSIS — Z1283 Encounter for screening for malignant neoplasm of skin: Secondary | ICD-10-CM | POA: Diagnosis not present

## 2023-04-24 DIAGNOSIS — L853 Xerosis cutis: Secondary | ICD-10-CM

## 2023-04-24 DIAGNOSIS — L814 Other melanin hyperpigmentation: Secondary | ICD-10-CM | POA: Diagnosis not present

## 2023-04-24 DIAGNOSIS — D239 Other benign neoplasm of skin, unspecified: Secondary | ICD-10-CM

## 2023-04-24 DIAGNOSIS — L578 Other skin changes due to chronic exposure to nonionizing radiation: Secondary | ICD-10-CM

## 2023-04-24 DIAGNOSIS — Z85828 Personal history of other malignant neoplasm of skin: Secondary | ICD-10-CM

## 2023-04-24 DIAGNOSIS — D229 Melanocytic nevi, unspecified: Secondary | ICD-10-CM

## 2023-04-24 DIAGNOSIS — D234 Other benign neoplasm of skin of scalp and neck: Secondary | ICD-10-CM

## 2023-04-24 NOTE — Patient Instructions (Addendum)
 Start Differin 0.1% gel nightly to white papule on nose, only need a very small amount nightly  Recommend Amlactin Cream for moisturizer daily  Start Opzelure cream once daily to chin  Due to recent changes in healthcare laws, you may see results of your pathology and/or laboratory studies on MyChart before the doctors have had a chance to review them. We understand that in some cases there may be results that are confusing or concerning to you. Please understand that not all results are received at the same time and often the doctors may need to interpret multiple results in order to provide you with the best plan of care or course of treatment. Therefore, we ask that you please give us  2 business days to thoroughly review all your results before contacting the office for clarification. Should we see a critical lab result, you will be contacted sooner.   If You Need Anything After Your Visit  If you have any questions or concerns for your doctor, please call our main line at [phone] and press option 4 to reach your doctor's medical assistant. If no one answers, please leave a voicemail as directed and we will return your call as soon as possible. Messages left after 4 pm will be answered the following business day.   You may also send us  a message via MyChart. We typically respond to MyChart messages within 1-2 business days.  For prescription refills, please ask your pharmacy to contact our office. Our fax number is [phone].  If you have an urgent issue when the clinic is closed that cannot wait until the next business day, you can page your doctor at the number below.    Please note that while we do our best to be available for urgent issues outside of office hours, we are not available 24/7.   If you have an urgent issue and are unable to reach us , you may choose to seek medical care at your doctor's office, retail clinic, urgent care center, or emergency room.  If you have a  medical emergency, please immediately call 911 or go to the emergency department.  Pager Numbers  - Dr. Hester: [phone]  - Dr. Jackquline: [phone]  - Dr. Claudene: [phone]   In the event of inclement weather, please call our main line at [phone] for an update on the status of any delays or closures.  Dermatology Medication Tips: Please keep the boxes that topical medications come in in order to help keep track of the instructions about where and how to use these. Pharmacies typically print the medication instructions only on the boxes and not directly on the medication tubes.   If your medication is too expensive, please contact our office at [phone] option 4 or send us  a message through MyChart.   We are unable to tell what your co-pay for medications will be in advance as this is different depending on your insurance coverage. However, we may be able to find a substitute medication at lower cost or fill out paperwork to get insurance to cover a needed medication.   If a prior authorization is required to get your medication covered by your insurance company, please allow us  1-2 business days to complete this process.  Drug prices often vary depending on where the prescription is filled and some pharmacies may offer cheaper prices.  The website www.goodrx.com contains coupons for medications through different pharmacies. The prices here do not account for what the cost may be with help from insurance (  it may be cheaper with your insurance), but the website can give you the price if you did not use any insurance.  - You can print the associated coupon and take it with your prescription to the pharmacy.  - You may also stop by our office during regular business hours and pick up a GoodRx coupon card.  - If you need your prescription sent electronically to a different pharmacy, notify our office through [facility] or by phone at [phone] option 4.     Si  Usted Necesita Algo Despus de Su Visita  Tambin puede enviarnos un mensaje a travs de Clinical Cytogeneticist. Por lo general respondemos a los mensajes de MyChart en el transcurso de 1 a 2 das hbiles.  Para renovar recetas, por favor pida a su farmacia que se ponga en contacto con nuestra oficina. Randi lakes de fax es [place] [phone].  Si tiene un asunto urgente cuando la clnica est cerrada y que no puede esperar hasta el siguiente da hbil, puede llamar/localizar a su doctor(a) al nmero que aparece a continuacin.   Por favor, tenga en cuenta que aunque hacemos todo lo posible para estar disponibles para asuntos urgentes fuera del horario de [place], no estamos disponibles las 24 horas del da, los 7 [address]  [phone] de la [place].   Si tiene un problema urgente y no puede comunicarse con nosotros, puede optar por buscar atencin mdica  en el consultorio de su doctor(a), en una clnica privada, en un centro de atencin urgente o en una sala de emergencias.  Si tiene engineer, drilling, por favor llame inmediatamente al 911 o vaya a la sala de emergencias.  Nmeros de bper  - Dr. Hester: [phone]  - Dra. Jackquline: [phone]  - Dr. Claudene: [phone]   En caso de inclemencias del tiempo, por favor llame a landry capes principal al [phone] para una actualizacin sobre el [place] de cualquier retraso o cierre.  Consejos para la medicacin en dermatologa: Por favor, guarde las cajas en las que vienen los medicamentos de uso tpico para ayudarle a seguir las instrucciones sobre dnde y cmo usarlos. Las farmacias generalmente imprimen las instrucciones del medicamento slo en las cajas y no directamente en los tubos del [place].   Si su medicamento es muy caro, por favor, pngase en contacto con landry rieger llamando al [phone] y presione la opcin 4 o envenos un mensaje a travs de Clinical Cytogeneticist.   No podemos decirle cul ser su copago por los medicamentos por adelantado ya que  esto es diferente dependiendo de la cobertura de su seguro. Sin embargo, es posible que podamos encontrar un medicamento sustituto a audiological scientist un formulario para que el seguro cubra el medicamento que se considera necesario.   Si se requiere una autorizacin previa para que su compaa de seguros cubra su medicamento, por favor permtanos de 1 a 2 das hbiles para completar este proceso.  Los precios de los medicamentos varan con frecuencia dependiendo del environmental consultant de dnde se surte la receta y alguna farmacias pueden ofrecer precios ms baratos.  El sitio web www.goodrx.com tiene cupones para medicamentos de health and safety inspector. Los precios aqu no tienen en cuenta lo que podra costar con la ayuda del seguro (puede ser ms barato con su seguro), pero el sitio web puede darle el precio si no utiliz tourist information centre manager.  - Puede imprimir el cupn correspondiente y llevarlo con su receta a la farmacia.  - Tambin puede pasar por nuestra oficina durante el horario de  atencin regular y education officer, museum una tarjeta de cupones de GoodRx.  - Si necesita que su receta se enve electrnicamente a una farmacia diferente, informe a nuestra oficina a travs de MyChart de [place] o por telfono llamando al [phone] y presione la opcin 4.

## 2023-04-24 NOTE — Progress Notes (Signed)
 New Patient Visit   Subjective  Maureen [name] is a 78 y.o. female who presents for the following: Skin Cancer Screening and Full Body Skin Exam, hx of BCC, SCC, hx of Lupus lower lip/chin, pt said saw Dr. Shlomo in [place] and he biopsied in past, check white bump on nose  New patient referral from [facility], DO.  Patient accompanied by daughter who contributes to history.  The patient presents for Total-Body Skin Exam (TBSE) for skin cancer screening and mole check. The patient has spots, moles and lesions to be evaluated, some may be new or changing and the patient may have concern these could be cancer.    The following portions of the chart were reviewed this encounter and updated as appropriate: medications, allergies, medical history  Review of Systems:  No other skin or systemic complaints except as noted in HPI or Assessment and Plan.  Objective  Well appearing patient in no apparent distress; mood and affect are within normal limits.  A full examination was performed including scalp, head, eyes, ears, nose, lips, neck, chest, axillae, abdomen, back, buttocks, bilateral upper extremities, bilateral lower extremities, hands, feet, fingers, toes, fingernails, and toenails. All findings within normal limits unless otherwise noted below.   Relevant physical exam findings are noted in the Assessment and Plan.  R medial lower leg     Chin    Assessment & Plan   SKIN CANCER SCREENING PERFORMED TODAY.  ACTINIC DAMAGE - Chronic condition, secondary to cumulative UV/sun exposure - diffuse scaly erythematous macules with underlying dyspigmentation - Recommend daily broad spectrum sunscreen SPF 30+ to sun-exposed areas, reapply every 2 hours as needed.  - Staying in the shade or wearing long sleeves, sun glasses (UVA+UVB protection) and wide brim hats (4-inch brim around the entire circumference of the hat) are also recommended for sun protection.  - Call for new  or changing lesions.  LENTIGINES, SEBORRHEIC KERATOSES, HEMANGIOMAS - Benign normal skin lesions - Benign-appearing - Call for any changes  MELANOCYTIC NEVI - Tan-brown and/or pink-flesh-colored symmetric macules and papules - Benign appearing on exam today - Observation - Call clinic for new or changing moles - Recommend daily use of broad spectrum spf 30+ sunscreen to sun-exposed areas.   HISTORY OF BASAL CELL CARCINOMA OF THE SKIN - No evidence of recurrence today - Recommend regular full body skin exams - Recommend daily broad spectrum sunscreen SPF 30+ to sun-exposed areas, reapply every 2 hours as needed.  - Call if any new or changing lesions are noted between office visits  - R cheek  HISTORY OF SQUAMOUS CELL CARCINOMA OF THE SKIN - No evidence of recurrence today - No lymphadenopathy - Recommend regular full body skin exams - Recommend daily broad spectrum sunscreen SPF 30+ to sun-exposed areas, reapply every 2 hours as needed.  - Call if any new or changing lesions are noted between office visits - Back   BLUE NEVUS L frontal scalp Exam: blue gray macule L frontal scalp  Treatment Plan: Benign-appearing.  Observation.  Call clinic for new or changing moles.  Recommend daily use of broad spectrum spf 30+ sunscreen to sun-exposed areas.   Milia face - tiny firm white papules - type of cyst - benign - sometimes these will clear with nightly OTC adapalene/Differin 0.1% gel or retinol. - may be extracted if symptomatic - observe  - Recommend Differin 0.1% gel qhs to aa face  Xerosis - diffuse xerotic patches - recommend gentle, hydrating skin care -  gentle skin care handout given - Recommend Amlactin cream or Cerave cream or Vanicream, samples of each given MULTIPLE BENIGN NEVI   LENTIGINES   SEBORRHEIC KERATOSES   ACTINIC ELASTOSIS   CHERRY ANGIOMA   MILIA   BLUE NEVUS   XEROSIS CUTIS     PURUPRIC SK R medial lower leg Exam: Red  vascular patch with overlying hyperkeratosis  Treatment Plan: monitor  LUPUS vs AK/SCC Lower lip/chin Exam: pink scaly crusted plaque  Treatment Plan: Clinically favour AK or SCC but patient reports past biopsy of similar lesion that revealed lupus Will request records from Dr. Dela Start Opzelura  cr qd to lesion; it may respond if lupus, sample x 1 Lot [id number] exp 05/17/2024   Return in about 6 months (around 10/22/2023) for TBSE.  I, Grayce Saunas, RMA, am acting as scribe for Boneta Sharps, MD .   Documentation: I have reviewed the above documentation for accuracy and completeness, and I agree with the above.  Boneta Sharps, MD

## 2023-04-26 ENCOUNTER — Telehealth: Payer: Self-pay

## 2023-04-26 NOTE — Telephone Encounter (Signed)
 Biopsy requested from Dr. Tresa Frohlich in media. aw

## 2023-05-22 ENCOUNTER — Ambulatory Visit
Admission: RE | Admit: 2023-05-22 | Discharge: 2023-05-22 | Disposition: A | Discharge status: Home / Self Care | Payer: Medicare HMO | Source: Ambulatory Visit | Attending: Radiation Oncology | Admitting: Radiation Oncology

## 2023-05-22 DIAGNOSIS — C3490 Malignant neoplasm of unspecified part of unspecified bronchus or lung: Secondary | ICD-10-CM | POA: Diagnosis present

## 2023-05-22 MED ORDER — IOHEXOL 300 MG/ML  SOLN: 75.0000 mL | Freq: Once | INTRAMUSCULAR | Status: DC | PRN

## 2023-05-22 MED ORDER — IOHEXOL 300 MG/ML  SOLN
60.0000 mL | Freq: Once | INTRAMUSCULAR | Status: AC | PRN
  Administered 2023-05-22: 60 mL via INTRAVENOUS

## 2023-05-23 ENCOUNTER — Ambulatory Visit: Payer: Medicare HMO | Admitting: Dermatology

## 2023-05-30 ENCOUNTER — Ambulatory Visit: Payer: Medicare HMO | Admitting: Dermatology

## 2023-05-30 ENCOUNTER — Encounter: Payer: Self-pay | Admitting: Dermatology

## 2023-05-30 DIAGNOSIS — Z79899 Other long term (current) drug therapy: Secondary | ICD-10-CM

## 2023-05-30 DIAGNOSIS — L930 Discoid lupus erythematosus: Secondary | ICD-10-CM

## 2023-05-30 DIAGNOSIS — D492 Neoplasm of unspecified behavior of bone, soft tissue, and skin: Secondary | ICD-10-CM | POA: Diagnosis not present

## 2023-05-30 DIAGNOSIS — L570 Actinic keratosis: Secondary | ICD-10-CM | POA: Diagnosis not present

## 2023-05-30 MED ORDER — OPZELURA 1.5 % EX CREA: TOPICAL_CREAM | CUTANEOUS | 2 refills | Status: AC

## 2023-05-30 NOTE — Patient Instructions (Addendum)
 [facility] - [place], [place] - [address] [place] [address] Phone: [phone]  Fax: [phone]    Cryotherapy Aftercare  Wash gently with soap and water everyday.   Apply Vaseline and Band-Aid daily until healed.    Wound Care Instructions  Cleanse wound gently with soap and water once a day then pat dry with clean gauze. Apply a thin coat of Petrolatum (petroleum jelly, "Vaseline") over the wound (unless you have an allergy to this). We recommend that you use a new, sterile tube of Vaseline. Do not pick or remove scabs. Do not remove the yellow or white "healing tissue" from the base of the wound.  Cover the wound with fresh, clean, nonstick gauze and secure with paper tape. You may use Band-Aids in place of gauze and tape if the wound is small enough, but would recommend trimming much of the tape off as there is often too much. Sometimes Band-Aids can irritate the skin.  You should call the office for your biopsy report after 1 week if you have not already been contacted.  If you experience any problems, such as abnormal amounts of bleeding, swelling, significant bruising, significant pain, or evidence of infection, please call the office immediately.  FOR ADULT SURGERY PATIENTS: If you need something for pain relief you may take 1 extra strength Tylenol (acetaminophen) AND 2 Ibuprofen (200mg  each) together every 4 hours as needed for pain. (do not take these if you are allergic to them or if you have a reason you should not take them.) Typically, you may only need pain medication for 1 to 3 days.         Due to recent changes in healthcare laws, you may see results of your pathology and/or laboratory studies on MyChart before the doctors have had a chance to review them. We understand that in some cases there may be results that are confusing or concerning to you. Please understand that not all results are received at the same time  and often the doctors may need to interpret multiple results in order to provide you with the best plan of care or course of treatment. Therefore, we ask that you please give [place] 2 business days to thoroughly review all your results before contacting the office for clarification. Should we see a critical lab result, you will be contacted sooner.   If You Need Anything After Your Visit  If you have any questions or concerns for your doctor, please call our main line at [phone] and press option 4 to reach your doctor's medical assistant. If no one answers, please leave a voicemail as directed and we will return your call as soon as possible. Messages left after 4 pm will be answered the following business day.   You may also send [place] a message via MyChart. We typically respond to MyChart messages within 1-2 business days.  For prescription refills, please ask your pharmacy to contact our office. Our fax number is [phone].  If you have an urgent issue when the clinic is closed that cannot wait until the next business day, you can page your doctor at the number below.    Please note that while we do our best to be available for urgent issues outside of office hours, we are not available 24/7.   If you have an urgent issue and are unable to reach [place], you may choose to seek medical care at your doctor's office, retail  clinic, urgent care center, or emergency room.  If you have a medical emergency, please immediately call 911 or go to the emergency department.  Pager Numbers  - Dr. Gwen Pounds: [phone]  - Dr. Roseanne Reno: [phone]  - Dr. Katrinka Blazing: [phone]   In the event of inclement weather, please call our main line at [phone] for an update on the status of any delays or closures.  Dermatology Medication Tips: Please keep the boxes that topical medications come in in order to help keep track of the instructions about where and how to use these. Pharmacies typically print the  medication instructions only on the boxes and not directly on the medication tubes.   If your medication is too expensive, please contact our office at [phone] option 4 or send [place] a message through MyChart.   We are unable to tell what your co-pay for medications will be in advance as this is different depending on your insurance coverage. However, we may be able to find a substitute medication at lower cost or fill out paperwork to get insurance to cover a needed medication.   If a prior authorization is required to get your medication covered by your insurance company, please allow [place] 1-2 business days to complete this process.  Drug prices often vary depending on where the prescription is filled and some pharmacies may offer cheaper prices.  The website www.goodrx.com contains coupons for medications through different pharmacies. The prices here do not account for what the cost may be with help from insurance (it may be cheaper with your insurance), but the website can give you the price if you did not use any insurance.  - You can print the associated coupon and take it with your prescription to the pharmacy.  - You may also stop by our office during regular business hours and pick up a GoodRx coupon card.  - If you need your prescription sent electronically to a different pharmacy, notify our office through [facility] or by phone at [phone] option 4.     Si Usted Necesita Algo Despus de Su Visita  Tambin puede enviarnos un mensaje a travs de Clinical cytogeneticist. Por lo general respondemos a los mensajes de MyChart en el transcurso de 1 a 2 das hbiles.  Para renovar recetas, por favor pida a su farmacia que se ponga en contacto con nuestra oficina. Annie Sable de fax es [place] [phone].  Si tiene un asunto urgente cuando la clnica est cerrada y que no puede esperar hasta el siguiente da hbil, puede llamar/localizar a su doctor(a) al nmero que aparece a continuacin.    Por favor, tenga en cuenta que aunque hacemos todo lo posible para estar disponibles para asuntos urgentes fuera del horario de [place], no estamos disponibles las 24 horas del da, los 7 [address]  [address] de la [place].   Si tiene un problema urgente y no puede comunicarse con nosotros, puede optar por buscar atencin mdica  en el consultorio de su doctor(a), en una clnica privada, en un centro de atencin urgente o en una sala de emergencias.  Si tiene Engineer, drilling, por favor llame inmediatamente al 911 o vaya a la sala de emergencias.  Nmeros de bper  - Dr. Gwen Pounds: [phone]  - Dra. Roseanne Reno: [phone]  - Dr. Katrinka Blazing: [phone]   En caso de inclemencias del tiempo, por favor llame a Lacy Duverney principal al [phone] para una actualizacin sobre el [place] de cualquier retraso o cierre.  Consejos para la medicacin en dermatologa: Por favor,  guarde las cajas en las que vienen los medicamentos de uso tpico para ayudarle a seguir las instrucciones sobre dnde y cmo usarlos. Las farmacias generalmente imprimen las instrucciones del medicamento slo en las cajas y no directamente en los tubos del [place].   Si su medicamento es muy caro, por favor, pngase en contacto con Rolm Gala llamando al [phone] y presione la opcin 4 o envenos un mensaje a travs de Clinical cytogeneticist.   No podemos decirle cul ser su copago por los medicamentos por adelantado ya que esto es diferente dependiendo de la cobertura de su seguro. Sin embargo, es posible que podamos encontrar un medicamento sustituto a Audiological scientist un formulario para que el seguro cubra el medicamento que se considera necesario.   Si se requiere una autorizacin previa para que su compaa de seguros [place] su medicamento, por favor permtanos de 1 a 2 das hbiles para completar [address]  Los precios de los medicamentos varan con frecuencia dependiendo del Environmental consultant de dnde se surte la receta y alguna  farmacias pueden ofrecer precios ms baratos.  El sitio web www.goodrx.com tiene cupones para medicamentos de Health and safety inspector. Los precios aqu no tienen en cuenta lo que podra costar con la ayuda del seguro (puede ser ms barato con su seguro), pero el sitio web puede darle el precio si no utiliz Tourist information centre manager.  - Puede imprimir el cupn correspondiente y llevarlo con su receta a la farmacia.  - Tambin puede pasar por nuestra oficina durante el horario de atencin regular y Education officer, museum una tarjeta de cupones de GoodRx.  - Si necesita que su receta se enve electrnicamente a una farmacia diferente, informe a nuestra oficina a travs de MyChart de [place] o por telfono llamando al [phone] y presione la opcin 4.

## 2023-05-30 NOTE — Progress Notes (Signed)
   Follow-Up Visit   Subjective  Maureen [name] is a 78 y.o. female who presents for the following: Recheck spot on her lower lip treating with Opzelura cream, helping area no longer burning, patient reports past biopsy of similar lesion that revealed lupus taken by Dr. Adolphus Birchwood  Daughter is with patient   The following portions of the chart were reviewed this encounter and updated as appropriate: medications, allergies, medical history  Review of Systems:  No other skin or systemic complaints except as noted in HPI or Assessment and Plan.  Objective  Well appearing patient in no apparent distress; mood and affect are within normal limits.  A focused examination was performed of the following areas:face   Relevant exam findings are noted in the Assessment and Plan.  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 (Dr Adolphus Birchwood) Improved on current treatment  Lower lip/chin Exam: improvement in lesion with mild residual pink scaly papules  Plan: Continue Opzelura cream apply to affected skin BID Sending prescription to [facility]  NEOPLASM OF SKIN Right Lower Leg - medial Skin / nail biopsy Type of biopsy: tangential   Informed consent: discussed and consent obtained   Timeout: patient name, date of birth, surgical site, and procedure verified   Procedure prep:  Patient was prepped and draped in usual sterile fashion Prep type:  Isopropyl alcohol Anesthesia: the lesion was anesthetized in a standard fashion   Anesthetic:  1% lidocaine w/ epinephrine 1-100,000 buffered w/ 8.4% NaHCO3 Instrument used: DermaBlade   Hemostasis achieved with: pressure and aluminum chloride   Outcome: patient tolerated procedure well   Post-procedure details: sterile dressing applied and wound care instructions given   Dressing type: bandage and petrolatum   Specimen 1 - Surgical pathology Differential  Diagnosis: ISK vs melanoma  Check Margins: No DISCOID LUPUS ERYTHEMATOSUS   Related Medications Ruxolitinib Phosphate (OPZELURA) 1.5 % CREA Apply to affected skin qd  Return for as scheduled in August .  I, Angelique Holm, CMA, am acting as scribe for Elie Goody, MD .   Documentation: I have reviewed the above documentation for accuracy and completeness, and I agree with the above.  Elie Goody, MD

## 2023-05-31 ENCOUNTER — Telehealth: Payer: Self-pay

## 2023-05-31 NOTE — Telephone Encounter (Signed)
 [facility] called and stated that Opzelura is not covered by insurance and she has a VERY high copay. Please advise.

## 2023-06-01 LAB — SURGICAL PATHOLOGY

## 2023-06-03 ENCOUNTER — Encounter: Payer: Self-pay | Admitting: Dermatology

## 2023-06-04 NOTE — Telephone Encounter (Signed)
 We can attempt an appeal but her copay is going to be high regardless, she has [employer].

## 2023-06-06 ENCOUNTER — Ambulatory Visit
Admission: RE | Admit: 2023-06-06 | Discharge: 2023-06-06 | Disposition: A | Discharge status: Home / Self Care | Payer: Medicare HMO | Source: Ambulatory Visit | Attending: Radiation Oncology | Admitting: Radiation Oncology

## 2023-06-06 ENCOUNTER — Encounter: Payer: Self-pay | Admitting: Radiation Oncology

## 2023-06-06 ENCOUNTER — Other Ambulatory Visit: Payer: Self-pay | Admitting: *Deleted

## 2023-06-06 VITALS — BP 142/74 | HR 90 | Temp 97.4°F | Resp 16 | Wt 143.0 lb

## 2023-06-06 DIAGNOSIS — Z923 Personal history of irradiation: Secondary | ICD-10-CM | POA: Diagnosis not present

## 2023-06-06 DIAGNOSIS — C3490 Malignant neoplasm of unspecified part of unspecified bronchus or lung: Secondary | ICD-10-CM

## 2023-06-06 DIAGNOSIS — R918 Other nonspecific abnormal finding of lung field: Secondary | ICD-10-CM | POA: Insufficient documentation

## 2023-06-06 NOTE — Progress Notes (Signed)
 Radiation Oncology Follow up Note  Name: Maureen [name]   Date:   06/06/2023 MRN:  [id number] DOB: 08/21/1945    This 78 y.o. female presents to the clinic today for 56-month follow-up status post SBRT to her left upper lobe for presumed stage I non-small cell lung cancer.  REFERRING PROVIDER: Dorcas Carrow, DO  HPI: Patient is a 78 year old female now out 10 months having completed SBRT to her left upper lobe for presumed stage I non-small cell lung cancer.  Seen today in routine follow-up she is doing well.  She specifically denies cough hemoptysis or chest tightness..  She had a recent CT scan showing stable tiny apical nodularity similar in size.  No other new developing mass or nodularity or chest is noted.  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  She is doing well although according to patient had a difficult time postoperatively.  COMPLICATIONS OF TREATMENT: none  FOLLOW UP COMPLIANCE: keeps appointments   PHYSICAL EXAM:  BP (!) 142/74   Pulse 90   Temp (!) 97.4 F (36.3 C) (Oral)   Resp 16   Wt 143 lb (64.9 kg)   BMI 25.33 kg/m  Well-developed well-nourished patient in NAD. HEENT reveals PERLA, EOMI, discs not visualized.  Oral cavity is clear. No oral mucosal lesions are identified. Neck is clear without evidence of cervical or supraclavicular adenopathy. Lungs are clear to A&P. Cardiac examination is essentially unremarkable with regular rate and rhythm without murmur rub or thrill. Abdomen is benign with no organomegaly or masses noted. Motor sensory and DTR levels are equal and symmetric in the upper and lower extremities. Cranial nerves II through XII are grossly intact. Proprioception is intact. No peripheral adenopathy or edema is identified. No motor or sensory levels are noted. Crude visual fields are within normal range.  RADIOLOGY RESULTS: CT scans reviewed compatible with above-stated findings  PLAN:  Present time patient is doing well have asked to see her back in 6 months for follow-up with repeat CT scan of her chest.  Patient is to call with any concerns at any time.  She continues follow-up with vascular surgery for her recent grafts.  I would like to take this opportunity to thank you for allowing me to participate in the care of your patient.Carmina Miller, MD

## 2023-07-09 ENCOUNTER — Encounter: Payer: Self-pay | Admitting: Family Medicine

## 2023-07-09 ENCOUNTER — Ambulatory Visit: Payer: Self-pay | Admitting: Family Medicine

## 2023-07-09 VITALS — BP 130/70 | HR 80 | Ht 63.0 in | Wt 142.2 lb

## 2023-07-09 DIAGNOSIS — E559 Vitamin D deficiency, unspecified: Secondary | ICD-10-CM | POA: Diagnosis not present

## 2023-07-09 DIAGNOSIS — J4489 Other specified chronic obstructive pulmonary disease: Secondary | ICD-10-CM | POA: Diagnosis not present

## 2023-07-09 DIAGNOSIS — Z0000 Encounter for general adult medical examination without abnormal findings: Secondary | ICD-10-CM

## 2023-07-09 DIAGNOSIS — F411 Generalized anxiety disorder: Secondary | ICD-10-CM | POA: Diagnosis not present

## 2023-07-09 DIAGNOSIS — E782 Mixed hyperlipidemia: Secondary | ICD-10-CM | POA: Diagnosis not present

## 2023-07-09 DIAGNOSIS — Z780 Asymptomatic menopausal state: Secondary | ICD-10-CM

## 2023-07-09 DIAGNOSIS — D692 Other nonthrombocytopenic purpura: Secondary | ICD-10-CM

## 2023-07-09 MED ORDER — CLONAZEPAM 1 MG PO TABS: 1.0000 mg | ORAL_TABLET | Freq: Two times a day (BID) | ORAL | 2 refills | Status: DC | PRN

## 2023-07-09 MED ORDER — ALBUTEROL SULFATE HFA 108 (90 BASE) MCG/ACT IN AERS
1.0000 | INHALATION_SPRAY | Freq: Four times a day (QID) | RESPIRATORY_TRACT | 6 refills | Status: AC | PRN
Start: 2023-07-09 — End: ?

## 2023-07-09 MED ORDER — MONTELUKAST SODIUM 10 MG PO TABS: ORAL_TABLET | ORAL | 1 refills | Status: DC

## 2023-07-09 NOTE — Assessment & Plan Note (Signed)
 Acting up with concerns about her daughter's house. Will continue current regimen. Continue to monitor. Refills for 3 months given- follow up 3 months.

## 2023-07-09 NOTE — Assessment & Plan Note (Signed)
 Under good control on current regimen. Continue current regimen. Continue to monitor. Call with any concerns. Refills through cardiology. Labs drawn today.

## 2023-07-09 NOTE — Assessment & Plan Note (Signed)
 Stable. Continue to monitor. Call with any concerns.  ?

## 2023-07-09 NOTE — Progress Notes (Signed)
BP 130/70 (BP Location: Left Arm, Patient Position: Sitting, Cuff Size: Normal)   Pulse 80   Ht 5\' 3"  (1.6 m)   Wt 142 lb 3.2 oz (64.5 kg)   SpO2 97%   BMI 25.19 kg/m    Subjective:    Patient ID: Maureen [name], female    DOB: 10/19/1945, 78 y.o.   MRN: [id number]  HPI: Maureen [name] is a 78 y.o. female presenting on 07/09/2023 for comprehensive medical examination. Current medical complaints include:  ANXIETY/DEPRESSION Duration: chronic Status:uncontrolled Anxious mood: yes  Excessive worrying: yes Irritability: no  Sweating: no Nausea: no Palpitations:no Hyperventilation: no Panic attacks: no Agoraphobia: no  Obscessions/compulsions: no Depressed mood: no    07/09/2023   10:23 AM 07/09/2023    9:55 AM 05/30/2022   12:10 PM 02/08/2022   10:25 AM 11/08/2021    2:53 PM  Depression screen PHQ 2/9  Decreased Interest 0 0 0 0 1  Down, Depressed, Hopeless 0 0 0 0 2  PHQ - 2 Score 0 0 0 0 3  Altered sleeping 0 0 0 0 1  Tired, decreased energy 0 0 0 0 2  Change in appetite 0 0 0 0 2  Feeling bad or failure about yourself  0 0 0 0 0  Trouble concentrating 0 0 0 0 1  Moving slowly or fidgety/restless 0 0 0 0 0  Suicidal thoughts 0 0 0 0 0  PHQ-9 Score 0 0 0 0 9  Difficult doing work/chores   Not difficult at all        07/09/2023    9:55 AM 02/08/2022   10:25 AM 11/08/2021    2:53 PM 08/08/2021    2:01 PM  GAD 7 : Generalized Anxiety Score  Nervous, Anxious, on Edge 0 0 2 1  Control/stop worrying 0 0 2 1  Worry too much - different things 0 0 2 1  Trouble relaxing 0 0 2 0  Restless 0 0 2 1  Easily annoyed or irritable 0 0 1 1  Afraid - awful might happen 0 0 0 1  Total GAD 7 Score 0 0 11 6  Anxiety Difficulty  Not difficult at all  Not difficult at all   Anhedonia: no Weight changes: no Insomnia: no   Hypersomnia: no Fatigue/loss of energy: no Feelings of worthlessness: no Feelings of guilt: no Impaired concentration/indecisiveness: no Suicidal  ideations: no  Crying spells: no Recent Stressors/Life Changes: no   Relationship problems: no   Family stress: yes     Financial stress: no    Job stress: no    Recent death/loss: no  HYPERLIPIDEMIA Hyperlipidemia status: excellent compliance Satisfied with current treatment?  yes Side effects:  yes Medication compliance: excellent compliance Past cholesterol meds: zetia  Supplements: fish oil Aspirin :  no The 10-year ASCVD risk score (Arnett DK, et al., 2019) is: 26.7%   Values used to calculate the score:     Age: 8 years     Sex: Female     Is Non-Hispanic African American: No     Diabetic: No     Tobacco smoker: Yes     Systolic Blood Pressure: 130 mmHg     Is BP treated: No     HDL Cholesterol: 42 mg/dL     Total Cholesterol: 206 mg/dL Chest pain:  no Coronary artery disease:  yes  Menopausal Symptoms: no  Depression Screen done today and results listed below:     07/09/2023   10:23  AM 07/09/2023    9:55 AM 05/30/2022   12:10 PM 02/08/2022   10:25 AM 11/08/2021    2:53 PM  Depression screen PHQ 2/9  Decreased Interest 0 0 0 0 1  Down, Depressed, Hopeless 0 0 0 0 2  PHQ - 2 Score 0 0 0 0 3  Altered sleeping 0 0 0 0 1  Tired, decreased energy 0 0 0 0 2  Change in appetite 0 0 0 0 2  Feeling bad or failure about yourself  0 0 0 0 0  Trouble concentrating 0 0 0 0 1  Moving slowly or fidgety/restless 0 0 0 0 0  Suicidal thoughts 0 0 0 0 0  PHQ-9 Score 0 0 0 0 9  Difficult doing work/chores   Not difficult at all      Past Medical History:  Past Medical History:  Diagnosis Date   Abdominal aortic aneurysm (AAA) without rupture (HCC)    followed by Dr Prescilla Brod   Abnormal EKG    pt states ekg always shows she is having a heart attack   Arthritis 2005   Breast cancer, left breast (HCC) 2011   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 This was a histologic grade 2 tumor measuring just under 1 cm in diameter. No invasive cancer was  identified. ER 90%, PR 40%.   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   Bronchitis    COPD (chronic obstructive pulmonary disease) (HCC)    Discoid lupus 2005   Emphysematous bleb (HCC)    GERD (gastroesophageal reflux disease)    Heart murmur 2005   Hypercholesterolemia 2012   Lung cancer [facility])    Mammographic microcalcification 2011   Panic attack    Personal history of chemotherapy 1994   Personal history of colonic polyps    Colon polyps   Personal history of other malignant neoplasm of skin 2015   basal cell   Personal history of tobacco use, presenting hazards to health    PUD (peptic ulcer disease)    Last EGD 1/09   Pulmonary nodule    Rectal polyp 2008   villous adenoma; last colonoscopy 1/09 WNL   Skin cancer, basal cell 2015   right cheek Mohs surgery at [facility]   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   COLONOSCOPY W/ BIOPSIES  2005,2009   Dr. Marquita Situ. Normal exam 2009. Villous adenoma of the rectum in 2005 adenomatous polyp from the ascending colon and rectum in 2002.   ESOPHAGOGASTRODUODENOSCOPY N/A 08/05/2014   Procedure: ESOPHAGOGASTRODUODENOSCOPY (EGD);  Surgeon: Marshall Skeeter, MD;  Location: [facility] ENDOSCOPY;  Service: Endoscopy;  Laterality: N/A;   ESOPHAGOGASTRODUODENOSCOPY (EGD) WITH PROPOFOL  N/A 07/25/2017   Procedure: ESOPHAGOGASTRODUODENOSCOPY (EGD) WITH PROPOFOL ;  Surgeon: Marshall Skeeter, MD;  Location: [facility] ENDOSCOPY;  Service: Endoscopy;  Laterality: N/A;    ESOPHAGOGASTRODUODENOSCOPY (EGD) WITH PROPOFOL  N/A 08/03/2021   Procedure: ESOPHAGOGASTRODUODENOSCOPY (EGD) WITH PROPOFOL ;  Surgeon: Marshall Skeeter, MD;  Location: [facility] ENDOSCOPY;  Service: Endoscopy;  Laterality: N/A;   LAPAROSCOPIC BILATERAL SALPINGO OOPHERECTOMY Bilateral 10/05/2021   Procedure: LAPAROSCOPIC BILATERAL SALPINGO OOPHORECTOMY, REMOVAL OF PELVIC MASSES, MINI LAPAROTOMY;  Surgeon: Hermine Loots, MD;  Location: [facility] ORS;  Service: Gynecology;  Laterality: Bilateral;   MASTECTOMY     bilateral   SIMPLE MASTECTOMY  1993   right   SIMPLE MASTECTOMY  2011   left   TONSILLECTOMY  1997   UPPER GI ENDOSCOPY  2003, 2005, 2009, 2012, 2014   2012 showed mild reflux changes without evidence of Barrett's epithelial changes 2014 biopsies at 37 cm showed changes suggestive of reflux esophagitis. No metaplasia.   VAGINAL HYSTERECTOMY  1977    Medications:  Current Outpatient Medications on File Prior to Visit  Medication Sig   acetaminophen  (TYLENOL ) 325 MG tablet Take 650 mg by mouth every 6 (six) hours as needed.   albuterol  (PROVENTIL ) (2.5 MG/3ML) 0.083% nebulizer solution Take 3 mLs (2.5 mg total) by nebulization every 6 (six) hours as needed for wheezing or shortness of breath.   Ascorbic Acid (VITAMIN C) 100 MG tablet Take 100 mg by mouth daily.   Calcium -Vitamins C & D (CALCIUM /C/D PO) Take 1 tablet by mouth daily.   cetirizine (ZYRTEC ALLERGY) 10 MG tablet Take 10 mg by mouth every morning.   Cholecalciferol (VITAMIN D3) 50 MCG (2000 UT) TABS Take 1 tablet by mouth daily at 6 (six) AM.   ezetimibe  (ZETIA ) 10 MG tablet Take 1 tablet (10 mg total) by mouth daily.   fluticasone (FLONASE) 50 MCG/ACT nasal spray Place 2 sprays into the nose every morning.   KRILL OIL PO Take by mouth daily.   lidocaine  4 % Place onto the skin.   omeprazole  (PRILOSEC) 20 MG capsule Take 1 capsule (20 mg total) by mouth daily.   Ruxolitinib Phosphate  (OPZELURA ) 1.5 % CREA Apply to affected skin qd    Vitamin D , Ergocalciferol , (DRISDOL ) 1.25 MG (50000 UNIT) CAPS capsule Take 1 capsule (50,000 Units total) by mouth every 7 (seven) days.   vitamin E 100 UNIT capsule Take 100 Units by mouth daily.   zinc gluconate 50 MG tablet Take 50 mg by mouth as needed.   No current facility-administered medications on file prior to visit.    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 [employer]    Comment: [place] [employer]  Tobacco Use   Smoking status: Every Day    Current packs/day: 0.50    Average packs/day: 0.5 packs/day for 40.0 years (20.0 ttl pk-yrs)    Types: Cigarettes   Smokeless tobacco: Never   Tobacco comments:    Pt wants to do hypnotizing  Vaping Use   Vaping status: Never Used  Substance and Sexual Activity   Alcohol use: Yes    Alcohol/week: 1.0 standard drink of alcohol  Types: 1 Cans of beer per week    Comment: 1 beer weekly   Drug use: No   Sexual activity: Not Currently  Other Topics Concern   Not on file  Social History Narrative   Tenley grew up in [place]. She has two adult daughters. She lives in her home. Her youngest daughter and grandchildren live with her. She has two dogs. 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 She loves watching her grandson play basketball. She also loves gardening and being outdoors and working in the yard.    Social Drivers of Manufacturing engineer Strain: Low Risk  (01/16/2023)   [facility]   Overall Financial Resource Strain (CARDIA)    Difficulty of Paying Living Expenses: Not very hard  Food Insecurity: No Food Insecurity (07/09/2023)   Hunger Vital Sign    Worried About Running Out of Food in the Last Year: Never true    Ran Out of Food in the Last Year: Never true  Transportation Needs: No Transportation Needs (01/16/2023)   Received from [facility] - Transportation    Lack of Transportation (Medical): No    Lack of Transportation (Non-Medical): No  Physical Activity: Inactive (07/09/2023)   Exercise Vital Sign    Days of Exercise per Week: 0 days    Minutes of Exercise per Session: 0 min  Stress: Stress Concern Present (07/09/2023)   [employer] of Occupational Health - Occupational Stress Questionnaire    Feeling of Stress : To some extent  Social Connections: Moderately Integrated (07/09/2023)   Social Connection and Isolation Panel [NHANES]    Frequency of Communication with Friends and Family: More than three times a week    Frequency of Social Gatherings with Friends and Family: More than three times a week    Attends Religious Services: More than 4 times per year    Active Member of [employer] or Organizations: Yes    Attends Banker Meetings: More than 4 times per year    Marital Status: Widowed  Intimate Partner Violence: Unknown (01/18/2023)   Humiliation, Afraid, Rape, and Kick questionnaire    Fear of Current or Ex-Partner: No    Emotionally Abused: No    Physically Abused: Not on file    Sexually Abused: No   Social History   Tobacco Use  Smoking Status Every Day   Current packs/day: 0.50   Average packs/day: 0.5 packs/day for 40.0 years (20.0 ttl pk-yrs)   Types: Cigarettes  Smokeless Tobacco Never  Tobacco Comments   Pt wants to do hypnotizing   Social History   Substance and Sexual Activity  Alcohol Use Yes   Alcohol/week:  1.0 standard drink of alcohol   Types: 1 Cans of beer per week   Comment: 1 beer weekly    Family History:  Family History  Problem Relation Age of Onset   Heart attack Father    Cancer Father        esophagus   Cancer Maternal Grandmother        breast cancer   Cancer Maternal Aunt        breast cancer    Past medical history, surgical history, medications, allergies, family history and social history reviewed with patient today and changes made to appropriate areas of the chart.   Review of Systems  Constitutional:  Positive for chills. Negative for diaphoresis, fever, malaise/fatigue and weight loss.  HENT:  Positive for ear pain (R ear stopped up). Negative for congestion, ear discharge, hearing loss, nosebleeds, sinus pain, sore throat and tinnitus.   Eyes: Negative.   Respiratory:  Positive for shortness of breath. Negative for cough, hemoptysis, sputum production, wheezing and stridor.   Cardiovascular: Negative.   Gastrointestinal:  Positive for abdominal pain. Negative for blood in stool, constipation, diarrhea, heartburn, melena, nausea and vomiting.  Genitourinary: Negative.   Musculoskeletal:  Positive for back pain. Negative for falls, joint pain, myalgias and neck pain.  Skin: Negative.   Neurological: Negative.   Endo/Heme/Allergies:  Positive for environmental allergies. Negative for polydipsia. Bruises/bleeds easily.  Psychiatric/Behavioral: Negative.     All other ROS negative except what is listed above and in the HPI.      Objective:    BP 130/70 (BP Location: Left Arm, Patient Position: Sitting, Cuff Size: Normal)   Pulse 80   Ht 5\' 3"  (1.6 m)   Wt 142 lb 3.2 oz (64.5 kg)   SpO2 97%   BMI 25.19 kg/m   Wt Readings from Last 3 Encounters:  07/09/23 142 lb 3.2 oz (64.5 kg)  06/06/23 143 lb (64.9 kg)  04/03/23 141 lb 3.2 oz (64 kg)    Physical Exam Vitals and nursing note reviewed.  Constitutional:      General: She is not in acute distress.     Appearance: Normal appearance. She is not ill-appearing, toxic-appearing or diaphoretic.  HENT:     Head: Normocephalic and atraumatic.     Right Ear: Tympanic membrane, ear canal and external ear normal. There is no impacted cerumen.     Left Ear: Tympanic membrane, ear canal and external ear normal. There is no impacted cerumen.     Nose: Nose normal. No congestion or rhinorrhea.     Mouth/Throat:     Mouth: Mucous membranes are moist.     Pharynx: Oropharynx is clear. No oropharyngeal exudate or posterior oropharyngeal erythema.  Eyes:     General: No scleral icterus.       Right eye: No discharge.        Left eye: No discharge.     Extraocular Movements: Extraocular movements intact.     Conjunctiva/sclera: Conjunctivae normal.     Pupils: Pupils are equal, round, and reactive to light.  Neck:     Vascular: No carotid bruit.  Cardiovascular:     Rate and Rhythm: Normal rate and regular rhythm.     Pulses: Normal pulses.     Heart sounds: No murmur heard.    No friction rub. No gallop.  Pulmonary:     Effort: Pulmonary effort is normal. No respiratory distress.     Breath sounds: Normal breath sounds. No stridor. No wheezing, rhonchi or rales.  Chest:     Chest wall: No tenderness.  Abdominal:     General: Abdomen is flat. Bowel sounds are normal. There is no distension.     Palpations: Abdomen is soft. There is no mass.     Tenderness: There is no abdominal tenderness. There is no right CVA tenderness, left CVA tenderness, guarding or rebound.     Hernia: No hernia is present.  Genitourinary:    Comments: Breast and pelvic exams deferred with shared decision making Musculoskeletal:        General: No swelling, tenderness, deformity or signs of injury.     Cervical back: Normal range of motion and neck supple. No rigidity. No muscular tenderness.     Right lower leg:  No edema.     Left lower leg: No edema.  Lymphadenopathy:     Cervical: No cervical adenopathy.  Skin:     General: Skin is warm and dry.     Capillary Refill: Capillary refill takes less than 2 seconds.     Coloration: Skin is not jaundiced or pale.     Findings: No bruising, erythema, lesion or rash.  Neurological:     General: No focal deficit present.     Mental Status: She is alert and oriented to person, place, and time. Mental status is at baseline.     Cranial Nerves: No cranial nerve deficit.     Sensory: No sensory deficit.     Motor: No weakness.     Coordination: Coordination normal.     Gait: Gait normal.     Deep Tendon Reflexes: Reflexes normal.  Psychiatric:        Mood and Affect: Mood normal.        Behavior: Behavior normal.        Thought Content: Thought content normal.        Judgment: Judgment normal.     Results for orders placed or performed in visit on 05/30/23  Surgical pathology   Collection Time: 05/30/23 12:00 AM  Result Value Ref Range   SURGICAL PATHOLOGY      SURGICAL PATHOLOGY [facility] [address] [place], [place] [address] Telephone [phone] or [phone] Fax [phone]  REPORT OF DERMATOPATHOLOGY   Accession #: [id number] Patient Name: VERBENA, BOEDING Visit # : [id number]  MRN: [id number] Cytotechnologist: Munoz-Bishop Md, Lovett Ruck, Dermatopathologist, Electronic Signature DOB/Age [birth year] (Age: 27) Gender: F Collected Date: 05/30/2023 Received Date: 05/30/2023  FINAL DIAGNOSIS       1. Skin, right lower leg - medial :       LICHENOID ACTINIC KERATOSIS       DATE SIGNED OUT: 06/01/2023 ELECTRONIC SIGNATURE : Munoz-Bishop Md, Melissa, Dermatopathologist, Electronic Signature  MICROSCOPIC DESCRIPTION 1. There is an abnormal pattern of maturation with keratinocyte atypia of the epidermis.  Focal interface degeneration is present with a band-like infiltrate of mononuclear cells, predominantly lymphocytes.  No infiltrating tumor is seen. This combination of change s is classified as a  lichenoid actinic keratosis.  CASE COMMENTS STAINS USED IN DIAGNOSIS: H&E    CLINICAL HISTORY  SPECIMEN(S) OBTAINED 1. Skin, Right Lower Leg - Medial  SPECIMEN COMMENTS: 1. 9 mm pink to brown macule with scar-like structures SPECIMEN CLINICAL INFORMATION: 1. Neoplasm of skin, ISK vs melanoma    Gross Description 1. Formalin fixed specimen received:  8 X 6 X 1 MM, TOTO (3 P) (1 B) ( QB )        Report signed out from the following location(s) [place] [address] [place], [place] [address] CLIA #: [id number]  [facility] [address] [place], [place] [address] CLIA #: [id number]       Assessment & Plan:   Problem List Items Addressed This Visit       Cardiovascular and Mediastinum   Senile purpura (HCC)   Reassured patient. Continue to monitor. Call with any concerns.         Respiratory   COPD (chronic obstructive pulmonary disease) with chronic bronchitis (HCC)   Stable. Continue to monitor. Call with any concerns.       Relevant Medications   albuterol  (VENTOLIN  HFA) 108 (90 Base) MCG/ACT inhaler   montelukast  (SINGULAIR ) 10  MG tablet   Other Relevant Orders   CBC with Differential/Platelet   Comprehensive metabolic panel with GFR   TSH     Other   Generalized anxiety disorder   Acting up with concerns about her daughter's house. Will continue current regimen. Continue to monitor. Refills for 3 months given- follow up 3 months.       Relevant Orders   CBC with Differential/Platelet   Comprehensive metabolic panel with GFR   TSH   Hyperlipidemia   Under good control on current regimen. Continue current regimen. Continue to monitor. Call with any concerns. Refills through cardiology. Labs drawn today.        Relevant Orders   CBC with Differential/Platelet   Comprehensive metabolic panel with GFR   Lipid Panel w/o Chol/HDL Ratio   TSH   Vitamin D  deficiency   Rechecking labs today. Await  results. Treat as needed.       Relevant Orders   CBC with Differential/Platelet   Comprehensive metabolic panel with GFR   TSH   VITAMIN D  25 Hydroxy (Vit-D Deficiency, Fractures)   Other Visit Diagnoses       Encounter for annual wellness exam in Medicare patient    -  Primary   Preventative care discussed today as below.     Routine general medical examination at a health care facility       Vaccines up to date/declined. Screening labs checked today. DEXA ordered today. Continue diet and exercise. Call with any concerns.     Postmenopausal estrogen deficiency       DEXA ordered today.   Relevant Orders   DG Bone Density        Follow up plan: Return in about 3 months (around 10/08/2023).   LABORATORY TESTING:  - Pap smear: not applicable  IMMUNIZATIONS:   - Tdap: Tetanus vaccination status reviewed: last tetanus booster within 10 years. - Influenza: Up to date - Pneumovax: Up to date - Prevnar: Up to date - COVID: Refused - HPV: Not applicable - Shingrix vaccine: Refused  SCREENING: -Mammogram: Not applicable  - Colonoscopy: Up to date  - Bone Density: Ordered today   PATIENT COUNSELING:   Advised to take 1 mg of folate supplement per day if capable of pregnancy.   Sexuality: Discussed sexually transmitted diseases, partner selection, use of condoms, avoidance of unintended pregnancy  and contraceptive alternatives.   Advised to avoid cigarette smoking.  I discussed with the patient that most people either abstain from alcohol or drink within safe limits (<=14/week and <=4 drinks/occasion for males, <=7/weeks and <= 3 drinks/occasion for females) and that the risk for alcohol disorders and other health effects rises proportionally with the number of drinks per week and how often a drinker exceeds daily limits.  Discussed cessation/primary prevention of drug use and availability of treatment for abuse.   Diet: Encouraged to adjust caloric intake to maintain  or  achieve ideal body weight, to reduce intake of dietary saturated fat and total fat, to limit sodium intake by avoiding high sodium foods and not adding table salt, and to maintain adequate dietary potassium and calcium  preferably from fresh fruits, vegetables, and low-fat dairy products.    stressed the importance of regular exercise  Injury prevention: Discussed safety belts, safety helmets, smoke detector, smoking near bedding or upholstery.   Dental health: Discussed importance of regular tooth brushing, flossing, and dental visits.    NEXT PREVENTATIVE PHYSICAL DUE IN 1 YEAR. Return in about 3 months (around  10/08/2023).           

## 2023-07-09 NOTE — Assessment & Plan Note (Signed)
 Reassured patient. Continue to monitor. Call with any concerns.

## 2023-07-09 NOTE — Progress Notes (Signed)
 Subjective:   Maureen [name] is a 78 y.o. female who presents for Medicare Annual (Subsequent) preventive examination.  Visit Complete: In person  Patient Medicare AWV questionnaire was completed by the patient on 07/09/23; I have confirmed that all information answered by patient is correct and no changes since this date.        Objective:    Today's Vitals   07/09/23 0950  BP: 130/70  Pulse: 80  SpO2: 97%  Weight: 142 lb 3.2 oz (64.5 kg)  Height: 5\' 3"  (1.6 m)  PainSc: 0-No pain   Body mass index is 25.19 kg/m.     07/09/2023    9:43 AM 11/22/2022   12:58 PM 08/16/2022    8:58 AM 06/07/2022    2:02 PM 11/09/2021    9:24 AM 10/05/2021    6:32 AM 08/03/2021    7:56 AM  Advanced Directives  Does Patient Have a Medical Advance Directive? No No No No No No No  Would patient like information on creating a medical advance directive? No - Patient declined No - Patient declined No - Patient declined No - Patient declined No - Patient declined No - Patient declined     Current Medications (verified) Outpatient Encounter Medications as of 07/09/2023  Medication Sig   acetaminophen  (TYLENOL ) 325 MG tablet Take 650 mg by mouth every 6 (six) hours as needed.   albuterol  (PROVENTIL ) (2.5 MG/3ML) 0.083% nebulizer solution Take 3 mLs (2.5 mg total) by nebulization every 6 (six) hours as needed for wheezing or shortness of breath.   Ascorbic Acid (VITAMIN C) 100 MG tablet Take 100 mg by mouth daily.   Calcium -Vitamins C & D (CALCIUM /C/D PO) Take 1 tablet by mouth daily.   cetirizine (ZYRTEC ALLERGY) 10 MG tablet Take 10 mg by mouth every morning.   Cholecalciferol (VITAMIN D3) 50 MCG (2000 UT) TABS Take 1 tablet by mouth daily at 6 (six) AM.   ezetimibe  (ZETIA ) 10 MG tablet Take 1 tablet (10 mg total) by mouth daily.   fluticasone (FLONASE) 50 MCG/ACT nasal spray Place 2 sprays into the nose every morning.   KRILL OIL PO Take by mouth daily.   lidocaine  4 % Place onto the skin.    omeprazole  (PRILOSEC) 20 MG capsule Take 1 capsule (20 mg total) by mouth daily.   Ruxolitinib Phosphate  (OPZELURA ) 1.5 % CREA Apply to affected skin qd   Vitamin D , Ergocalciferol , (DRISDOL ) 1.25 MG (50000 UNIT) CAPS capsule Take 1 capsule (50,000 Units total) by mouth every 7 (seven) days.   vitamin E 100 UNIT capsule Take 100 Units by mouth daily.   zinc gluconate 50 MG tablet Take 50 mg by mouth as needed.   [DISCONTINUED] albuterol  (VENTOLIN  HFA) 108 (90 Base) MCG/ACT inhaler Inhale 1-2 puffs into the lungs every 6 (six) hours as needed for wheezing or shortness of breath.   [DISCONTINUED] clonazePAM  (KLONOPIN ) 1 MG tablet Take 1 tablet (1 mg total) by mouth 2 (two) times daily as needed for anxiety.   [DISCONTINUED] montelukast  (SINGULAIR ) 10 MG tablet TAKE 1 TABLET BY MOUTH EVERYDAY AT BEDTIME   albuterol  (VENTOLIN  HFA) 108 (90 Base) MCG/ACT inhaler Inhale 1-2 puffs into the lungs every 6 (six) hours as needed for wheezing or shortness of breath.   clonazePAM  (KLONOPIN ) 1 MG tablet Take 1 tablet (1 mg total) by mouth 2 (two) times daily as needed for anxiety.   montelukast  (SINGULAIR ) 10 MG tablet TAKE 1 TABLET BY MOUTH EVERYDAY AT BEDTIME   [DISCONTINUED]  clopidogrel (PLAVIX) 75 MG tablet Take 75 mg by mouth daily. (Patient not taking: Reported on 07/09/2023)   [DISCONTINUED] nicotine (NICODERM CQ - DOSED IN MG/24 HOURS) 21 mg/24hr patch Place onto the skin. (Patient not taking: Reported on 07/09/2023)   [DISCONTINUED] nicotine polacrilex (NICORETTE) 4 MG gum Place inside cheek.   No facility-administered encounter medications on file as of 07/09/2023.    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 This was a histologic grade 2 tumor measuring just under 1 cm in diameter. No invasive cancer was identified. ER 90%, PR 40%.   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   Bronchitis    COPD (chronic obstructive pulmonary disease) (HCC)    Discoid lupus 2005   Emphysematous bleb (HCC)    GERD (gastroesophageal reflux disease)    Heart murmur 2005   Hypercholesterolemia 2012   Lung cancer [facility])    Mammographic microcalcification 2011   Panic attack    Personal history of chemotherapy 1994   Personal history of colonic polyps    Colon polyps   Personal history of other malignant neoplasm of skin 2015   basal cell   Personal history of tobacco use, presenting hazards to health    PUD (peptic ulcer disease)    Last EGD 1/09   Pulmonary nodule    Rectal polyp 2008   villous adenoma; last colonoscopy 1/09 WNL   Skin cancer, basal cell 2015   right cheek Mohs surgery at [facility]   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   COLONOSCOPY W/ BIOPSIES  2005,2009   Dr. Marquita Situ. Normal exam 2009. Villous adenoma of the rectum in 2005 adenomatous polyp from the ascending colon and rectum in 2002.   ESOPHAGOGASTRODUODENOSCOPY N/A 08/05/2014   Procedure: ESOPHAGOGASTRODUODENOSCOPY (EGD);  Surgeon: Marshall Skeeter, MD;  Location: [facility] ENDOSCOPY;  Service: Endoscopy;  Laterality: N/A;    ESOPHAGOGASTRODUODENOSCOPY (EGD) WITH PROPOFOL  N/A 07/25/2017   Procedure: ESOPHAGOGASTRODUODENOSCOPY (EGD) WITH PROPOFOL ;  Surgeon: Marshall Skeeter, MD;  Location: [facility] ENDOSCOPY;  Service: Endoscopy;  Laterality: N/A;   ESOPHAGOGASTRODUODENOSCOPY (EGD) WITH PROPOFOL  N/A 08/03/2021   Procedure: ESOPHAGOGASTRODUODENOSCOPY (EGD) WITH PROPOFOL ;  Surgeon: Marshall Skeeter, MD;  Location: [facility] ENDOSCOPY;  Service: Endoscopy;  Laterality: N/A;   LAPAROSCOPIC BILATERAL SALPINGO OOPHERECTOMY Bilateral 10/05/2021   Procedure: LAPAROSCOPIC BILATERAL SALPINGO OOPHORECTOMY, REMOVAL OF PELVIC MASSES, MINI LAPAROTOMY;  Surgeon: Hermine Loots, MD;  Location: [facility] ORS;  Service: Gynecology;  Laterality: Bilateral;   MASTECTOMY     bilateral   SIMPLE MASTECTOMY  1993   right   SIMPLE MASTECTOMY  2011   left   TONSILLECTOMY  1997   UPPER GI ENDOSCOPY  2003, 2005, 2009, 2012, 2014   2012 showed mild reflux changes without evidence of Barrett's epithelial changes 2014 biopsies at 37 cm showed changes suggestive of reflux esophagitis. No metaplasia.   VAGINAL HYSTERECTOMY  1977   Family History  Problem Relation Age of Onset   Heart attack Father    Cancer Father        esophagus   Cancer Maternal Grandmother        breast cancer   Cancer Maternal Aunt        breast cancer   Social History   Socioeconomic History   Marital status: Widowed    Spouse name: Not on file   Number of children: 2   Years of education: Not on file   Highest education level: Not on file  Occupational History   Occupation: Cytogeneticist: Film[employer]    Comment: [place]  Tobacco Use   Smoking status: Every Day    Current packs/day: 0.50    Average packs/day: 0.5 packs/day for 40.0 years (20.0 ttl pk-yrs)    Types: Cigarettes   Smokeless tobacco: Never   Tobacco comments:    Pt wants to do hypnotizing  Vaping Use   Vaping status: Never Used  Substance and Sexual  Activity   Alcohol use: Yes    Alcohol/week: 1.0 standard drink of alcohol    Types: 1 Cans of beer per week    Comment: 1 beer weekly   Drug use: No   Sexual activity: Not Currently  Other Topics Concern   Not on file  Social History Narrative   [place]ia grew up in [place]. She has two adult daughters. She lives in her home. Her youngest daughter and grandchildren live with her. She has two dogs. 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 She loves watching her grandson play basketball. She also loves gardening and being outdoors and working in the yard.    Social Drivers of Corporate investment banker Strain: Low Risk  (01/16/2023)   Received from [facility]   Overall Financial Resource Strain (CARDIA)    Difficulty of Paying Living Expenses: Not very hard  Food Insecurity: No Food Insecurity (07/09/2023)   Hunger Vital Sign    Worried About Running Out of Food in the Last Year: Never true    Ran Out of Food in the Last Year: Never true  Transportation Needs: No Transportation Needs (01/16/2023)   Received from [facility] - Transportation    Lack of Transportation (Medical): No    Lack of Transportation (Non-Medical): No  Physical Activity: Inactive (07/09/2023)   Exercise Vital Sign    Days of Exercise per Week: 0 days    Minutes of Exercise per Session: 0 min  Stress: Stress Concern Present (07/09/2023)   [employer] of Occupational Health - Occupational Stress Questionnaire    Feeling of Stress : To some extent  Social Connections: Moderately Integrated (07/09/2023)   Social Connection and Isolation Panel [NHANES]    Frequency of Communication with Friends and Family: More than three times a week    Frequency of Social Gatherings with Friends and Family: More than three times a week    Attends Religious Services: More than 4 times per year    Active Member of Clubs or Organizations: Yes    Attends  Club or Organization  Meetings: More than 4 times per year    Marital Status: Widowed    Tobacco Counseling Ready to quit: Not Answered Counseling given: Not Answered Tobacco comments: Pt wants to do hypnotizing   Clinical Intake:     Pain : No/denies pain Pain Score: 0-No pain     Nutritional Status: BMI 25 -29 Overweight Diabetes: No  How often do you need to have someone help you when you read instructions, pamphlets, or other written materials from your doctor or pharmacy?: 1 - Never  Interpreter Needed?: No      Activities of Daily Living    07/09/2023    9:37 AM  In your present state of health, do you have any difficulty performing the following activities:  Hearing? 0  Vision? 1  Comment reading glasses  Difficulty concentrating or making decisions? 0  Walking or climbing stairs? 1  Comment Due to surgery  Dressing or bathing? 0  Doing errands, shopping? 0  Preparing Food and eating ? N  Using the Toilet? N  In the past six months, have you accidently leaked urine? N  Do you have problems with loss of bowel control? N  Managing your Medications? N  Managing your Finances? N  Housekeeping or managing your Housekeeping? N    Patient Care Team: Solomon Dupre, DO as PCP - General (Family Medicine) Audery Blazing Deannie Fabian, MD as PCP - Cardiology (Cardiology) Dasher, Margette Sheldon, MD (Dermatology) Pa,  Eye Care (Optometry) Jerelene Monday, Deadra Everts, MD as Consulting Physician (Cardiology) Erskin Hearing, MD as Consulting Physician (Pulmonary Disease) Glenis Langdon, MD as Consulting Physician (Radiation Oncology) Rochell Chroman, RN as Oncology Nurse Navigator  Indicate any recent Medical Services you may have received from other than [facility] providers in the past year (date may be approximate).     Assessment:   This is a routine wellness examination for [place].  Hearing/Vision screen No results found.   Depression Screen    07/09/2023   10:23 AM 07/09/2023    9:55 AM  05/30/2022   12:10 PM 02/08/2022   10:25 AM 11/08/2021    2:53 PM 08/08/2021    2:01 PM 01/19/2021    9:21 AM  PHQ 2/9 Scores  PHQ - 2 Score 0 0 0 0 3 0 0  PHQ- 9 Score 0 0 0 0 9 5 1     Fall Risk    07/09/2023    9:55 AM 05/30/2022   12:10 PM 02/08/2022   10:25 AM 01/19/2021    9:13 AM 01/06/2020    2:55 PM  Fall Risk   Falls in the past year? 0 1 0 0 0  Number falls in past yr: 0 0 0 0 0  Injury with Fall? 0 0 0 0 0  Risk for fall due to :   No Fall Risks No Fall Risks No Fall Risks  Follow up  Falls evaluation completed Falls evaluation completed Falls evaluation completed Falls evaluation completed    MEDICARE RISK AT HOME: Medicare Risk at Home Any stairs in or around the home?: Yes If so, are there any without handrails?: Yes Home free of loose throw rugs in walkways, pet beds, electrical cords, etc?: No Adequate lighting in your home to reduce risk of falls?: Yes Life alert?: No Use of a cane, walker or w/c?: No Grab bars in the bathroom?: Yes Shower chair or bench in shower?: Yes Elevated toilet seat or a handicapped toilet?: No  TIMED UP AND  GO:  Was the test performed?  Yes  Length of time to ambulate 10 feet: 10 sec Gait steady and fast without use of assistive device    Cognitive Function:        07/09/2023    9:40 AM 05/30/2022   12:11 PM 05/25/2016   10:17 AM  6CIT Screen  What Year? 0 points 0 points 0 points  What month? 0 points 0 points 0 points  What time? 0 points 0 points 0 points  Count back from 20 0 points 0 points 0 points  Months in reverse 0 points 0 points 0 points  Repeat phrase 0 points 2 points 2 points  Total Score 0 points 2 points 2 points    Immunizations Immunization History  Administered Date(s) Administered   Fluad Quad(high Dose 65+) 01/19/2021, 02/08/2022   Fluad Trivalent(High Dose 65+) 11/29/2022   Influenza Split 12/18/2012, 01/06/2014, 12/19/2015   Influenza,inj,Quad PF,6+ Mos 01/11/2012, 01/12/2014, 01/10/2017,  01/03/2018, 02/05/2019, 01/06/2020   Influenza-Unspecified 01/16/2018   Moderna Sars-Covid-2 Vaccination 04/01/2019, 04/29/2019   Pneumococcal Conjugate-13 02/18/2014   Pneumococcal Polysaccharide-23 09/16/2008, 05/23/2016   Td 02/08/2022   Tdap 04/23/2012    TDAP status: Up to date  Flu Vaccine status: Up to date  Pneumococcal vaccine status: Up to date  Covid-19 vaccine status: Declined, Education has been provided regarding the importance of this vaccine but patient still declined. Advised may receive this vaccine at local pharmacy or Health Dept.or vaccine clinic. Aware to provide a copy of the vaccination record if obtained from local pharmacy or Health Dept. Verbalized acceptance and understanding.  Qualifies for Shingles Vaccine? Yes   Zostavax completed No   Declined  Screening Tests Health Maintenance  Topic Date Due   Zoster Vaccines- Shingrix (1 of 2) Never done   INFLUENZA VACCINE  10/19/2023   Lung Cancer Screening  05/21/2024   Medicare Annual Wellness (AWV)  07/08/2024   Colonoscopy  09/28/2024   DTaP/Tdap/Td (3 - Td or Tdap) 02/09/2032   Pneumonia Vaccine 88+ Years old  Completed   DEXA SCAN  Completed   Hepatitis C Screening  Completed   HPV VACCINES  Aged Out   Meningococcal B Vaccine  Aged Out   COVID-19 Vaccine  Discontinued    Health Maintenance  Health Maintenance Due  Topic Date Due   Zoster Vaccines- Shingrix (1 of 2) Never done    Colorectal cancer screening: Type of screening: Colonoscopy. Completed 09/29/19. Repeat every 5 years  Mammogram status: No longer required due to mastectomy.  Bone Density status: Ordered today. Pt provided with contact info and advised to call to schedule appt.  Lung Cancer Screening: (Low Dose CT Chest recommended if Age 24-80 years, 20 pack-year currently smoking OR have quit w/in 15years.) does qualify.    Additional Screening:  Hepatitis C Screening: does qualify; Completed 05/26/16  Vision Screening:  Recommended annual ophthalmology exams for early detection of glaucoma and other disorders of the eye.  Dental Screening: Recommended annual dental exams for proper oral hygiene  Community Resource Referral / Chronic Care Management: CRR required this visit?  No   CCM required this visit?  No     Plan:     I have personally reviewed and noted the following in the patient's chart:   Medical and social history Use of alcohol, tobacco or illicit drugs  Current medications and supplements including opioid prescriptions. Patient is not currently taking opioid prescriptions. Functional ability and status Nutritional status Physical activity Advanced directives List of  other physicians Hospitalizations, surgeries, and ER visits in previous 12 months Vitals Screenings to include cognitive, depression, and falls Referrals and appointments  In addition, I have reviewed and discussed with patient certain preventive protocols, quality metrics, and best practice recommendations. A written personalized care plan for preventive services as well as general preventive health recommendations were provided to patient.     Terre Ferri, DO   07/09/2023   After Visit Summary: (In Person-Printed) AVS printed and given to the patient  Nurse Notes: N/A

## 2023-07-09 NOTE — Patient Instructions (Signed)
  Ms. Wolf , Thank you for taking time to come for your Medicare Wellness Visit. I appreciate your ongoing commitment to your health goals. Please review the following plan we discussed and let me know if I can assist you in the future.    This is a list of the screening recommended for you and due dates:  Health Maintenance  Topic Date Due   Zoster (Shingles) Vaccine (1 of 2) Never done   Flu Shot  10/19/2023   Screening for Lung Cancer  05/21/2024   Medicare Annual Wellness Visit  07/08/2024   Colon Cancer Screening  09/28/2024   DTaP/Tdap/Td vaccine (3 - Td or Tdap) 02/09/2032   Pneumonia Vaccine  Completed   DEXA scan (bone density measurement)  Completed   Hepatitis C Screening  Completed   HPV Vaccine  Aged Out   Meningitis B Vaccine  Aged Out   COVID-19 Vaccine  Discontinued    Please call to schedule your bone density: [facility]  Address: [address], [place], [place] [address] Phone: [phone]  [facility] [address] [place],  [place]  [address] Phone: [phone]

## 2023-07-09 NOTE — Assessment & Plan Note (Signed)
 Rechecking labs today. Await results. Treat as needed.

## 2023-07-10 ENCOUNTER — Encounter: Payer: Self-pay | Admitting: Family Medicine

## 2023-07-10 ENCOUNTER — Other Ambulatory Visit: Payer: Self-pay | Admitting: Family Medicine

## 2023-07-10 LAB — CBC WITH DIFFERENTIAL/PLATELET
Basophils Absolute: 0 10*3/uL (ref 0.0–0.2)
Basos: 1 %
EOS (ABSOLUTE): 0.2 10*3/uL (ref 0.0–0.4)
Eos: 3 %
Hematocrit: 36.3 % (ref 34.0–46.6)
Hemoglobin: 11.6 g/dL (ref 11.1–15.9)
Immature Grans (Abs): 0 10*3/uL (ref 0.0–0.1)
Immature Granulocytes: 0 %
Lymphocytes Absolute: 1.5 10*3/uL (ref 0.7–3.1)
Lymphs: 24 %
MCH: 29.6 pg (ref 26.6–33.0)
MCHC: 32 g/dL (ref 31.5–35.7)
MCV: 93 fL (ref 79–97)
Monocytes Absolute: 0.4 10*3/uL (ref 0.1–0.9)
Monocytes: 7 %
Neutrophils Absolute: 4.1 10*3/uL (ref 1.4–7.0)
Neutrophils: 65 %
Platelets: 232 10*3/uL (ref 150–450)
RBC: 3.92 x10E6/uL (ref 3.77–5.28)
RDW: 14.1 % (ref 11.7–15.4)
WBC: 6.3 10*3/uL (ref 3.4–10.8)

## 2023-07-10 LAB — COMPREHENSIVE METABOLIC PANEL WITH GFR
ALT: 12 IU/L (ref 0–32)
AST: 12 IU/L (ref 0–40)
Albumin: 4.1 g/dL (ref 3.8–4.8)
Alkaline Phosphatase: 144 IU/L — ABNORMAL HIGH (ref 44–121)
BUN/Creatinine Ratio: 14 (ref 12–28)
BUN: 18 mg/dL (ref 8–27)
Bilirubin Total: <0.2 mg/dL (ref 0.0–1.2)
CO2: 21 mmol/L (ref 20–29)
Calcium: 9.8 mg/dL (ref 8.7–10.3)
Chloride: 104 mmol/L (ref 96–106)
Creatinine, Ser: 1.25 mg/dL — ABNORMAL HIGH (ref 0.57–1.00)
Globulin, Total: 2.4 g/dL (ref 1.5–4.5)
Glucose: 82 mg/dL (ref 70–99)
Potassium: 4.4 mmol/L (ref 3.5–5.2)
Sodium: 139 mmol/L (ref 134–144)
Total Protein: 6.5 g/dL (ref 6.0–8.5)
eGFR: 44 mL/min/{1.73_m2} — ABNORMAL LOW (ref >59)

## 2023-07-10 LAB — LIPID PANEL W/O CHOL/HDL RATIO
Cholesterol, Total: 193 mg/dL (ref 100–199)
HDL: 46 mg/dL (ref >39)
LDL Chol Calc (NIH): 126 mg/dL — ABNORMAL HIGH (ref 0–99)
Triglycerides: 118 mg/dL (ref 0–149)
VLDL Cholesterol Cal: 21 mg/dL (ref 5–40)

## 2023-07-10 LAB — VITAMIN D 25 HYDROXY (VIT D DEFICIENCY, FRACTURES): Vit D, 25-Hydroxy: 17 ng/mL — ABNORMAL LOW (ref 30.0–100.0)

## 2023-07-10 LAB — TSH: TSH: 0.97 u[IU]/mL (ref 0.450–4.500)

## 2023-07-10 MED ORDER — VITAMIN D (ERGOCALCIFEROL) 1.25 MG (50000 UNIT) PO CAPS: 50000.0000 [IU] | ORAL_CAPSULE | ORAL | 1 refills | Status: DC

## 2023-07-12 ENCOUNTER — Ambulatory Visit (INDEPENDENT_AMBULATORY_CARE_PROVIDER_SITE_OTHER)

## 2023-07-12 DIAGNOSIS — H6121 Impacted cerumen, right ear: Secondary | ICD-10-CM | POA: Diagnosis not present

## 2023-07-12 NOTE — Progress Notes (Signed)
 Patient is in office today for a nurse visit for Ear Lavage. Patient ear Irrigation was preformed on right ear.  Ear fully cleaned out. Pt pain relieved.

## 2023-07-26 NOTE — Progress Notes (Signed)
 HPI: Follow-up abdominal aortic aneurysm.  Calcium  score November 2016 0.  ABIs August 2023 mild on the left.  Nuclear study March 2024 showed ejection fraction 70% and no ischemia.  Carotid Dopplers October 2024 showed less than 50% bilateral stenosis.  Echocardiogram October 2024 showed normal LV function.  Patient had right iliofemoral bypass conduit January 15, 2023.  She had four-vessel FEVAR (CA, SMA, RRA, LRA) and distal aortic device February 20, 2023.  CTA January 2025 showed stable abdominal aortic endovascular stent graft and no evidence of endoleak; decrease in size of left upper lobe spiculated nodule.  Since last seen she denies dyspnea, chest pain, palpitations or syncope.  Current Outpatient Medications  Medication Sig Dispense Refill   acetaminophen  (TYLENOL ) 325 MG tablet Take 650 mg by mouth every 6 (six) hours as needed.     albuterol  (PROVENTIL ) (2.5 MG/3ML) 0.083% nebulizer solution Take 3 mLs (2.5 mg total) by nebulization every 6 (six) hours as needed for wheezing or shortness of breath. 150 mL 1   albuterol  (VENTOLIN  HFA) 108 (90 Base) MCG/ACT inhaler Inhale 1-2 puffs into the lungs every 6 (six) hours as needed for wheezing or shortness of breath. 18 each 6   Ascorbic Acid (VITAMIN C) 100 MG tablet Take 100 mg by mouth daily.     cetirizine (ZYRTEC ALLERGY) 10 MG tablet Take 10 mg by mouth every morning.     Cholecalciferol (VITAMIN D3) 50 MCG (2000 UT) TABS Take 1 tablet by mouth daily at 6 (six) AM.     clonazePAM  (KLONOPIN ) 1 MG tablet Take 1 tablet (1 mg total) by mouth 2 (two) times daily as needed for anxiety. 60 tablet 2   ezetimibe  (ZETIA ) 10 MG tablet Take 1 tablet (10 mg total) by mouth daily. 90 tablet 3   fluticasone (FLONASE) 50 MCG/ACT nasal spray Place 2 sprays into the nose every morning.     KRILL OIL PO Take by mouth daily.     montelukast  (SINGULAIR ) 10 MG tablet TAKE 1 TABLET BY MOUTH EVERYDAY AT BEDTIME 90 tablet 1   omeprazole  (PRILOSEC) 20 MG  capsule Take 1 capsule (20 mg total) by mouth daily. 90 capsule 1   Ruxolitinib Phosphate  (OPZELURA ) 1.5 % CREA Apply to affected skin qd 60 g 2   Vitamin D , Ergocalciferol , (DRISDOL ) 1.25 MG (50000 UNIT) CAPS capsule Take 1 capsule (50,000 Units total) by mouth every 7 (seven) days. 12 capsule 1   vitamin E 100 UNIT capsule Take 100 Units by mouth daily.     zinc gluconate 50 MG tablet Take 50 mg by mouth as needed.     lidocaine  4 % Place onto the skin. (Patient not taking: Reported on 08/09/2023)     No current facility-administered medications for this visit.     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 This was a histologic grade 2 tumor measuring just under 1 cm in diameter. No invasive cancer was identified. ER 90%, PR 40%.   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   Bronchitis    COPD (chronic obstructive pulmonary  disease) (HCC)    Discoid lupus 2005   Emphysematous bleb (HCC)    GERD (gastroesophageal reflux disease)    Heart murmur 2005   Hypercholesterolemia 2012   Lung cancer [facility])    Mammographic microcalcification 2011   Panic attack    Personal history of chemotherapy 1994   Personal history of colonic polyps    Colon polyps   Personal history of other malignant neoplasm of skin 2015   basal cell   Personal history of tobacco use, presenting hazards to health    PUD (peptic ulcer disease)    Last EGD 1/09   Pulmonary nodule    Rectal polyp 2008   villous adenoma; last colonoscopy 1/09 WNL   Skin cancer, basal cell 2015   right cheek [facility]   Squamous cell  carcinoma of skin 03/09/2022   KA type, back   Tobacco use    Ulcer     Past Surgical History:  Procedure Laterality Date   APPENDECTOMY  2011   BREAST BIOPSY  12/27/2009   right breast   CARDIAC CATHETERIZATION  1996   Negative Cath   COLONOSCOPY N/A 08/05/2014   Procedure: COLONOSCOPY;  Surgeon: Marshall Skeeter, MD;  Location: [facility] ENDOSCOPY;  EGD, tubular adenoma 1.   COLONOSCOPY W/ BIOPSIES  2005,2009   Dr. Marquita Situ. Normal exam 2009. Villous adenoma of the rectum in 2005 adenomatous polyp from the ascending colon and rectum in 2002.   ESOPHAGOGASTRODUODENOSCOPY N/A 08/05/2014   Procedure: ESOPHAGOGASTRODUODENOSCOPY (EGD);  Surgeon: Marshall Skeeter, MD;  Location[facility] ENDOSCOPY;  Service: Endoscopy;  Laterality: N/A;   ESOPHAGOGASTRODUODENOSCOPY (EGD) WITH PROPOFOL  N/A 07/25/2017   Procedure: ESOPHAGOGASTRODUODENOSCOPY (EGD) WITH PROPOFOL ;  Surgeon: Marshall Skeeter, MD;  Location: [facility] ENDOSCOPY;  Service: Endoscopy;  Laterality: N/A;   ESOPHAGOGASTRODUODENOSCOPY (EGD) WITH PROPOFOL  N/A 08/03/2021   Procedure: ESOPHAGOGASTRODUODENOSCOPY (EGD) WITH PROPOFOL ;  Surgeon: Marshall Skeeter, MD;  Location: [facility] ENDOSCOPY;  Service: Endoscopy;  Laterality: N/A;   LAPAROSCOPIC BILATERAL SALPINGO OOPHERECTOMY Bilateral 10/05/2021   Procedure: LAPAROSCOPIC BILATERAL SALPINGO OOPHORECTOMY, REMOVAL OF PELVIC MASSES, MINI LAPAROTOMY;  Surgeon: Hermine Loots, MD;  Location: [facility] ORS;  Service: Gynecology;  Laterality: Bilateral;   MASTECTOMY     bilateral   SIMPLE MASTECTOMY  1993   right   SIMPLE MASTECTOMY  2011   left   TONSILLECTOMY  1997   UPPER GI ENDOSCOPY  2003, 2005, 2009, 2012, 2014   2012 showed mild reflux changes without evidence of Barrett's epithelial changes 2014 biopsies at 37 cm showed changes suggestive of reflux esophagitis. No metaplasia.   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 [employer]    Comment: [place] [employer]  Tobacco Use   Smoking status: Every Day    Current packs/day: 0.50    Average packs/day: 0.5 packs/day for 40.0 years (20.0 ttl pk-yrs)    Types: Cigarettes   Smokeless tobacco: Never   Tobacco comments:    Pt wants to do hypnotizing  Vaping Use   Vaping status: Never Used  Substance and Sexual Activity   Alcohol use: Yes    Alcohol/week: 1.0 standard drink of alcohol    Types: 1  Cans of beer per week    Comment: 1 beer weekly   Drug use: No   Sexual activity: Not Currently  Other Topics Concern   Not on file  Social History Narrative   Pricila grew up in [place]. She has two adult daughters. She lives in her home. Her youngest daughter and grandchildren live with her. She has two dogs. 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 She loves watching her grandson play basketball. She also loves gardening and being outdoors and working in the yard.    Social Drivers of Corporate investment banker Strain: Low Risk  (01/16/2023)   Received from [facility]   Overall Financial Resource Strain (CARDIA)    Difficulty of Paying Living Expenses: Not very hard  Food Insecurity: No Food Insecurity (07/09/2023)   Hunger Vital Sign    Worried About Running Out of Food in the Last Year: Never true    Ran Out of Food in the Last Year: Never true  Transportation Needs: No Transportation Needs (01/16/2023)   [facility] - Transportation    Lack of Transportation (Medical): No    Lack of Transportation (Non-Medical): No  Physical Activity: Inactive (07/09/2023)   Exercise Vital Sign    Days of Exercise per Week: 0 days    Minutes of Exercise per Session: 0 min  Stress: Stress Concern Present  (07/09/2023)   [employer] of Occupational Health - Occupational Stress Questionnaire    Feeling of Stress : To some extent  Social Connections: Moderately Integrated (07/09/2023)   Social Connection and Isolation Panel [NHANES]    Frequency of Communication with Friends and Family: More than three times a week    Frequency of Social Gatherings with Friends and Family: More than three times a week    Attends Religious Services: More than 4 times per year    Active Member of [employer] or Organizations: Yes    Attends Banker Meetings: More than 4 times per year    Marital Status: Widowed  Intimate Partner Violence: Unknown (01/18/2023)   Humiliation, Afraid, Rape, and Kick questionnaire    Fear of Current or Ex-Partner: No    Emotionally Abused: No    Physically Abused: Not on file    Sexually Abused: No    Family History  Problem Relation Age of Onset   Heart attack Father    Cancer Father        esophagus   Cancer Maternal Grandmother        breast cancer   Cancer Maternal Aunt        breast cancer    ROS: no fevers or chills, productive cough, hemoptysis, dysphasia, odynophagia, melena, hematochezia, dysuria, hematuria, rash, seizure activity, orthopnea, PND, pedal edema, claudication. Remaining systems are negative.  Physical Exam: Well-developed well-nourished in no acute distress.  Skin is warm and dry.  HEENT is normal.  Neck is supple.  Chest is clear to auscultation with normal expansion.  Cardiovascular exam is regular rate and rhythm.  Abdominal exam nontender or distended. No masses palpated. Extremities show no edema. neuro grossly intact   A/P  1 peripheral vascular disease-patient is status post abdominal aortic aneurysm repair and is followed by vascular surgery.  Continue aspirin ; intolerant to statins.  2 hypertension-patient's blood pressure is controlled.  Continue present medications.  3 tobacco abuse-patient counseled on  discontinuing.  4 hyperlipidemia-patient is intolerant to statins.  Continue Zetia .  Most recent LDL not at goal.  I recommended initiating PCSK9 inhibitor but she declined.  5 history of lung cancer-Per oncology.  Alexandria Angel, MD

## 2023-08-09 ENCOUNTER — Encounter: Payer: Self-pay | Admitting: Cardiology

## 2023-08-09 ENCOUNTER — Ambulatory Visit: Payer: Medicare HMO | Attending: Cardiology | Admitting: Cardiology

## 2023-08-09 VITALS — BP 132/64 | HR 93 | Ht 63.0 in | Wt 140.0 lb

## 2023-08-09 DIAGNOSIS — E782 Mixed hyperlipidemia: Secondary | ICD-10-CM

## 2023-08-09 DIAGNOSIS — I7142 Juxtarenal abdominal aortic aneurysm, without rupture: Secondary | ICD-10-CM | POA: Diagnosis not present

## 2023-08-09 DIAGNOSIS — Z720 Tobacco use: Secondary | ICD-10-CM

## 2023-08-09 NOTE — Patient Instructions (Signed)

## 2023-08-28 ENCOUNTER — Ambulatory Visit

## 2023-09-05 ENCOUNTER — Other Ambulatory Visit: Payer: Self-pay | Admitting: Radiation Oncology

## 2023-09-05 ENCOUNTER — Other Ambulatory Visit: Payer: Self-pay | Admitting: Cardiology

## 2023-09-05 DIAGNOSIS — E782 Mixed hyperlipidemia: Secondary | ICD-10-CM

## 2023-10-22 ENCOUNTER — Encounter: Payer: Medicare HMO | Admitting: Dermatology

## 2023-10-31 ENCOUNTER — Ambulatory Visit (INDEPENDENT_AMBULATORY_CARE_PROVIDER_SITE_OTHER): Admitting: Family Medicine

## 2023-10-31 ENCOUNTER — Encounter: Payer: Self-pay | Admitting: Family Medicine

## 2023-10-31 VITALS — BP 144/78 | HR 85 | Temp 98.0°F | Ht 63.0 in | Wt 143.0 lb

## 2023-10-31 DIAGNOSIS — G8928 Other chronic postprocedural pain: Secondary | ICD-10-CM | POA: Diagnosis not present

## 2023-10-31 DIAGNOSIS — F411 Generalized anxiety disorder: Secondary | ICD-10-CM

## 2023-10-31 MED ORDER — LIDOCAINE 5 % EX OINT
1.0000 | TOPICAL_OINTMENT | CUTANEOUS | 3 refills | Status: AC | PRN
Start: 2023-10-31 — End: ?

## 2023-10-31 NOTE — Assessment & Plan Note (Signed)
 Stable. Taking her medicine less often. Will continue current regimen. Has 2 refills of her klonopin  at the pharmacy. Will hold on refills at this time. Call with any concerns.

## 2023-10-31 NOTE — Progress Notes (Signed)
 BP (!) 144/78   Pulse 85   Temp 98 F (36.7 C) (Oral)   Ht 5' 3 (1.6 m)   Wt 143 lb (64.9 kg)   SpO2 97%   BMI 25.33 kg/m    Subjective:    Patient ID: Maureen [name], female    DOB: 1946/03/14, 78 y.o.   MRN: [id number]  HPI: Maureen [name] is a 78 y.o. female  Chief Complaint  Patient presents with   Anxiety   Continues with a lot of abdominal discomfort after her surgeries earlier this year.   ANXIETY/STRESS- has been taking her klonopin  less often. She has been doing well without it.  Duration: chronic Status:stable Anxious mood: yes  Excessive worrying: yes Irritability: no  Sweating: no Nausea: no Palpitations:no Hyperventilation: no Panic attacks: no Agoraphobia: no  Obscessions/compulsions: no Depressed mood: no    07/09/2023   10:23 AM 07/09/2023    9:55 AM 05/30/2022   12:10 PM 02/08/2022   10:25 AM 11/08/2021    2:53 PM  Depression screen PHQ 2/9  Decreased Interest 0 0 0 0 1  Down, Depressed, Hopeless 0 0 0 0 2  PHQ - 2 Score 0 0 0 0 3  Altered sleeping 0 0 0 0 1  Tired, decreased energy 0 0 0 0 2  Change in appetite 0 0 0 0 2  Feeling bad or failure about yourself  0 0 0 0 0  Trouble concentrating 0 0 0 0 1  Moving slowly or fidgety/restless 0 0 0 0 0  Suicidal thoughts 0 0 0 0 0  PHQ-9 Score 0 0 0 0 9  Difficult doing work/chores   Not difficult at all     Anhedonia: no Weight changes: no Insomnia: no   Hypersomnia: no Fatigue/loss of energy: no Feelings of worthlessness: no Feelings of guilt: no Impaired concentration/indecisiveness: no Suicidal ideations: no  Crying spells: no Recent Stressors/Life Changes: no   Relationship problems: no   Family stress: no     Financial stress: no    Job stress: no    Recent death/loss: no  Relevant past medical, surgical, family and social history reviewed and updated as indicated. Interim medical history since our last visit reviewed. Allergies and medications reviewed and  updated.  Review of Systems  Constitutional: Negative.   Respiratory: Negative.    Cardiovascular: Negative.   Musculoskeletal: Negative.   Psychiatric/Behavioral:  Negative for agitation, behavioral problems, confusion, decreased concentration, dysphoric mood, hallucinations, self-injury, sleep disturbance and suicidal ideas. The patient is nervous/anxious. The patient is not hyperactive.     Per HPI unless specifically indicated above     Objective:    BP (!) 144/78   Pulse 85   Temp 98 F (36.7 C) (Oral)   Ht 5' 3 (1.6 m)   Wt 143 lb (64.9 kg)   SpO2 97%   BMI 25.33 kg/m   Wt Readings from Last 3 Encounters:  10/31/23 143 lb (64.9 kg)  08/09/23 140 lb (63.5 kg)  07/09/23 142 lb 3.2 oz (64.5 kg)    Physical Exam Vitals and nursing note reviewed.  Constitutional:      General: She is not in acute distress.    Appearance: Normal appearance. She is not ill-appearing, toxic-appearing or diaphoretic.  HENT:     Head: Normocephalic and atraumatic.     Right Ear: External ear normal.     Left Ear: External ear normal.     Nose: Nose normal.  Mouth/Throat:     Mouth: Mucous membranes are moist.     Pharynx: Oropharynx is clear.  Eyes:     General: No scleral icterus.       Right eye: No discharge.        Left eye: No discharge.     Extraocular Movements: Extraocular movements intact.     Conjunctiva/sclera: Conjunctivae normal.     Pupils: Pupils are equal, round, and reactive to light.  Cardiovascular:     Rate and Rhythm: Normal rate and regular rhythm.     Pulses: Normal pulses.     Heart sounds: Normal heart sounds. No murmur heard.    No friction rub. No gallop.  Pulmonary:     Effort: Pulmonary effort is normal. No respiratory distress.     Breath sounds: Normal breath sounds. No stridor. No wheezing, rhonchi or rales.  Chest:     Chest wall: No tenderness.  Musculoskeletal:        General: Normal range of motion.     Cervical back: Normal range of  motion and neck supple.  Skin:    General: Skin is warm and dry.     Capillary Refill: Capillary refill takes less than 2 seconds.     Coloration: Skin is not jaundiced or pale.     Findings: No bruising, erythema, lesion or rash.  Neurological:     General: No focal deficit present.     Mental Status: She is alert and oriented to person, place, and time. Mental status is at baseline.  Psychiatric:        Mood and Affect: Mood normal.        Behavior: Behavior normal.        Thought Content: Thought content normal.        Judgment: Judgment normal.     Results for orders placed or performed in visit on 07/09/23  CBC with Differential/Platelet   Collection Time: 07/09/23 10:11 AM  Result Value Ref Range   WBC 6.3 3.4 - 10.8 x10E3/uL   RBC 3.92 3.77 - 5.28 x10E6/uL   Hemoglobin 11.6 11.1 - 15.9 g/dL   Hematocrit 63.6 65.9 - 46.6 %   MCV 93 79 - 97 fL   MCH 29.6 26.6 - 33.0 pg   MCHC 32.0 31.5 - 35.7 g/dL   RDW 85.8 88.2 - 84.5 %   Platelets 232 150 - 450 x10E3/uL   Neutrophils 65 Not Estab. %   Lymphs 24 Not Estab. %   Monocytes 7 Not Estab. %   Eos 3 Not Estab. %   Basos 1 Not Estab. %   Neutrophils Absolute 4.1 1.4 - 7.0 x10E3/uL   Lymphocytes Absolute 1.5 0.7 - 3.1 x10E3/uL   Monocytes Absolute 0.4 0.1 - 0.9 x10E3/uL   EOS (ABSOLUTE) 0.2 0.0 - 0.4 x10E3/uL   Basophils Absolute 0.0 0.0 - 0.2 x10E3/uL   Immature Granulocytes 0 Not Estab. %   Immature Grans (Abs) 0.0 0.0 - 0.1 x10E3/uL  Comprehensive metabolic panel with GFR   Collection Time: 07/09/23 10:11 AM  Result Value Ref Range   Glucose 82 70 - 99 mg/dL   BUN 18 8 - 27 mg/dL   Creatinine, Ser 8.74 (H) 0.57 - 1.00 mg/dL   eGFR 44 (L) >40 fO/fpw/8.26   BUN/Creatinine Ratio 14 12 - 28   Sodium 139 134 - 144 mmol/L   Potassium 4.4 3.5 - 5.2 mmol/L   Chloride 104 96 - 106 mmol/L   CO2 21 20 -  29 mmol/L   Calcium  9.8 8.7 - 10.3 mg/dL   Total Protein 6.5 6.0 - 8.5 g/dL   Albumin 4.1 3.8 - 4.8 g/dL   Globulin,  Total 2.4 1.5 - 4.5 g/dL   Bilirubin Total <9.7 0.0 - 1.2 mg/dL   Alkaline Phosphatase 144 (H) 44 - 121 IU/L   AST 12 0 - 40 IU/L   ALT 12 0 - 32 IU/L  Lipid Panel w/o Chol/HDL Ratio   Collection Time: 07/09/23 10:11 AM  Result Value Ref Range   Cholesterol, Total 193 100 - 199 mg/dL   Triglycerides 881 0 - 149 mg/dL   HDL 46 >60 mg/dL   VLDL Cholesterol Cal 21 5 - 40 mg/dL   LDL Chol Calc (NIH) 873 (H) 0 - 99 mg/dL  TSH   Collection Time: 07/09/23 10:11 AM  Result Value Ref Range   TSH 0.970 0.450 - 4.500 uIU/mL  VITAMIN D  25 Hydroxy (Vit-D Deficiency, Fractures)   Collection Time: 07/09/23 10:11 AM  Result Value Ref Range   Vit D, 25-Hydroxy 17.0 (L) 30.0 - 100.0 ng/mL      Assessment & Plan:   Problem List Items Addressed This Visit       Other   Generalized anxiety disorder - Primary   Stable. Taking her medicine less often. Will continue current regimen. Has 2 refills of her klonopin  at the pharmacy. Will hold on refills at this time. Call with any concerns.       Other Visit Diagnoses       Chronic post-operative pain       Will treat with lidoderm . Call with any concerns. Continue to monitor.        Follow up plan: Return in about 2 months (around 01/08/2024) for 6 month follow up.

## 2023-12-10 ENCOUNTER — Ambulatory Visit
Admission: RE | Admit: 2023-12-10 | Discharge: 2023-12-10 | Disposition: A | Discharge status: Home / Self Care | Source: Ambulatory Visit | Attending: Radiation Oncology | Admitting: Radiation Oncology

## 2023-12-10 DIAGNOSIS — C3490 Malignant neoplasm of unspecified part of unspecified bronchus or lung: Secondary | ICD-10-CM | POA: Diagnosis present

## 2023-12-10 LAB — POCT I-STAT CREATININE: Creatinine, Ser: 1.5 mg/dL — ABNORMAL HIGH (ref 0.44–1.00)

## 2023-12-10 MED ORDER — IOHEXOL 300 MG/ML  SOLN
75.0000 mL | Freq: Once | INTRAMUSCULAR | Status: AC | PRN
  Administered 2023-12-10: 60 mL via INTRAVENOUS

## 2023-12-13 ENCOUNTER — Encounter: Payer: Self-pay | Admitting: Family Medicine

## 2023-12-24 ENCOUNTER — Ambulatory Visit
Admission: RE | Admit: 2023-12-24 | Discharge: 2023-12-24 | Disposition: A | Discharge status: Home / Self Care | Source: Ambulatory Visit | Attending: Radiation Oncology | Admitting: Radiation Oncology

## 2023-12-24 ENCOUNTER — Encounter: Payer: Self-pay | Admitting: Radiation Oncology

## 2023-12-24 VITALS — BP 166/93 | HR 86 | Temp 97.0°F | Resp 16 | Ht 63.0 in | Wt 147.5 lb

## 2023-12-24 DIAGNOSIS — R918 Other nonspecific abnormal finding of lung field: Secondary | ICD-10-CM | POA: Insufficient documentation

## 2023-12-24 DIAGNOSIS — Z923 Personal history of irradiation: Secondary | ICD-10-CM | POA: Insufficient documentation

## 2023-12-24 DIAGNOSIS — R911 Solitary pulmonary nodule: Secondary | ICD-10-CM

## 2023-12-24 NOTE — Progress Notes (Signed)
 Radiation Oncology Follow up Note  Name: Maureen [name]   Date:   12/24/2023 MRN:  [id number] DOB: [birth year]    This 78 y.o. female presents to the clinic today for 50-month follow-up status post SBRT to her left upper lobe for presumed stage I non-small cell lung cancer.  REFERRING PROVIDER: Vicci Duwaine SQUIBB, DO  HPI: Patient is a 78 year old female now out 16 months have completed SBRT to her left upper lobe for presumed stage I non-small cell lung cancer seen today in routine follow-up from a pulmonary standpoint she is doing well has a mild nonproductive cough no hemoptysis chest tightness change in her pulmonary status or hemoptysis.  She did have a recent CT scan.  Showing interval increase in irregular ground glass and consolidation in the apical left upper lobe with a treated nodule no longer discretely visible.  No evidence of metastatic disease or lymphadenopathy was noted.  Patient is also had aneurysm surgery in the past year as well as ovarian surgery for benign tumors.  COMPLICATIONS OF TREATMENT: none  FOLLOW UP COMPLIANCE: keeps appointments   PHYSICAL EXAM:  BP (!) 166/93   Pulse 86   Temp (!) 97 F (36.1 C)   Resp 16   Ht 5' 3 (1.6 m)   Wt 147 lb 8 oz (66.9 kg)   BMI 26.13 kg/m  Well-developed well-nourished patient in NAD. HEENT reveals PERLA, EOMI, discs not visualized.  Oral cavity is clear. No oral mucosal lesions are identified. Neck is clear without evidence of cervical or supraclavicular adenopathy. Lungs are clear to A&P. Cardiac examination is essentially unremarkable with regular rate and rhythm without murmur rub or thrill. Abdomen is benign with no organomegaly or masses noted. Motor sensory and DTR levels are equal and symmetric in the upper and lower extremities. Cranial nerves II through XII are grossly intact. Proprioception is intact. No peripheral adenopathy or edema is identified. No motor or sensory levels are noted. Crude visual fields are within  normal range.  RADIOLOGY RESULTS: CT scan and serial scans reviewed compatible with above-stated findings  PLAN: The present time patient is doing well CT scans are stable over time.  I am pleased with her overall progress.  I have asked to see her back in 1 year with a repeat CT scan at that time.  Patient comprehends well.  Patient is to call with any concerns.  I would like to take this opportunity to thank you for allowing me to participate in the care of your patient.SABRA [name], MD

## 2024-01-04 ENCOUNTER — Ambulatory Visit: Admitting: Family Medicine

## 2024-01-04 ENCOUNTER — Encounter: Payer: Self-pay | Admitting: Family Medicine

## 2024-01-04 VITALS — BP 125/83 | HR 88 | Temp 97.5°F | Ht 63.0 in | Wt 145.4 lb

## 2024-01-04 DIAGNOSIS — M329 Systemic lupus erythematosus, unspecified: Secondary | ICD-10-CM

## 2024-01-04 DIAGNOSIS — J4489 Other specified chronic obstructive pulmonary disease: Secondary | ICD-10-CM | POA: Diagnosis not present

## 2024-01-04 DIAGNOSIS — E782 Mixed hyperlipidemia: Secondary | ICD-10-CM | POA: Diagnosis not present

## 2024-01-04 DIAGNOSIS — E559 Vitamin D deficiency, unspecified: Secondary | ICD-10-CM

## 2024-01-04 DIAGNOSIS — F411 Generalized anxiety disorder: Secondary | ICD-10-CM

## 2024-01-04 DIAGNOSIS — Z23 Encounter for immunization: Secondary | ICD-10-CM

## 2024-01-04 DIAGNOSIS — D692 Other nonthrombocytopenic purpura: Secondary | ICD-10-CM

## 2024-01-04 DIAGNOSIS — K219 Gastro-esophageal reflux disease without esophagitis: Secondary | ICD-10-CM

## 2024-01-04 MED ORDER — VITAMIN D (ERGOCALCIFEROL) 1.25 MG (50000 UNIT) PO CAPS: 50000.0000 [IU] | ORAL_CAPSULE | ORAL | 1 refills | Status: AC

## 2024-01-04 MED ORDER — ALBUTEROL SULFATE HFA 108 (90 BASE) MCG/ACT IN AERS: 1.0000 | INHALATION_SPRAY | Freq: Four times a day (QID) | RESPIRATORY_TRACT | 6 refills | Status: AC | PRN

## 2024-01-04 MED ORDER — MONTELUKAST SODIUM 10 MG PO TABS: ORAL_TABLET | ORAL | 1 refills | Status: AC

## 2024-01-04 MED ORDER — OMEPRAZOLE 20 MG PO CPDR: 20.0000 mg | DELAYED_RELEASE_CAPSULE | Freq: Every day | ORAL | 1 refills | Status: AC

## 2024-01-04 MED ORDER — CLONAZEPAM 1 MG PO TABS: 1.0000 mg | ORAL_TABLET | Freq: Two times a day (BID) | ORAL | 2 refills | Status: AC | PRN

## 2024-01-04 MED ORDER — LIDOCAINE 5 % EX OINT: 1.0000 | TOPICAL_OINTMENT | CUTANEOUS | 3 refills | Status: AC | PRN

## 2024-01-04 NOTE — Assessment & Plan Note (Signed)
 Rechecking labs today. Await results. Treat as needed.

## 2024-01-04 NOTE — Assessment & Plan Note (Signed)
 Under good control on current regimen. Continue current regimen. Continue to monitor. Call with any concerns. Refills given for 3 months. Follow up 3 months.

## 2024-01-04 NOTE — Assessment & Plan Note (Signed)
Continue to follow with rheumatology. Call with any concerns.  

## 2024-01-04 NOTE — Assessment & Plan Note (Signed)
 Reassured patient. Continue to monitor.

## 2024-01-04 NOTE — Assessment & Plan Note (Signed)
 Under good control on current regimen. Continue current regimen. Continue to monitor. Call with any concerns. Refills given.

## 2024-01-04 NOTE — Progress Notes (Signed)
 BP 125/83   Pulse 88   Temp (!) 97.5 F (36.4 C) (Oral)   Ht 5' 3 (1.6 m)   Wt 145 lb 6.4 oz (66 kg)   SpO2 95%   BMI 25.76 kg/m    Subjective:    Patient ID: Maureen [name], female    DOB: 12/13/1945, 78 y.o.   MRN: [id number]  HPI: Maureen [name] is a 78 y.o. female  Chief Complaint  Patient presents with   Anxiety   HYPERLIPIDEMIA Hyperlipidemia status: excellent compliance Satisfied with current treatment?  yes Side effects:  no Medication compliance: excellent compliance Past cholesterol meds: zetia  Supplements: none Aspirin :  no The 10-year ASCVD risk score (Arnett DK, et al., 2019) is: 25.1%   Values used to calculate the score:     Age: 44 years     Clincally relevant sex: Female     Is Non-Hispanic African American: No     Diabetic: No     Tobacco smoker: Yes     Systolic Blood Pressure: 125 mmHg     Is BP treated: No     HDL Cholesterol: 46 mg/dL     Total Cholesterol: 193 mg/dL Chest pain:  no Coronary artery disease:  no Family history CAD:  yes  ANXIETY/STRESS Duration: chronic Status:controlled Anxious mood: no  Excessive worrying: no Irritability: no  Sweating: no Nausea: no Palpitations:no Hyperventilation: no Panic attacks: no Agoraphobia: no  Obscessions/compulsions: no Depressed mood: no    12/24/2023   10:31 AM 07/09/2023   10:23 AM 07/09/2023    9:55 AM 05/30/2022   12:10 PM 02/08/2022   10:25 AM  Depression screen PHQ 2/9  Decreased Interest 0 0 0 0 0  Down, Depressed, Hopeless 0 0 0 0 0  PHQ - 2 Score 0 0 0 0 0  Altered sleeping  0 0 0 0  Tired, decreased energy  0 0 0 0  Change in appetite  0 0 0 0  Feeling bad or failure about yourself   0 0 0 0  Trouble concentrating  0 0 0 0  Moving slowly or fidgety/restless  0 0 0 0  Suicidal thoughts  0 0 0 0  PHQ-9 Score  0 0 0 0  Difficult doing work/chores    Not difficult at all       07/09/2023    9:55 AM 02/08/2022   10:25 AM 11/08/2021    2:53 PM 08/08/2021     2:01 PM  GAD 7 : Generalized Anxiety Score  Nervous, Anxious, on Edge 0 0 2 1  Control/stop worrying 0 0 2 1  Worry too much - different things 0 0 2 1  Trouble relaxing 0 0 2 0  Restless 0 0 2 1  Easily annoyed or irritable 0 0 1 1  Afraid - awful might happen 0 0 0 1  Total GAD 7 Score 0 0 11 6  Anxiety Difficulty  Not difficult at all  Not difficult at all   Anhedonia: no Weight changes: no Insomnia: no   Hypersomnia: no Fatigue/loss of energy: no Feelings of worthlessness: no Feelings of guilt: no Impaired concentration/indecisiveness: no Suicidal ideations: no  Crying spells: no Recent Stressors/Life Changes: no   Relationship problems: no   Family stress: no     Financial stress: no    Job stress: no    Recent death/loss: no  Relevant past medical, surgical, family and social history reviewed and updated as indicated. Interim medical history  since our last visit reviewed. Allergies and medications reviewed and updated.  Review of Systems  Constitutional: Negative.   Respiratory: Negative.    Cardiovascular: Negative.   Gastrointestinal: Negative.   Musculoskeletal: Negative.   Neurological: Negative.   Psychiatric/Behavioral: Negative.      Per HPI unless specifically indicated above     Objective:    BP 125/83   Pulse 88   Temp (!) 97.5 F (36.4 C) (Oral)   Ht 5' 3 (1.6 m)   Wt 145 lb 6.4 oz (66 kg)   SpO2 95%   BMI 25.76 kg/m   Wt Readings from Last 3 Encounters:  01/04/24 145 lb 6.4 oz (66 kg)  12/24/23 147 lb 8 oz (66.9 kg)  10/31/23 143 lb (64.9 kg)    Physical Exam Vitals and nursing note reviewed.  Constitutional:      General: She is not in acute distress.    Appearance: Normal appearance. She is not ill-appearing, toxic-appearing or diaphoretic.  HENT:     Head: Normocephalic and atraumatic.     Right Ear: External ear normal.     Left Ear: External ear normal.     Nose: Nose normal.     Mouth/Throat:     Mouth: Mucous membranes  are moist.     Pharynx: Oropharynx is clear.  Eyes:     General: No scleral icterus.       Right eye: No discharge.        Left eye: No discharge.     Extraocular Movements: Extraocular movements intact.     Conjunctiva/sclera: Conjunctivae normal.     Pupils: Pupils are equal, round, and reactive to light.  Cardiovascular:     Rate and Rhythm: Normal rate and regular rhythm.     Pulses: Normal pulses.     Heart sounds: Normal heart sounds. No murmur heard.    No friction rub. No gallop.  Pulmonary:     Effort: Pulmonary effort is normal. No respiratory distress.     Breath sounds: Normal breath sounds. No stridor. No wheezing, rhonchi or rales.  Chest:     Chest wall: No tenderness.  Musculoskeletal:        General: Normal range of motion.     Cervical back: Normal range of motion and neck supple.  Skin:    General: Skin is warm and dry.     Capillary Refill: Capillary refill takes less than 2 seconds.     Coloration: Skin is not jaundiced or pale.     Findings: No bruising, erythema, lesion or rash.  Neurological:     General: No focal deficit present.     Mental Status: She is alert and oriented to person, place, and time. Mental status is at baseline.  Psychiatric:        Mood and Affect: Mood normal.        Behavior: Behavior normal.        Thought Content: Thought content normal.        Judgment: Judgment normal.     Results for orders placed or performed during the hospital encounter of 12/10/23  I-STAT creatinine   Collection Time: 12/10/23  9:56 AM  Result Value Ref Range   Creatinine, Ser 1.50 (H) 0.44 - 1.00 mg/dL      Assessment & Plan:   Problem List Items Addressed This Visit       Cardiovascular and Mediastinum   Senile purpura   Reassured patient. Continue to monitor.  Respiratory   COPD (chronic obstructive pulmonary disease) with chronic bronchitis (HCC)   Under good control on current regimen. Continue current regimen. Continue to  monitor. Call with any concerns. Refills given.       Relevant Medications   montelukast  (SINGULAIR ) 10 MG tablet   albuterol  (VENTOLIN  HFA) 108 (90 Base) MCG/ACT inhaler   Other Relevant Orders   CBC with Differential/Platelet     Digestive   GERD (gastroesophageal reflux disease)   Under good control on current regimen. Continue current regimen. Continue to monitor. Call with any concerns. Refills given. Labs drawn today.        Relevant Medications   omeprazole  (PRILOSEC) 20 MG capsule     Other   Systemic lupus erythematosus (HCC)   Continue to follow with rheumatology. Call with any concerns.       Generalized anxiety disorder - Primary   Under good control on current regimen. Continue current regimen. Continue to monitor. Call with any concerns. Refills given for 3 months. Follow up 3 months.        Hyperlipidemia   Under good control on current regimen. Continue current regimen. Continue to monitor. Call with any concerns. Refills given. Labs drawn today.        Relevant Orders   Comprehensive metabolic panel with GFR   CBC with Differential/Platelet   Lipid Panel w/o Chol/HDL Ratio   Vitamin D  deficiency   Rechecking labs today. Await results. Treat as needed.       Relevant Orders   VITAMIN D  25 Hydroxy (Vit-D Deficiency, Fractures)   Other Visit Diagnoses       Needs flu shot       Flu shot given today.   Relevant Orders   Flu vaccine HIGH DOSE PF(Fluzone Trivalent) (Completed)        Follow up plan: Return in about 3 months (around 04/05/2024).

## 2024-01-04 NOTE — Assessment & Plan Note (Signed)
 Under good control on current regimen. Continue current regimen. Continue to monitor. Call with any concerns. Refills given. Labs drawn today.

## 2024-01-05 LAB — VITAMIN D 25 HYDROXY (VIT D DEFICIENCY, FRACTURES): Vit D, 25-Hydroxy: 39.5 ng/mL (ref 30.0–100.0)

## 2024-01-05 LAB — CBC WITH DIFFERENTIAL/PLATELET
Basophils Absolute: 0 x10E3/uL (ref 0.0–0.2)
Basos: 1 %
EOS (ABSOLUTE): 0.2 x10E3/uL (ref 0.0–0.4)
Eos: 3 %
Hematocrit: 37.5 % (ref 34.0–46.6)
Hemoglobin: 12.3 g/dL (ref 11.1–15.9)
Immature Grans (Abs): 0 x10E3/uL (ref 0.0–0.1)
Immature Granulocytes: 0 %
Lymphocytes Absolute: 2.2 x10E3/uL (ref 0.7–3.1)
Lymphs: 34 %
MCH: 31.1 pg (ref 26.6–33.0)
MCHC: 32.8 g/dL (ref 31.5–35.7)
MCV: 95 fL (ref 79–97)
Monocytes Absolute: 0.4 x10E3/uL (ref 0.1–0.9)
Monocytes: 7 %
Neutrophils Absolute: 3.7 x10E3/uL (ref 1.4–7.0)
Neutrophils: 55 %
Platelets: 198 x10E3/uL (ref 150–450)
RBC: 3.96 x10E6/uL (ref 3.77–5.28)
RDW: 14.1 % (ref 11.7–15.4)
WBC: 6.5 x10E3/uL (ref 3.4–10.8)

## 2024-01-05 LAB — COMPREHENSIVE METABOLIC PANEL WITH GFR
ALT: 16 IU/L (ref 0–32)
AST: 16 IU/L (ref 0–40)
Albumin: 4.3 g/dL (ref 3.8–4.8)
Alkaline Phosphatase: 119 IU/L (ref 49–135)
BUN/Creatinine Ratio: 13 (ref 12–28)
BUN: 18 mg/dL (ref 8–27)
Bilirubin Total: <0.2 mg/dL (ref 0.0–1.2)
CO2: 23 mmol/L (ref 20–29)
Calcium: 10.2 mg/dL (ref 8.7–10.3)
Chloride: 105 mmol/L (ref 96–106)
Creatinine, Ser: 1.39 mg/dL — ABNORMAL HIGH (ref 0.57–1.00)
Globulin, Total: 2.2 g/dL (ref 1.5–4.5)
Glucose: 85 mg/dL (ref 70–99)
Potassium: 4.5 mmol/L (ref 3.5–5.2)
Sodium: 140 mmol/L (ref 134–144)
Total Protein: 6.5 g/dL (ref 6.0–8.5)
eGFR: 39 mL/min/1.73 — ABNORMAL LOW (ref >59)

## 2024-01-05 LAB — LIPID PANEL W/O CHOL/HDL RATIO
Cholesterol, Total: 192 mg/dL (ref 100–199)
HDL: 45 mg/dL (ref >39)
LDL Chol Calc (NIH): 130 mg/dL — ABNORMAL HIGH (ref 0–99)
Triglycerides: 93 mg/dL (ref 0–149)
VLDL Cholesterol Cal: 17 mg/dL (ref 5–40)

## 2024-01-06 ENCOUNTER — Ambulatory Visit: Payer: Self-pay | Admitting: Family Medicine

## 2024-01-10 ENCOUNTER — Ambulatory Visit: Admitting: Family Medicine

## 2024-01-14 ENCOUNTER — Ambulatory Visit
Admission: EM | Admit: 2024-01-14 | Discharge: 2024-01-14 | Disposition: A | Discharge status: Home / Self Care | Attending: Family Medicine | Admitting: Family Medicine

## 2024-01-14 ENCOUNTER — Ambulatory Visit: Payer: Self-pay | Admitting: Family Medicine

## 2024-01-14 ENCOUNTER — Ambulatory Visit: Payer: Self-pay

## 2024-01-14 ENCOUNTER — Encounter: Payer: Self-pay | Admitting: Emergency Medicine

## 2024-01-14 DIAGNOSIS — J22 Unspecified acute lower respiratory infection: Secondary | ICD-10-CM | POA: Insufficient documentation

## 2024-01-14 DIAGNOSIS — B9789 Other viral agents as the cause of diseases classified elsewhere: Secondary | ICD-10-CM | POA: Diagnosis present

## 2024-01-14 DIAGNOSIS — J441 Chronic obstructive pulmonary disease with (acute) exacerbation: Secondary | ICD-10-CM | POA: Diagnosis not present

## 2024-01-14 LAB — RESP PANEL BY RT-PCR (RSV, FLU A&B, COVID)  RVPGX2
Influenza A by PCR: NEGATIVE
Influenza B by PCR: NEGATIVE
Resp Syncytial Virus by PCR: NEGATIVE
SARS Coronavirus 2 by RT PCR: NEGATIVE

## 2024-01-14 MED ORDER — PREDNISONE 50 MG PO TABS: 50.0000 mg | ORAL_TABLET | Freq: Every day | ORAL | 0 refills | Status: AC

## 2024-01-14 MED ORDER — IPRATROPIUM BROMIDE 0.02 % IN SOLN: 0.5000 mg | Freq: Four times a day (QID) | RESPIRATORY_TRACT | 12 refills | Status: AC

## 2024-01-14 MED ORDER — IPRATROPIUM BROMIDE 0.02 % IN SOLN
0.5000 mg | Freq: Once | RESPIRATORY_TRACT | Status: AC
  Administered 2024-01-14: 0.5 mg via RESPIRATORY_TRACT

## 2024-01-14 MED ORDER — CEFDINIR 300 MG PO CAPS
300.0000 mg | ORAL_CAPSULE | Freq: Two times a day (BID) | ORAL | 0 refills | Status: AC
Start: 2024-01-14 — End: ?

## 2024-01-14 MED ORDER — LEVALBUTEROL HCL 0.31 MG/3ML IN NEBU: 1.0000 | INHALATION_SOLUTION | Freq: Four times a day (QID) | RESPIRATORY_TRACT | 12 refills | Status: AC | PRN

## 2024-01-14 MED ORDER — PROMETHAZINE-DM 6.25-15 MG/5ML PO SYRP: 5.0000 mL | ORAL_SOLUTION | Freq: Four times a day (QID) | ORAL | 0 refills | Status: DC | PRN

## 2024-01-14 NOTE — Telephone Encounter (Signed)
 Scheduled

## 2024-01-14 NOTE — Telephone Encounter (Signed)
 FYI Only or Action Required?: Action required by provider: Medication for symptoms.  Patient was last seen in primary care on 01/04/2024 by Vicci Duwaine SQUIBB, DO.  Called Nurse Triage reporting Cough.  Symptoms began a week ago.  Interventions attempted: OTC medications: Mucinex, Tylenol , and Zyrtec.  Symptoms are: gradually worsening.  Triage Disposition: See Physician Within 24 Hours  Patient/caregiver understands and will follow disposition?: No, wishes to speak with PCP      Message from [facility] sent at 01/14/2024  8:21 AM EDT  Summary: congestion / rx req   Reason for Triage: The patient shares that they are currently experiencing cough and congestion. The patient has experienced discomfort for roughly 1 week since participating in a family gathering. The patient would like to prescribed something to clear up their congestion or maybe a nebulizer treatment. Please contact further when possible         Reason for Disposition  SEVERE coughing spells (e.g., whooping sound after coughing, vomiting after coughing)  Answer Assessment - Initial Assessment Questions Patient currently taking mucinex, Tylenol , and Zyrtec for symptoms. She states lying down makes symptoms worse. This RN offered the patient an appointment in office and she declined. Patient requesting a nebulizer treatment to be called in for her. Patient also requesting medication for her runny nose symptom. Patient requesting a call back regarding her request. Preferred pharmacy below.   [id number] - [place], [place] - [address]  [address], [place] [address]     1. ONSET: When did the cough begin?      1 week ago  2. SEVERITY: How bad is the cough today?      States the cough is more than moderate  3. SPUTUM: Describe the color of your sputum (e.g., none, dry cough; clear, white, yellow, green)     Clear and light green in color  4. HEMOPTYSIS: Are you coughing up any blood? If Yes, ask:  How much? (e.g., flecks, streaks, tablespoons, etc.)     Denies  5. DIFFICULTY BREATHING: Are you having difficulty breathing? If Yes, ask: How bad is it? (e.g., mild, moderate, severe)      Patient states she is unsure but has some difficulty breathing on exertion.  6. FEVER: Do you have a fever? If Yes, ask: What is your temperature, how was it measured, and when did it start?     Denies  7. OTHER SYMPTOMS: Do you have any other symptoms? (e.g., runny nose, wheezing, chest pain)       Runny nose, chest congestion  Protocols used: Cough - Acute Productive-A-AH

## 2024-01-14 NOTE — Telephone Encounter (Signed)
 appt

## 2024-01-14 NOTE — ED Triage Notes (Signed)
 Pt presents with a productive cough, runny nose, chills and headache x 1 week. Pt has taken OTC cold medication for her symptoms.

## 2024-01-14 NOTE — Discharge Instructions (Addendum)
 Stop by the pharmacy to pick up your prescriptions.  Follow up with your primary care provider or return to the urgent care, if not improving.   Use the Levalbuterol with the ipratropium every 6 hours as needed for cough and shortness of breath. Take your prednisone  as prescribed.  Use the promethazine cough syrup as needed for cough. This medication can cause drowsiness.   I will call you if your COVID, influenza or RSV test is positive.

## 2024-01-14 NOTE — ED Provider Notes (Signed)
 [facility]    CSN: [id number] Arrival date & time: 01/14/24  1553      History   Chief Complaint Chief Complaint  Patient presents with   Cough   Nasal Congestion   Headache    HPI Maureen [name] is a 78 y.o. female.   HPI  History obtained from the patient. [name] presents for productive cough, chest congestion, rhinorrhea, nasal congestion, headache, vomiting for the past week. Has some post-tussive emesis. Taking Tylenol . Several family members and neighbors have been sick with similar symptoms. Someone had the flu.     Smokes cigarettes daily.  Has COPD and lung cancer s/p radiation treatment in the past 2 years.    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 This was a histologic grade 2 tumor measuring just under 1 cm in diameter. No invasive cancer was identified. ER 90%, PR 40%.   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   Bronchitis    COPD (chronic obstructive pulmonary disease) (HCC)    Discoid lupus 2005   Emphysematous bleb (HCC)    GERD (gastroesophageal reflux disease)    Heart murmur 2005   Hypercholesterolemia 2012   Lung cancer [facility])    Mammographic microcalcification 2011   Panic attack    Personal history of chemotherapy 1994   Personal history of colonic polyps    Colon polyps   Personal history of other malignant neoplasm of skin 2015   basal cell   Personal history of tobacco use, presenting hazards to health    PUD (peptic ulcer disease)    Last EGD 1/09   Pulmonary nodule    Rectal polyp 2008   villous adenoma; last colonoscopy 1/09 WNL   Skin cancer,  basal cell 2015   right cheek [facility]s surgery at [facility]   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, back   Tobacco use    Ulcer     Patient Active Problem List   Diagnosis Date Noted   Senile purpura 07/09/2023   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   Fibrothecoma, 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   Fibroma of right ovary 11/09/2021   Adnexal mass 08/29/2021   Atherosclerosis of native arteries of extremity with intermittent claudication 10/22/2020   Abdominal aortic aneurysm (AAA) without rupture 11/11/2018   History of breast cancer 11/11/2018   COPD (chronic obstructive pulmonary disease) with chronic bronchitis (HCC) 06/19/2018   Skin lesion of chest wall 06/15/2016   Vitamin D  deficiency 05/29/2016   Tobacco abuse 05/25/2016   Controlled substance agreement signed 05/25/2016   Advance care planning 05/25/2016   History of colonic polyps 06/12/2014   GERD (gastroesophageal reflux disease) 06/12/2014   Hyperlipidemia 05/11/2014   Systemic lupus erythematosus (HCC) 04/16/2013   Generalized anxiety disorder 04/16/2013   Personal history of other malignant neoplasm of skin 03/29/2012    Past Surgical History:  Procedure Laterality Date   APPENDECTOMY  2011   BREAST BIOPSY  12/27/2009   right breast   CARDIAC CATHETERIZATION  1996   Negative Cath   COLONOSCOPY N/A 08/05/2014  Procedure: COLONOSCOPY;  Surgeon: Reyes LELON Cota, MD;  Location: [facility] ENDOSCOPY;  EGD, tubular adenoma 1.   COLONOSCOPY W/ BIOPSIES  2005,2009   Dr. Cota. Normal exam 2009. Villous adenoma of the rectum in 2005 adenomatous polyp from the ascending colon and rectum in 2002.   ESOPHAGOGASTRODUODENOSCOPY N/A 08/05/2014   Procedure: ESOPHAGOGASTRODUODENOSCOPY (EGD);  Surgeon: Reyes LELON Cota, MD;  [facility] END[facility]COPY;  Service: Endoscopy;  Laterality: N/A;   ESOPHAGOGASTRODUODENOSCOPY (EGD) WITH PROPOFOL  N/A 07/25/2017   Procedure: ESOPHAGOGASTRODUODENOSCOPY (EGD) WITH PROPOFOL ;   Surgeon: Cota Reyes LELON, MD;  Location: [facility] ENDOSCOPY;  Service: Endoscopy;  Laterality: N/A;   ESOPHAGOGASTRODUODENOSCOPY (EGD) WITH PROPOFOL  N/A 08/03/2021   Procedure: ESOPHAGOGASTRODUODENOSCOPY (EGD) WITH PROPOFOL ;  Surgeon: Cota Reyes LELON, MD;  Location: [facility] ENDOSCOPY;  Service: Endoscopy;  Laterality: N/A;   LAPAROSCOPIC BILATERAL SALPINGO OOPHERECTOMY Bilateral 10/05/2021   Procedure: LAPAROSCOPIC BILATERAL SALPINGO OOPHORECTOMY, REMOVAL OF PELVIC MASSES, MINI LAPAROTOMY;  Surgeon: Mancil Barter, MD;  Location: [facility] ORS;  Service: Gynecology;  Laterality: Bilateral;   MASTECTOMY     bilateral   SIMPLE MASTECTOMY  1993   right   SIMPLE MASTECTOMY  2011   left   TONSILLECTOMY  1997   UPPER GI ENDOSCOPY  2003, 2005, 2009, 2012, 2014   2012 showed mild reflux changes without evidence of Barrett's epithelial changes 2014 biopsies at 37 cm showed changes suggestive of reflux esophagitis. No metaplasia.   VAGINAL HYSTERECTOMY  1977    OB History     Gravida  2   Para  2   Term  2   Preterm      AB      Living  2      SAB      IAB      Ectopic      Multiple      Live Births           Obstetric Comments  Age with first menstruation-15 Age with first pregnancy-24 LMP-1977, hysterectomy          Home Medications    Prior to Admission medications   Medication Sig Start Date End Date Taking? Authorizing Provider  ipratropium (ATROVENT) 0.02 % nebulizer solution Take 2.5 mLs (0.5 mg total) by nebulization 4 (four) times daily. 01/14/24  Yes Christopherjohn Schiele, DO  levalbuterol (XOPENEX) 0.31 MG/3ML nebulizer solution Take 3 mLs (0.31 mg total) by nebulization every 6 (six) hours as needed for wheezing. 01/14/24  Yes Euell Schiff, DO  predniSONE  (DELTASONE ) 50 MG tablet Take 1 tablet (50 mg total) by mouth daily for 5 days. 01/14/24 01/19/24 Yes Claron Rosencrans, DO  promethazine-dextromethorphan (PROMETHAZINE-DM) 6.25-15 MG/5ML syrup Take 5 mLs by mouth  4 (four) times daily as needed. 01/14/24  Yes Isaih Bulger, DO  acetaminophen  (TYLENOL ) 325 MG tablet Take 650 mg by mouth every 6 (six) hours as needed.    [provider]  albuterol  (VENTOLIN  HFA) 108 (90 Base) MCG/ACT inhaler Inhale 1-2 puffs into the lungs every 6 (six) hours as needed for wheezing or shortness of breath. 01/04/24   Johnson, Megan P, DO  Ascorbic Acid (VITAMIN C) 100 MG tablet Take 100 mg by mouth daily.    [provider]  cefdinir (OMNICEF) 300 MG capsule Take 1 capsule (300 mg total) by mouth 2 (two) times daily. 01/14/24   Kellsie Grindle, DO  cetirizine (ZYRTEC ALLERGY) 10 MG tablet Take 10 mg by mouth every morning.    [provider]  Cholecalciferol (VITAMIN D3) 50 MCG (2000 UT)  TABS Take 1 tablet by mouth daily at 6 (six) AM.    [provider]  clonazePAM  (KLONOPIN ) 1 MG tablet Take 1 tablet (1 mg total) by mouth 2 (two) times daily as needed for anxiety. 01/04/24   Johnson, Megan P, DO  ezetimibe  (ZETIA ) 10 MG tablet Take 1 tablet (10 mg total) by mouth daily. 12/11/22   Pietro Redell RAMAN, MD  fluticasone (FLONASE) 50 MCG/ACT nasal spray Place 2 sprays into the nose every morning.    [provider]  KRILL OIL PO Take by mouth daily.    [provider]  lidocaine  (XYLOCAINE ) 5 % ointment Apply 1 Application topically as needed. 01/04/24   Johnson, Megan P, DO  montelukast  (SINGULAIR ) 10 MG tablet TAKE 1 TABLET BY MOUTH EVERYDAY AT BEDTIME 01/04/24   Johnson, Megan P, DO  omeprazole  (PRILOSEC) 20 MG capsule Take 1 capsule (20 mg total) by mouth daily. 01/04/24   Vicci Bouchard P, DO  Ruxolitinib Phosphate  (OPZELURA ) 1.5 % CREA Apply to affected skin qd 05/30/23   Claudene Lehmann, MD  Vitamin D , Ergocalciferol , (DRISDOL ) 1.25 MG (50000 UNIT) CAPS capsule Take 1 capsule (50,000 Units total) by mouth every 7 (seven) days. 01/04/24   Johnson, Megan P, DO  vitamin E 100 UNIT capsule Take 100 Units by mouth daily.     [provider]  zinc gluconate 50 MG tablet Take 50 mg by mouth as needed.    [provider]    Family History Family History  Problem Relation Age of Onset   Heart attack Father    Cancer Father        esophagus   Cancer Maternal Grandmother        breast cancer   Cancer Maternal Aunt        breast cancer    Social History Social History   Tobacco Use   Smoking status: Every Day    Current packs/day: 0.50    Average packs/day: 0.5 packs/day for 40.0 years (20.0 ttl pk-yrs)    Types: Cigarettes   Smokeless tobacco: Never   Tobacco comments:    Pt wants to do hypnotizing  Vaping Use   Vaping status: Never Used  Substance Use Topics   Alcohol use: Yes    Alcohol/week: 1.0 standard drink of alcohol    Types: 1 Cans of beer per week    Comment: 1 beer weekly   Drug use: No     Allergies   Sulfa antibiotics, Doxycycline , Codeine, Incruse ellipta  [umeclidinium bromide ], Sulfinpyrazone, Erythromycin, and Penicillins   Review of Systems Review of Systems: negative unless otherwise stated in HPI.      Physical Exam Triage Vital Signs ED Triage Vitals  Encounter Vitals Group     BP      Girls Systolic BP Percentile      Girls Diastolic BP Percentile      Boys Systolic BP Percentile      Boys Diastolic BP Percentile      Pulse      Resp      Temp      Temp src      SpO2      Weight      Height      Head Circumference      Peak Flow      Pain Score      Pain Loc      Pain Education      Exclude from Growth Chart    No data  found.  Updated Vital Signs BP (!) 154/78 (BP Location: Right Arm)   Pulse 99   Temp 98.6 F (37 C) (Oral)   Resp 17   Wt 64.9 kg   SpO2 96%   BMI 25.33 kg/m   Visual Acuity Right Eye Distance:   Left Eye Distance:   Bilateral Distance:    Right Eye Near:   Left Eye Near:    Bilateral Near:     Physical Exam GEN:     alert, non-toxic appearing female in no distress    HENT:  mucus membranes moist,  clear nasal discharge EYES:   no scleral injection or discharge RESP:  Decreased air movement, coarse breathe sounds bilaterally, mild expiratory wheezing, no tachypnea CVS:   regular rate and rhythm Skin:   warm and dry    UC Treatments / Results  Labs (all labs ordered are listed, but only abnormal results are displayed) Labs Reviewed  RESP PANEL BY RT-PCR (RSV, FLU A&B, COVID)  [id number]    EKG   Radiology No results found.   Procedures Procedures (including critical care time)  Medications Ordered in UC Medications  ipratropium (ATROVENT) nebulizer solution 0.5 mg (0.5 mg Nebulization Given 01/14/24 1649)    Initial Impression / Assessment and Plan / UC Course  I have reviewed the triage vital signs and the nursing notes.  Pertinent labs & imaging results that were available during my care of the patient were reviewed by me and considered in my medical decision making (see chart for details).       Pt is a overall well appearing 78 year old female with a history of lung cancer and COPD here for acute exacerbation. She is normotensive, afebrile and satting well on room air. She smokes cigarettes daily.  On exam, she has decreased air movement, coarse breathe sounds with mild expiratory wheezing.  -Treat COPD exacerbation including prednisone  50 mg/day for the next 5 days with Cefidinir  - Pt prefers levalbuterol nebulizers as they don't make her heart race. Rx sent to pharmacy.  Ipratropium nebulizer also prescribed. She was given a ipratropium neb here with good response.   - Promethazine DM for cough.   -Provided return precautions to patient that if symptoms worsen, if shortness of breath increases, if pt develops chest pain, high fevers, or other concerning symptoms to have a low threshold to go to the emergency department  -Recommended patient follow-up with PCP if symptoms do not improve over the next 1 week     Final Clinical Impressions(s) / UC Diagnoses    Final diagnoses:  Viral lower respiratory tract infection  COPD exacerbation [facility])     Discharge Instructions      Stop by the pharmacy to pick up your prescriptions.  Follow up with your primary care provider or return to the urgent care, if not improving.   Use the Levalbuterol with the ipratropium every 6 hours as needed for cough and shortness of breath. Take your prednisone  as prescribed.  Use the promethazine cough syrup as needed for cough. This medication can cause drowsiness.   I will call you if your COVID, influenza or RSV test is positive.      ED Prescriptions     Medication Sig Dispense Auth. Provider   ipratropium (ATROVENT) 0.02 % nebulizer solution Take 2.5 mLs (0.5 mg total) by nebulization 4 (four) times daily. 75 mL Arpan Eskelson, DO   levalbuterol (XOPENEX) 0.31 MG/3ML nebulizer solution Take 3 mLs (0.31 mg total) by nebulization every  6 (six) hours as needed for wheezing. 3 mL Liam Bossman, DO   predniSONE  (DELTASONE ) 50 MG tablet Take 1 tablet (50 mg total) by mouth daily for 5 days. 5 tablet Inesha Sow, DO   promethazine-dextromethorphan (PROMETHAZINE-DM) 6.25-15 MG/5ML syrup Take 5 mLs by mouth 4 (four) times daily as needed. 118 mL Asaph Serena, DO      PDMP not reviewed this encounter.   Tasnim Balentine, DO 01/14/24 2000

## 2024-01-14 NOTE — Telephone Encounter (Signed)
 Antibiotics sent to preferred pharmacy as they were left off of her discharge Rx's at her UC visit today.   Caprice Porteous, DO

## 2024-01-15 ENCOUNTER — Ambulatory Visit: Admitting: Pediatrics

## 2024-02-28 ENCOUNTER — Encounter: Payer: Self-pay | Admitting: Family Medicine

## 2024-02-28 ENCOUNTER — Ambulatory Visit: Admitting: Family Medicine

## 2024-02-28 VITALS — BP 144/81 | HR 96 | Temp 97.8°F | Ht 63.0 in | Wt 141.4 lb

## 2024-02-28 DIAGNOSIS — J4489 Other specified chronic obstructive pulmonary disease: Secondary | ICD-10-CM | POA: Diagnosis not present

## 2024-02-28 DIAGNOSIS — J189 Pneumonia, unspecified organism: Secondary | ICD-10-CM

## 2024-02-28 NOTE — Progress Notes (Signed)
 BP (!) 144/81 (BP Location: Left Arm, Cuff Size: Normal)   Pulse 96   Temp 97.8 F (36.6 C) (Oral)   Ht 5' 3 (1.6 m)   Wt 141 lb 6.4 oz (64.1 kg)   SpO2 95%   BMI 25.05 kg/m    Subjective:    Patient ID: Maureen [name], female    DOB: 11-28-1945, 78 y.o.   MRN: [id number]  HPI: Maureen [name] is a 78 y.o. female  Chief Complaint  Patient presents with   Follow-up    01/18/24 ED   ER FOLLOW UP Time since discharge: about 6 weeks Hospital/facility:  [facility] Diagnosis: pneumonia Procedures/tests: labs and CXR Consultants: none New medications: steroids, levaquin  Discharge instructions:  follow up here Status: better  Since getting out of the ER Pranika has been feeling better, but she has continued to be SOB when she's exerting herself and remains tired. No fevers. Coughing up a little phlegm.   Relevant past medical, surgical, family and social history reviewed and updated as indicated. Interim medical history since our last visit reviewed. Allergies and medications reviewed and updated.  Review of Systems  Per HPI unless specifically indicated above     Objective:    BP (!) 144/81 (BP Location: Left Arm, Cuff Size: Normal)   Pulse 96   Temp 97.8 F (36.6 C) (Oral)   Ht 5' 3 (1.6 m)   Wt 141 lb 6.4 oz (64.1 kg)   SpO2 95%   BMI 25.05 kg/m   Wt Readings from Last 3 Encounters:  02/28/24 141 lb 6.4 oz (64.1 kg)  01/14/24 143 lb (64.9 kg)  01/04/24 145 lb 6.4 oz (66 kg)    Physical Exam Vitals and nursing note reviewed.  Constitutional:      General: She is not in acute distress.    Appearance: Normal appearance. She is not ill-appearing, toxic-appearing or diaphoretic.  HENT:     Head: Normocephalic and atraumatic.     Right Ear: External ear normal.     Left Ear: External ear normal.     Nose: Nose normal.     Mouth/Throat:     Mouth: Mucous membranes are moist.     Pharynx: Oropharynx is clear.  Eyes:     General: No scleral  icterus.       Right eye: No discharge.        Left eye: No discharge.     Extraocular Movements: Extraocular movements intact.     Conjunctiva/sclera: Conjunctivae normal.     Pupils: Pupils are equal, round, and reactive to light.  Cardiovascular:     Rate and Rhythm: Normal rate and regular rhythm.     Pulses: Normal pulses.     Heart sounds: Normal heart sounds. No murmur heard.    No friction rub. No gallop.  Pulmonary:     Effort: Pulmonary effort is normal. No respiratory distress.     Breath sounds: No stridor. No wheezing, rhonchi or rales.     Comments: Lungs very tighta Chest:     Chest wall: No tenderness.  Musculoskeletal:        General: Normal range of motion.     Cervical back: Normal range of motion and neck supple.  Skin:    General: Skin is warm and dry.     Capillary Refill: Capillary refill takes less than 2 seconds.     Coloration: Skin is not jaundiced or pale.     Findings: No bruising, erythema,  lesion or rash.  Neurological:     General: No focal deficit present.     Mental Status: She is alert and oriented to person, place, and time. Mental status is at baseline.  Psychiatric:        Mood and Affect: Mood normal.        Behavior: Behavior normal.        Thought Content: Thought content normal.        Judgment: Judgment normal.     Results for orders placed or performed during the hospital encounter of 01/14/24  Resp panel by RT-PCR (RSV, Flu A&B, Covid) Anterior Nasal Swab   Collection Time: 01/14/24  4:27 PM   Specimen: Anterior Nasal Swab  Result Value Ref Range   SARS Coronavirus 2 by RT PCR NEGATIVE NEGATIVE   Influenza A by PCR NEGATIVE NEGATIVE   Influenza B by PCR NEGATIVE NEGATIVE   Resp Syncytial Virus by PCR NEGATIVE NEGATIVE      Assessment & Plan:   Problem List Items Addressed This Visit       Respiratory   COPD (chronic obstructive pulmonary disease) with chronic bronchitis (HCC)   Lungs very tight today. Better after  nebulizer. Due to see pulmonology Monday. Sample of breztri given today. Continue to monitor. Call with any concerns.       Other Visit Diagnoses       Pneumonia of right middle lobe due to infectious organism    -  Primary   Resolved. Lungs clear. Call with any concerns.        Follow up plan: Return for As scheduled.

## 2024-02-28 NOTE — Assessment & Plan Note (Signed)
 Lungs very tight today. Better after nebulizer. Due to see pulmonology Monday. Sample of breztri given today. Continue to monitor. Call with any concerns.

## 2024-03-03 ENCOUNTER — Ambulatory Visit
Admission: RE | Admit: 2024-03-03 | Discharge: 2024-03-03 | Disposition: A | Source: Ambulatory Visit | Attending: Pulmonary Disease

## 2024-03-03 ENCOUNTER — Other Ambulatory Visit: Payer: Self-pay | Admitting: Pulmonary Disease

## 2024-03-03 DIAGNOSIS — J449 Chronic obstructive pulmonary disease, unspecified: Secondary | ICD-10-CM

## 2024-03-03 DIAGNOSIS — R911 Solitary pulmonary nodule: Secondary | ICD-10-CM | POA: Diagnosis present

## 2024-04-21 ENCOUNTER — Ambulatory Visit: Admitting: Family Medicine

## 2024-05-05 ENCOUNTER — Ambulatory Visit: Admitting: Family Medicine

## 2024-07-10 ENCOUNTER — Ambulatory Visit

## 2024-12-15 ENCOUNTER — Other Ambulatory Visit

## 2024-12-29 ENCOUNTER — Ambulatory Visit: Admitting: Radiation Oncology
